# Patient Record
Sex: Male | Born: 1940 | Race: White | Hispanic: No | Marital: Married | Smoking: Former smoker
Health system: Southern US, Community
[De-identification: ages and names within clinical notes are randomized; demographics above are authoritative.]

## PROBLEM LIST (undated history)

## (undated) DIAGNOSIS — K259 Gastric ulcer, unspecified as acute or chronic, without hemorrhage or perforation: Secondary | ICD-10-CM

## (undated) DIAGNOSIS — I2699 Other pulmonary embolism without acute cor pulmonale: Secondary | ICD-10-CM

## (undated) DIAGNOSIS — G4733 Obstructive sleep apnea (adult) (pediatric): Secondary | ICD-10-CM

## (undated) DIAGNOSIS — R7303 Prediabetes: Secondary | ICD-10-CM

## (undated) DIAGNOSIS — L570 Actinic keratosis: Secondary | ICD-10-CM

## (undated) DIAGNOSIS — R609 Edema, unspecified: Secondary | ICD-10-CM

## (undated) DIAGNOSIS — J302 Other seasonal allergic rhinitis: Secondary | ICD-10-CM

## (undated) DIAGNOSIS — Z9989 Dependence on other enabling machines and devices: Secondary | ICD-10-CM

## (undated) DIAGNOSIS — E785 Hyperlipidemia, unspecified: Secondary | ICD-10-CM

## (undated) DIAGNOSIS — I2724 Chronic thromboembolic pulmonary hypertension: Secondary | ICD-10-CM

## (undated) HISTORY — DX: Obstructive sleep apnea (adult) (pediatric): G47.33

## (undated) HISTORY — DX: Obstructive sleep apnea (adult) (pediatric): Z99.89

## (undated) HISTORY — DX: Edema, unspecified: R60.9

## (undated) HISTORY — DX: Chronic thromboembolic pulmonary hypertension: I27.24

## (undated) HISTORY — DX: Prediabetes: R73.03

## (undated) HISTORY — DX: Other pulmonary embolism without acute cor pulmonale: I26.99

## (undated) HISTORY — PX: TONSILLECTOMY: SUR1361

## (undated) HISTORY — DX: Actinic keratosis: L57.0

## (undated) HISTORY — DX: Other seasonal allergic rhinitis: J30.2

## (undated) HISTORY — DX: Hyperlipidemia, unspecified: E78.5

---

## 2013-05-08 DIAGNOSIS — G4733 Obstructive sleep apnea (adult) (pediatric): Secondary | ICD-10-CM | POA: Insufficient documentation

## 2015-01-13 DIAGNOSIS — R609 Edema, unspecified: Secondary | ICD-10-CM | POA: Insufficient documentation

## 2015-01-13 DIAGNOSIS — R739 Hyperglycemia, unspecified: Secondary | ICD-10-CM | POA: Insufficient documentation

## 2015-01-13 DIAGNOSIS — N200 Calculus of kidney: Secondary | ICD-10-CM | POA: Insufficient documentation

## 2015-01-19 ENCOUNTER — Encounter (INDEPENDENT_AMBULATORY_CARE_PROVIDER_SITE_OTHER): Payer: Self-pay

## 2015-01-19 ENCOUNTER — Ambulatory Visit (INDEPENDENT_AMBULATORY_CARE_PROVIDER_SITE_OTHER): Payer: Medicare Other | Admitting: Internal Medicine

## 2015-01-19 ENCOUNTER — Encounter: Payer: Self-pay | Admitting: Internal Medicine

## 2015-01-19 VITALS — BP 104/72 | HR 76 | Temp 97.5°F | Ht 62.0 in | Wt 234.6 lb

## 2015-01-19 DIAGNOSIS — R0609 Other forms of dyspnea: Secondary | ICD-10-CM

## 2015-01-19 DIAGNOSIS — G4733 Obstructive sleep apnea (adult) (pediatric): Secondary | ICD-10-CM

## 2015-01-19 DIAGNOSIS — R0602 Shortness of breath: Secondary | ICD-10-CM

## 2015-01-19 DIAGNOSIS — Z9989 Dependence on other enabling machines and devices: Secondary | ICD-10-CM

## 2015-01-19 DIAGNOSIS — E669 Obesity, unspecified: Secondary | ICD-10-CM

## 2015-01-19 NOTE — Patient Instructions (Signed)
Shortness of breath - we will follow up stress test, cardiac echo and chest xray - wt loss with healthy diet and exercise as tolerated - f\u with Dr. Stevenson Clinch in 2 weeks

## 2015-01-19 NOTE — Progress Notes (Signed)
Date: 01/19/2015  MRN# [id number] Bryne Lindon 12-01-1941  Referring Physician:  PMD - Dr. Ouida Sills [facility])  Adam Knight is a 74 y.o. old male seen in consultation for shortness of breath  CC:  Chief Complaint  Patient presents with  . Advice Only    Self referral Pt moved here from [place] 3 months ago. Pt having sob with exhertion, non-productive cough. Pt denies wheezing. Pt does wear cpap at night.    HPI:  Patient has been having shortness of breath with exertion with stair climbing (2-3 flight), can walk about 15-20 yards before shortness of breath. SOB worsening over the last month.  Really noticed it this past week while cleaning up the snow.  Has had some weight gain over the past 6 months. Has notice leg swelling over the past month also. Patient also has a mild non productive cough.  Patient has a history of sleep apnea and is currently on cpap (since 2005), last sleep study was 5 years ago.  He does have a remote cardiac history (told that his heart doesn't pump blood out well) and is currently on Aldactone. He has followed with his primary care for the sob and leg swelling and is scheduled for a stress test, cardiac echo and chest xray.  Hx of lung nodule few year ago, had surveillance CT done, and was told that there was nothing further to do.  Patient recently moved to the area is just establishing with providers.  Patient has seen his PMD (Dr. Frazier Richards) and has a stress test\cardiac echo and chest xray scheduled.   PMHX:   Past Medical History  Diagnosis Date  . Edema   . OSA on CPAP   . Prediabetes   . Dyslipidemia   . Seasonal allergies    Surgical Hx:  Past Surgical History  Procedure Laterality Date  . Tonsillectomy     Family Hx:  Family History  Problem Relation Age of Onset  . Stroke Mother   . Coronary artery disease Mother   . Osteoporosis Mother   . Rheum arthritis Mother   . Thyroid disease Mother    Social Hx:    History  Substance Use Topics  . Smoking status: Former Smoker -- 0.30 packs/day for 5 years    Types: Cigarettes  . Smokeless tobacco: Never Used  . Alcohol Use: No   Medication:   Current Outpatient Rx  Name  Route  Sig  Dispense  Refill  . calcium carbonate, dosed in mg elemental calcium, 1250 (500 CA) MG/5ML   Oral   Take 500 mg of elemental calcium by mouth daily with breakfast.         . Cholecalciferol (VITAMIN D) 2000 UNITS tablet   Oral   Take 2,000 Units by mouth daily.         . Coenzyme Q10 (COQ10) 100 MG CAPS   Oral   Take 100 mg by mouth 2 (two) times daily.         Marland Kitchen spironolactone (ALDACTONE) 25 MG tablet   Oral   Take 25 mg by mouth 2 (two) times daily.             Allergies:  Penicillins and Sulfa antibiotics  Review of Systems: Gen:  Denies  fever, sweats, chills HEENT: Denies blurred vision, double vision, ear pain, eye pain, hearing loss, nose bleeds, sore throat Cvc:  No dizziness, chest pain or heaviness Resp:   Doe, non productive cough Gi: Denies swallowing difficulty, stomach  pain, nausea or vomiting, diarrhea, constipation, bowel incontinence Gu:  Denies bladder incontinence, burning urine Ext:   No Joint pain, stiffness or swelling Skin: No skin rash, easy bruising or bleeding or hives Endoc:  No polyuria, polydipsia , polyphagia or weight change Psych: No depression, insomnia or hallucinations  Other:  All other systems negative  Physical Examination:   VS: BP 104/72 mmHg  Pulse 76  Temp(Src) 97.5 F (36.4 C) (Oral)  Ht 5\' 2"  (1.575 m)  Wt 234 lb 9.6 oz (106.414 kg)  BMI 42.90 kg/m2  SpO2 92%  General Appearance: No distress  Neuro:without focal findings, mental status, speech normal, alert and oriented, cranial nerves 2-12 intact, reflexes normal and symmetric, sensation grossly normal  HEENT: PERRLA, EOM intact, no ptosis, no other lesions noticed; Mallampati 3 Pulmonary: good respiratory ;   Sputum Production:   none CardiovascularNormal S1,S2.  No m/r/g.  Abdominal aorta pulsation normal.    Abdomen: Benign, Soft, non-tender, No masses, hepatosplenomegaly, No lymphadenopathy Renal:  No costovertebral tenderness  GU:  No performed at this time. Endoc: No evident thyromegaly, no signs of acromegaly or Cushing features Skin:   warm, no rashes, no ecchymosis  Extremities: normal, no cyanosis, no clubbing, non pitting edema     Rad results: none available     Assessment and Plan: Obesity OBESITY  Discussed importance of weight reduction.  Educated regarding limitation of  intake of greasy/fried foods.  Instructed on benefit of  a low-impact exercise program, starting slowly.  Discussed benefits of 30-45 minutes of some form of exercise daily as well as benefit of supervised exercise program.      OSA on CPAP OSA   Encouraged proper weight management.  Excessive weight may contribute to snoring.  Monitor sedative use.  Discussed driving precautions and its relationship with hypersomnolence.  Discussed operating dangerous equipment and its relationship with hypersomnolence.  Discussed sleep hygiene, and benefits of a fixed sleep waked time.  The importance of getting eight or more hours of sleep discussed with patient.  Discussed limiting the use of the computer and television before bedtime.  Decrease naps during the day, so night time sleep will become enhanced.  Limit caffeine, and sleep deprivation.  HTN, stroke, and heart failure are potential risk factors.     Plan: Obtain records, if unable to obtain records, will then plan for a split night study.      DOE (dyspnea on exertion) Multifactorial - obesity, OSA, possible cardiac  Plan: - follow up stress test, cardiac echo and chest xray - PFTs, 6MWT - wt loss with healthy diet and exercise as tolerated - obtain cpap records, if available, otherwise will plan for split night study.  - f\u with Dr. Stevenson Clinch in 2  weeks     Updated Medication List Outpatient Encounter Prescriptions as of 01/19/2015  Medication Sig  . calcium carbonate, dosed in mg elemental calcium, 1250 (500 CA) MG/5ML Take 500 mg of elemental calcium by mouth daily with breakfast.  . Cholecalciferol (VITAMIN D) 2000 UNITS tablet Take 2,000 Units by mouth daily.  . Coenzyme Q10 (COQ10) 100 MG CAPS Take 100 mg by mouth 2 (two) times daily.  Marland Kitchen spironolactone (ALDACTONE) 25 MG tablet Take 25 mg by mouth 2 (two) times daily.    Orders for this visit: Orders Placed This Encounter  Procedures  . Pulmonary function test    Standing Status: Future     Number of Occurrences: 1     Standing Expiration Date: 01/20/2016  Order Specific Question:  Where should this test be performed?    Answer:  [facility]    Order Specific Question:  Full PFT: includes the following: basic spirometry, spirometry pre & post bronchodilator, diffusion capacity (DLCO), lung volumes    Answer:  Full PFT    Order Specific Question:  MIP/MEP    Answer:  No    Order Specific Question:  6 minute walk    Answer:  No    Order Specific Question:  ABG    Answer:  No    Order Specific Question:  Diffusion capacity (DLCO)    Answer:  Yes    Order Specific Question:  Lung volumes    Answer:  Yes    Order Specific Question:  Methacholine challenge    Answer:  No     Thank  you for the consultation and for allowing [facility] to assist in the care of your patient. Our recommendations are noted above.  Please contact us if we can be of further service.   Vilinda Boehringer, MD [facility] Office Number: [phone]

## 2015-01-26 ENCOUNTER — Ambulatory Visit (INDEPENDENT_AMBULATORY_CARE_PROVIDER_SITE_OTHER): Payer: Self-pay | Admitting: Internal Medicine

## 2015-01-26 DIAGNOSIS — E669 Obesity, unspecified: Secondary | ICD-10-CM | POA: Insufficient documentation

## 2015-01-26 DIAGNOSIS — R0609 Other forms of dyspnea: Secondary | ICD-10-CM

## 2015-01-26 DIAGNOSIS — G4733 Obstructive sleep apnea (adult) (pediatric): Secondary | ICD-10-CM | POA: Insufficient documentation

## 2015-01-26 DIAGNOSIS — Z9989 Dependence on other enabling machines and devices: Secondary | ICD-10-CM

## 2015-01-26 DIAGNOSIS — R0602 Shortness of breath: Secondary | ICD-10-CM

## 2015-01-26 NOTE — Assessment & Plan Note (Signed)
OBESITY  Discussed importance of weight reduction.  Educated regarding limitation of  intake of greasy/fried foods.  Instructed on benefit of  a low-impact exercise program, starting slowly.  Discussed benefits of 30-45 minutes of some form of exercise daily as well as benefit of supervised exercise program.    

## 2015-01-26 NOTE — Assessment & Plan Note (Signed)
OSA   Encouraged proper weight management.  Excessive weight may contribute to snoring.  Monitor sedative use.  Discussed driving precautions and its relationship with hypersomnolence.  Discussed operating dangerous equipment and its relationship with hypersomnolence.  Discussed sleep hygiene, and benefits of a fixed sleep waked time.  The importance of getting eight or more hours of sleep discussed with patient.  Discussed limiting the use of the computer and television before bedtime.  Decrease naps during the day, so night time sleep will become enhanced.  Limit caffeine, and sleep deprivation.  HTN, stroke, and heart failure are potential risk factors.     Plan: Obtain records, if unable to obtain records, will then plan for a split night study.

## 2015-01-26 NOTE — Assessment & Plan Note (Addendum)
Multifactorial - obesity, OSA, possible cardiac  Plan: - follow up stress test, cardiac echo and chest xray - PFTs, 6MWT - wt loss with healthy diet and exercise as tolerated - obtain cpap records, if available, otherwise will plan for split night study.  - f\u with Dr. Stevenson Clinch in 2 weeks

## 2015-01-26 NOTE — Progress Notes (Signed)
Pt came in today for his PFT. He was very uncooperative during the test. While giving him instructions he wanted to argue about he couldn't breathe with the nose clip on. Advised pt that if he could not perform basic spirometry then we could not complete the rest of the test. He didn't seem phased about this and said, "whatever." No charge will be filed for this visit.

## 2015-02-02 ENCOUNTER — Ambulatory Visit: Payer: No Typology Code available for payment source | Admitting: Internal Medicine

## 2015-04-20 DIAGNOSIS — Z6841 Body Mass Index (BMI) 40.0 and over, adult: Secondary | ICD-10-CM | POA: Insufficient documentation

## 2015-04-20 DIAGNOSIS — M1712 Unilateral primary osteoarthritis, left knee: Secondary | ICD-10-CM | POA: Insufficient documentation

## 2015-04-20 DIAGNOSIS — M1711 Unilateral primary osteoarthritis, right knee: Secondary | ICD-10-CM | POA: Insufficient documentation

## 2016-03-01 DIAGNOSIS — R0609 Other forms of dyspnea: Secondary | ICD-10-CM

## 2016-03-01 DIAGNOSIS — E6609 Other obesity due to excess calories: Secondary | ICD-10-CM | POA: Insufficient documentation

## 2016-03-01 DIAGNOSIS — I739 Peripheral vascular disease, unspecified: Secondary | ICD-10-CM | POA: Insufficient documentation

## 2016-06-01 DIAGNOSIS — E782 Mixed hyperlipidemia: Secondary | ICD-10-CM | POA: Insufficient documentation

## 2016-06-01 DIAGNOSIS — F320 Major depressive disorder, single episode, mild: Secondary | ICD-10-CM | POA: Insufficient documentation

## 2016-06-01 DIAGNOSIS — F32A Depression, unspecified: Secondary | ICD-10-CM | POA: Insufficient documentation

## 2017-08-22 ENCOUNTER — Encounter: Payer: Self-pay | Admitting: Internal Medicine

## 2017-10-25 DIAGNOSIS — I890 Lymphedema, not elsewhere classified: Secondary | ICD-10-CM | POA: Insufficient documentation

## 2017-11-10 DIAGNOSIS — M170 Bilateral primary osteoarthritis of knee: Secondary | ICD-10-CM | POA: Insufficient documentation

## 2017-12-12 ENCOUNTER — Other Ambulatory Visit: Payer: Self-pay | Admitting: Specialist

## 2018-01-06 ENCOUNTER — Other Ambulatory Visit: Payer: Self-pay

## 2018-01-06 ENCOUNTER — Ambulatory Visit
Admission: RE | Admit: 2018-01-06 | Discharge: 2018-01-06 | Disposition: A | Discharge status: Home / Self Care | Payer: Medicare Other | Source: Ambulatory Visit | Attending: Internal Medicine | Admitting: Internal Medicine

## 2018-01-06 ENCOUNTER — Encounter: Payer: Self-pay | Admitting: Internal Medicine

## 2018-01-06 ENCOUNTER — Inpatient Hospital Stay: Admit: 2018-01-06 | Payer: Medicare Other

## 2018-01-06 ENCOUNTER — Other Ambulatory Visit
Admission: RE | Admit: 2018-01-06 | Discharge: 2018-01-06 | Disposition: A | Discharge status: Home / Self Care | Payer: Medicare Other | Source: Ambulatory Visit | Attending: Internal Medicine | Admitting: Internal Medicine

## 2018-01-06 ENCOUNTER — Ambulatory Visit (INDEPENDENT_AMBULATORY_CARE_PROVIDER_SITE_OTHER): Payer: Medicare Other | Admitting: Internal Medicine

## 2018-01-06 ENCOUNTER — Inpatient Hospital Stay
Admission: EM | Admit: 2018-01-06 | Discharge: 2018-01-09 | DRG: 164 | Disposition: A | Discharge status: Home / Self Care | Payer: Medicare Other | Attending: Internal Medicine | Admitting: Internal Medicine

## 2018-01-06 ENCOUNTER — Inpatient Hospital Stay
Admit: 2018-01-06 | Discharge: 2018-01-06 | Disposition: A | Discharge status: Home / Self Care | Payer: Medicare Other | Attending: Internal Medicine | Admitting: Internal Medicine

## 2018-01-06 VITALS — BP 122/74 | HR 107 | Ht 62.0 in | Wt 226.0 lb

## 2018-01-06 DIAGNOSIS — R0609 Other forms of dyspnea: Secondary | ICD-10-CM

## 2018-01-06 DIAGNOSIS — N183 Chronic kidney disease, stage 3 (moderate): Secondary | ICD-10-CM | POA: Diagnosis present

## 2018-01-06 DIAGNOSIS — Z7951 Long term (current) use of inhaled steroids: Secondary | ICD-10-CM

## 2018-01-06 DIAGNOSIS — I272 Pulmonary hypertension, unspecified: Secondary | ICD-10-CM | POA: Diagnosis present

## 2018-01-06 DIAGNOSIS — E785 Hyperlipidemia, unspecified: Secondary | ICD-10-CM | POA: Diagnosis present

## 2018-01-06 DIAGNOSIS — E669 Obesity, unspecified: Secondary | ICD-10-CM | POA: Diagnosis present

## 2018-01-06 DIAGNOSIS — Z87891 Personal history of nicotine dependence: Secondary | ICD-10-CM

## 2018-01-06 DIAGNOSIS — Z880 Allergy status to penicillin: Secondary | ICD-10-CM | POA: Diagnosis not present

## 2018-01-06 DIAGNOSIS — I2782 Chronic pulmonary embolism: Secondary | ICD-10-CM | POA: Diagnosis present

## 2018-01-06 DIAGNOSIS — Z6841 Body Mass Index (BMI) 40.0 and over, adult: Secondary | ICD-10-CM

## 2018-01-06 DIAGNOSIS — I2609 Other pulmonary embolism with acute cor pulmonale: Principal | ICD-10-CM | POA: Diagnosis present

## 2018-01-06 DIAGNOSIS — Z8262 Family history of osteoporosis: Secondary | ICD-10-CM | POA: Diagnosis not present

## 2018-01-06 DIAGNOSIS — I2781 Cor pulmonale (chronic): Secondary | ICD-10-CM | POA: Diagnosis present

## 2018-01-06 DIAGNOSIS — E7800 Pure hypercholesterolemia, unspecified: Secondary | ICD-10-CM | POA: Diagnosis present

## 2018-01-06 DIAGNOSIS — R0602 Shortness of breath: Secondary | ICD-10-CM

## 2018-01-06 DIAGNOSIS — Z823 Family history of stroke: Secondary | ICD-10-CM | POA: Diagnosis not present

## 2018-01-06 DIAGNOSIS — R0902 Hypoxemia: Secondary | ICD-10-CM | POA: Diagnosis present

## 2018-01-06 DIAGNOSIS — J449 Chronic obstructive pulmonary disease, unspecified: Secondary | ICD-10-CM | POA: Diagnosis present

## 2018-01-06 DIAGNOSIS — R600 Localized edema: Secondary | ICD-10-CM | POA: Diagnosis present

## 2018-01-06 DIAGNOSIS — R7303 Prediabetes: Secondary | ICD-10-CM | POA: Diagnosis present

## 2018-01-06 DIAGNOSIS — Z8249 Family history of ischemic heart disease and other diseases of the circulatory system: Secondary | ICD-10-CM

## 2018-01-06 DIAGNOSIS — I129 Hypertensive chronic kidney disease with stage 1 through stage 4 chronic kidney disease, or unspecified chronic kidney disease: Secondary | ICD-10-CM | POA: Diagnosis present

## 2018-01-06 DIAGNOSIS — I2699 Other pulmonary embolism without acute cor pulmonale: Secondary | ICD-10-CM | POA: Diagnosis present

## 2018-01-06 DIAGNOSIS — Z8261 Family history of arthritis: Secondary | ICD-10-CM

## 2018-01-06 DIAGNOSIS — K219 Gastro-esophageal reflux disease without esophagitis: Secondary | ICD-10-CM | POA: Diagnosis present

## 2018-01-06 DIAGNOSIS — Z882 Allergy status to sulfonamides status: Secondary | ICD-10-CM

## 2018-01-06 DIAGNOSIS — Z9989 Dependence on other enabling machines and devices: Secondary | ICD-10-CM | POA: Diagnosis not present

## 2018-01-06 DIAGNOSIS — Z8349 Family history of other endocrine, nutritional and metabolic diseases: Secondary | ICD-10-CM | POA: Diagnosis not present

## 2018-01-06 DIAGNOSIS — I2602 Saddle embolus of pulmonary artery with acute cor pulmonale: Secondary | ICD-10-CM | POA: Diagnosis not present

## 2018-01-06 DIAGNOSIS — G4733 Obstructive sleep apnea (adult) (pediatric): Secondary | ICD-10-CM | POA: Diagnosis present

## 2018-01-06 HISTORY — DX: Gastric ulcer, unspecified as acute or chronic, without hemorrhage or perforation: K25.9

## 2018-01-06 LAB — CBC WITH DIFFERENTIAL/PLATELET
Basophils Absolute: 0 10*3/uL (ref 0–0.1)
Basophils Relative: 1 %
EOS PCT: 1 %
Eosinophils Absolute: 0.1 10*3/uL (ref 0–0.7)
HCT: 50.6 % (ref 40.0–52.0)
Hemoglobin: 17.1 g/dL (ref 13.0–18.0)
LYMPHS ABS: 1 10*3/uL (ref 1.0–3.6)
LYMPHS PCT: 13 %
MCH: 30.6 pg (ref 26.0–34.0)
MCHC: 33.8 g/dL (ref 32.0–36.0)
MCV: 90.5 fL (ref 80.0–100.0)
MONO ABS: 0.6 10*3/uL (ref 0.2–1.0)
MONOS PCT: 8 %
Neutro Abs: 6.2 10*3/uL (ref 1.4–6.5)
Neutrophils Relative %: 77 %
PLATELETS: 333 10*3/uL (ref 150–440)
RBC: 5.59 MIL/uL (ref 4.40–5.90)
RDW: 15.3 % — ABNORMAL HIGH (ref 11.5–14.5)
WBC: 7.9 10*3/uL (ref 3.8–10.6)

## 2018-01-06 LAB — COMPREHENSIVE METABOLIC PANEL
ALT: 114 U/L — AB (ref 17–63)
AST: 90 U/L — ABNORMAL HIGH (ref 15–41)
Albumin: 3.7 g/dL (ref 3.5–5.0)
Alkaline Phosphatase: 78 U/L (ref 38–126)
Anion gap: 12 (ref 5–15)
BUN: 25 mg/dL — ABNORMAL HIGH (ref 6–20)
CALCIUM: 8.8 mg/dL — AB (ref 8.9–10.3)
CHLORIDE: 107 mmol/L (ref 101–111)
CO2: 21 mmol/L — ABNORMAL LOW (ref 22–32)
CREATININE: 1.25 mg/dL — AB (ref 0.61–1.24)
GFR, EST NON AFRICAN AMERICAN: 54 mL/min — AB (ref >60)
Glucose, Bld: 109 mg/dL — ABNORMAL HIGH (ref 65–99)
Potassium: 4.5 mmol/L (ref 3.5–5.1)
Sodium: 140 mmol/L (ref 135–145)
Total Bilirubin: 0.9 mg/dL (ref 0.3–1.2)
Total Protein: 6.3 g/dL — ABNORMAL LOW (ref 6.5–8.1)

## 2018-01-06 LAB — APTT: aPTT: 25 seconds (ref 24–36)

## 2018-01-06 LAB — PROTIME-INR
INR: 1.05
PROTHROMBIN TIME: 13.6 s (ref 11.4–15.2)

## 2018-01-06 LAB — TROPONIN I

## 2018-01-06 LAB — POCT I-STAT CREATININE: Creatinine, Ser: 1.3 mg/dL — ABNORMAL HIGH (ref 0.61–1.24)

## 2018-01-06 MED ORDER — SODIUM CHLORIDE 0.9% FLUSH: 3.0000 mL | INTRAVENOUS | Status: DC | PRN

## 2018-01-06 MED ORDER — HEPARIN (PORCINE) IN NACL 100-0.45 UNIT/ML-% IJ SOLN
1350.0000 [IU]/h | INTRAMUSCULAR | Status: DC
  Administered 2018-01-06 – 2018-01-07 (×2): 1350 [IU]/h via INTRAVENOUS
  Filled 2018-01-06 (×2): qty 250

## 2018-01-06 MED ORDER — HEPARIN SODIUM (PORCINE) 5000 UNIT/ML IJ SOLN: 4000.0000 [IU] | Freq: Once | INTRAMUSCULAR | Status: DC

## 2018-01-06 MED ORDER — SPIRONOLACTONE 25 MG PO TABS: 25.0000 mg | ORAL_TABLET | Freq: Two times a day (BID) | ORAL | Status: DC

## 2018-01-06 MED ORDER — ETHACRYNIC ACID 25 MG PO TABS: 25.0000 mg | ORAL_TABLET | Freq: Two times a day (BID) | ORAL | 5 refills | Status: DC

## 2018-01-06 MED ORDER — SODIUM CHLORIDE 0.9% FLUSH
3.0000 mL | Freq: Two times a day (BID) | INTRAVENOUS | Status: DC
  Administered 2018-01-06 – 2018-01-08 (×3): 3 mL via INTRAVENOUS

## 2018-01-06 MED ORDER — HYDROCODONE-ACETAMINOPHEN 5-325 MG PO TABS
1.0000 | ORAL_TABLET | ORAL | Status: DC | PRN
  Filled 2018-01-06: qty 2

## 2018-01-06 MED ORDER — ETHACRYNIC ACID 25 MG PO TABS
25.0000 mg | ORAL_TABLET | Freq: Two times a day (BID) | ORAL | Status: DC
  Filled 2018-01-06 (×4): qty 1

## 2018-01-06 MED ORDER — ONDANSETRON HCL 4 MG/2ML IJ SOLN: 4.0000 mg | Freq: Four times a day (QID) | INTRAMUSCULAR | Status: DC | PRN

## 2018-01-06 MED ORDER — BISACODYL 5 MG PO TBEC: 5.0000 mg | DELAYED_RELEASE_TABLET | Freq: Every day | ORAL | Status: DC | PRN

## 2018-01-06 MED ORDER — FLUTICASONE FUROATE-VILANTEROL 100-25 MCG/INH IN AEPB: 1.0000 | INHALATION_SPRAY | Freq: Every day | RESPIRATORY_TRACT | 0 refills | Status: DC

## 2018-01-06 MED ORDER — SODIUM CHLORIDE 0.9 % IV SOLN: 250.0000 mL | INTRAVENOUS | Status: DC | PRN

## 2018-01-06 MED ORDER — FLUTICASONE FUROATE-VILANTEROL 100-25 MCG/INH IN AEPB
1.0000 | INHALATION_SPRAY | Freq: Every day | RESPIRATORY_TRACT | Status: DC
  Filled 2018-01-06: qty 28

## 2018-01-06 MED ORDER — IOPAMIDOL (ISOVUE-370) INJECTION 76%
75.0000 mL | Freq: Once | INTRAVENOUS | Status: AC | PRN
  Administered 2018-01-06: 75 mL via INTRAVENOUS

## 2018-01-06 MED ORDER — HEPARIN BOLUS VIA INFUSION
4700.0000 [IU] | Freq: Once | INTRAVENOUS | Status: AC
  Administered 2018-01-06: 4700 [IU] via INTRAVENOUS
  Filled 2018-01-06: qty 4700

## 2018-01-06 MED ORDER — ONDANSETRON HCL 4 MG PO TABS: 4.0000 mg | ORAL_TABLET | Freq: Four times a day (QID) | ORAL | Status: DC | PRN

## 2018-01-06 MED ORDER — ACETAMINOPHEN 650 MG RE SUPP: 650.0000 mg | Freq: Four times a day (QID) | RECTAL | Status: DC | PRN

## 2018-01-06 MED ORDER — ALBUTEROL SULFATE (2.5 MG/3ML) 0.083% IN NEBU: 2.5000 mg | INHALATION_SOLUTION | RESPIRATORY_TRACT | Status: DC | PRN

## 2018-01-06 MED ORDER — ETHACRYNIC ACID 25 MG PO TABS
25.0000 mg | ORAL_TABLET | Freq: Two times a day (BID) | ORAL | Status: DC
  Filled 2018-01-06: qty 1

## 2018-01-06 MED ORDER — HEPARIN (PORCINE) IN NACL 100-0.45 UNIT/ML-% IJ SOLN: 10.0000 [IU]/kg/h | Freq: Once | INTRAMUSCULAR | Status: DC

## 2018-01-06 MED ORDER — SENNOSIDES-DOCUSATE SODIUM 8.6-50 MG PO TABS: 1.0000 | ORAL_TABLET | Freq: Every evening | ORAL | Status: DC | PRN

## 2018-01-06 MED ORDER — ACETAMINOPHEN 325 MG PO TABS
650.0000 mg | ORAL_TABLET | Freq: Four times a day (QID) | ORAL | Status: DC | PRN
  Administered 2018-01-06: 650 mg via ORAL
  Filled 2018-01-06: qty 2

## 2018-01-06 NOTE — Patient Instructions (Addendum)
--  Keep pets out of bedroom.  --start additional diuretic twice daily.  --Check blood work in 2 weeks.   --Wear your lower extremity stockings as prescribed.   --Start Breo inhaler one puff once daily, rinse mouth after use.

## 2018-01-06 NOTE — Addendum Note (Signed)
Addended by: Maryanna Shape A on: 01/06/2018 12:42 PM   Modules accepted: Orders

## 2018-01-06 NOTE — ED Triage Notes (Addendum)
Pt sent  Over from [facility] pulmonary, ct scan positive for bilateral PE, short of breath for three yrs worse today.

## 2018-01-06 NOTE — Progress Notes (Addendum)
[facility] Consultation      Assessment and Plan:  Addendum:  D/w [facility] radiology, CT chest shows acute PE. 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 Pt was called and referred to ED for treatment.  -DR.    Dyspnea on exertion of uncertain etiology. -Progressive dyspnea on exertion for 3 years in duration.  Multiple testing thus far has been negative.  The patient has significant lower extremity lymphedema, currently on diuretics. -Patient was given an empiric Brio inhaler to be used daily for the next 1 month, to treat for potential asthmatic reaction.  He is also asked to keep his cats out of his bedroom, which are currently sleeping in his bed. -We will send for CT chest to rule out thromboembolic disease as a potential cause of his dyspnea.  Lymphedema. -Patient has 3+ bilateral lower extremity edema, potentially this could contribute to his dyspnea. -He is asked to use his previously prescribed lower extremity stockings to reduce the edema. - I am also prescribing ethacrynic acid, (sulfa allergy) to be used twice daily, we will recheck a chemistry panel in 2 weeks.  Patient will be following up in 4 weeks.  -We discussed that if all testing and interventions are negative and we will likely left with morbid obesity and restrictive lung disease from abdominal obesity as the only cause for his progressive dyspnea.  Orders Placed This Encounter  Procedures  . CT ANGIO CHEST PE W OR WO CONTRAST  . Basic Metabolic Panel (BMET)   Meds ordered this encounter  Medications  . ethacrynic acid (EDECRIN) 25 MG tablet    Sig: Take 1 tablet (25 mg total) by mouth 2 (two) times daily.    Dispense:  60 tablet    Refill:  5  . fluticasone furoate-vilanterol (BREO ELLIPTA) 100-25 MCG/INH AEPB    Sig: Inhale 1 puff into the lungs daily for 1 day.    Dispense:  14 each    Refill:  0    Order Specific Question:    Manufacturer?    Answer:   [employer] [[id number]]    Order Specific Question:   Quantity    Answer:   2   Return in about 1 month (around 02/06/2018).    Date: 01/06/2018  MRN# [id number] Braedyn Kauk November 26, 1941  Referring Physician:  Self referral.   Santonio Speakman is a 77 y.o. old male seen in consultation for chief complaint of:    Chief Complaint  Patient presents with  . PULMONARY CONSULT    pt reports of sob with exertion, occ non prod cough, wheezing & feet edema x3y    HPI:   Patient is a 77 year old male with a history of chronic edema, obstructive sleep apnea, peripheral vascular disease, active smoking.  He had been seeing Dr. Raul Del due to progressive dyspnea on exertion.  He was sent for CT chest and echocardiogram.  He previously been seen by Dr. Stevenson Clinch here in 2016 with similar complaints.  He notes that he has been having trouble breathing when pushing a lawnmower up a hill, this started about 3 years ago. This has been progressive, currently he carries a bag of cat litter and will find himself gasping for air.  He has not been on any inhalers in the past. He is not on oxygen.  He has 3 cats at home, they sleep in bed with him.  He does not exercise, he used to walk half  hour per day and stopped 2 months ago because of dyspnea LE edema. H saw a vascular doctor in [facility] and had an [place] and was told to use stockings.  He last smoked about 50 years ago. His dad smoked.  He worked [employer], he lived in [place].  Denies reflux.  Denies sinus drainage.  He uses CPAP every day.   **Spirometry 08/22/17; FVC is 116% predicted, FEV1 is 111% predicted, ratio is 74%.  No bronchodilator was given. Flow volume loop is normal. -Overall assist test is consistent with normal pulmonary functions.  **Chest x-ray report 08/22/17, images not available chronic bronchitic changes, interstitial prominence.  Enlarged pulmonary arteries, otherwise no acute  changes.  **Echocardiogram 04/23/17 pulmonary:TRIVIAL PR          No PS INTERPRETATION NORMAL LEFT VENTRICULAR SYSTOLIC FUNCTION WITH AN ESTIMATED EF = 55 % NORMAL RIGHT VENTRICULAR SYSTOLIC FUNCTION MILD TRICUSPID VALVE INSUFFICIENCY TRIVIAL MITRAL AND AORTIC VALVE INSUFFICIENCY NO VALVULAR STENOSIS MILD RV ENLARGEMENT  **Desat walk 12/12/17 SPO2 At Rest On Room Air: 90% SPO2 With Exertion On Room Air: 87% SPO2 On Oxygen 2 LPM With Exertion: 92%    PMHX:   Past Medical History:  Diagnosis Date  . Dyslipidemia   . Edema   . OSA on CPAP   . Prediabetes   . Seasonal allergies    Surgical Hx:  Past Surgical History:  Procedure Laterality Date  . TONSILLECTOMY     Family Hx:  Family History  Problem Relation Age of Onset  . Stroke Mother   . Coronary artery disease Mother   . Osteoporosis Mother   . Rheum arthritis Mother   . Thyroid disease Mother    Social Hx:   Social History   Tobacco Use  . Smoking status: Former Smoker    Packs/day: 0.30    Years: 5.00    Pack years: 1.50    Types: Cigarettes  . Smokeless tobacco: Never Used  Substance Use Topics  . Alcohol use: No    Alcohol/week: 0.0 oz  . Drug use: No   Medication:    Current Outpatient Medications:  .  calcium carbonate, dosed in mg elemental calcium, 1250 (500 CA) MG/5ML, Take 500 mg of elemental calcium by mouth daily with breakfast., Disp: , Rfl:  .  Cholecalciferol (VITAMIN D) 2000 UNITS tablet, Take 2,000 Units by mouth daily., Disp: , Rfl:  .  Coenzyme Q10 (COQ10) 100 MG CAPS, Take 100 mg by mouth 2 (two) times daily., Disp: , Rfl:  .  spironolactone (ALDACTONE) 25 MG tablet, Take 25 mg by mouth 2 (two) times daily., Disp: , Rfl:    Allergies:  Penicillins and Sulfa antibiotics  Review of Systems: Gen:  Denies  fever, sweats, chills HEENT: Denies blurred vision, double vision. bleeds, sore throat Cvc:  No dizziness, chest pain. Resp:   Denies cough or sputum production. Gi:  Denies swallowing difficulty, stomach pain. Gu:  Denies bladder incontinence, burning urine Ext:   No Joint pain, stiffness. Skin: No skin rash,  hives  Endoc:  No polyuria, polydipsia. Psych: No depression, insomnia. Other:  All other systems were reviewed with the patient and were negative other that what is mentioned in the HPI.   Physical Examination:   VS: BP 122/74 (BP Location: Left Arm, Cuff Size: Normal)   Pulse (!) 107   Ht 5\' 2"  (1.575 m)   Wt 226 lb (102.5 kg)   SpO2 93%   BMI 41.34 kg/m   General  Appearance: No distress  Neuro:without focal findings,  speech normal,  HEENT: PERRLA, EOM intact.   Pulmonary: normal breath sounds, No wheezing.  CardiovascularNormal S1,S2.  No m/r/g.   Abdomen: Benign, Soft, non-tender. Renal:  No costovertebral tenderness  GU:  No performed at this time. Endoc: No evident thyromegaly, no signs of acromegaly. Skin:   warm, no rashes, no ecchymosis  Extremities: normal, no cyanosis, clubbing.  Other findings:    LABORATORY PANEL:   CBC No results for input(s): WBC, HGB, HCT, PLT in the last 168 hours. ------------------------------------------------------------------------------------------------------------------  Chemistries  No results for input(s): NA, K, CL, CO2, GLUCOSE, BUN, CREATININE, CALCIUM, MG, AST, ALT, ALKPHOS, BILITOT in the last 168 hours.  Invalid input(s): GFRCGP ------------------------------------------------------------------------------------------------------------------  Cardiac Enzymes No results for input(s): TROPONINI in the last 168 hours. ------------------------------------------------------------  RADIOLOGY:  No results found.     Thank  you for the consultation and for allowing [facility] to assist in the care of your patient. Our recommendations are noted above.  Please contact us if we can be of further service.   Marda Stalker, MD.  Board Certified in  Internal Medicine, Pulmonary Medicine, [place], and Sleep Medicine.   Pulmonary and Critical Care Office Number: [phone]  Patricia Pesa, M.D.  Merton Border, M.D  01/06/2018

## 2018-01-06 NOTE — ED Provider Notes (Addendum)
[facility] Emergency Department Provider Note   ____________________________________________   First MD Initiated Contact with Patient 01/06/18 1530     (approximate)  I have reviewed the triage vital signs and the nursing notes.   HISTORY  Chief Complaint Shortness of Breath    HPI Adam Knight is a 77 y.o. male Patient sent from a bar pulmonology CT showed bilateral PEs acute on chronic increasing shortness of breath for 3 years worse today. CT showing right heart strain with the acute on chronic PEs   Past Medical History:  Diagnosis Date  . Dyslipidemia   . Edema   . OSA on CPAP   . Prediabetes   . Seasonal allergies     Patient Active Problem List   Diagnosis Date Noted  . DOE (dyspnea on exertion) 01/26/2015  . OSA on CPAP 01/26/2015  . Obesity 01/26/2015    Past Surgical History:  Procedure Laterality Date  . TONSILLECTOMY      Prior to Admission medications   Medication Sig Start Date End Date Taking? Authorizing Provider  calcium carbonate, dosed in mg elemental calcium, 1250 (500 CA) MG/5ML Take 500 mg of elemental calcium by mouth daily with breakfast.    [provider]  Cholecalciferol (VITAMIN D) 2000 UNITS tablet Take 2,000 Units by mouth daily.    [provider]  Coenzyme Q10 (COQ10) 100 MG CAPS Take 100 mg by mouth 2 (two) times daily.    [provider]  ethacrynic acid (EDECRIN) 25 MG tablet Take 1 tablet (25 mg total) by mouth 2 (two) times daily. 01/06/18   Laverle Hobby, MD  fluticasone furoate-vilanterol (BREO ELLIPTA) 100-25 MCG/INH AEPB Inhale 1 puff into the lungs daily for 1 day. 01/06/18 01/07/18  Laverle Hobby, MD  spironolactone (ALDACTONE) 25 MG tablet Take 25 mg by mouth 2 (two) times daily.    [provider]    Allergies Penicillins and Sulfa antibiotics  Family History  Problem Relation Age of Onset  . Stroke Mother   . Coronary artery  disease Mother   . Osteoporosis Mother   . Rheum arthritis Mother   . Thyroid disease Mother     Social History Social History   Tobacco Use  . Smoking status: Former Smoker    Packs/day: 0.30    Years: 5.00    Pack years: 1.50    Types: Cigarettes  . Smokeless tobacco: Never Used  Substance Use Topics  . Alcohol use: No    Alcohol/week: 0.0 oz  . Drug use: No    Review of Systems  Constitutional: No fever/chills Eyes: No visual changes. ENT: No sore throat. Cardiovascular: Denies chest pain. Respiratoryshortness of breath. Gastrointestinal: No abdominal pain.  No nausea, no vomiting.  No diarrhea.  No constipation. Genitourinary: Negative for dysuria. Musculoskeletal: Negative for back pain. Skin: Negative for rash. Neurological: Negative for headaches, focal weakness ____________________________________________   PHYSICAL EXAM:  VITAL SIGNS: ED Triage Vitals [01/06/18 1518]  Enc Vitals Group     BP (!) 144/93     Pulse      Resp 20     Temp 97.7 F (36.5 C)     Temp Source Oral     SpO2 92 %     Weight 226 lb (102.5 kg)     Height      Head Circumference      Peak Flow      Pain Score 5     Pain Loc  Pain Edu?      Excl. in [place]?     Constitutional: Alert and oriented. Well appearing and in no acute distress. Eyes: Conjunctivae are normal.  Head: Atraumatic. Nose: No congestion/rhinnorhea. Mouth/Throat: Mucous membranes are moist.  Oropharynx non-erythematous. Neck: No stridor.   Cardiovascular: Normal rate, regular rhythm. Grossly normal heart sounds.  Good peripheral circulation. Respiratory: Normal respiratory effort.  No retractions. Lungs CTAB. Gastrointestinal: Soft and nontender. No distention. No abdominal bruits. No CVA tenderness. \Musculoskeletal: No lower extremity tenderness bilateral edema.  No joint effusions. Neurologic:  Normal speech and language. No gross focal neurologic deficits are appreciated.  Skin:  Skin is warm, dry  and intact. No rash noted. Psychiatric: Mood and affect are normal. Speech and behavior are normal.  ____________________________________________   LABS (all labs ordered are listed, but only abnormal results are displayed)  Labs Reviewed  CBC WITH DIFFERENTIAL/PLATELET - Abnormal; Notable for the following components:      Result Value   RDW 15.3 (*)    All other components within normal limits  COMPREHENSIVE METABOLIC PANEL  PROTIME-INR  APTT  TROPONIN I   ____________________________________________  EKG  EKG read and interpreted by me shows normal sinus rhythm rate of 87 left axis flipped T's throughout the precordial leads and inferiorly and in the leads right bundle branch block ____________________________________________  RADIOLOGY  Ct Angio Chest Pe W Or Wo Contrast  Result Date: 01/06/2018 CLINICAL DATA:  Shortness of breath for 3 years which has worsened over the past week. EXAM: CT ANGIOGRAPHY CHEST WITH CONTRAST TECHNIQUE: Multidetector CT imaging of the chest was performed using the standard protocol during bolus administration of intravenous contrast. Multiplanar CT image reconstructions and MIPs were obtained to evaluate the vascular anatomy. CONTRAST:  75 ml ISOVUE-370 IOPAMIDOL (ISOVUE-370) INJECTION 76% COMPARISON:  None. FINDINGS: Cardiovascular: The examination is positive for bilateral pulmonary emboli. Several of the emboli are adherent to the vessel wall compatible with chronic change but others appear to occlude the vessel lumen suggesting acute PE. The RV/LV ratio is 1.6 consistent with right heart strain. Note is made that the main pulmonary artery measures slightly larger than the ascending aorta suggestive of pulmonary arterial hypertension. Heart size is normal. No pericardial effusion. No notable calcific atherosclerosis. Mediastinum/Nodes: No enlarged mediastinal, hilar, or axillary lymph nodes. Thyroid gland, trachea, and esophagus demonstrate no  significant findings. Lungs/Pleura: No pleural effusion. A solitary right lower lobe nodule measures 0.5 cm on image 40 of series 6. No other nodules identified. Upper Abdomen: Parapelvic right renal cyst noted. Musculoskeletal: No acute or focal abnormality. Review of the MIP images confirms the above findings. IMPRESSION: The study is positive for pulmonary embolus. 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 Right heart strain may be chronic without evidence of pulmonary arterial hypertension present on the examination. 0.5 cm right lower lobe pulmonary nodule. No follow-up needed if patient is low-risk. Non-contrast chest CT can be considered in 12 months if patient is high-risk. This recommendation follows the consensus statement: Guidelines for Management of Incidental Pulmonary Nodules Detected on CT Images: From the Fleischner Society 2017; Radiology 2017; 284:228-243. Critical Value/emergent results were called by telephone at the time of interpretation on 01/06/2018 at 2:44 pm to Dr. Laverle Hobby , who verbally acknowledged these results. Electronically Signed   By: Inge Rise M.D.   On: 01/06/2018 14:51    ____________________________________________   PROCEDURES  Procedure(s) performed:   Procedures  Critical Care performed:   ____________________________________________  INITIAL IMPRESSION / ASSESSMENT AND PLAN / ED COURSE  patient has acute on chronic pulmonary emboli and right heart strain on the CT scan. We'll plan on admitting him.      ____________________________________________   FINAL CLINICAL IMPRESSION(S) / ED DIAGNOSES  Final diagnoses:  Other acute pulmonary embolism with acute cor pulmonale (HCC)  other diagnosis is chronic pulmonary emboli with cor pulmonale   ED Discharge Orders    None       Note:  This document was prepared using Dragon voice recognition software and may include unintentional dictation errors.      Nena Polio, MD 01/06/18 1549    Nena Polio, MD 01/06/18 [phone]

## 2018-01-06 NOTE — Addendum Note (Signed)
Addended by: Maryanna Shape A on: 01/06/2018 12:57 PM   Modules accepted: Orders

## 2018-01-06 NOTE — Progress Notes (Signed)
[place] for Heparin Indication: pulmonary embolus  Allergies  Allergen Reactions  . Penicillins Itching  . Sulfa Antibiotics Itching    Patient Measurements: Weight: 226 lb (102.5 kg) Heparin Dosing Weight: 75 kg  Vital Signs: Temp: 97.7 F (36.5 C) (01/14 1518) Temp Source: Oral (01/14 1518) BP: 144/93 (01/14 1518) Pulse Rate: 107 (01/14 1143)  Labs: Recent Labs    01/06/18 1354 01/06/18 1522  HGB  --  17.1  HCT  --  50.6  PLT  --  333  CREATININE 1.30*  --     Estimated Creatinine Clearance: 50.5 mL/min (A) (by C-G formula based on SCr of 1.3 mg/dL (H)).   Medical History: Past Medical History:  Diagnosis Date  . Dyslipidemia   . Edema   . OSA on CPAP   . Prediabetes   . Seasonal allergies    Assessment: 77 y/o M admitted from pulmonology office with PE. Patient not on anticoagulation PTA.   Goal of Therapy:  Heparin level 0.3-0.7 units/ml Monitor platelets by anticoagulation protocol: Yes   Plan:  Give 4700 units bolus x 1 Start heparin infusion at 1350 units/hr Check anti-Xa level in 6 hours and daily while on heparin Continue to monitor H&H and platelets  Ulice Dash D 01/06/2018,3:54 PM

## 2018-01-06 NOTE — H&P (Addendum)
[place] [facility] [place] NAME: Adam Knight    MR#:  [id number]  DATE OF BIRTH:  April 03, 1941  DATE OF ADMISSION:  01/06/2018  PRIMARY CARE PHYSICIAN: Gearldine Shown, DO   REQUESTING/REFERRING PHYSICIAN: Dr. Rip Harbour  CHIEF COMPLAINT:   Chief Complaint  Patient presents with  . Shortness of Breath   Worsening shortness of breath. HISTORY OF PRESENT ILLNESS:  Adam Knight  is a 78 y.o. male with a known history of chronic leg edema, OSA and hyperlipidemia.  The patient has had shortness of breath for the past 3 years.  He is following up for pulmonary physician as outpatient due to worsening shortness of breath recently.  CT angiogram of the chest show acute on chronic pulmonary embolism today.  Dr. Felicie Morn sent the patient to ED for further evaluation and treatment.  The patient has worsening shortness of breath on exertion but denies any chest pain, wheezing or fever or chills.  PAST MEDICAL HISTORY:   Past Medical History:  Diagnosis Date  . Dyslipidemia   . Edema   . Gastric ulcer   . OSA on CPAP   . Prediabetes   . Seasonal allergies     PAST SURGICAL HISTORY:   Past Surgical History:  Procedure Laterality Date  . TONSILLECTOMY      SOCIAL HISTORY:   Social History   Tobacco Use  . Smoking status: Former Smoker    Packs/day: 0.30    Years: 5.00    Pack years: 1.50    Types: Cigarettes  . Smokeless tobacco: Never Used  Substance Use Topics  . Alcohol use: No    Alcohol/week: 0.0 oz    FAMILY HISTORY:   Family History  Problem Relation Age of Onset  . Stroke Mother   . Coronary artery disease Mother   . Osteoporosis Mother   . Rheum arthritis Mother   . Thyroid disease Mother     DRUG ALLERGIES:   Allergies  Allergen Reactions  . Penicillins Itching    Has patient had a PCN reaction causing immediate rash, facial/tongue/throat swelling, SOB or lightheadedness with hypotension:  Yes Has patient had a PCN reaction causing severe rash involving mucus membranes or skin necrosis: No Has patient had a PCN reaction that required hospitalization: No Has patient had a PCN reaction occurring within the last 10 years: No If all of the above answers are "NO", then may proceed with Cephalosporin use.  . Sulfa Antibiotics Itching    REVIEW OF SYSTEMS:   Review of Systems  Constitutional: Negative for chills, fever and malaise/fatigue.  HENT: Negative for sore throat.   Eyes: Negative for blurred vision and double vision.  Respiratory: Positive for cough and shortness of breath. Negative for hemoptysis, wheezing and stridor.   Cardiovascular: Positive for leg swelling. Negative for chest pain, palpitations and orthopnea.  Gastrointestinal: Negative for abdominal pain, blood in stool, diarrhea, melena, nausea and vomiting.  Genitourinary: Negative for dysuria, flank pain and hematuria.  Musculoskeletal: Negative for back pain and joint pain.  Skin: Negative for rash.  Neurological: Negative for dizziness, sensory change, focal weakness, seizures, loss of consciousness, weakness and headaches.  Endo/Heme/Allergies: Negative for polydipsia.  Psychiatric/Behavioral: Negative for depression. The patient is not nervous/anxious.     MEDICATIONS AT HOME:   Prior to Admission medications   Medication Sig Start Date End Date Taking? Authorizing Provider  calcium carbonate, dosed in mg elemental calcium, 1250 (500 CA) MG/5ML Take 500 mg of  elemental calcium by mouth daily with breakfast.   Yes [provider]  Cholecalciferol (VITAMIN D) 2000 UNITS tablet Take 2,000 Units by mouth daily.   Yes [provider]  fluticasone furoate-vilanterol (BREO ELLIPTA) 100-25 MCG/INH AEPB Inhale 1 puff into the lungs daily for 1 day. 01/06/18 01/07/18 Yes Laverle Hobby, MD  spironolactone (ALDACTONE) 25 MG tablet Take 25 mg by mouth 2 (two) times daily.   Yes [provider]  ethacrynic acid (EDECRIN) 25 MG tablet Take 1 tablet (25 mg total) by mouth 2 (two) times daily. Patient not taking: Reported on 01/06/2018 01/06/18   Laverle Hobby, MD      VITAL SIGNS:  Blood pressure (!) 144/93, temperature 97.7 F (36.5 C), temperature source Oral, resp. rate 20, weight 226 lb (102.5 kg), SpO2 92 %.  PHYSICAL EXAMINATION:  Physical Exam  GENERAL:  77 y.o.-year-old patient lying in the bed with no acute distress.  Obesity. EYES: Pupils equal, round, reactive to light and accommodation. No scleral icterus. Extraocular muscles intact.  HEENT: Head atraumatic, normocephalic. Oropharynx and nasopharynx clear.  NECK:  Supple, no jugular venous distention. No thyroid enlargement, no tenderness.  LUNGS: Normal breath sounds bilaterally, no wheezing, rales,rhonchi or crepitation. No use of accessory muscles of respiration.  CARDIOVASCULAR: S1, S2 normal. No murmurs, rubs, or gallops.  ABDOMEN: Soft, nontender, nondistended. Bowel sounds present. No organomegaly or mass.  EXTREMITIES: No cyanosis, or clubbing.  Bilateral leg edema 2+, no calf tenderness. NEUROLOGIC: Cranial nerves II through XII are intact. Muscle strength 5/5 in all extremities. Sensation intact. Gait not checked.  PSYCHIATRIC: The patient is alert and oriented x 3.  SKIN: No obvious rash, lesion, or ulcer.   LABORATORY PANEL:   CBC Recent Labs  Lab 01/06/18 1522  WBC 7.9  HGB 17.1  HCT 50.6  PLT 333   ------------------------------------------------------------------------------------------------------------------  Chemistries  Recent Labs  Lab 01/06/18 1354  CREATININE 1.30*   ------------------------------------------------------------------------------------------------------------------  Cardiac Enzymes No results for input(s): TROPONINI in the last 168  hours. ------------------------------------------------------------------------------------------------------------------  RADIOLOGY:  Ct Angio Chest Pe W Or Wo Contrast  Result Date: 01/06/2018 CLINICAL DATA:  Shortness of breath for 3 years which has worsened over the past week. EXAM: CT ANGIOGRAPHY CHEST WITH CONTRAST TECHNIQUE: Multidetector CT imaging of the chest was performed using the standard protocol during bolus administration of intravenous contrast. Multiplanar CT image reconstructions and MIPs were obtained to evaluate the vascular anatomy. CONTRAST:  75 ml ISOVUE-370 IOPAMIDOL (ISOVUE-370) INJECTION 76% COMPARISON:  None. FINDINGS: Cardiovascular: The examination is positive for bilateral pulmonary emboli. Several of the emboli are adherent to the vessel wall compatible with chronic change but others appear to occlude the vessel lumen suggesting acute PE. The RV/LV ratio is 1.6 consistent with right heart strain. Note is made that the main pulmonary artery measures slightly larger than the ascending aorta suggestive of pulmonary arterial hypertension. Heart size is normal. No pericardial effusion. No notable calcific atherosclerosis. Mediastinum/Nodes: No enlarged mediastinal, hilar, or axillary lymph nodes. Thyroid gland, trachea, and esophagus demonstrate no significant findings. Lungs/Pleura: No pleural effusion. A solitary right lower lobe nodule measures 0.5 cm on image 40 of series 6. No other nodules identified. Upper Abdomen: Parapelvic right renal cyst noted. Musculoskeletal: No acute or focal abnormality. Review of the MIP images confirms the above findings. IMPRESSION: The study is positive for pulmonary embolus. 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 Right heart strain may be chronic without evidence of pulmonary arterial hypertension present on  the examination. 0.5 cm right lower lobe pulmonary nodule. No follow-up needed if patient is low-risk. Non-contrast  chest CT can be considered in 12 months if patient is high-risk. This recommendation follows the consensus statement: Guidelines for Management of Incidental Pulmonary Nodules Detected on CT Images: From the Fleischner Society 2017; Radiology 2017; 284:228-243. Critical Value/emergent results were called by telephone at the time of interpretation on 01/06/2018 at 2:44 pm to Dr. Laverle Hobby , who verbally acknowledged these results. Electronically Signed   By: Inge Rise M.D.   On: 01/06/2018 14:51      IMPRESSION AND PLAN:   Acute on chronic pulmonary embolism Patient will be admitted to medical floor. Start heparin drip, follow-up echocardiogram and lower extremity venous duplex.  May change to p.o. anticoagulation 24-48 hours later.  Mild renal insufficiency, unclear acute or chronic.  Follow-up BMP. OSA and obesity.  Continue CPAP at night. Chronic bilateral leg edema.  Continue diuretics. History of gastric ulcer per patient.  All the records are reviewed and case discussed with ED provider. Management plans discussed with the patient, family and they are in agreement.  CODE STATUS: Full code  TOTAL TIME TAKING CARE OF THIS PATIENT: 56 minutes.    Demetrios Loll M.D on 01/06/2018 at 4:09 PM  Between 7am to 6pm - Pager - [phone]  After 6pm go to [email or web address] - password [email or web address] [facility]  [facility]  Office  [phone]  CC: Primary care physician; Gearldine Shown, DO   Note: This dictation was prepared with Dragon dictation along with smaller phrase technology. Any transcriptional errors that result from this process are unin

## 2018-01-06 NOTE — ED Notes (Signed)
Heparin witnessed by Jaci Carrel, RN

## 2018-01-07 ENCOUNTER — Inpatient Hospital Stay: Payer: Medicare Other

## 2018-01-07 DIAGNOSIS — I2602 Saddle embolus of pulmonary artery with acute cor pulmonale: Secondary | ICD-10-CM

## 2018-01-07 LAB — ECHOCARDIOGRAM COMPLETE
HEIGHTINCHES: 62 in
Weight: 3633.18 oz

## 2018-01-07 LAB — HEPARIN LEVEL (UNFRACTIONATED)
HEPARIN UNFRACTIONATED: 0.55 [IU]/mL (ref 0.30–0.70)
Heparin Unfractionated: 0.48 IU/mL (ref 0.30–0.70)

## 2018-01-07 MED ORDER — APIXABAN 5 MG PO TABS: 5.0000 mg | ORAL_TABLET | Freq: Two times a day (BID) | ORAL | Status: DC

## 2018-01-07 MED ORDER — SPIRONOLACTONE 25 MG PO TABS
50.0000 mg | ORAL_TABLET | Freq: Every day | ORAL | Status: DC
  Administered 2018-01-07 – 2018-01-09 (×2): 50 mg via ORAL
  Filled 2018-01-07 (×2): qty 2

## 2018-01-07 MED ORDER — SPIRONOLACTONE 25 MG PO TABS: 50.0000 mg | ORAL_TABLET | Freq: Every day | ORAL | Status: DC

## 2018-01-07 MED ORDER — HEPARIN (PORCINE) IN NACL 100-0.45 UNIT/ML-% IJ SOLN
1150.0000 [IU]/h | INTRAMUSCULAR | Status: DC
  Filled 2018-01-07: qty 250

## 2018-01-07 MED ORDER — PANTOPRAZOLE SODIUM 40 MG PO TBEC
40.0000 mg | DELAYED_RELEASE_TABLET | Freq: Every day | ORAL | Status: DC
  Administered 2018-01-07: 40 mg via ORAL
  Filled 2018-01-07 (×2): qty 1

## 2018-01-07 MED ORDER — APIXABAN 5 MG PO TABS: 10.0000 mg | ORAL_TABLET | Freq: Two times a day (BID) | ORAL | Status: DC

## 2018-01-07 MED ORDER — CLINDAMYCIN PHOSPHATE 300 MG/50ML IV SOLN
300.0000 mg | INTRAVENOUS | Status: AC
Start: 2018-01-08 — End: 2018-01-08
  Administered 2018-01-08 (×2): 300 mg via INTRAVENOUS
  Filled 2018-01-07: qty 50

## 2018-01-07 MED ORDER — SALINE SPRAY 0.65 % NA SOLN
1.0000 | NASAL | Status: DC | PRN
  Filled 2018-01-07: qty 44

## 2018-01-07 NOTE — Progress Notes (Addendum)
[place] for Heparin Indication: pulmonary embolus  Allergies  Allergen Reactions  . Penicillins Itching    Has patient had a PCN reaction causing immediate rash, facial/tongue/throat swelling, SOB or lightheadedness with hypotension: Yes Has patient had a PCN reaction causing severe rash involving mucus membranes or skin necrosis: No Has patient had a PCN reaction that required hospitalization: No Has patient had a PCN reaction occurring within the last 10 years: No If all of the above answers are "NO", then may proceed with Cephalosporin use.  . Sulfa Antibiotics Itching    Patient Measurements: Height: 5\' 2"  (157.5 cm) Weight: 227 lb 1.2 oz (103 kg) IBW/kg (Calculated) : 54.6 Heparin Dosing Weight: 75 kg  Vital Signs: Temp: 97.3 F (36.3 C) (01/15 1222) Temp Source: Oral (01/15 0513) BP: 121/88 (01/15 1222) Pulse Rate: 84 (01/15 1222)  Labs: Recent Labs    01/06/18 1354 01/06/18 1522 01/06/18 1609 01/07/18 1413  HGB  --  17.1  --   --   HCT  --  50.6  --   --   PLT  --  333  --   --   APTT  --   --  25  --   LABPROT  --   --  13.6  --   INR  --   --  1.05  --   HEPARINUNFRC  --   --   --  0.55  CREATININE 1.30* 1.25*  --   --   TROPONINI  --  <0.03  --   --     Estimated Creatinine Clearance: 52.6 mL/min (A) (by C-G formula based on SCr of 1.25 mg/dL (H)).   Medical History: Past Medical History:  Diagnosis Date  . Dyslipidemia   . Edema   . Gastric ulcer   . OSA on CPAP   . Prediabetes   . Seasonal allergies    Assessment: 77 y/o M admitted from pulmonology office with PE. Patient not on anticoagulation PTA.   Goal of Therapy:  Heparin level 0.3-0.7 units/ml Monitor platelets by anticoagulation protocol: Yes   Plan:  1/15 1413 HL therapeutic at 0.55. Continue current rate of heparin 1350 u/hr and recheck confirmatory HL in 6 hours.   Pernell Dupre, PharmD, BCPS Clinical Pharmacist 01/07/2018 3:11  PM

## 2018-01-07 NOTE — Progress Notes (Signed)
Patient seen and examined  Patient has marked shortness of breath and limitation with ambulation.  CT angiogram demonstrates bilateral pulmonary emboli larger embolisms are located on the right.  Right heart strain is also noted.  I have discussed pulmonary angiography with combination mechanical and chemical thrombolysis.  I reviewed the risks and benefits.  I have discussed alternatives which largely are stand-alone anticoagulation.  Given this information the patient wishes to move forward with pulmonary angiography and possible thrombolysis.

## 2018-01-07 NOTE — Progress Notes (Signed)
[place] for Apixaban  Indication: pulmonary embolus  Allergies  Allergen Reactions  . Penicillins Itching    Has patient had a PCN reaction causing immediate rash, facial/tongue/throat swelling, SOB or lightheadedness with hypotension: Yes Has patient had a PCN reaction causing severe rash involving mucus membranes or skin necrosis: No Has patient had a PCN reaction that required hospitalization: No Has patient had a PCN reaction occurring within the last 10 years: No If all of the above answers are "NO", then may proceed with Cephalosporin use.  . Sulfa Antibiotics Itching    Patient Measurements: Height: 5\' 2"  (157.5 cm) Weight: 227 lb 1.2 oz (103 kg) IBW/kg (Calculated) : 54.6 Heparin Dosing Weight: 75 kg  Vital Signs: Temp: 97.7 F (36.5 C) (01/15 0513) Temp Source: Oral (01/15 0513) BP: 96/68 (01/15 0513) Pulse Rate: 75 (01/15 0513)  Labs: Recent Labs    01/06/18 1354 01/06/18 1522 01/06/18 1609  HGB  --  17.1  --   HCT  --  50.6  --   PLT  --  333  --   APTT  --   --  25  LABPROT  --   --  13.6  INR  --   --  1.05  CREATININE 1.30* 1.25*  --   TROPONINI  --  <0.03  --     Estimated Creatinine Clearance: 52.6 mL/min (A) (by C-G formula based on SCr of 1.25 mg/dL (H)).   Medical History: Past Medical History:  Diagnosis Date  . Dyslipidemia   . Edema   . Gastric ulcer   . OSA on CPAP   . Prediabetes   . Seasonal allergies    Assessment: 77 y/o M admitted from pulmonology office with PE. Patient not on anticoagulation PTA. Patient was started on heparin drip. Now pharmacy has been consulted for apixaban dosing.     Plan:  Heparin drip stopped.  Will start apixaban 10mg  BID x 7 days, followed by Apixaban 5mg  BID there after. Pharmacy counsel patient prior to discharge.   Pernell Dupre, PharmD, BCPS Clinical Pharmacist 01/07/2018 8:58 AM

## 2018-01-07 NOTE — Progress Notes (Signed)
* [facility]     Assessment and Plan:  Acute on chronic pulmonary embolism. Dyspnea on exertion. Pulmonary hypertension, chronic lower extremity edema may be due to underlying chronic thromboembolic disease.  - Continue anticoagulation. -Consulted vascular surgery to evaluate for thromboembolectomy, given chronic lower extremity edema and symptoms and signs of pulmonary hypertension.   Date: 01/07/2018  MRN# [id number] Adam Knight Nov 23, 1941   Adam Knight is a 77 y.o. old male seen in follow up for chief complaint of  Chief Complaint  Patient presents with  . Shortness of Breath     Subjective.  No new complaints today, patient continues to complain of dyspnea.  Medication:    Current Facility-Administered Medications:  .  0.9 %  sodium chloride infusion, 250 mL, Intravenous, PRN, Demetrios Loll, MD .  acetaminophen (TYLENOL) tablet 650 mg, 650 mg, Oral, Q6H PRN, 650 mg at 01/06/18 2148 **OR** acetaminophen (TYLENOL) suppository 650 mg, 650 mg, Rectal, Q6H PRN, Demetrios Loll, MD .  albuterol (PROVENTIL) (2.5 MG/3ML) 0.083% nebulizer solution 2.5 mg, 2.5 mg, Nebulization, Q2H PRN, Demetrios Loll, MD .  Derrill Memo ON 01/08/2018] apixaban (ELIQUIS) tablet 10 mg, 10 mg, Oral, BID **FOLLOWED BY** [START ON 01/15/2018] apixaban (ELIQUIS) tablet 5 mg, 5 mg, Oral, BID, Hallaji, Sheema M, RPH .  bisacodyl (DULCOLAX) EC tablet 5 mg, 5 mg, Oral, Daily PRN, Demetrios Loll, MD .  ethacrynic acid (EDECRIN) tablet 25 mg, 25 mg, Oral, BID, Demetrios Loll, MD .  fluticasone furoate-vilanterol (BREO ELLIPTA) 100-25 MCG/INH 1 puff, 1 puff, Inhalation, Daily, Demetrios Loll, MD .  HYDROcodone-acetaminophen (NORCO/VICODIN) 5-325 MG per tablet 1-2 tablet, 1-2 tablet, Oral, Q4H PRN, Demetrios Loll, MD .  ondansetron [facility]) tablet 4 mg, 4 mg, Oral, Q6H PRN **OR** ondansetron (ZOFRAN) injection 4 mg, 4 mg, Intravenous, Q6H PRN, Demetrios Loll, MD .  senna-docusate (Senokot-S) tablet 1 tablet, 1 tablet, Oral,  QHS PRN, Demetrios Loll, MD .  sodium chloride (OCEAN) 0.65 % nasal spray 1 spray, 1 spray, Each Nare, PRN, Demetrios Loll, MD .  sodium chloride flush (NS) 0.9 % injection 3 mL, 3 mL, Intravenous, Q12H, Demetrios Loll, MD, 3 mL at 01/06/18 2149 .  sodium chloride flush (NS) 0.9 % injection 3 mL, 3 mL, Intravenous, PRN, Demetrios Loll, MD .  spironolactone (ALDACTONE) tablet 50 mg, 50 mg, Oral, Daily, Sudini, Srikar, MD   Allergies:  Penicillins and Sulfa antibiotics  Review of Systems: Gen:  Denies  fever, sweats. HEENT: Denies blurred vision. Cvc:  No dizziness, chest pain or heaviness Resp:   Denies cough or sputum porduction. Gi: Denies swallowing difficulty, stomach pain. constipation, bowel incontinence Gu:  Denies bladder incontinence, burning urine Ext:   No Joint pain, stiffness. Skin: No skin rash, easy bruising. Endoc:  No polyuria, polydipsia. Psych: No depression, insomnia. Other:  All other systems were reviewed and found to be negative other than what is mentioned in the HPI.   Physical Examination:   VS: BP 96/68 (BP Location: Right Arm)   Pulse 75   Temp 97.7 F (36.5 C) (Oral)   Resp 20   Ht 5\' 2"  (1.575 m)   Wt 227 lb 1.2 oz (103 kg)   SpO2 90%   BMI 41.53 kg/m    General Appearance: No distress  Neuro:without focal findings,  speech normal,  HEENT: PERRLA, EOM intact. Pulmonary: normal breath sounds, No wheezing.   CardiovascularNormal S1,S2.  No m/r/g.   Abdomen: Benign, Soft, non-tender. Renal:  No costovertebral tenderness  GU:  Not performed  at this time. Endoc: No evident thyromegaly, no signs of acromegaly. Skin:   warm, no rash. Extremities: normal, no cyanosis, clubbing.   LABORATORY PANEL:   CBC Recent Labs  Lab 01/06/18 1522  WBC 7.9  HGB 17.1  HCT 50.6  PLT 333   ------------------------------------------------------------------------------------------------------------------  Chemistries  Recent Labs  Lab 01/06/18 1522  NA 140  K 4.5    CL 107  CO2 21*  GLUCOSE 109*  BUN 25*  CREATININE 1.25*  CALCIUM 8.8*  AST 90*  ALT 114*  ALKPHOS 78  BILITOT 0.9   ------------------------------------------------------------------------------------------------------------------  Cardiac Enzymes Recent Labs  Lab 01/06/18 1522  TROPONINI <0.03   ------------------------------------------------------------  RADIOLOGY:   No results found for this or any previous visit. No results found for this or any previous visit. ------------------------------------------------------------------------------------------------------------------  Thank  you for allowing [facility] to assist in the care of your patient. Our recommendations are noted above.  Please contact us if we can be of further service.   Marda Stalker, MD.  [facility] Office Number: [phone]  Patricia Pesa, M.D.  Merton Border, M.D  01/07/2018

## 2018-01-07 NOTE — Progress Notes (Signed)
[facility] NAME: Adam Knight    MR#:  [id number]  DATE OF BIRTH:  January 22, 1941  SUBJECTIVE:  CHIEF COMPLAINT:   Chief Complaint  Patient presents with  . Shortness of Breath   Still has SOB and LE edema No CP  No recent surgeries, road trips or flights journeys.  No history of blood clots.  REVIEW OF SYSTEMS:    Review of Systems  Constitutional: Positive for malaise/fatigue. Negative for chills and fever.  HENT: Negative for sore throat.   Eyes: Negative for blurred vision, double vision and pain.  Respiratory: Positive for cough and shortness of breath. Negative for hemoptysis and wheezing.   Cardiovascular: Positive for leg swelling. Negative for chest pain, palpitations and orthopnea.  Gastrointestinal: Negative for abdominal pain, constipation, diarrhea, heartburn, nausea and vomiting.  Genitourinary: Negative for dysuria and hematuria.  Musculoskeletal: Negative for back pain and joint pain.  Skin: Negative for rash.  Neurological: Positive for weakness. Negative for sensory change, speech change, focal weakness and headaches.  Endo/Heme/Allergies: Does not bruise/bleed easily.  Psychiatric/Behavioral: Negative for depression. The patient is not nervous/anxious.    DRUG ALLERGIES:   Allergies  Allergen Reactions  . Penicillins Itching    Has patient had a PCN reaction causing immediate rash, facial/tongue/throat swelling, SOB or lightheadedness with hypotension: Yes Has patient had a PCN reaction causing severe rash involving mucus membranes or skin necrosis: No Has patient had a PCN reaction that required hospitalization: No Has patient had a PCN reaction occurring within the last 10 years: No If all of the above answers are "NO", then may proceed with Cephalosporin use.  . Sulfa Antibiotics Itching    VITALS:  Blood pressure 121/88, pulse 84, temperature (!) 97.3 F (36.3 C), resp. rate 20, height 5\' 2"   (1.575 m), weight 103 kg (227 lb 1.2 oz), SpO2 95 %.  PHYSICAL EXAMINATION:   Physical Exam  GENERAL:  77 y.o.-year-old patient lying in the bed . obese EYES: Pupils equal, round, reactive to light and accommodation. No scleral icterus. Extraocular muscles intact.  HEENT: Head atraumatic, normocephalic. Oropharynx and nasopharynx clear.  NECK:  Supple, no jugular venous distention. No thyroid enlargement, no tenderness.  LUNGS: Normal breath sounds bilaterally, no wheezing, rales, rhonchi. No use of accessory muscles of respiration.  CARDIOVASCULAR: S1, S2 normal. No murmurs, rubs, or gallops.  ABDOMEN: Soft, nontender, nondistended. Bowel sounds present. No organomegaly or mass.  EXTREMITIES: B/L LE NEUROLOGIC: Cranial nerves II through XII are intact. No focal Motor or sensory deficits b/l.   PSYCHIATRIC: The patient is alert and oriented x 3.  SKIN: No obvious rash, lesion, or ulcer.   LABORATORY PANEL:   CBC Recent Labs  Lab 01/06/18 1522  WBC 7.9  HGB 17.1  HCT 50.6  PLT 333   ------------------------------------------------------------------------------------------------------------------ Chemistries  Recent Labs  Lab 01/06/18 1522  NA 140  K 4.5  CL 107  CO2 21*  GLUCOSE 109*  BUN 25*  CREATININE 1.25*  CALCIUM 8.8*  AST 90*  ALT 114*  ALKPHOS 78  BILITOT 0.9   -------------------------------------------------------------------------------------------------------------  Cardiac Enzymes Recent Labs  Lab 01/06/18 1522  TROPONINI <0.03   ------------------------------------------------------------------------------------------------------------- RADIOLOGY:  Ct Angio Chest Pe W Or Wo Contrast  Result Date: 01/06/2018 CLINICAL DATA:  Shortness of breath for 3 years which has worsened over the past week. EXAM: CT ANGIOGRAPHY CHEST WITH CONTRAST TECHNIQUE: Multidetector CT imaging of the chest was performed using the standard protocol during bolus  administration of intravenous contrast. Multiplanar CT image reconstructions and MIPs were obtained to evaluate the vascular anatomy. CONTRAST:  75 ml ISOVUE-370 IOPAMIDOL (ISOVUE-370) INJECTION 76% COMPARISON:  None. FINDINGS: Cardiovascular: The examination is positive for bilateral pulmonary emboli. Several of the emboli are adherent to the vessel wall compatible with chronic change but others appear to occlude the vessel lumen suggesting acute PE. The RV/LV ratio is 1.6 consistent with right heart strain. Note is made that the main pulmonary artery measures slightly larger than the ascending aorta suggestive of pulmonary arterial hypertension. Heart size is normal. No pericardial effusion. No notable calcific atherosclerosis. Mediastinum/Nodes: No enlarged mediastinal, hilar, or axillary lymph nodes. Thyroid gland, trachea, and esophagus demonstrate no significant findings. Lungs/Pleura: No pleural effusion. A solitary right lower lobe nodule measures 0.5 cm on image 40 of series 6. No other nodules identified. Upper Abdomen: Parapelvic right renal cyst noted. Musculoskeletal: No acute or focal abnormality. Review of the MIP images confirms the above findings. IMPRESSION: The study is positive for pulmonary embolus. 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 Right heart strain may be chronic without evidence of pulmonary arterial hypertension present on the examination. 0.5 cm right lower lobe pulmonary nodule. No follow-up needed if patient is low-risk. Non-contrast chest CT can be considered in 12 months if patient is high-risk. This recommendation follows the consensus statement: Guidelines for Management of Incidental Pulmonary Nodules Detected on CT Images: From the Fleischner Society 2017; Radiology 2017; 284:228-243. Critical Value/emergent results were called by telephone at the time of interpretation on 01/06/2018 at 2:44 pm to Dr. Laverle Hobby , who verbally acknowledged these  results. Electronically Signed   By: Inge Rise M.D.   On: 01/06/2018 14:51   US Venous Img Lower Bilateral  Result Date: 01/07/2018 CLINICAL DATA:  Pulmonary emboli, leg swelling, edema EXAM: BILATERAL LOWER EXTREMITY VENOUS DOPPLER ULTRASOUND TECHNIQUE: Gray-scale sonography with graded compression, as well as color Doppler and duplex ultrasound were performed to evaluate the lower extremity deep venous systems from the level of the common femoral vein and including the common femoral, femoral, profunda femoral, popliteal and calf veins including the posterior tibial, peroneal and gastrocnemius veins when visible. The superficial great saphenous vein was also interrogated. Spectral Doppler was utilized to evaluate flow at rest and with distal augmentation maneuvers in the common femoral, femoral and popliteal veins. COMPARISON:  None. FINDINGS: RIGHT LOWER EXTREMITY Common Femoral Vein: No evidence of thrombus. Normal compressibility, respiratory phasicity and response to augmentation. Saphenofemoral Junction: No evidence of thrombus. Normal compressibility and flow on color Doppler imaging. Profunda Femoral Vein: No evidence of thrombus. Normal compressibility and flow on color Doppler imaging. Femoral Vein: No evidence of thrombus. Normal compressibility, respiratory phasicity and response to augmentation. Popliteal Vein: No evidence of thrombus. Normal compressibility, respiratory phasicity and response to augmentation. Calf Veins: No evidence of thrombus. Normal compressibility and flow on color Doppler imaging. Superficial Great Saphenous Vein: No evidence of thrombus. Normal compressibility. Venous Reflux:  None. Other Findings:  None. LEFT LOWER EXTREMITY Common Femoral Vein: No evidence of thrombus. Normal compressibility, respiratory phasicity and response to augmentation. Saphenofemoral Junction: No evidence of thrombus. Normal compressibility and flow on color Doppler imaging. Profunda Femoral  Vein: No evidence of thrombus. Normal compressibility and flow on color Doppler imaging. Femoral Vein: No evidence of thrombus. Normal compressibility, respiratory phasicity and response to augmentation. Popliteal Vein: No evidence of thrombus. Normal compressibility, respiratory phasicity and response to augmentation. Calf Veins: No evidence of thrombus. Normal  compressibility and flow on color Doppler imaging. Superficial Great Saphenous Vein: No evidence of thrombus. Normal compressibility. Venous Reflux:  None. Other Findings:  None. IMPRESSION: No evidence of deep venous thrombosis. Electronically Signed   By: Jerilynn Mages.  Shick M.D.   On: 01/07/2018 10:07   ASSESSMENT AND PLAN:   * Acute bilateral pulmonary embolism with also chronic clot burden No prior history LE dopplers negative for DVT Discussed with Dr. Ashby Dawes of pulmonary.  Advising thromboembolectomy of pulmonary emboli.  Vascular consulted.  Patient waiting to hear back Continue heparin drip at this time.  Can transition to Eliquis if no procedures planned. Echocardiogram pending  *CKD stage III.  Stable  * OSA and obesity.  Continue CPAP at night.  * Chronic bilateral leg edema.  Continue diuretics.  * History of gastric ulcer per patient. Needs PPI for GI prophylaxis now that patient is on anticoagulation.  All the records are reviewed and case discussed with Care Management/Social Worker Management plans discussed with the patient, family and they are in agreement.  CODE STATUS: FULL CODE  DVT Prophylaxis: SCDs  TOTAL TIME TAKING CARE OF THIS PATIENT: 35 minutes.   POSSIBLE D/C IN 1-2 DAYS, DEPENDING ON CLINICAL CONDITION.  Neita Carp M.D on 01/07/2018 at 3:41 PM  Between 7am to 6pm - Pager - [phone]  After 6pm go to [email or web address] - password [email or web address] [facility]  Office  [phone]  CC: Primary care physician; Gearldine Shown, DO  Note: This dictation was prepared with  Dragon dictation along with smaller phrase technology. Any transcriptional errors that result from this process are unintentional.

## 2018-01-07 NOTE — Care Management Note (Addendum)
Case Management Note  Patient Details  Name: Adam Knight MRN: [id number] Date of Birth: 1941-09-26  Subjective/Objective:                 Admitted from home with acute  on chronic bilateral pulmonary embolus.  Independent in all adls, denies issues accessing medical care, obtaining medications or with transportation.  Current with  PCP.  Chronic home cpap. Currently is not requiring supplemental oxygen.  Patient does have part d coverage.  At present decision on which oral anticoagulant agent will be used. Vascular to consult for thromboembolectomy consideration. There is concern for pulmonary hypertension   Action/Plan:  Provide 30 day coupon if to discharge on cost prohibitive oral anticoagulant  Expected Discharge Date:                  Expected Discharge Plan:     In-House Referral:     Discharge planning Services     Post Acute Care Choice:    Choice offered to:     DME Arranged:    DME Agency:     HH Arranged:    HH Agency:     Status of Service:     If discussed at [employer], dates discussed:    Additional Comments:  Katrina Stack, RN 01/07/2018, 3:09 PM

## 2018-01-07 NOTE — Progress Notes (Signed)
[facility] for Heparin Indication: pulmonary embolus  Allergies  Allergen Reactions  . Penicillins Itching    Has patient had a PCN reaction causing immediate rash, facial/tongue/throat swelling, SOB or lightheadedness with hypotension: Yes Has patient had a PCN reaction causing severe rash involving mucus membranes or skin necrosis: No Has patient had a PCN reaction that required hospitalization: No Has patient had a PCN reaction occurring within the last 10 years: No If all of the above answers are "NO", then may proceed with Cephalosporin use.  . Sulfa Antibiotics Itching    Patient Measurements: Height: 5\' 2"  (157.5 cm) Weight: 227 lb 1.2 oz (103 kg) IBW/kg (Calculated) : 54.6 Heparin Dosing Weight: 75 kg  Vital Signs: Temp: 98.1 F (36.7 C) (01/15 2021) Temp Source: Oral (01/15 2021) BP: 129/80 (01/15 2021) Pulse Rate: 88 (01/15 2021)  Labs: Recent Labs    01/06/18 1354 01/06/18 1522 01/06/18 1609 01/07/18 1413 01/07/18 2042  HGB  --  17.1  --   --   --   HCT  --  50.6  --   --   --   PLT  --  333  --   --   --   APTT  --   --  25  --   --   LABPROT  --   --  13.6  --   --   INR  --   --  1.05  --   --   HEPARINUNFRC  --   --   --  0.55 0.48  CREATININE 1.30* 1.25*  --   --   --   TROPONINI  --  <0.03  --   --   --     Estimated Creatinine Clearance: 52.6 mL/min (A) (by C-G formula based on SCr of 1.25 mg/dL (H)).   Medical History: Past Medical History:  Diagnosis Date  . Dyslipidemia   . Edema   . Gastric ulcer   . OSA on CPAP   . Prediabetes   . Seasonal allergies    Assessment: 77 y/o M admitted from pulmonology office with PE. Patient not on anticoagulation PTA.   Goal of Therapy:  Heparin level 0.3-0.7 units/ml Monitor platelets by anticoagulation protocol: Yes   Plan:  1/15 1413 HL therapeutic at 0.55. Continue current rate of heparin 1350 u/hr and recheck confirmatory HL in 6 hours.   1/15 20:42 HL  = 0.48.   Will continue this pt on current rate and recheck HL on 1/16 with AM labs.   Orene Desanctis, PharmD Clinical Pharmacist 01/07/2018 9:35 PM

## 2018-01-08 ENCOUNTER — Encounter: Payer: Self-pay | Admitting: *Deleted

## 2018-01-08 ENCOUNTER — Encounter
Admission: EM | Disposition: A | Discharge status: Home / Self Care | Payer: Self-pay | Source: Home / Self Care | Attending: Internal Medicine

## 2018-01-08 DIAGNOSIS — R0602 Shortness of breath: Secondary | ICD-10-CM

## 2018-01-08 DIAGNOSIS — J449 Chronic obstructive pulmonary disease, unspecified: Secondary | ICD-10-CM

## 2018-01-08 DIAGNOSIS — I2699 Other pulmonary embolism without acute cor pulmonale: Secondary | ICD-10-CM

## 2018-01-08 HISTORY — PX: PULMONARY VENOGRAPHY: CATH118294

## 2018-01-08 LAB — CBC
HEMATOCRIT: 50.9 % (ref 40.0–52.0)
HEMOGLOBIN: 16.9 g/dL (ref 13.0–18.0)
MCH: 30.2 pg (ref 26.0–34.0)
MCHC: 33.3 g/dL (ref 32.0–36.0)
MCV: 90.8 fL (ref 80.0–100.0)
Platelets: 322 10*3/uL (ref 150–440)
RBC: 5.61 MIL/uL (ref 4.40–5.90)
RDW: 15.3 % — AB (ref 11.5–14.5)
WBC: 6 10*3/uL (ref 3.8–10.6)

## 2018-01-08 LAB — HEPARIN LEVEL (UNFRACTIONATED)
HEPARIN UNFRACTIONATED: 0.54 [IU]/mL (ref 0.30–0.70)
Heparin Unfractionated: 0.8 IU/mL — ABNORMAL HIGH (ref 0.30–0.70)

## 2018-01-08 SURGERY — PULMONARY VENOGRAPHY
Anesthesia: Moderate Sedation | Laterality: Bilateral

## 2018-01-08 MED ORDER — FENTANYL CITRATE (PF) 100 MCG/2ML IJ SOLN
INTRAMUSCULAR | Status: DC | PRN
  Administered 2018-01-08: 50 ug via INTRAVENOUS

## 2018-01-08 MED ORDER — IOPAMIDOL (ISOVUE-300) INJECTION 61%
INTRAVENOUS | Status: DC | PRN
  Administered 2018-01-08: 60 mL via INTRAVENOUS

## 2018-01-08 MED ORDER — SODIUM CHLORIDE 0.9 % IV SOLN
INTRAVENOUS | Status: DC
  Administered 2018-01-08: 15:00:00 via INTRAVENOUS

## 2018-01-08 MED ORDER — SODIUM CHLORIDE 0.9 % IV SOLN: 250.0000 mL | INTRAVENOUS | Status: DC | PRN

## 2018-01-08 MED ORDER — MIDAZOLAM HCL 5 MG/5ML IJ SOLN
INTRAMUSCULAR | Status: AC
  Filled 2018-01-08: qty 5

## 2018-01-08 MED ORDER — SODIUM CHLORIDE 0.9% FLUSH
3.0000 mL | Freq: Two times a day (BID) | INTRAVENOUS | Status: DC
  Administered 2018-01-08 – 2018-01-09 (×2): 3 mL via INTRAVENOUS

## 2018-01-08 MED ORDER — CLINDAMYCIN PHOSPHATE 300 MG/50ML IV SOLN
INTRAVENOUS | Status: AC
  Administered 2018-01-08: 300 mg via INTRAVENOUS
  Filled 2018-01-08: qty 50

## 2018-01-08 MED ORDER — HEPARIN SODIUM (PORCINE) 1000 UNIT/ML IJ SOLN
INTRAMUSCULAR | Status: AC
  Filled 2018-01-08: qty 1

## 2018-01-08 MED ORDER — ALTEPLASE 2 MG IJ SOLR
INTRAMUSCULAR | Status: AC
  Filled 2018-01-08: qty 8

## 2018-01-08 MED ORDER — ALTEPLASE 2 MG IJ SOLR
INTRAMUSCULAR | Status: DC | PRN
  Administered 2018-01-08: 12 mg

## 2018-01-08 MED ORDER — HEPARIN (PORCINE) IN NACL 100-0.45 UNIT/ML-% IJ SOLN
1300.0000 [IU]/h | INTRAMUSCULAR | Status: DC
  Administered 2018-01-08 – 2018-01-09 (×2): 1150 [IU]/h via INTRAVENOUS
  Filled 2018-01-08: qty 250

## 2018-01-08 MED ORDER — SODIUM CHLORIDE 0.9 % IV SOLN: INTRAVENOUS | Status: AC

## 2018-01-08 MED ORDER — SODIUM CHLORIDE 0.9% FLUSH: 3.0000 mL | INTRAVENOUS | Status: DC | PRN

## 2018-01-08 MED ORDER — HEPARIN SODIUM (PORCINE) 1000 UNIT/ML IJ SOLN
INTRAMUSCULAR | Status: DC | PRN
  Administered 2018-01-08: 4000 [IU] via INTRAVENOUS

## 2018-01-08 MED ORDER — FENTANYL CITRATE (PF) 100 MCG/2ML IJ SOLN
INTRAMUSCULAR | Status: AC
Start: 2018-01-08 — End: 2018-01-08
  Filled 2018-01-08: qty 2

## 2018-01-08 MED ORDER — MIDAZOLAM HCL 2 MG/2ML IJ SOLN
INTRAMUSCULAR | Status: DC | PRN
  Administered 2018-01-08: 2 mg via INTRAVENOUS

## 2018-01-08 SURGICAL SUPPLY — 18 items
CANISTER PENUMBRA MAX (MISCELLANEOUS) ×3 IMPLANT
CATH ANGIO 5F 100CM .035 PIG (CATHETERS) ×3 IMPLANT
CATH INDIGO 8 TORQ TIP 85CM (CATHETERS) ×3 IMPLANT
CATH INFINITI JR4 5F (CATHETERS) ×3 IMPLANT
COVER PROBE U/S 5X48 (MISCELLANEOUS) ×3 IMPLANT
DEVICE SAFEGUARD 24CM (GAUZE/BANDAGES/DRESSINGS) ×3 IMPLANT
DEVICE TORQUE (MISCELLANEOUS) ×3 IMPLANT
GUIDEWIRE ANGLED .035X260CM (WIRE) ×3 IMPLANT
NEEDLE ENTRY 21GA 7CM ECHOTIP (NEEDLE) ×3 IMPLANT
PACK ANGIOGRAPHY (CUSTOM PROCEDURE TRAY) ×3 IMPLANT
SET INTRO CAPELLA COAXIAL (SET/KITS/TRAYS/PACK) ×3 IMPLANT
SHEATH BRITE TIP 5FRX11 (SHEATH) ×3 IMPLANT
SHEATH BRITE TIP 8FRX11 (SHEATH) ×3 IMPLANT
SYR MEDRAD MARK V 150ML (SYRINGE) ×3 IMPLANT
TUBING ASPIRATION INDIGO (MISCELLANEOUS) ×3 IMPLANT
TUBING CONTRAST HIGH PRESS 72 (TUBING) ×3 IMPLANT
WIRE J 3MM .035X145CM (WIRE) ×3 IMPLANT
WIRE MAGIC TORQUE 260C (WIRE) ×3 IMPLANT

## 2018-01-08 NOTE — Progress Notes (Signed)
* [facility]     Assessment and Plan:  Acute on chronic pulmonary embolism. Dyspnea on exertion. Pulmonary hypertension, chronic lower extremity edema may be due to underlying chronic thromboembolic disease.  - Continue anticoagulation. -Consulted vascular surgery to evaluate for thromboembolectomy, given chronic lower extremity edema and symptoms and signs of pulmonary hypertension.   Date: 01/08/2018  MRN# [id number] Loxley Schmale 03/01/1941   Cirilo Canner is a 77 y.o. old male seen in follow up for chief complaint of  Chief Complaint  Patient presents with  . Shortness of Breath     Subjective.  No new complaints today, patient continues to complain of dyspnea.  Medication:    Current Facility-Administered Medications:  .  0.9 %  sodium chloride infusion, 250 mL, Intravenous, PRN, Demetrios Loll, MD .  acetaminophen (TYLENOL) tablet 650 mg, 650 mg, Oral, Q6H PRN, 650 mg at 01/06/18 2148 **OR** acetaminophen (TYLENOL) suppository 650 mg, 650 mg, Rectal, Q6H PRN, Demetrios Loll, MD .  albuterol (PROVENTIL) (2.5 MG/3ML) 0.083% nebulizer solution 2.5 mg, 2.5 mg, Nebulization, Q2H PRN, Demetrios Loll, MD .  bisacodyl (DULCOLAX) EC tablet 5 mg, 5 mg, Oral, Daily PRN, Demetrios Loll, MD .  clindamycin (CLEOCIN) IVPB 300 mg, 300 mg, Intravenous, On Call to OR, Schnier, Dolores Lory, MD .  ethacrynic acid (EDECRIN) tablet 25 mg, 25 mg, Oral, BID, Demetrios Loll, MD .  fluticasone furoate-vilanterol (BREO ELLIPTA) 100-25 MCG/INH 1 puff, 1 puff, Inhalation, Daily, Demetrios Loll, MD .  heparin ADULT infusion 100 units/mL (25000 units/229mL sodium chloride 0.45%), 1,150 Units/hr, Intravenous, Continuous, Sudini, Srikar, MD, Last Rate: 13.5 mL/hr at 01/08/18 0600, 1,350 Units/hr at 01/08/18 0600 .  HYDROcodone-acetaminophen (NORCO/VICODIN) 5-325 MG per tablet 1-2 tablet, 1-2 tablet, Oral, Q4H PRN, Demetrios Loll, MD .  ondansetron [facility]) tablet 4 mg, 4 mg, Oral, Q6H PRN **OR** ondansetron  (ZOFRAN) injection 4 mg, 4 mg, Intravenous, Q6H PRN, Demetrios Loll, MD .  pantoprazole (PROTONIX) EC tablet 40 mg, 40 mg, Oral, Daily, Sudini, Srikar, MD, 40 mg at 01/07/18 1823 .  senna-docusate (Senokot-S) tablet 1 tablet, 1 tablet, Oral, QHS PRN, Demetrios Loll, MD .  sodium chloride (OCEAN) 0.65 % nasal spray 1 spray, 1 spray, Each Nare, PRN, Demetrios Loll, MD .  sodium chloride flush (NS) 0.9 % injection 3 mL, 3 mL, Intravenous, Q12H, Demetrios Loll, MD, 3 mL at 01/08/18 1323 .  sodium chloride flush (NS) 0.9 % injection 3 mL, 3 mL, Intravenous, PRN, Demetrios Loll, MD .  spironolactone (ALDACTONE) tablet 50 mg, 50 mg, Oral, Daily, Sudini, Srikar, MD, 50 mg at 01/07/18 1052   Allergies:  Penicillins and Sulfa antibiotics  Review of Systems: Gen:  Denies  fever, sweats. HEENT: Denies blurred vision. Cvc:  No dizziness, chest pain or heaviness Resp:   Denies cough or sputum porduction. Gi: Denies swallowing difficulty, stomach pain. constipation, bowel incontinence Gu:  Denies bladder incontinence, burning urine Ext:   No Joint pain, stiffness. Skin: No skin rash, easy bruising. Endoc:  No polyuria, polydipsia. Psych: No depression, insomnia. Other:  All other systems were reviewed and found to be negative other than what is mentioned in the HPI.   Physical Examination:   VS: BP 136/78 (BP Location: Right Arm)   Pulse 72   Temp 97.6 F (36.4 C) (Oral)   Resp 18   Ht 5\' 2"  (1.575 m)   Wt 227 lb 1.2 oz (103 kg)   SpO2 94%   BMI 41.53 kg/m    General Appearance: No  distress  Neuro:without focal findings,  speech normal,  HEENT: PERRLA, EOM intact. Pulmonary: normal breath sounds, No wheezing.   CardiovascularNormal S1,S2.  No m/r/g.   Abdomen: Benign, Soft, non-tender. Renal:  No costovertebral tenderness  GU:  Not performed at this time. Endoc: No evident thyromegaly, no signs of acromegaly. Skin:   warm, no rash. Extremities: normal, no cyanosis, clubbing.   LABORATORY PANEL:    CBC Recent Labs  Lab 01/08/18 1251  WBC 6.0  HGB 16.9  HCT 50.9  PLT 322   ------------------------------------------------------------------------------------------------------------------  Chemistries  Recent Labs  Lab 01/06/18 1522  NA 140  K 4.5  CL 107  CO2 21*  GLUCOSE 109*  BUN 25*  CREATININE 1.25*  CALCIUM 8.8*  AST 90*  ALT 114*  ALKPHOS 78  BILITOT 0.9   ------------------------------------------------------------------------------------------------------------------  Cardiac Enzymes Recent Labs  Lab 01/06/18 1522  TROPONINI <0.03   ------------------------------------------------------------  RADIOLOGY:   No results found for this or any previous visit. No results found for this or any previous visit. ------------------------------------------------------------------------------------------------------------------  Thank  you for allowing [facility] to assist in the care of your patient. Our recommendations are noted above.  Please contact us if we can be of further service.   Marda Stalker, MD.  [facility] Office Number: [phone]  Patricia Pesa, M.D.  Merton Border, M.D  01/08/2018

## 2018-01-08 NOTE — Progress Notes (Signed)
[place] for Heparin Indication: pulmonary embolus  Allergies  Allergen Reactions  . Penicillins Itching    Has patient had a PCN reaction causing immediate rash, facial/tongue/throat swelling, SOB or lightheadedness with hypotension: Yes Has patient had a PCN reaction causing severe rash involving mucus membranes or skin necrosis: No Has patient had a PCN reaction that required hospitalization: No Has patient had a PCN reaction occurring within the last 10 years: No If all of the above answers are "NO", then may proceed with Cephalosporin use.  . Sulfa Antibiotics Itching    Patient Measurements: Height: 5\' 2"  (157.5 cm) Weight: 227 lb 1.2 oz (103 kg) IBW/kg (Calculated) : 54.6 Heparin Dosing Weight: 75 kg  Vital Signs: Temp: 97.6 F (36.4 C) (01/16 0449) Temp Source: Oral (01/16 0449) BP: 136/78 (01/16 0449) Pulse Rate: 72 (01/16 0449)  Labs: Recent Labs    01/06/18 1354 01/06/18 1522 01/06/18 1609 01/07/18 1413 01/07/18 2042 01/08/18 0514  HGB  --  17.1  --   --   --   --   HCT  --  50.6  --   --   --   --   PLT  --  333  --   --   --   --   APTT  --   --  25  --   --   --   LABPROT  --   --  13.6  --   --   --   INR  --   --  1.05  --   --   --   HEPARINUNFRC  --   --   --  0.55 0.48 0.80*  CREATININE 1.30* 1.25*  --   --   --   --   TROPONINI  --  <0.03  --   --   --   --     Estimated Creatinine Clearance: 52.6 mL/min (A) (by C-G formula based on SCr of 1.25 mg/dL (H)).   Medical History: Past Medical History:  Diagnosis Date  . Dyslipidemia   . Edema   . Gastric ulcer   . OSA on CPAP   . Prediabetes   . Seasonal allergies    Assessment: 77 y/o M admitted from pulmonology office with PE. Patient not on anticoagulation PTA.   Goal of Therapy:  Heparin level 0.3-0.7 units/ml Monitor platelets by anticoagulation protocol: Yes   Plan:  1/15 1413 HL therapeutic at 0.55. Continue current rate of heparin 1350  u/hr and recheck confirmatory HL in 6 hours.   1/15 20:42 HL = 0.48.   Will continue this pt on current rate and recheck HL on 1/16 with AM labs.   01/15 @ 0500 HL 0.80 supratherapeutic. Will decrease rate to 1150 units/hr and recheck @ 1300 and will check CBC w/ am labs.  Tobie Lords, PharmD Clinical Pharmacist 01/08/2018 6:31 AM

## 2018-01-08 NOTE — Consult Note (Signed)
[facility] Vascular Consult Note  MRN : [id number]  Adam Knight is a 77 y.o. (1941-05-06) male who presents with chief complaint of  Chief Complaint  Patient presents with  . Shortness of Breath  .  History of Present Illness: I am asked to see the patient by Dr. Ashby Dawes.  The patient is a 77 year old gentleman who presented to  [facility] secondary to increasing shortness of breath.  Although he describes this as a process that has been ongoing for the past several years he relates that over the past 4 or 5 days it has become acutely worse.  He follows with a pulmonologist who ordered a chest CT which demonstrated both acute as well as subacute pulmonary emboli.  Subsequently he was sent to [facility] for admission.  At the time of his initial admission he was dyspneic on exertion but had no complaints of chest pain no complaints of cough or productive sputum he denied wheezing.  Current Facility-Administered Medications  Medication Dose Route Frequency Provider Last Rate Last Dose  . 0.9 %  sodium chloride infusion  250 mL Intravenous PRN Demetrios Loll, MD      . acetaminophen (TYLENOL) tablet 650 mg  650 mg Oral Q6H PRN Demetrios Loll, MD   650 mg at 01/06/18 2148   Or  . acetaminophen (TYLENOL) suppository 650 mg  650 mg Rectal Q6H PRN Demetrios Loll, MD      . albuterol (PROVENTIL) (2.5 MG/3ML) 0.083% nebulizer solution 2.5 mg  2.5 mg Nebulization Q2H PRN Demetrios Loll, MD      . bisacodyl (DULCOLAX) EC tablet 5 mg  5 mg Oral Daily PRN Demetrios Loll, MD      . clindamycin (CLEOCIN) IVPB 300 mg  300 mg Intravenous On Call to [place], Dolores Lory, MD      . ethacrynic acid (EDECRIN) tablet 25 mg  25 mg Oral BID Demetrios Loll, MD      . fluticasone furoate-vilanterol (BREO ELLIPTA) 100-25 MCG/INH 1 puff  1 puff Inhalation Daily Demetrios Loll, MD      . heparin ADULT infusion 100 units/mL (25000 units/226mL sodium chloride 0.45%)  1,150 Units/hr  Intravenous Continuous Sudini, Srikar, MD 13.5 mL/hr at 01/08/18 0600 1,350 Units/hr at 01/08/18 0600  . HYDROcodone-acetaminophen (NORCO/VICODIN) 5-325 MG per tablet 1-2 tablet  1-2 tablet Oral Q4H PRN Demetrios Loll, MD      . ondansetron [facility]) tablet 4 mg  4 mg Oral Q6H PRN Demetrios Loll, MD       Or  . ondansetron [facility]) injection 4 mg  4 mg Intravenous Q6H PRN Demetrios Loll, MD      . pantoprazole (PROTONIX) EC tablet 40 mg  40 mg Oral Daily Hillary Bow, MD   40 mg at 01/07/18 1823  . senna-docusate (Senokot-S) tablet 1 tablet  1 tablet Oral QHS PRN Demetrios Loll, MD      . sodium chloride (OCEAN) 0.65 % nasal spray 1 spray  1 spray Each Nare PRN Demetrios Loll, MD      . sodium chloride flush (NS) 0.9 % injection 3 mL  3 mL Intravenous Q12H Demetrios Loll, MD   3 mL at 01/06/18 2149  . sodium chloride flush (NS) 0.9 % injection 3 mL  3 mL Intravenous PRN Demetrios Loll, MD      . spironolactone (ALDACTONE) tablet 50 mg  50 mg Oral Daily Hillary Bow, MD   50 mg at 01/07/18 1052    Past Medical History:  Diagnosis Date  . Dyslipidemia   . Edema   . Gastric ulcer   . OSA on CPAP   . Prediabetes   . Seasonal allergies     Past Surgical History:  Procedure Laterality Date  . TONSILLECTOMY      Social History Social History   Tobacco Use  . Smoking status: Former Smoker    Packs/day: 0.30    Years: 5.00    Pack years: 1.50    Types: Cigarettes  . Smokeless tobacco: Never Used  Substance Use Topics  . Alcohol use: No    Alcohol/week: 0.0 oz  . Drug use: No    Family History Family History  Problem Relation Age of Onset  . Stroke Mother   . Coronary artery disease Mother   . Osteoporosis Mother   . Rheum arthritis Mother   . Thyroid disease Mother   No family history of bleeding/clotting disorders, porphyria or autoimmune disease   Allergies  Allergen Reactions  . Penicillins Itching    Has patient had a PCN reaction causing immediate rash, facial/tongue/throat swelling, SOB  or lightheadedness with hypotension: Yes Has patient had a PCN reaction causing severe rash involving mucus membranes or skin necrosis: No Has patient had a PCN reaction that required hospitalization: No Has patient had a PCN reaction occurring within the last 10 years: No If all of the above answers are "NO", then may proceed with Cephalosporin use.  . Sulfa Antibiotics Itching     REVIEW OF SYSTEMS (Negative unless checked)  Constitutional: [] Weight loss  [] Fever  [] Chills Cardiac: [] Chest pain   [] Chest pressure   [] Palpitations   [x] Shortness of breath when laying flat   [] Shortness of breath at rest   [x] Shortness of breath with exertion. Vascular:  [] Pain in legs with walking   [] Pain in legs at rest   [] Pain in legs when laying flat   [] Claudication   [] Pain in feet when walking  [] Pain in feet at rest  [] Pain in feet when laying flat   [] History of DVT   [] Phlebitis   [x] Swelling in legs   [] Varicose veins   [] Non-healing ulcers Pulmonary:   [] Uses home oxygen   [] Productive cough   [] Hemoptysis   [] Wheeze  [] COPD   [] Asthma Neurologic:  [] Dizziness  [] Blackouts   [] Seizures   [] History of stroke   [] History of TIA  [] Aphasia   [] Temporary blindness   [] Dysphagia   [] Weakness or numbness in arms   [] Weakness or numbness in legs Musculoskeletal:  [x] Arthritis   [] Joint swelling   [x] Joint pain   [] Low back pain Hematologic:  [] Easy bruising  [] Easy bleeding   [] Hypercoagulable state   [] Anemic  [] Hepatitis Gastrointestinal:  [] Blood in stool   [] Vomiting blood  [] Gastroesophageal reflux/heartburn   [] Difficulty swallowing. Genitourinary:  [] Chronic kidney disease   [] Difficult urination  [] Frequent urination  [] Burning with urination   [] Blood in urine Skin:  [] Rashes   [] Ulcers   [] Wounds Psychological:  [] History of anxiety   []  History of major depression.  Physical Examination  Vitals:   01/07/18 0513 01/07/18 1222 01/07/18 2021 01/08/18 0449  BP: 96/68 121/88 129/80 136/78   Pulse: 75 84 88 72  Resp: 20  18   Temp: 97.7 F (36.5 C) (!) 97.3 F (36.3 C) 98.1 F (36.7 C) 97.6 F (36.4 C)  TempSrc: Oral  Oral Oral  SpO2: 90% 95% 92% 94%  Weight:      Height:       Body mass  index is 41.53 kg/m. Gen:  WD/WN, NAD sitting on the side of the bed talking in complete sentences no supplemental oxygen Head: [place]/AT, No temporalis wasting. Prominent temp pulse not noted. Ear/Nose/Throat: Hearing grossly intact, nares w/o erythema or drainage, oropharynx w/o Erythema/Exudate Eyes: Sclera non-icteric, conjunctiva clear Neck: Trachea midline.  No JVD.  Pulmonary:  Good air movement, respirations not labored, equal bilaterally.  Cardiac: RRR, normal S1, S2. Gastrointestinal: soft, non-tender/non-distended. No guarding/reflex.  Obese Musculoskeletal: M/S 5/5 throughout.  Extremities without ischemic changes.  No deformity or atrophy.  2+ hard edema bilaterally. Neurologic: Sensation grossly intact in extremities.  Symmetrical.  Speech is fluent. Motor exam as listed above. Psychiatric: Judgment intact, Mood & affect appropriate for pt's clinical situation. Dermatologic: Mild venous rashes no ulcers noted.  No cellulitis or open wounds. Lymph : No Cervical, Axillary, or Inguinal lymphadenopathy.      CBC Lab Results  Component Value Date   WBC 7.9 01/06/2018   HGB 17.1 01/06/2018   HCT 50.6 01/06/2018   MCV 90.5 01/06/2018   PLT 333 01/06/2018    BMET    Component Value Date/Time   NA 140 01/06/2018 1522   K 4.5 01/06/2018 1522   CL 107 01/06/2018 1522   CO2 21 (L) 01/06/2018 1522   GLUCOSE 109 (H) 01/06/2018 1522   BUN 25 (H) 01/06/2018 1522   CREATININE 1.25 (H) 01/06/2018 1522   CALCIUM 8.8 (L) 01/06/2018 1522   GFRNONAA 54 (L) 01/06/2018 1522   GFRAA >60 01/06/2018 1522   Estimated Creatinine Clearance: 52.6 mL/min (A) (by C-G formula based on SCr of 1.25 mg/dL (H)).  COAG Lab Results  Component Value Date   INR 1.05 01/06/2018     Radiology Ct Angio Chest Pe W Or Wo Contrast  Result Date: 01/06/2018 CLINICAL DATA:  Shortness of breath for 3 years which has worsened over the past week. EXAM: CT ANGIOGRAPHY CHEST WITH CONTRAST TECHNIQUE: Multidetector CT imaging of the chest was performed using the standard protocol during bolus administration of intravenous contrast. Multiplanar CT image reconstructions and MIPs were obtained to evaluate the vascular anatomy. CONTRAST:  75 ml ISOVUE-370 IOPAMIDOL (ISOVUE-370) INJECTION 76% COMPARISON:  None. FINDINGS: Cardiovascular: The examination is positive for bilateral pulmonary emboli. Several of the emboli are adherent to the vessel wall compatible with chronic change but others appear to occlude the vessel lumen suggesting acute PE. The RV/LV ratio is 1.6 consistent with right heart strain. Note is made that the main pulmonary artery measures slightly larger than the ascending aorta suggestive of pulmonary arterial hypertension. Heart size is normal. No pericardial effusion. No notable calcific atherosclerosis. Mediastinum/Nodes: No enlarged mediastinal, hilar, or axillary lymph nodes. Thyroid gland, trachea, and esophagus demonstrate no significant findings. Lungs/Pleura: No pleural effusion. A solitary right lower lobe nodule measures 0.5 cm on image 40 of series 6. No other nodules identified. Upper Abdomen: Parapelvic right renal cyst noted. Musculoskeletal: No acute or focal abnormality. Review of the MIP images confirms the above findings. IMPRESSION: The study is positive for pulmonary embolus. 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 Right heart strain may be chronic without evidence of pulmonary arterial hypertension present on the examination. 0.5 cm right lower lobe pulmonary nodule. No follow-up needed if patient is low-risk. Non-contrast chest CT can be considered in 12 months if patient is high-risk. This recommendation follows the consensus statement:  Guidelines for Management of Incidental Pulmonary Nodules Detected on CT Images: From the Fleischner Society 2017; Radiology 2017; 284:228-243.  Critical Value/emergent results were called by telephone at the time of interpretation on 01/06/2018 at 2:44 pm to Dr. Laverle Hobby , who verbally acknowledged these results. Electronically Signed   By: Inge Rise M.D.   On: 01/06/2018 14:51   US Venous Img Lower Bilateral  Result Date: 01/07/2018 CLINICAL DATA:  Pulmonary emboli, leg swelling, edema EXAM: BILATERAL LOWER EXTREMITY VENOUS DOPPLER ULTRASOUND TECHNIQUE: Gray-scale sonography with graded compression, as well as color Doppler and duplex ultrasound were performed to evaluate the lower extremity deep venous systems from the level of the common femoral vein and including the common femoral, femoral, profunda femoral, popliteal and calf veins including the posterior tibial, peroneal and gastrocnemius veins when visible. The superficial great saphenous vein was also interrogated. Spectral Doppler was utilized to evaluate flow at rest and with distal augmentation maneuvers in the common femoral, femoral and popliteal veins. COMPARISON:  None. FINDINGS: RIGHT LOWER EXTREMITY Common Femoral Vein: No evidence of thrombus. Normal compressibility, respiratory phasicity and response to augmentation. Saphenofemoral Junction: No evidence of thrombus. Normal compressibility and flow on color Doppler imaging. Profunda Femoral Vein: No evidence of thrombus. Normal compressibility and flow on color Doppler imaging. Femoral Vein: No evidence of thrombus. Normal compressibility, respiratory phasicity and response to augmentation. Popliteal Vein: No evidence of thrombus. Normal compressibility, respiratory phasicity and response to augmentation. Calf Veins: No evidence of thrombus. Normal compressibility and flow on color Doppler imaging. Superficial Great Saphenous Vein: No evidence of thrombus. Normal  compressibility. Venous Reflux:  None. Other Findings:  None. LEFT LOWER EXTREMITY Common Femoral Vein: No evidence of thrombus. Normal compressibility, respiratory phasicity and response to augmentation. Saphenofemoral Junction: No evidence of thrombus. Normal compressibility and flow on color Doppler imaging. Profunda Femoral Vein: No evidence of thrombus. Normal compressibility and flow on color Doppler imaging. Femoral Vein: No evidence of thrombus. Normal compressibility, respiratory phasicity and response to augmentation. Popliteal Vein: No evidence of thrombus. Normal compressibility, respiratory phasicity and response to augmentation. Calf Veins: No evidence of thrombus. Normal compressibility and flow on color Doppler imaging. Superficial Great Saphenous Vein: No evidence of thrombus. Normal compressibility. Venous Reflux:  None. Other Findings:  None. IMPRESSION: No evidence of deep venous thrombosis. Electronically Signed   By: Jerilynn Mages.  Shick M.D.   On: 01/07/2018 10:07      Assessment/Plan 1.  Acute with subacute pulmonary embolism: Currently the patient is on a heparin drip.  Although he is not requiring supplemental oxygen at this time he does note that his exercise capacity has diminished so much that he can barely walk around his house.  He is very interested in thrombolysis if this would lessen his shortness of breath.  The risks and benefits as well as alternative therapies were reviewed all questions have been answered patient agrees to proceed with pulmonary angiography and thrombolysis. 2.  COPD: Given the chronicity of his underlying pulmonary disease I have discussed very specifically that attempts at thrombolysis will not eliminate his pulmonary symptoms at the present time he has a combination of baseline pulmonary disease as well as thrombotic disease.  He will continue to follow with Dr. Raul Del. 3.  Hypercholesterolemia: Patient will continue his statin. 4.  Peptic ulcer disease:  Patient will continue PPI   Hortencia Pilar, MD  01/08/2018 8:06 AM    This note was created with Dragon medical transcription system.  Any error is purely unintentional

## 2018-01-08 NOTE — Progress Notes (Signed)
[place] for Heparin Indication: pulmonary embolus  Allergies  Allergen Reactions  . Penicillins Itching    Has patient had a PCN reaction causing immediate rash, facial/tongue/throat swelling, SOB or lightheadedness with hypotension: Yes Has patient had a PCN reaction causing severe rash involving mucus membranes or skin necrosis: No Has patient had a PCN reaction that required hospitalization: No Has patient had a PCN reaction occurring within the last 10 years: No If all of the above answers are "NO", then may proceed with Cephalosporin use.  . Sulfa Antibiotics Itching    Patient Measurements: Height: 5\' 2"  (157.5 cm) Weight: 227 lb 1.2 oz (103 kg) IBW/kg (Calculated) : 54.6 Heparin Dosing Weight: 75 kg  Vital Signs: Temp: 97.6 F (36.4 C) (01/16 0449) Temp Source: Oral (01/16 0449) BP: 136/78 (01/16 0449) Pulse Rate: 72 (01/16 0449)  Labs: Recent Labs    01/06/18 1354 01/06/18 1522 01/06/18 1609  01/07/18 2042 01/08/18 0514 01/08/18 1251  HGB  --  17.1  --   --   --   --  16.9  HCT  --  50.6  --   --   --   --  50.9  PLT  --  333  --   --   --   --  322  APTT  --   --  25  --   --   --   --   LABPROT  --   --  13.6  --   --   --   --   INR  --   --  1.05  --   --   --   --   HEPARINUNFRC  --   --   --    < > 0.48 0.80* 0.54  CREATININE 1.30* 1.25*  --   --   --   --   --   TROPONINI  --  <0.03  --   --   --   --   --    < > = values in this interval not displayed.    Estimated Creatinine Clearance: 52.6 mL/min (A) (by C-G formula based on SCr of 1.25 mg/dL (H)).   Medical History: Past Medical History:  Diagnosis Date  . Dyslipidemia   . Edema   . Gastric ulcer   . OSA on CPAP   . Prediabetes   . Seasonal allergies    Assessment: 77 y/o M admitted from pulmonology office with PE. Patient not on anticoagulation PTA.   Goal of Therapy:  Heparin level 0.3-0.7 units/ml Monitor platelets by anticoagulation  protocol: Yes   Plan:  1/15 1413 HL therapeutic at 0.55. Continue current rate of heparin 1350 u/hr and recheck confirmatory HL in 6 hours.   1/15 20:42 HL = 0.48.   Will continue this pt on current rate and recheck HL on 1/16 with AM labs.   01/15 @ 0500 HL 0.80 supratherapeutic. Will decrease rate to 1150 units/hr and recheck @ 1300 and will check CBC w/ am labs.  01/15 @1251  HL 0.54- Therapeutic. Will continue current rate of 1150 units/hr and recheck HL in 8 hours.   Pernell Dupre, PharmD, BCPS Clinical Pharmacist 01/08/2018 2:02 PM

## 2018-01-08 NOTE — Op Note (Signed)
[facility]  Percutaneous Study/Intervention Procedural Note   Date of Surgery: 01/08/2018,4:15 PM  Surgeon:Derrek Puff, Dolores Lory   Pre-operative Diagnosis: Pulmonary emboli with right heart strain and hypoxia  Post-operative diagnosis:  Same  Procedure(s) Performed:  1.  Selective injection right subsegmental pulmonary arteries apical and posterior basilar  2.  Infusion of TPA for thrombolysis  3.  Mechanical thrombectomy pulmonary emboli using the penumbra cat 8 device both apical and posterior basilar    Anesthesia: Conscious sedation was administered under my direct supervision by the interventional radiology RN. IV Versed plus fentanyl were utilized. Continuous ECG, pulse oximetry and blood pressure was monitored throughout the entire procedure.  Conscious sedation was administered for a total of 47 minutes.  Sheath: 8 French Pinnacle sheath right common femoral vein  Contrast: 60 cc   Fluoroscopy Time: 1645 minutes  Indications:  Patient presented to the hospital with hypoxia and shortness of breath. The patient is unable to get out of bed and transfer to a chair without increased dyspnea.  CT angiogram demonstrated bilateral pulmonary emboli associated with significant right heart strain. Given the long-term sequela and the patient's symptomatic condition the risks and benefits for angiography with thrombectomy are reviewed all questions are answered, the patient agrees to proceed.  Procedure:  Adam Knight a 77 y.o. male who was identified and appropriate procedural time out was performed.  The patient was then placed supine on the table and prepped and draped in the usual sterile fashion.  Ultrasound was used to evaluate the right common femoral vein.  It was compressible indicating it is patent .  A digital ultrasound image was acquired for the permanent record.  A micropuncture needle was used to access the right common femoral vein under direct  ultrasound guidance.  A microwire was then advanced under fluoroscopic guidance followed by micro-sheath.  A 0.035 J wire was advanced without resistance and a 5Fr sheath was placed.    The J-wire pigtail catheter was advanced up to the right atrium where a bolus injection contrast was used to demonstrate the pulmonary artery outflow. Floppy Glidewire was then exchanged for the J-wire and the pigtail catheter was exchanged for an JR4. Using the combination of the floppy Glidewire and the JR4 catheter the pulmonary outflow track was negotiated.  With the catheter in the right side bolus injection contrast was utilized to demonstrate the right pulmonary artery vasculature. This demonstrated thrombus in the apical branch as well as the posterior basilar branch as noted on the CT scan.  The JR4 catheter was then advanced out so that it was positioned within the thrombus and 12 milligrams of TPA were infused directly into the clot.  While the TPA was dwelling for approximately 15 minutes.  After an appropriate dwell time the Magic torque wires reintroduced through the JR4 catheter and the pigtail catheter.  The Penumbra Cat 8 extra torque catheter was then advanced into the thrombus in the posterior basilar branches of the right side. After multiple passes the catheter was then repositioned into the apical branch of the right side.  Again, after multiple passes there was free flow of blood and therefore the catheter was removed the pigtail reintroduced and pulmonary angiography was performed.   After review these images the catheter and sheath were removed and pressure held. There were no immediate complications.  Findings:   Right pulmonary artery: Injection via the pigtail catheter at the atrial caval junction was notable for very slow flow suggesting poor right  heart function and/or significant pulmonary hypertension.  The main left and right pulmonary arteries are widely patent.  On the right side the  posterior basilar artery appears to have a filling defect and the apical branch appears to have a filling defect these are consistent with the findings on CT.  Following TPA administration and then selective thrombectomy using the penumbra cat 8 device there is near total resolution by angiography of the thrombus.     Summary: Successful right-sided pulmonary thrombectomy.   Disposition: Patient was taken to the recovery room in stable condition having tolerated the procedure well.  Adam Knight Adam Knight 01/08/2018,4:15 PM

## 2018-01-08 NOTE — Progress Notes (Signed)
[place] for Heparin Indication: pulmonary embolus  Allergies  Allergen Reactions  . Penicillins Itching    Has patient had a PCN reaction causing immediate rash, facial/tongue/throat swelling, SOB or lightheadedness with hypotension: Yes Has patient had a PCN reaction causing severe rash involving mucus membranes or skin necrosis: No Has patient had a PCN reaction that required hospitalization: No Has patient had a PCN reaction occurring within the last 10 years: No If all of the above answers are "NO", then may proceed with Cephalosporin use.  . Sulfa Antibiotics Itching    Patient Measurements: Height: 5\' 2"  (157.5 cm) Weight: 227 lb 1.2 oz (103 kg) IBW/kg (Calculated) : 54.6 Heparin Dosing Weight: 78 kg  Vital Signs: Temp: 97.9 F (36.6 C) (01/16 1425) Temp Source: Oral (01/16 1425) BP: 108/79 (01/16 1645) Pulse Rate: 79 (01/16 1645)  Labs: Recent Labs    01/06/18 1354 01/06/18 1522 01/06/18 1609  01/07/18 2042 01/08/18 0514 01/08/18 1251  HGB  --  17.1  --   --   --   --  16.9  HCT  --  50.6  --   --   --   --  50.9  PLT  --  333  --   --   --   --  322  APTT  --   --  25  --   --   --   --   LABPROT  --   --  13.6  --   --   --   --   INR  --   --  1.05  --   --   --   --   HEPARINUNFRC  --   --   --    < > 0.48 0.80* 0.54  CREATININE 1.30* 1.25*  --   --   --   --   --   TROPONINI  --  <0.03  --   --   --   --   --    < > = values in this interval not displayed.    Estimated Creatinine Clearance: 52.6 mL/min (A) (by C-G formula based on SCr of 1.25 mg/dL (H)).   Medical History: Past Medical History:  Diagnosis Date  . Dyslipidemia   . Edema   . Gastric ulcer   . OSA on CPAP   . Prediabetes   . Seasonal allergies    Assessment: 77 y/o M admitted from pulmonology office with PE. Patient not on anticoagulation PTA.   Patient has been anticoagulated on a heparin drip and underwent mechanical thrombectomy today  by vascular surgery. Per consult entered by surgeon, will resume heparin drip with no initial bolus at 1800 this evening at previously therapeutic rate.  Goal of Therapy:  Heparin level 0.3-0.7 units/ml Monitor platelets by anticoagulation protocol: Yes   Plan:  Confirmed with RN that patient was on heparin infusion at rate of 1150 units/hr prior to procedure today.  Will resume heparin infusion at 1150 units/hr starting at 1800 this evening (discussed with RN). Will order 8 hour heparin level and CBC with AM labs tomorrow. Pharmacy to monitor.  Lenis Noon, PharmD, BCPS Clinical Pharmacist 01/08/2018 5:35 PM

## 2018-01-08 NOTE — Care Management Important Message (Signed)
Important Message  Patient Details  Name: Adam Knight MRN: [id number] Date of Birth: [birth year]   Medicare Important Message Given:  Yes    Beverly Sessions, RN 01/08/2018, 2:18 PM

## 2018-01-08 NOTE — Progress Notes (Signed)
[place] at [place] NAME: Ty Buntrock    MR#:  [id number]  DATE OF BIRTH:  Mar 12, 1941  SUBJECTIVE:  CHIEF COMPLAINT:   Chief Complaint  Patient presents with  . Shortness of Breath   - feels about the same, some dyspnea on exertion - for pulmonary angiography with the mechanical and chemical thrombolysis today  REVIEW OF SYSTEMS:  Review of Systems  Constitutional: Negative for chills, fever and malaise/fatigue.  HENT: Negative for congestion, ear discharge, hearing loss and nosebleeds.   Eyes: Negative for blurred vision and double vision.  Respiratory: Positive for shortness of breath. Negative for cough and wheezing.   Cardiovascular: Negative for chest pain and palpitations.  Gastrointestinal: Negative for abdominal pain, constipation, diarrhea, nausea and vomiting.  Genitourinary: Negative for dysuria.  Musculoskeletal: Negative for myalgias.  Neurological: Negative for dizziness, speech change, focal weakness, seizures and headaches.  Psychiatric/Behavioral: Negative for depression.    DRUG ALLERGIES:   Allergies  Allergen Reactions  . Penicillins Itching    Has patient had a PCN reaction causing immediate rash, facial/tongue/throat swelling, SOB or lightheadedness with hypotension: Yes Has patient had a PCN reaction causing severe rash involving mucus membranes or skin necrosis: No Has patient had a PCN reaction that required hospitalization: No Has patient had a PCN reaction occurring within the last 10 years: No If all of the above answers are "NO", then may proceed with Cephalosporin use.  . Sulfa Antibiotics Itching    VITALS:  Blood pressure 136/78, pulse 72, temperature 97.6 F (36.4 C), temperature source Oral, resp. rate 18, height 5\' 2"  (1.575 m), weight 103 kg (227 lb 1.2 oz), SpO2 94 %.  PHYSICAL EXAMINATION:  Physical Exam  GENERAL:  77 y.o.-year-old obese patient lying in the bed with no acute  distress.  EYES: Pupils equal, round, reactive to light and accommodation. No scleral icterus. Extraocular muscles intact.  HEENT: Head atraumatic, normocephalic. Oropharynx and nasopharynx clear.  NECK:  Supple, no jugular venous distention. No thyroid enlargement, no tenderness.  LUNGS: Normal breath sounds bilaterally, no wheezing, rales,rhonchi or crepitation. No use of accessory muscles of respiration. Decreased bibasilar breath sounds CARDIOVASCULAR: S1, S2 normal. No murmurs, rubs, or gallops.  ABDOMEN: Soft, nontender, nondistended. Bowel sounds present. No organomegaly or mass.  EXTREMITIES: No pedal edema, cyanosis, or clubbing.  NEUROLOGIC: Cranial nerves II through XII are intact. Muscle strength 5/5 in all extremities. Sensation intact. Gait not checked.  PSYCHIATRIC: The patient is alert and oriented x 3.  SKIN: No obvious rash, lesion, or ulcer.    LABORATORY PANEL:   CBC Recent Labs  Lab 01/08/18 1251  WBC 6.0  HGB 16.9  HCT 50.9  PLT 322   ------------------------------------------------------------------------------------------------------------------  Chemistries  Recent Labs  Lab 01/06/18 1522  NA 140  K 4.5  CL 107  CO2 21*  GLUCOSE 109*  BUN 25*  CREATININE 1.25*  CALCIUM 8.8*  AST 90*  ALT 114*  ALKPHOS 78  BILITOT 0.9   ------------------------------------------------------------------------------------------------------------------  Cardiac Enzymes Recent Labs  Lab 01/06/18 1522  TROPONINI <0.03   ------------------------------------------------------------------------------------------------------------------  RADIOLOGY:  Ct Angio Chest Pe W Or Wo Contrast  Result Date: 01/06/2018 CLINICAL DATA:  Shortness of breath for 3 years which has worsened over the past week. EXAM: CT ANGIOGRAPHY CHEST WITH CONTRAST TECHNIQUE: Multidetector CT imaging of the chest was performed using the standard protocol during bolus administration of  intravenous contrast. Multiplanar CT image reconstructions and MIPs were obtained to evaluate  the vascular anatomy. CONTRAST:  75 ml ISOVUE-370 IOPAMIDOL (ISOVUE-370) INJECTION 76% COMPARISON:  None. FINDINGS: Cardiovascular: The examination is positive for bilateral pulmonary emboli. Several of the emboli are adherent to the vessel wall compatible with chronic change but others appear to occlude the vessel lumen suggesting acute PE. The RV/LV ratio is 1.6 consistent with right heart strain. Note is made that the main pulmonary artery measures slightly larger than the ascending aorta suggestive of pulmonary arterial hypertension. Heart size is normal. No pericardial effusion. No notable calcific atherosclerosis. Mediastinum/Nodes: No enlarged mediastinal, hilar, or axillary lymph nodes. Thyroid gland, trachea, and esophagus demonstrate no significant findings. Lungs/Pleura: No pleural effusion. A solitary right lower lobe nodule measures 0.5 cm on image 40 of series 6. No other nodules identified. Upper Abdomen: Parapelvic right renal cyst noted. Musculoskeletal: No acute or focal abnormality. Review of the MIP images confirms the above findings. IMPRESSION: The study is positive for pulmonary embolus. 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 Right heart strain may be chronic without evidence of pulmonary arterial hypertension present on the examination. 0.5 cm right lower lobe pulmonary nodule. No follow-up needed if patient is low-risk. Non-contrast chest CT can be considered in 12 months if patient is high-risk. This recommendation follows the consensus statement: Guidelines for Management of Incidental Pulmonary Nodules Detected on CT Images: From the Fleischner Society 2017; Radiology 2017; 284:228-243. Critical Value/emergent results were called by telephone at the time of interpretation on 01/06/2018 at 2:44 pm to Dr. Laverle Hobby , who verbally acknowledged these results.  Electronically Signed   By: Inge Rise M.D.   On: 01/06/2018 14:51   US Venous Img Lower Bilateral  Result Date: 01/07/2018 CLINICAL DATA:  Pulmonary emboli, leg swelling, edema EXAM: BILATERAL LOWER EXTREMITY VENOUS DOPPLER ULTRASOUND TECHNIQUE: Gray-scale sonography with graded compression, as well as color Doppler and duplex ultrasound were performed to evaluate the lower extremity deep venous systems from the level of the common femoral vein and including the common femoral, femoral, profunda femoral, popliteal and calf veins including the posterior tibial, peroneal and gastrocnemius veins when visible. The superficial great saphenous vein was also interrogated. Spectral Doppler was utilized to evaluate flow at rest and with distal augmentation maneuvers in the common femoral, femoral and popliteal veins. COMPARISON:  None. FINDINGS: RIGHT LOWER EXTREMITY Common Femoral Vein: No evidence of thrombus. Normal compressibility, respiratory phasicity and response to augmentation. Saphenofemoral Junction: No evidence of thrombus. Normal compressibility and flow on color Doppler imaging. Profunda Femoral Vein: No evidence of thrombus. Normal compressibility and flow on color Doppler imaging. Femoral Vein: No evidence of thrombus. Normal compressibility, respiratory phasicity and response to augmentation. Popliteal Vein: No evidence of thrombus. Normal compressibility, respiratory phasicity and response to augmentation. Calf Veins: No evidence of thrombus. Normal compressibility and flow on color Doppler imaging. Superficial Great Saphenous Vein: No evidence of thrombus. Normal compressibility. Venous Reflux:  None. Other Findings:  None. LEFT LOWER EXTREMITY Common Femoral Vein: No evidence of thrombus. Normal compressibility, respiratory phasicity and response to augmentation. Saphenofemoral Junction: No evidence of thrombus. Normal compressibility and flow on color Doppler imaging. Profunda Femoral Vein: No  evidence of thrombus. Normal compressibility and flow on color Doppler imaging. Femoral Vein: No evidence of thrombus. Normal compressibility, respiratory phasicity and response to augmentation. Popliteal Vein: No evidence of thrombus. Normal compressibility, respiratory phasicity and response to augmentation. Calf Veins: No evidence of thrombus. Normal compressibility and flow on color Doppler imaging. Superficial Great Saphenous Vein: No evidence of  thrombus. Normal compressibility. Venous Reflux:  None. Other Findings:  None. IMPRESSION: No evidence of deep venous thrombosis. Electronically Signed   By: Jerilynn Mages.  Shick M.D.   On: 01/07/2018 10:07    EKG:   Orders placed or performed during the hospital encounter of 01/06/18  . ED EKG  . ED EKG  . EKG    ASSESSMENT AND PLAN:   77 year old male with past medical history significant for bilateral lower extremity edema, hyperlipidemia, sleep apnea, prediabetes presents to hospital secondary to worsening shortness of breath  1. Acute bilateral pulmonary emboli-also has chronic clot burden and right heart strain -Lower extremity Dopplers negative for DVT -On heparin drip. -Appreciate pulmonary consult. Appreciate vascular consult. Patient scheduled for pulmonary angiography with mechanical/chemical thrombolysis today -Transition to oral anticoagulation tomorrow.  2. Sleep apnea-obesity -And tinea CPAP at bedtime. On room air at this time  3. Chronic bilateral lower extremity edema-continue Aldactone and ethacrynic acid  4. GERD-on Protonix  5. DVT prophylaxis - already on  heparin drip     All the records are reviewed and case discussed with Care Management/Social Workerr. Management plans discussed with the patient, family and they are in agreement.  CODE STATUS: Full code  TOTAL TIME TAKING CARE OF THIS PATIENT: 39 minutes.   POSSIBLE D/C IN 1-2 DAYS, DEPENDING ON CLINICAL CONDITION.   Gladstone Lighter M.D on 01/08/2018 at 1:49  PM  Between 7am to 6pm - Pager - [phone]  After 6pm go to [email or web address] - Proofreader  [facility]  Office  [phone]  CC: Primary care physician; Gearldine Shown, DO

## 2018-01-09 DIAGNOSIS — I2699 Other pulmonary embolism without acute cor pulmonale: Secondary | ICD-10-CM

## 2018-01-09 LAB — BASIC METABOLIC PANEL
ANION GAP: 9 (ref 5–15)
BUN: 21 mg/dL — ABNORMAL HIGH (ref 6–20)
CALCIUM: 8.3 mg/dL — AB (ref 8.9–10.3)
CO2: 21 mmol/L — ABNORMAL LOW (ref 22–32)
Chloride: 104 mmol/L (ref 101–111)
Creatinine, Ser: 1.05 mg/dL (ref 0.61–1.24)
Glucose, Bld: 147 mg/dL — ABNORMAL HIGH (ref 65–99)
Potassium: 4.4 mmol/L (ref 3.5–5.1)
SODIUM: 134 mmol/L — AB (ref 135–145)

## 2018-01-09 LAB — CBC
HCT: 44.9 % (ref 40.0–52.0)
Hemoglobin: 15.1 g/dL (ref 13.0–18.0)
MCH: 30.3 pg (ref 26.0–34.0)
MCHC: 33.5 g/dL (ref 32.0–36.0)
MCV: 90.5 fL (ref 80.0–100.0)
PLATELETS: 317 10*3/uL (ref 150–440)
RBC: 4.96 MIL/uL (ref 4.40–5.90)
RDW: 15.4 % — AB (ref 11.5–14.5)
WBC: 5.5 10*3/uL (ref 3.8–10.6)

## 2018-01-09 LAB — HEPARIN LEVEL (UNFRACTIONATED): Heparin Unfractionated: 0.29 IU/mL — ABNORMAL LOW (ref 0.30–0.70)

## 2018-01-09 MED ORDER — RIVAROXABAN 20 MG PO TABS: 20.0000 mg | ORAL_TABLET | Freq: Every day | ORAL | 2 refills | Status: DC

## 2018-01-09 MED ORDER — RIVAROXABAN 15 MG PO TABS
15.0000 mg | ORAL_TABLET | Freq: Two times a day (BID) | ORAL | Status: DC
  Administered 2018-01-09: 15 mg via ORAL
  Filled 2018-01-09: qty 1

## 2018-01-09 MED ORDER — HEPARIN BOLUS VIA INFUSION
1500.0000 [IU] | Freq: Once | INTRAVENOUS | Status: AC
  Administered 2018-01-09: 1500 [IU] via INTRAVENOUS
  Filled 2018-01-09: qty 1500

## 2018-01-09 MED ORDER — RIVAROXABAN 20 MG PO TABS: 20.0000 mg | ORAL_TABLET | Freq: Every day | ORAL | Status: DC

## 2018-01-09 MED ORDER — RIVAROXABAN 15 MG PO TABS: 15.0000 mg | ORAL_TABLET | Freq: Two times a day (BID) | ORAL | 0 refills | Status: DC

## 2018-01-09 NOTE — Progress Notes (Signed)
Patient discharge teaching given, including activity, diet, follow-up appoints, and medications. Patient verbalized understanding of all discharge instructions. IV access was d/c'd. Vitals are stable. Skin is intact except as charted in most recent assessments. Pt refused to be escorted out, to be driven home by family.  Adam Knight  

## 2018-01-09 NOTE — Discharge Summary (Signed)
[place] at [place] NAME: Adam Knight    MR#:  [id number]  DATE OF BIRTH:  [birth year]  DATE OF ADMISSION:  01/06/2018   ADMITTING PHYSICIAN: Demetrios Loll, MD  DATE OF DISCHARGE: 01/09/18  PRIMARY CARE PHYSICIAN: Gearldine Shown, DO   ADMISSION DIAGNOSIS:   Other acute pulmonary embolism with acute cor pulmonale ([place]) [I26.09]  DISCHARGE DIAGNOSIS:   Active Problems:   Pulmonary emboli (HCC)   SECONDARY DIAGNOSIS:   Past Medical History:  Diagnosis Date  . Dyslipidemia   . Edema   . Gastric ulcer   . OSA on CPAP   . Prediabetes   . Seasonal allergies     HOSPITAL COURSE:   77 year old male with past medical history significant for bilateral lower extremity edema, hyperlipidemia, sleep apnea, prediabetes presents to hospital secondary to worsening shortness of breath  1. Acute bilateral pulmonary emboli-also has chronic clot burden and right heart strain -Lower extremity Dopplers negative for DVT -was on  heparin drip. -Appreciate pulmonary consult. Appreciate vascular consult. Patient is s/p pulmonary angiography with mechanical/chemical thrombolysis - Infusion of TPA for thrombolysis and Mechanical thrombectomy pulmonary emboli both apical and posterior basilar - Changed IV heparin to oral Xarelto today. Patient will follow up with vascular as outpatient  2. Sleep apnea-obesity -continue CPAP at bedtime. On room air at this time  3. Chronic bilateral lower extremity edema-continue Aldactone   4. GERD-OTC meds prn    DISCHARGE CONDITIONS:   Guarded  CONSULTS OBTAINED:   Treatment Team:  Laverle Hobby, MD Allyne Gee, MD Algernon Huxley, MD  DRUG ALLERGIES:   Allergies  Allergen Reactions  . Penicillins Itching    Has patient had a PCN reaction causing immediate rash, facial/tongue/throat swelling, SOB or lightheadedness with hypotension: Yes Has patient had a PCN reaction  causing severe rash involving mucus membranes or skin necrosis: No Has patient had a PCN reaction that required hospitalization: No Has patient had a PCN reaction occurring within the last 10 years: No If all of the above answers are "NO", then may proceed with Cephalosporin use.  . Sulfa Antibiotics Itching   DISCHARGE MEDICATIONS:   Allergies as of 01/09/2018      Reactions   Penicillins Itching   Has patient had a PCN reaction causing immediate rash, facial/tongue/throat swelling, SOB or lightheadedness with hypotension: Yes Has patient had a PCN reaction causing severe rash involving mucus membranes or skin necrosis: No Has patient had a PCN reaction that required hospitalization: No Has patient had a PCN reaction occurring within the last 10 years: No If all of the above answers are "NO", then may proceed with Cephalosporin use.   Sulfa Antibiotics Itching      Medication List    STOP taking these medications   ethacrynic acid 25 MG tablet Commonly known as:  EDECRIN   fluticasone furoate-vilanterol 100-25 MCG/INH Aepb Commonly known as:  BREO ELLIPTA     TAKE these medications   calcium carbonate (dosed in mg elemental calcium) 1250 MG/5ML Take 500 mg of elemental calcium by mouth daily with breakfast.   Rivaroxaban 15 MG Tabs tablet Commonly known as:  XARELTO Take 1 tablet (15 mg total) by mouth 2 (two) times daily with a meal for 21 days.   rivaroxaban 20 MG Tabs tablet Commonly known as:  XARELTO Take 1 tablet (20 mg total) by mouth daily. Start taking on:  01/30/2018   spironolactone 25 MG tablet Commonly known  as:  ALDACTONE Take 25 mg by mouth 2 (two) times daily.   Vitamin D 2000 units tablet Take 2,000 Units by mouth daily.        DISCHARGE INSTRUCTIONS:   1. PCP f/u in 1-2 weeks 2. Vascular f/u in 2 weeks  DIET:   Cardiac diet  ACTIVITY:   Activity as tolerated  OXYGEN:   Home Oxygen: No.  Oxygen Delivery: room air  DISCHARGE  LOCATION:   home   If you experience worsening of your admission symptoms, develop shortness of breath, life threatening emergency, suicidal or homicidal thoughts you must seek medical attention immediately by calling 911 or calling your MD immediately  if symptoms less severe.  You Must read complete instructions/literature along with all the possible adverse reactions/side effects for all the Medicines you take and that have been prescribed to you. Take any new Medicines after you have completely understood and accpet all the possible adverse reactions/side effects.   Please note  You were cared for by a hospitalist during your hospital stay. If you have any questions about your discharge medications or the care you received while you were in the hospital after you are discharged, you can call the unit and asked to speak with the hospitalist on call if the hospitalist that took care of you is not available. Once you are discharged, your primary care physician will handle any further medical issues. Please note that NO REFILLS for any discharge medications will be authorized once you are discharged, as it is imperative that you return to your primary care physician (or establish a relationship with a primary care physician if you do not have one) for your aftercare needs so that they can reassess your need for medications and monitor your lab values.    On the day of Discharge:  VITAL SIGNS:   Blood pressure 109/79, pulse 73, temperature (!) 97.4 F (36.3 C), temperature source Oral, resp. rate 19, height 5\' 2"  (1.575 m), weight 103 kg (227 lb 1.2 oz), SpO2 94 %.  PHYSICAL EXAMINATION:     GENERAL:  77 y.o.-year-old obese patient lying in the bed with no acute distress.  EYES: Pupils equal, round, reactive to light and accommodation. No scleral icterus. Extraocular muscles intact.  HEENT: Head atraumatic, normocephalic. Oropharynx and nasopharynx clear.  NECK:  Supple, no jugular venous  distention. No thyroid enlargement, no tenderness.  LUNGS: Normal breath sounds bilaterally, no wheezing, rales,rhonchi or crepitation. No use of accessory muscles of respiration. Decreased bibasilar breath sounds CARDIOVASCULAR: S1, S2 normal. No murmurs, rubs, or gallops.  ABDOMEN: Soft, nontender, nondistended. Bowel sounds present. No organomegaly or mass.  EXTREMITIES: No pedal edema, cyanosis, or clubbing.  NEUROLOGIC: Cranial nerves II through XII are intact. Muscle strength 5/5 in all extremities. Sensation intact. Gait not checked.  PSYCHIATRIC: The patient is alert and oriented x 3.  SKIN: No obvious rash, lesion, or ulcer.     DATA REVIEW:   CBC Recent Labs  Lab 01/09/18 0209  WBC 5.5  HGB 15.1  HCT 44.9  PLT 317    Chemistries  Recent Labs  Lab 01/06/18 1522 01/09/18 0209  NA 140 134*  K 4.5 4.4  CL 107 104  CO2 21* 21*  GLUCOSE 109* 147*  BUN 25* 21*  CREATININE 1.25* 1.05  CALCIUM 8.8* 8.3*  AST 90*  --   ALT 114*  --   ALKPHOS 78  --   BILITOT 0.9  --      Microbiology Results  No results found for this or any previous visit.  RADIOLOGY:  No results found.   Management plans discussed with the patient, family and they are in agreement.  CODE STATUS:     Code Status Orders  (From admission, onward)        Start     Ordered   01/06/18 1656  Full code  Continuous     01/06/18 1655    Code Status History    Date Active Date Inactive Code Status Order ID Comments User Context   This patient has a current code status but no historical code status.      TOTAL TIME TAKING CARE OF THIS PATIENT: 38 minutes.    Gladstone Lighter M.D on 01/09/2018 at 12:09 PM  Between 7am to 6pm - Pager - [phone]  After 6pm go to [email or web address] - password [email or web address] [facility]  [facility]  Office  [phone]  CC: Primary care physician; Gearldine Shown, DO   Note: This dictation was prepared with Dragon dictation  along with smaller phrase technology. Any transcriptional errors that result from this process are unintentional.

## 2018-01-09 NOTE — Progress Notes (Signed)
[place] for Heparin Indication: pulmonary embolus  Allergies  Allergen Reactions  . Penicillins Itching    Has patient had a PCN reaction causing immediate rash, facial/tongue/throat swelling, SOB or lightheadedness with hypotension: Yes Has patient had a PCN reaction causing severe rash involving mucus membranes or skin necrosis: No Has patient had a PCN reaction that required hospitalization: No Has patient had a PCN reaction occurring within the last 10 years: No If all of the above answers are "NO", then may proceed with Cephalosporin use.  . Sulfa Antibiotics Itching    Patient Measurements: Height: 5\' 2"  (157.5 cm) Weight: 227 lb 1.2 oz (103 kg) IBW/kg (Calculated) : 54.6 Heparin Dosing Weight: 78 kg  Vital Signs: BP: 116/75 (01/16 2040) Pulse Rate: 78 (01/16 2040)  Labs: Recent Labs    01/06/18 1354  01/06/18 1522 01/06/18 1609  01/08/18 0514 01/08/18 1251 01/09/18 0209  HGB  --    < > 17.1  --   --   --  16.9 15.1  HCT  --   --  50.6  --   --   --  50.9 44.9  PLT  --   --  333  --   --   --  322 317  APTT  --   --   --  25  --   --   --   --   LABPROT  --   --   --  13.6  --   --   --   --   INR  --   --   --  1.05  --   --   --   --   HEPARINUNFRC  --   --   --   --    < > 0.80* 0.54 0.29*  CREATININE 1.30*  --  1.25*  --   --   --   --  1.05  TROPONINI  --   --  <0.03  --   --   --   --   --    < > = values in this interval not displayed.    Estimated Creatinine Clearance: 62.6 mL/min (by C-G formula based on SCr of 1.05 mg/dL).   Medical History: Past Medical History:  Diagnosis Date  . Dyslipidemia   . Edema   . Gastric ulcer   . OSA on CPAP   . Prediabetes   . Seasonal allergies    Assessment: 77 y/o M admitted from pulmonology office with PE. Patient not on anticoagulation PTA.   Patient has been anticoagulated on a heparin drip and underwent mechanical thrombectomy today by vascular surgery. Per  consult entered by surgeon, will resume heparin drip with no initial bolus at 1800 this evening at previously therapeutic rate.  Goal of Therapy:  Heparin level 0.3-0.7 units/ml Monitor platelets by anticoagulation protocol: Yes   Plan:  Confirmed with RN that patient was on heparin infusion at rate of 1150 units/hr prior to procedure today.  Will resume heparin infusion at 1150 units/hr starting at 1800 this evening (discussed with RN). Will order 8 hour heparin level and CBC with AM labs tomorrow. Pharmacy to monitor.  01/17 @ 0200 HL 0.29 subtherapeutic. Will rebolus w/ heparin 1500 units IV x 1 and increase rate to 1300 units/hr and will recheck HL @ 1000. CBC trending down will continue to monitor.  Tobie Lords, PharmD, BCPS Clinical Pharmacist 01/09/2018 3:15 AM

## 2018-01-09 NOTE — Discharge Instructions (Signed)
Information on my medicine - XARELTO (rivaroxaban)  This medication education was reviewed with me or my healthcare representative as part of my discharge preparation.  The pharmacist that spoke with me during my hospital stay was:  Matraca Hunkins M Pranav Lince, RPH  WHY WAS XARELTO PRESCRIBED FOR YOU? Xarelto was prescribed to treat blood clots that may have been found in the veins of your legs (deep vein thrombosis) or in your lungs (pulmonary embolism) and to reduce the risk of them occurring again.  What do you need to know about Xarelto? The starting dose is one 15 mg tablet taken TWICE daily with food for the FIRST 21 DAYS then on (enter date)  01/30/2018  the dose is changed to one 20 mg tablet taken ONCE A DAY with your evening meal.  DO NOT stop taking Xarelto without talking to the health care provider who prescribed the medication.  Refill your prescription for 20 mg tablets before you run out.  After discharge, you should have regular check-up appointments with your healthcare provider that is prescribing your Xarelto.  In the future your dose may need to be changed if your kidney function changes by a significant amount.  What do you do if you miss a dose? If you are taking Xarelto TWICE DAILY and you miss a dose, take it as soon as you remember. You may take two 15 mg tablets (total 30 mg) at the same time then resume your regularly scheduled 15 mg twice daily the next day.  If you are taking Xarelto ONCE DAILY and you miss a dose, take it as soon as you remember on the same day then continue your regularly scheduled once daily regimen the next day. Do not take two doses of Xarelto at the same time.   Important Safety Information Xarelto is a blood thinner medicine that can cause bleeding. You should call your healthcare provider right away if you experience any of the following: ? Bleeding from an injury or your nose that does not stop. ? Unusual colored urine (red or dark brown) or  unusual colored stools (red or black). ? Unusual bruising for unknown reasons. ? A serious fall or if you hit your head (even if there is no bleeding).  Some medicines may interact with Xarelto and might increase your risk of bleeding while on Xarelto. To help avoid this, consult your healthcare provider or pharmacist prior to using any new prescription or non-prescription medications, including herbals, vitamins, non-steroidal anti-inflammatory drugs (NSAIDs) and supplements.  This website has more information on Xarelto: [email or web address]

## 2018-01-09 NOTE — Progress Notes (Signed)
[place] for Rivaroxaban  Indication: pulmonary embolus  Allergies  Allergen Reactions  . Penicillins Itching    Has patient had a PCN reaction causing immediate rash, facial/tongue/throat swelling, SOB or lightheadedness with hypotension: Yes Has patient had a PCN reaction causing severe rash involving mucus membranes or skin necrosis: No Has patient had a PCN reaction that required hospitalization: No Has patient had a PCN reaction occurring within the last 10 years: No If all of the above answers are "NO", then may proceed with Cephalosporin use.  . Sulfa Antibiotics Itching    Patient Measurements: Height: 5\' 2"  (157.5 cm) Weight: 227 lb 1.2 oz (103 kg) IBW/kg (Calculated) : 54.6 Heparin Dosing Weight: 75 kg  Vital Signs: Temp: 97.4 F (36.3 C) (01/17 0515) Temp Source: Oral (01/17 0515) BP: 109/79 (01/17 0515) Pulse Rate: 73 (01/17 0515)  Labs: Recent Labs    01/06/18 1354  01/06/18 1522 01/06/18 1609  01/08/18 0514 01/08/18 1251 01/09/18 0209  HGB  --    < > 17.1  --   --   --  16.9 15.1  HCT  --   --  50.6  --   --   --  50.9 44.9  PLT  --   --  333  --   --   --  322 317  APTT  --   --   --  25  --   --   --   --   LABPROT  --   --   --  13.6  --   --   --   --   INR  --   --   --  1.05  --   --   --   --   HEPARINUNFRC  --   --   --   --    < > 0.80* 0.54 0.29*  CREATININE 1.30*  --  1.25*  --   --   --   --  1.05  TROPONINI  --   --  <0.03  --   --   --   --   --    < > = values in this interval not displayed.    Estimated Creatinine Clearance: 62.6 mL/min (by C-G formula based on SCr of 1.05 mg/dL).   Medical History: Past Medical History:  Diagnosis Date  . Dyslipidemia   . Edema   . Gastric ulcer   . OSA on CPAP   . Prediabetes   . Seasonal allergies    Assessment: 77 y/o M admitted from pulmonology office with PE. Patient not on anticoagulation PTA. Patient was started on heparin drip. Now pharmacy has  been consulted for Rivaroxaban (Xarelto) dosing.     Plan:  Heparin drip stopped.  Will start Rivaroxaban15mg   BID x 21 days, followed by Rivaroxaban 20mg  daily there after.  Pharmacy to counsel patient prior to discharge.   Pernell Dupre, PharmD, BCPS Clinical Pharmacist 01/09/2018 8:43 AM

## 2018-01-09 NOTE — Progress Notes (Signed)
[facility]  Daily Progress Note   Subjective  - 1 Day Post-Op  Breathing better  Objective Vitals:   01/08/18 1645 01/08/18 1843 01/08/18 2040 01/09/18 0515  BP: 108/79 104/61 116/75 109/79  Pulse: 79 84 78 73  Resp: (!) 21 12 19    Temp:    (!) 97.4 F (36.3 C)  TempSrc:    Oral  SpO2: 94% 94% 99% 94%  Weight:      Height:        Intake/Output Summary (Last 24 hours) at 01/09/2018 1724 Last data filed at 01/09/2018 1100 Gross per 24 hour  Intake 360 ml  Output 1050 ml  Net -690 ml    PULM  Normal effort , no use of accessory muscles CV  No JVD, RRR Abd      No distended, nontender VASC  No hematoma  Laboratory CBC    Component Value Date/Time   WBC 5.5 01/09/2018 0209   HGB 15.1 01/09/2018 0209   HCT 44.9 01/09/2018 0209   PLT 317 01/09/2018 0209    BMET    Component Value Date/Time   NA 134 (L) 01/09/2018 0209   K 4.4 01/09/2018 0209   CL 104 01/09/2018 0209   CO2 21 (L) 01/09/2018 0209   GLUCOSE 147 (H) 01/09/2018 0209   BUN 21 (H) 01/09/2018 0209   CREATININE 1.05 01/09/2018 0209   CALCIUM 8.3 (L) 01/09/2018 0209   GFRNONAA >60 01/09/2018 0209   GFRAA >60 01/09/2018 0209    Assessment/Planning: POD #1  s/p pulmonary embolectomy   Doing well without shortness of breath  Hortencia Pilar  01/09/2018, 5:24 PM

## 2018-01-09 NOTE — Progress Notes (Signed)
* [facility]     Assessment and Plan:  Acute on chronic pulmonary embolism. Dyspnea on exertion. 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   - Continue anticoagulation. -Consulted vascular surgery to evaluate for thromboembolectomy, given chronic lower extremity edema and symptoms and signs of pulmonary hypertension.   Date: 01/09/2018  MRN# [id number] Adam Knight Sep 10, 1941   Adam Knight is a 77 y.o. old male seen in follow up for chief complaint of  Chief Complaint  Patient presents with  . Shortness of Breath     Subjective.  No new complaints today, patient continues to complain of dyspnea.  Medication:    Current Facility-Administered Medications:  .  0.9 %  sodium chloride infusion, 250 mL, Intravenous, PRN, Demetrios Loll, MD .  0.9 %  sodium chloride infusion, , Intravenous, Continuous, Schnier, Dolores Lory, MD, Last Rate: 75 mL/hr at 01/08/18 1433 .  0.9 %  sodium chloride infusion, 250 mL, Intravenous, PRN, Schnier, Dolores Lory, MD .  acetaminophen (TYLENOL) tablet 650 mg, 650 mg, Oral, Q6H PRN, 650 mg at 01/06/18 2148 **OR** acetaminophen (TYLENOL) suppository 650 mg, 650 mg, Rectal, Q6H PRN, Demetrios Loll, MD .  albuterol (PROVENTIL) (2.5 MG/3ML) 0.083% nebulizer solution 2.5 mg, 2.5 mg, Nebulization, Q2H PRN, Demetrios Loll, MD .  bisacodyl (DULCOLAX) EC tablet 5 mg, 5 mg, Oral, Daily PRN, Demetrios Loll, MD .  ethacrynic acid (EDECRIN) tablet 25 mg, 25 mg, Oral, BID, Demetrios Loll, MD .  fluticasone furoate-vilanterol (BREO ELLIPTA) 100-25 MCG/INH 1 puff, 1 puff, Inhalation, Daily, Demetrios Loll, MD .  HYDROcodone-acetaminophen (NORCO/VICODIN) 5-325 MG per tablet 1-2 tablet, 1-2 tablet, Oral, Q4H PRN, Demetrios Loll, MD .  ondansetron [facility]) tablet 4 mg, 4 mg, Oral, Q6H PRN **OR** ondansetron (ZOFRAN) injection 4 mg, 4 mg, Intravenous, Q6H PRN, Demetrios Loll, MD .   pantoprazole (PROTONIX) EC tablet 40 mg, 40 mg, Oral, Daily, Sudini, Srikar, MD, 40 mg at 01/07/18 1823 .  Rivaroxaban (XARELTO) tablet 15 mg, 15 mg, Oral, BID WC, Hallaji, Sheema M, RPH, 15 mg at 01/09/18 0949 .  [START ON 01/30/2018] rivaroxaban (XARELTO) tablet 20 mg, 20 mg, Oral, Daily, Hallaji, Sheema M, RPH .  senna-docusate (Senokot-S) tablet 1 tablet, 1 tablet, Oral, QHS PRN, Demetrios Loll, MD .  sodium chloride (OCEAN) 0.65 % nasal spray 1 spray, 1 spray, Each Nare, PRN, Demetrios Loll, MD .  sodium chloride flush (NS) 0.9 % injection 3 mL, 3 mL, Intravenous, Q12H, Demetrios Loll, MD, 3 mL at 01/08/18 2111 .  sodium chloride flush (NS) 0.9 % injection 3 mL, 3 mL, Intravenous, PRN, Demetrios Loll, MD .  sodium chloride flush (NS) 0.9 % injection 3 mL, 3 mL, Intravenous, Q12H, Schnier, Dolores Lory, MD, 3 mL at 01/09/18 0953 .  sodium chloride flush (NS) 0.9 % injection 3 mL, 3 mL, Intravenous, PRN, Schnier, Dolores Lory, MD .  spironolactone (ALDACTONE) tablet 50 mg, 50 mg, Oral, Daily, Sudini, Srikar, MD, 50 mg at 01/09/18 0630   Allergies:  Penicillins and Sulfa antibiotics  Review of Systems: Gen:  Denies  fever, sweats. HEENT: Denies blurred vision. Cvc:  No dizziness, chest pain or heaviness Resp:   Denies cough or sputum porduction. Gi: Denies swallowing difficulty, stomach pain. constipation, bowel incontinence Gu:  Denies bladder incontinence, burning urine Ext:   No Joint pain, stiffness. Skin: No skin rash, easy bruising. Endoc:  No polyuria, polydipsia. Psych: No depression, insomnia. Other:  All other systems  were reviewed and found to be negative other than what is mentioned in the HPI.   Physical Examination:   VS: BP 109/79 (BP Location: Right Arm)   Pulse 73   Temp (!) 97.4 F (36.3 C) (Oral)   Resp 19   Ht 5\' 2"  (1.575 m)   Wt 227 lb 1.2 oz (103 kg)   SpO2 94%   BMI 41.53 kg/m    General Appearance: No distress  Neuro:without focal findings,  speech normal,  HEENT:  PERRLA, EOM intact. Pulmonary: normal breath sounds, No wheezing.   CardiovascularNormal S1,S2.  No m/r/g.   Abdomen: Benign, Soft, non-tender. Renal:  No costovertebral tenderness  GU:  Not performed at this time. Endoc: No evident thyromegaly, no signs of acromegaly. Skin:   warm, no rash. Extremities: normal, no cyanosis, clubbing.   LABORATORY PANEL:   CBC Recent Labs  Lab 01/09/18 0209  WBC 5.5  HGB 15.1  HCT 44.9  PLT 317   ------------------------------------------------------------------------------------------------------------------  Chemistries  Recent Labs  Lab 01/06/18 1522 01/09/18 0209  NA 140 134*  K 4.5 4.4  CL 107 104  CO2 21* 21*  GLUCOSE 109* 147*  BUN 25* 21*  CREATININE 1.25* 1.05  CALCIUM 8.8* 8.3*  AST 90*  --   ALT 114*  --   ALKPHOS 78  --   BILITOT 0.9  --    ------------------------------------------------------------------------------------------------------------------  Cardiac Enzymes Recent Labs  Lab 01/06/18 1522  TROPONINI <0.03   ------------------------------------------------------------  RADIOLOGY:   No results found for this or any previous visit. No results found for this or any previous visit. ------------------------------------------------------------------------------------------------------------------  Thank  you for allowing [facility] to assist in the care of your patient. Our recommendations are noted above.  Please contact us if we can be of further service.   Marda Stalker, MD.  [facility] Office Number: [phone]  Patricia Pesa, M.D.  Merton Border, M.D  01/09/2018

## 2018-01-10 ENCOUNTER — Telehealth (INDEPENDENT_AMBULATORY_CARE_PROVIDER_SITE_OTHER): Payer: Self-pay

## 2018-01-10 ENCOUNTER — Encounter: Payer: Self-pay | Admitting: Vascular Surgery

## 2018-01-10 NOTE — Telephone Encounter (Signed)
Patient wife called to see if it's okay for the patient to take a bath/shower? And to see if it's okay for the patient to have his regularly scheduled acupuncture done today, she said that he's already gone to this appointment?

## 2018-01-10 NOTE — Telephone Encounter (Signed)
Yes he can shower. Yes he can do to his chiropractor.

## 2018-01-10 NOTE — Telephone Encounter (Signed)
Patient called having questions about why he was giving a prescription for Xarelto15mg  fo 21days by Dr Yolande Jolly stated that was giving a coupon for 30 day supply and he was wondering why he was only taking the medication for 21 days.I saw in the discharge summary that he is on Xarelto 15mg  for 21 days but should start Xarelto 20mg  01/30/18.I also inform the patient his appointment with GS is 01/23/18 in the office.

## 2018-01-13 ENCOUNTER — Telehealth (INDEPENDENT_AMBULATORY_CARE_PROVIDER_SITE_OTHER): Payer: Self-pay

## 2018-01-13 NOTE — Telephone Encounter (Signed)
The patient's wife reported that he had already gone to the chiropractor, but that he would start back getting his bath.

## 2018-01-13 NOTE — Telephone Encounter (Signed)
Patient called stating that he has red spots and brusies on both arms and was thinking it might be coming from the [place]. I spoke KS and she advise for patient to take benadryl but the wife stated that he could not tolerate that well.I spoke with GS and he wanted the patient to take Claritin for 1 week to see if that helps.I inform the patient if symptoms become worse to call the office.The patient does have a appt on 01/23/18 with GS

## 2018-01-16 ENCOUNTER — Telehealth: Payer: Self-pay | Admitting: Internal Medicine

## 2018-01-16 NOTE — Telephone Encounter (Signed)
Please advise on message below.

## 2018-01-16 NOTE — Telephone Encounter (Signed)
Ethacrynic acid should be discontinued,  It is no longer necessary after finding that he had a PE. He can continue on his previous medications. Will discuss further with him at next visit

## 2018-01-16 NOTE — Telephone Encounter (Signed)
Patient calling in regards to medication ethacrynic tablet 25 MG he was prescribed at last visit He says that it was not under his drug plan Pharmacy refilled his spironolactone instead Would like to discuss with nurse

## 2018-01-17 NOTE — Telephone Encounter (Signed)
LMOM for pt to return call. 

## 2018-01-17 NOTE — Telephone Encounter (Signed)
LMOM for pt to return call to discuss medication.

## 2018-01-20 NOTE — Telephone Encounter (Signed)
Does patient still need to have labs drawn in 2 weeks?

## 2018-01-20 NOTE — Telephone Encounter (Signed)
Pt aware. Nothing further needed 

## 2018-01-20 NOTE — Telephone Encounter (Signed)
Pt would like a call back, states he has something he needs to clarify from the previous conversation.

## 2018-01-20 NOTE — Telephone Encounter (Signed)
No, no labs necessary since we did not start him on the ethacrynic acid.

## 2018-01-20 NOTE — Telephone Encounter (Signed)
Informed pt of DRs response. Nothing further needed.

## 2018-01-22 ENCOUNTER — Inpatient Hospital Stay: Admission: RE | Admit: 2018-01-22 | Payer: Medicare Other | Source: Ambulatory Visit

## 2018-01-23 ENCOUNTER — Encounter (INDEPENDENT_AMBULATORY_CARE_PROVIDER_SITE_OTHER): Payer: Self-pay | Admitting: Vascular Surgery

## 2018-01-23 ENCOUNTER — Ambulatory Visit (INDEPENDENT_AMBULATORY_CARE_PROVIDER_SITE_OTHER): Payer: Medicare Other | Admitting: Vascular Surgery

## 2018-01-23 VITALS — BP 139/87 | HR 100 | Resp 18 | Ht 62.0 in | Wt 221.0 lb

## 2018-01-23 DIAGNOSIS — I2602 Saddle embolus of pulmonary artery with acute cor pulmonale: Secondary | ICD-10-CM

## 2018-01-23 DIAGNOSIS — G4733 Obstructive sleep apnea (adult) (pediatric): Secondary | ICD-10-CM | POA: Diagnosis not present

## 2018-01-23 DIAGNOSIS — Z9989 Dependence on other enabling machines and devices: Secondary | ICD-10-CM

## 2018-01-25 ENCOUNTER — Encounter (INDEPENDENT_AMBULATORY_CARE_PROVIDER_SITE_OTHER): Payer: Self-pay | Admitting: Vascular Surgery

## 2018-01-25 NOTE — Progress Notes (Signed)
MRN : [id number]  Adam Knight is a 77 y.o. (07-03-1941) male who presents with chief complaint of  Chief Complaint  Patient presents with  . New Patient (Initial Visit)    2 week [facility] f/u  .  History of Present Illness:  The patient presents to the office for evaluation of DVT.  DVT was identified at [facility] by Duplex ultrasound.  The initial symptoms were pain and swelling in the lower extremity.  The patient notes the leg continues to be very painful with dependency and swells quite a bite.  Symptoms are much better with elevation.  The patient notes minimal edema in the morning which steadily worsens throughout the day.    The patient has not been using compression therapy at this point.  No SOB or pleuritic chest pains.  No cough or hemoptysis.  No blood per rectum or blood in any sputum.  No excessive bruising per the patient.      Current Meds  Medication Sig  . Calcium Carbonate Antacid (CALCIUM CARBONATE, DOSED IN MG ELEMENTAL CALCIUM,) 1250 MG/5ML SUSP Take by mouth.  . Cholecalciferol (VITAMIN D) 2000 UNITS tablet Take 2,000 Units by mouth daily.  . fluorouracil (EFUDEX) 5 % cream   . Rivaroxaban (XARELTO) 15 MG TABS tablet Take 1 tablet (15 mg total) by mouth 2 (two) times daily with a meal for 21 days.  Marland Kitchen spironolactone (ALDACTONE) 25 MG tablet Take 25 mg by mouth 2 (two) times daily.    Past Medical History:  Diagnosis Date  . Dyslipidemia   . Edema   . Gastric ulcer   . OSA on CPAP   . Prediabetes   . Seasonal allergies     Past Surgical History:  Procedure Laterality Date  . PULMONARY VENOGRAPHY Bilateral 01/08/2018   Procedure: PULMONARY VENOGRAPHY possible thrombolysis;  Surgeon: Katha Cabal, MD;  Location: [facility];  Service: Cardiovascular;  Laterality: Bilateral;  . TONSILLECTOMY      Social History Social History   Tobacco Use  . Smoking status: Former Smoker    Packs/day: 0.30    Years: 5.00    Pack years: 1.50   Types: Cigarettes  . Smokeless tobacco: Never Used  Substance Use Topics  . Alcohol use: No    Alcohol/week: 0.0 oz  . Drug use: No    Family History Family History  Problem Relation Age of Onset  . Stroke Mother   . Coronary artery disease Mother   . Osteoporosis Mother   . Rheum arthritis Mother   . Thyroid disease Mother     Allergies  Allergen Reactions  . Penicillins Itching    Has patient had a PCN reaction causing immediate rash, facial/tongue/throat swelling, SOB or lightheadedness with hypotension: Yes Has patient had a PCN reaction causing severe rash involving mucus membranes or skin necrosis: No Has patient had a PCN reaction that required hospitalization: No Has patient had a PCN reaction occurring within the last 10 years: No If all of the above answers are "NO", then may proceed with Cephalosporin use.  . Sulfa Antibiotics Itching     REVIEW OF SYSTEMS (Negative unless checked)  Constitutional: [] Weight loss  [] Fever  [] Chills Cardiac: [] Chest pain   [] Chest pressure   [] Palpitations   [] Shortness of breath when laying flat   [] Shortness of breath with exertion. Vascular:  [] Pain in legs with walking   [] Pain in legs at rest  [x] History of DVT   [x] Phlebitis   [] Swelling in legs   []   Varicose veins   [] Non-healing ulcers Pulmonary:   [] Uses home oxygen   [] Productive cough   [] Hemoptysis   [] Wheeze  [] COPD   [] Asthma Neurologic:  [] Dizziness   [] Seizures   [] History of stroke   [] History of TIA  [] Aphasia   [] Vissual changes   [] Weakness or numbness in arm   [] Weakness or numbness in leg Musculoskeletal:   [] Joint swelling   [] Joint pain   [] Low back pain Hematologic:  [] Easy bruising  [] Easy bleeding   [] Hypercoagulable state   [] Anemic Gastrointestinal:  [] Diarrhea   [] Vomiting  [] Gastroesophageal reflux/heartburn   [] Difficulty swallowing. Genitourinary:  [] Chronic kidney disease   [] Difficult urination  [] Frequent urination   [] Blood in urine Skin:  [] Rashes    [] Ulcers  Psychological:  [] History of anxiety   []  History of major depression.  Physical Examination  Vitals:   01/23/18 0919  BP: 139/87  Pulse: 100  Resp: 18  Weight: 221 lb (100.2 kg)  Height: 5\' 2"  (1.575 m)   Body mass index is 40.42 kg/m. Gen: WD/WN, NAD Head: [place]/AT, No temporalis wasting.  Ear/Nose/Throat: Hearing grossly intact, nares w/o erythema or drainage Eyes: PER, EOMI, sclera nonicteric.  Neck: Supple, no large masses.   Pulmonary:  Good air movement, no audible wheezing bilaterally, no use of accessory muscles.  Cardiac: RRR, no JVD Vascular: scattered varicosities present bilaterally.  Mild venous stasis changes to the legs bilaterally.  3+ soft pitting edema Vessel Right Left  Radial Palpable Palpable  PT Palpable Palpable  DP Palpable Palpable  Gastrointestinal: Non-distended. No guarding/no peritoneal signs.  Musculoskeletal: M/S 5/5 throughout.  No deformity or atrophy.  Neurologic: CN 2-12 intact. Symmetrical.  Speech is fluent. Motor exam as listed above. Psychiatric: Judgment intact, Mood & affect appropriate for pt's clinical situation. Dermatologic: venous rashes no ulcers noted.  No changes consistent with cellulitis. Lymph : No lichenification or skin changes of chronic lymphedema.  CBC Lab Results  Component Value Date   WBC 5.5 01/09/2018   HGB 15.1 01/09/2018   HCT 44.9 01/09/2018   MCV 90.5 01/09/2018   PLT 317 01/09/2018    BMET    Component Value Date/Time   NA 134 (L) 01/09/2018 0209   K 4.4 01/09/2018 0209   CL 104 01/09/2018 0209   CO2 21 (L) 01/09/2018 0209   GLUCOSE 147 (H) 01/09/2018 0209   BUN 21 (H) 01/09/2018 0209   CREATININE 1.05 01/09/2018 0209   CALCIUM 8.3 (L) 01/09/2018 0209   GFRNONAA >60 01/09/2018 0209   GFRAA >60 01/09/2018 0209   Estimated Creatinine Clearance: 61.6 mL/min (by C-G formula based on SCr of 1.05 mg/dL).  COAG Lab Results  Component Value Date   INR 1.05 01/06/2018    Radiology Ct  Angio Chest Pe W Or Wo Contrast  Result Date: 01/06/2018 CLINICAL DATA:  Shortness of breath for 3 years which has worsened over the past week. EXAM: CT ANGIOGRAPHY CHEST WITH CONTRAST TECHNIQUE: Multidetector CT imaging of the chest was performed using the standard protocol during bolus administration of intravenous contrast. Multiplanar CT image reconstructions and MIPs were obtained to evaluate the vascular anatomy. CONTRAST:  75 ml ISOVUE-370 IOPAMIDOL (ISOVUE-370) INJECTION 76% COMPARISON:  None. FINDINGS: Cardiovascular: The examination is positive for bilateral pulmonary emboli. Several of the emboli are adherent to the vessel wall compatible with chronic change but others appear to occlude the vessel lumen suggesting acute PE. The RV/LV ratio is 1.6 consistent with right heart strain. Note is made that the main pulmonary  artery measures slightly larger than the ascending aorta suggestive of pulmonary arterial hypertension. Heart size is normal. No pericardial effusion. No notable calcific atherosclerosis. Mediastinum/Nodes: No enlarged mediastinal, hilar, or axillary lymph nodes. Thyroid gland, trachea, and esophagus demonstrate no significant findings. Lungs/Pleura: No pleural effusion. A solitary right lower lobe nodule measures 0.5 cm on image 40 of series 6. No other nodules identified. Upper Abdomen: Parapelvic right renal cyst noted. Musculoskeletal: No acute or focal abnormality. Review of the MIP images confirms the above findings. IMPRESSION: The study is positive for pulmonary embolus. 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 Right heart strain may be chronic without evidence of pulmonary arterial hypertension present on the examination. 0.5 cm right lower lobe pulmonary nodule. No follow-up needed if patient is low-risk. Non-contrast chest CT can be considered in 12 months if patient is high-risk. This recommendation follows the consensus statement: Guidelines for Management  of Incidental Pulmonary Nodules Detected on CT Images: From the Fleischner Society 2017; Radiology 2017; 284:228-243. Critical Value/emergent results were called by telephone at the time of interpretation on 01/06/2018 at 2:44 pm to Dr. Laverle Hobby , who verbally acknowledged these results. Electronically Signed   By: Inge Rise M.D.   On: 01/06/2018 14:51   US Venous Img Lower Bilateral  Result Date: 01/07/2018 CLINICAL DATA:  Pulmonary emboli, leg swelling, edema EXAM: BILATERAL LOWER EXTREMITY VENOUS DOPPLER ULTRASOUND TECHNIQUE: Gray-scale sonography with graded compression, as well as color Doppler and duplex ultrasound were performed to evaluate the lower extremity deep venous systems from the level of the common femoral vein and including the common femoral, femoral, profunda femoral, popliteal and calf veins including the posterior tibial, peroneal and gastrocnemius veins when visible. The superficial great saphenous vein was also interrogated. Spectral Doppler was utilized to evaluate flow at rest and with distal augmentation maneuvers in the common femoral, femoral and popliteal veins. COMPARISON:  None. FINDINGS: RIGHT LOWER EXTREMITY Common Femoral Vein: No evidence of thrombus. Normal compressibility, respiratory phasicity and response to augmentation. Saphenofemoral Junction: No evidence of thrombus. Normal compressibility and flow on color Doppler imaging. Profunda Femoral Vein: No evidence of thrombus. Normal compressibility and flow on color Doppler imaging. Femoral Vein: No evidence of thrombus. Normal compressibility, respiratory phasicity and response to augmentation. Popliteal Vein: No evidence of thrombus. Normal compressibility, respiratory phasicity and response to augmentation. Calf Veins: No evidence of thrombus. Normal compressibility and flow on color Doppler imaging. Superficial Great Saphenous Vein: No evidence of thrombus. Normal compressibility. Venous Reflux:  None.  Other Findings:  None. LEFT LOWER EXTREMITY Common Femoral Vein: No evidence of thrombus. Normal compressibility, respiratory phasicity and response to augmentation. Saphenofemoral Junction: No evidence of thrombus. Normal compressibility and flow on color Doppler imaging. Profunda Femoral Vein: No evidence of thrombus. Normal compressibility and flow on color Doppler imaging. Femoral Vein: No evidence of thrombus. Normal compressibility, respiratory phasicity and response to augmentation. Popliteal Vein: No evidence of thrombus. Normal compressibility, respiratory phasicity and response to augmentation. Calf Veins: No evidence of thrombus. Normal compressibility and flow on color Doppler imaging. Superficial Great Saphenous Vein: No evidence of thrombus. Normal compressibility. Venous Reflux:  None. Other Findings:  None. IMPRESSION: No evidence of deep venous thrombosis. Electronically Signed   By: Jerilynn Mages.  Shick M.D.   On: 01/07/2018 10:07    Assessment/Plan 1. Acute saddle pulmonary embolism with acute cor pulmonale (HCC) Recommend:   No surgery or intervention at this point in time.  IVC filter is not indicated at present.  Patient's duplex ultrasound of the venous system shows DVT from the popliteal to the femoral veins.  The patient is initiated on anticoagulation   Elevation was stressed, use of a recliner was discussed.  I have had a long discussion with the patient regarding DVT and post phlebitic changes such as swelling and why it  causes symptoms such as pain.  The patient will wear graduated compression stockings class 1 (20-30 mmHg), beginning after three full days of anticoagulation, on a daily basis a prescription was given. The patient will  beginning wearing the stockings first thing in the morning and removing them in the evening. The patient is instructed specifically not to sleep in the stockings.  In addition, behavioral modification including elevation during the day and avoidance  of prolonged dependency will be initiated.    The patient will continue anticoagulation for now as there have not been any problems or complications at this point.    2. OSA on CPAP Continue pulmonary medications and aerosols as already ordered, these medications have been reviewed and there are no changes at this time.  Continue CPAP      Hortencia Pilar, MD  01/25/2018 8:25 PM

## 2018-01-27 ENCOUNTER — Telehealth (INDEPENDENT_AMBULATORY_CARE_PROVIDER_SITE_OTHER): Payer: Self-pay

## 2018-01-27 NOTE — Telephone Encounter (Signed)
Im not sure what the conversation Dr. Delana Meyer had with the patient but Xarelto 20mg  PO daily is the correct dose.

## 2018-01-27 NOTE — Telephone Encounter (Signed)
Patient called to clarify that he is supposed to start taking the Xarelto 20mg  on 01/30/18, per a conversation he and Dr. Delana Meyer had upon his discharge form the hospital.  FYI--According to his chart and medications, this is correct and the medication has already been ordered. The patient is saying that the pharmacy will not issue him the medication, but I told him it may be due to the date that he went to try and have it filled. He's going to try back on 01/30/18 and see if they will refill the medication at that time.

## 2018-01-28 NOTE — Telephone Encounter (Signed)
Patient's wife called confused regarding the patient taking the Xarelto, she felt he was taking to much. I explained after looking at the medication and when it was dispensed, the patient is to take 15mg  twice daily until 01/30/18 and then start taking 20mg  once daily from that day forward. The patient prescriptions were also sent into the wrong pharmacy per the wife so she will call [facility] and have them send the prescription to their preferred pharmacy [facility]. The wife seemed less confused after we talked.

## 2018-02-03 ENCOUNTER — Inpatient Hospital Stay: Admission: RE | Admit: 2018-02-03 | Payer: Medicare Other | Source: Ambulatory Visit | Admitting: Orthopedic Surgery

## 2018-02-03 ENCOUNTER — Encounter: Admission: RE | Payer: Self-pay | Source: Ambulatory Visit

## 2018-02-03 SURGERY — ARTHROPLASTY, KNEE, TOTAL, USING IMAGELESS COMPUTER-ASSISTED NAVIGATION
Anesthesia: Choice | Laterality: Left

## 2018-02-06 NOTE — Progress Notes (Signed)
[facility] Consultation      Assessment and Plan:   Pulmonary hypertension, group 4;  secondary to chronic thromboembolic disease. -Status post thrombectomy. - Patient would like to get an consider stopping anticoagulation, I suggested that stay on anticoagulation for at least 3-6 months, and then we can consider stopping.  Chronic dyspnea on exertion, multifactorial. - Secondary to pulmonary hypertension, morbid obesity, deconditioning. -Recommend increase physical activity and weight loss. - We discussed the possibility of chronic bronchitis contributing to dyspnea, we will try Spiriva inhaler empirically to see if this helps if it does not help we can stop it.  Meds ordered this encounter  Medications  . tiotropium (SPIRIVA HANDIHALER) 18 MCG inhalation capsule    Sig: Place 1 capsule (18 mcg total) into inhaler and inhale daily.    Dispense:  30 capsule    Refill:  2     Return in about 3 months (around 05/07/2018).    Date: 02/06/2018  MRN# [id number] Adam Knight 18-Nov-1941   Adam Knight is a 77 y.o. old male seen in consultation for chief complaint of:    Chief Complaint  Patient presents with  . Shortness of Breath    w/activity;     HPI:   Patient is a 77 year old male with a history of chronic edema, obstructive sleep apnea, peripheral vascular disease, active smoking.  Last visit it was noted that he had signs and symptoms of pulmonary hypertension possibly chronic thromboembolic disease.  He was sent for CT of the chest which showed pulmonary embolism, subsequently the patient was admitted and was started on anticoagulation.  He was seen by vascular surgery who performed a catheterization, and demonstrated low flow with filling defects, he underwent thrombectomy of the apical and basilar segments of the right pulmonary artery.  His dyspnea has improved since his hospitalization. He is on no inhalers. He is maintained on xarelto. He is  upset that he can not do acupuncture anymore. His wife told him that the xarelto can kill him and he read on the internet that he can bleed to death; he is wondering if he can stop it.  He is worried because his wife told him that he needs to stop using blood thinners.   He does not exercise, he used to walk half hour per day and stopped 2 months ago because of dyspnea LE edema. H saw a vascular doctor in [facility] and had an [place] and was told to use stockings.  He last smoked about 50 years ago. His dad smoked.  He worked [employer], he lived in [place].  Denies reflux.  Denies sinus drainage.  He uses CPAP every day.   **Spirometry 08/22/17; FVC is 116% predicted, FEV1 is 111% predicted, ratio is 74%.  No bronchodilator was given. Flow volume loop is normal. -Overall assist test is consistent with normal pulmonary functions.  **Chest x-ray report 08/22/17, images not available chronic bronchitic changes, interstitial prominence.  Enlarged pulmonary arteries, otherwise no acute changes.  **Echocardiogram 04/23/17 pulmonary:[facility]          No PS INTERPRETATION NORMAL LEFT VENTRICULAR SYSTOLIC FUNCTION WITH AN ESTIMATED EF = 55 % NORMAL RIGHT VENTRICULAR SYSTOLIC FUNCTION MILD TRICUSPID VALVE INSUFFICIENCY TRIVIAL MITRAL AND AORTIC VALVE INSUFFICIENCY NO VALVULAR STENOSIS MILD RV ENLARGEMENT  **Desat walk 12/12/17 SPO2 At Rest On Room Air: 90% SPO2 With Exertion On Room Air: 87% SPO2 On Oxygen 2 LPM With Exertion: 92%    Allergies:  Penicillins and Sulfa antibiotics  Review of Systems: Gen:  Denies  fever, sweats, chills HEENT: Denies blurred vision, double vision. bleeds, sore throat Cvc:  No dizziness, chest pain. Resp:   Denies cough or sputum production. Gi: Denies swallowing difficulty, stomach pain. Gu:  Denies bladder incontinence, burning urine Ext:   No Joint pain, stiffness. Skin: No skin rash,  hives  Endoc:  No polyuria,  polydipsia. Psych: No depression, insomnia. Other:  All other systems were reviewed with the patient and were negative other that what is mentioned in the HPI.   Physical Examination:   VS: BP (!) 150/100 (BP Location: Left Arm, Cuff Size: Normal)   Pulse (!) 101   Ht 5\' 2"  (1.575 m)   Wt 217 lb (98.4 kg)   SpO2 90%   BMI 39.69 kg/m   General Appearance: No distress  Neuro:without focal findings,  speech normal,  HEENT: PERRLA, EOM intact.   Pulmonary: normal breath sounds, No wheezing.  CardiovascularNormal S1,S2.  No m/r/g.   Abdomen: Benign, Soft, non-tender. Renal:  No costovertebral tenderness  GU:  No performed at this time. Endoc: No evident thyromegaly, no signs of acromegaly. Skin:   warm, no rashes, no ecchymosis  Extremities: normal, no cyanosis, clubbing.  Other findings:    LABORATORY PANEL:   CBC No results for input(s): WBC, HGB, HCT, PLT in the last 168 hours. ------------------------------------------------------------------------------------------------------------------  Chemistries  No results for input(s): NA, K, CL, CO2, GLUCOSE, BUN, CREATININE, CALCIUM, MG, AST, ALT, ALKPHOS, BILITOT in the last 168 hours.  Invalid input(s): GFRCGP ------------------------------------------------------------------------------------------------------------------  Cardiac Enzymes No results for input(s): TROPONINI in the last 168 hours. ------------------------------------------------------------  RADIOLOGY:  No results found.     Thank  you for the consultation and for allowing [facility] to assist in the care of your patient. Our recommendations are noted above.  Please contact us if we can be of further service.   Marda Stalker, MD.  Board Certified in Internal Medicine, Pulmonary Medicine, [place], and Sleep Medicine.  [facility] Office Number: [phone]  Patricia Pesa, M.D.   Merton Border, M.D  02/06/2018

## 2018-02-07 ENCOUNTER — Encounter: Payer: Self-pay | Admitting: Internal Medicine

## 2018-02-07 ENCOUNTER — Ambulatory Visit (INDEPENDENT_AMBULATORY_CARE_PROVIDER_SITE_OTHER): Payer: Medicare Other | Admitting: Internal Medicine

## 2018-02-07 VITALS — BP 150/100 | HR 101 | Ht 62.0 in | Wt 217.0 lb

## 2018-02-07 DIAGNOSIS — Z6841 Body Mass Index (BMI) 40.0 and over, adult: Secondary | ICD-10-CM

## 2018-02-07 DIAGNOSIS — R0609 Other forms of dyspnea: Secondary | ICD-10-CM | POA: Diagnosis not present

## 2018-02-07 DIAGNOSIS — R0602 Shortness of breath: Secondary | ICD-10-CM | POA: Diagnosis not present

## 2018-02-07 DIAGNOSIS — G4733 Obstructive sleep apnea (adult) (pediatric): Secondary | ICD-10-CM | POA: Diagnosis not present

## 2018-02-07 DIAGNOSIS — Z9989 Dependence on other enabling machines and devices: Secondary | ICD-10-CM | POA: Diagnosis not present

## 2018-02-07 MED ORDER — TIOTROPIUM BROMIDE MONOHYDRATE 18 MCG IN CAPS: 18.0000 ug | ORAL_CAPSULE | Freq: Every day | RESPIRATORY_TRACT | 2 refills | Status: DC

## 2018-02-07 NOTE — Patient Instructions (Addendum)
Your have WHO Group 4 Pulmonary Hypertension. WHO group 4 pulmonary hypertension is PH related to chronic blood clots in the arteries of the lungs. The treatment is removal of the blood clot and blood thinner like xarelto.  If you stop blood thinner you have are at risk of developing more blood clots that could kill you.   The best thing you can do for your breathing is to lose weight and increase your exercise.   Will start you on an inhaler to see if this helps with your breathing. You do not have to take it if you feel that it is not helping.

## 2018-02-10 ENCOUNTER — Telehealth: Payer: Self-pay | Admitting: *Deleted

## 2018-02-10 NOTE — Telephone Encounter (Signed)
Spiriva handihaler is not on formulary for 2019 insurance. Patient has been given a temporary supply. Inhaler will be changed to Incruse.

## 2018-02-14 ENCOUNTER — Telehealth: Payer: Self-pay | Admitting: Internal Medicine

## 2018-02-14 NOTE — Telephone Encounter (Signed)
Patient's wife wants Xarelto changed to Coumadin. She has been made aware that we do not manage coumadin therapy here and he would be referred out for further management..  She is just concerned because patient had a nose bleed on Thursday night along with the bruise that started bleeding.  Please advise.

## 2018-02-14 NOTE — Telephone Encounter (Signed)
Pt spouse calling stating pt has a bruise on his arm She states there is a scab on that bruise, she is concerned about this.   She states he is on Xarelto, Dr Ashby Dawes prescribed it  Would like some advise on this

## 2018-02-14 NOTE — Telephone Encounter (Signed)
It is not uncommon to have bruising that is more than usual while on Xarelto. Nonetheless, it is extremely important that he continue the medication since he has had a blood clot to his lungs which can be lifethreatening if not treated

## 2018-02-17 NOTE — Telephone Encounter (Signed)
LMTCB

## 2018-02-17 NOTE — Telephone Encounter (Signed)
He can change to coumadin, but he would need to go to his PCP to order it, since we do manage monitoring of levels.

## 2018-02-18 DIAGNOSIS — Z7901 Long term (current) use of anticoagulants: Secondary | ICD-10-CM | POA: Insufficient documentation

## 2018-02-18 NOTE — Telephone Encounter (Signed)
2nd message left for patient's spouse to return call.

## 2018-04-17 NOTE — Progress Notes (Signed)
[facility] Consultation      Assessment and Plan:   Pulmonary hypertension, group 4;  secondary to chronic thromboembolic disease. -Status post thrombectomy. -Discussed with patient and his wife today, patient would be risk of recurrent thromboembolic disease if he stopped anticoagulation, however patient's wife is very worried about patient being on Xarelto and him bleeding out while on this medication.  We discussed the pros and cons of stopping anticoagulation versus continuing. - Patient would like to be switched to Coumadin, does not want to be managed by her primary care physician's office, they are requesting a referral to cardiology to manage the patient's Coumadin, and continued work-up of dyspnea.  Chronic dyspnea on exertion, multifactorial. - Secondary to morbid obesity, deconditioning. -Recommend increase physical activity and weight loss. - Patient was tried in the past on Spiriva empirically with no improvement.  Obstructive sleep apnea. -Currently on CPAP, to continue.  Orders Placed This Encounter  Procedures  . Ambulatory referral to Cardiology     Return in about 6 months (around 10/18/2018).    Date: 04/17/2018  MRN# [id number] Adam Knight 30-Mar-1941   Adam Knight is a 77 y.o. old male seen in consultation for chief complaint of:    Chief Complaint  Patient presents with  . Follow-up    PE: referred by pcp for bleeding sore that he doesn't know how it happened.    HPI:   Patient is a 77 year old male with a history of chronic edema, obstructive sleep apnea, peripheral vascular disease, active smoking.  Last visit he was being maintained on Xarelto, due to pulmonary embolism found on 01/06/18.  He was considering changing over to Coumadin.  Since his last visit he feels that his breathing has been doing well. He he notes that he has had a few episodes of bruising and then he has had which led to bleeding. He  walks about half  hour per day, he still gets dyspnea with walking long distances or an incline.   He is doing well with CPAP, he uses it every night and is feeling better with less daytime sleepiness.   He does not exercise, he used to walk half hour per day and stopped 2 months ago because of dyspnea LE edema. H saw a vascular doctor in [facility] and had an [place] and was told to use stockings.  He last smoked about 50 years ago. His dad smoked.  He worked [employer], he lived in [place].  Denies reflux.  Denies sinus drainage.  He uses CPAP every day.   **Spirometry 08/22/17; FVC is 116% predicted, FEV1 is 111% predicted, ratio is 74%.  No bronchodilator was given. Flow volume loop is normal. -Overall assist test is consistent with normal pulmonary functions.  **Chest x-ray report 08/22/17, images not available chronic bronchitic changes, interstitial prominence.  Enlarged pulmonary arteries, otherwise no acute changes.  **Echocardiogram 04/23/17 pulmonary:TRIVIAL PR          No PS INTERPRETATION NORMAL LEFT VENTRICULAR SYSTOLIC FUNCTION WITH AN ESTIMATED EF = 55 % NORMAL RIGHT VENTRICULAR SYSTOLIC FUNCTION MILD TRICUSPID VALVE INSUFFICIENCY TRIVIAL MITRAL AND AORTIC VALVE INSUFFICIENCY NO VALVULAR STENOSIS MILD RV ENLARGEMENT  **Desat walk 12/12/17 SPO2 At Rest On Room Air: 90% SPO2 With Exertion On Room Air: 87% SPO2 On Oxygen 2 LPM With Exertion: 92%    Allergies:  Penicillins and Sulfa antibiotics  Review of Systems: Gen:  Denies  fever, sweats, chills HEENT: Denies blurred vision, double vision. bleeds, sore throat Cvc:  No dizziness, chest pain. Resp:   Denies cough or sputum production. Gi: Denies swallowing difficulty, stomach pain. Gu:  Denies bladder incontinence, burning urine Ext:   No Joint pain, stiffness. Skin: No skin rash,  hives  Endoc:  No polyuria, polydipsia. Psych: No depression, insomnia. Other:  All other systems were reviewed with the  patient and were negative other that what is mentioned in the HPI.   Physical Examination:   VS: BP 134/78 (BP Location: Left Arm, Cuff Size: Large)   Pulse 99   Resp 16   Ht 5\' 2"  (1.575 m)   Wt 219 lb (99.3 kg)   SpO2 93%   BMI 40.06 kg/m   General Appearance: No distress  Neuro:without focal findings,  speech normal,  HEENT: PERRLA, EOM intact.   Pulmonary: normal breath sounds, No wheezing.  CardiovascularNormal S1,S2.  No m/r/g.   Abdomen: Benign, Soft, non-tender. Renal:  No costovertebral tenderness  GU:  No performed at this time. Endoc: No evident thyromegaly, no signs of acromegaly. Skin:   warm, no rashes, no ecchymosis  Extremities: normal, no cyanosis, clubbing.  Other findings:    LABORATORY PANEL:   CBC No results for input(s): WBC, HGB, HCT, PLT in the last 168 hours. ------------------------------------------------------------------------------------------------------------------  Chemistries  No results for input(s): NA, K, CL, CO2, GLUCOSE, BUN, CREATININE, CALCIUM, MG, AST, ALT, ALKPHOS, BILITOT in the last 168 hours.  Invalid input(s): GFRCGP ------------------------------------------------------------------------------------------------------------------  Cardiac Enzymes No results for input(s): TROPONINI in the last 168 hours. ------------------------------------------------------------  RADIOLOGY:  No results found.     Thank  you for the consultation and for allowing [facility] to assist in the care of your patient. Our recommendations are noted above.  Please contact us if we can be of further service.   Marda Stalker, MD.  Board Certified in Internal Medicine, Pulmonary Medicine, [place], and Sleep Medicine.  [facility] Office Number: [phone]  Patricia Pesa, M.D.  Merton Border, M.D  04/17/2018

## 2018-04-18 ENCOUNTER — Encounter: Payer: Self-pay | Admitting: Internal Medicine

## 2018-04-18 ENCOUNTER — Ambulatory Visit (INDEPENDENT_AMBULATORY_CARE_PROVIDER_SITE_OTHER): Payer: Medicare Other | Admitting: Internal Medicine

## 2018-04-18 VITALS — BP 134/78 | HR 99 | Resp 16 | Ht 62.0 in | Wt 219.0 lb

## 2018-04-18 DIAGNOSIS — R0609 Other forms of dyspnea: Secondary | ICD-10-CM

## 2018-04-18 DIAGNOSIS — Z9989 Dependence on other enabling machines and devices: Secondary | ICD-10-CM | POA: Diagnosis not present

## 2018-04-18 DIAGNOSIS — I2699 Other pulmonary embolism without acute cor pulmonale: Secondary | ICD-10-CM

## 2018-04-18 DIAGNOSIS — Z7901 Long term (current) use of anticoagulants: Secondary | ICD-10-CM

## 2018-04-18 DIAGNOSIS — G4733 Obstructive sleep apnea (adult) (pediatric): Secondary | ICD-10-CM | POA: Diagnosis not present

## 2018-04-18 NOTE — Patient Instructions (Addendum)
CTEPH, is chronic thromboembolic pulmonary hypertension. He is at risk for developing this if he stops blood thinners.  I would recommend that you stay on the blood thinner to reduce your risk of recurrent blood clots.   We will refer you to a cardiologist to manage Coumadin.

## 2018-05-01 ENCOUNTER — Ambulatory Visit (INDEPENDENT_AMBULATORY_CARE_PROVIDER_SITE_OTHER): Payer: Medicare Other | Admitting: Internal Medicine

## 2018-05-01 ENCOUNTER — Encounter: Payer: Self-pay | Admitting: *Deleted

## 2018-05-01 ENCOUNTER — Encounter: Payer: Self-pay | Admitting: Internal Medicine

## 2018-05-01 ENCOUNTER — Ambulatory Visit (INDEPENDENT_AMBULATORY_CARE_PROVIDER_SITE_OTHER): Payer: Medicare Other

## 2018-05-01 ENCOUNTER — Encounter

## 2018-05-01 VITALS — BP 120/82 | HR 82 | Ht 62.0 in | Wt 217.8 lb

## 2018-05-01 DIAGNOSIS — I2724 Chronic thromboembolic pulmonary hypertension: Secondary | ICD-10-CM

## 2018-05-01 DIAGNOSIS — R002 Palpitations: Secondary | ICD-10-CM

## 2018-05-01 DIAGNOSIS — I2609 Other pulmonary embolism with acute cor pulmonale: Secondary | ICD-10-CM | POA: Diagnosis not present

## 2018-05-01 DIAGNOSIS — R0789 Other chest pain: Secondary | ICD-10-CM | POA: Diagnosis not present

## 2018-05-01 DIAGNOSIS — I2782 Chronic pulmonary embolism: Secondary | ICD-10-CM | POA: Diagnosis not present

## 2018-05-01 MED ORDER — RIVAROXABAN 20 MG PO TABS: 20.0000 mg | ORAL_TABLET | Freq: Every day | ORAL | 5 refills | Status: DC

## 2018-05-01 NOTE — Patient Instructions (Addendum)
     Medication Instructions:  Your physician recommends that you continue on your current medications as directed. Please refer to the Current Medication list given to you today.   Labwork: Your physician recommends that you return for lab work on 05/20/18 for CBC and Bmet   Testing/Procedures: Your physician has recommended that you wear a holter monitor. Holter monitors are medical devices that record the heart's electrical activity. Doctors most often use these monitors to diagnose arrhythmias. Arrhythmias are problems with the speed or rhythm of the heartbeat. The monitor is a small, portable device. You can wear one while you do your normal daily activities. This is usually used to diagnose what is causing palpitations/syncope (passing out).   Your physician has requested that you have a cardiac catheterization. Cardiac catheterization is used to diagnose and/or treat various heart conditions. Doctors may recommend this procedure for a number of different reasons. The most common reason is to evaluate chest pain. Chest pain can be a symptom of coronary artery disease (CAD), and cardiac catheterization can show whether plaque is narrowing or blocking your heart's arteries. This procedure is also used to evaluate the valves, as well as measure the blood flow and oxygen levels in different parts of your heart. For further information please visit [email or web address]. Please follow instruction sheet, as given.    Follow-Up: Your physician recommends that you schedule a follow-up appointment on the 3rd of 4th week of June with Dr.End or an APP  Any Other Special Instructions Will Be Listed Below (If Applicable). Due to your preference of having your procedure at [facility]. We will call you tomorrow to confirm your procedure date and time   If you need a refill on your cardiac medications before your next appointment, please call your pharmacy.

## 2018-05-01 NOTE — Progress Notes (Signed)
New Outpatient Visit Date: 05/01/2018  Referring Provider: Laverle Hobby, MD [facility]  Chief Complaint: Irregular heart beat  HPI:  Adam Knight is a 77 y.o. male who is being seen today for the evaluation of shortness of breath and chronic anticoagulation in the setting of pulmonary embolism at the request of Dr. Ashby Dawes. He has a history of pulmonary embolism and concern for chronic thromboembolic pulmonary hypertension (CTEPH), obstructive sleep apnea on CPAP, dyslipidemia, prediabetes, and gastric ulcer. Adam Knight presents today for evaluation of an irregular heart beat as well as consideration of switching from rivaroxaban to warfarin.  Adam Knight denies a history of arrhythmias but notices brief palpitations every 2-3 days when sitting still.   When he checks his blood pressure at home with an automatic cuff, it typically indicates an irregular heart beat.  Adam Knight main symptoms have been longstanding shortness of breath for several years.  He initially followed with Dr. Raul Del [facility]) but subsequently established care with Dr. 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  Despite catheter-based thrombectomy and long-term anticoagulation, Adam Knight has not noticed any significant improvement in his shortness of breath.  He notes shortness of breath just walking around his house.  He also has occassional left lower chest pain, that is sharp and lasts for only a second or two.  The pain is not exertional.  Adam Knight was evaluated by Dr. Clayborn Bigness last year and underwent myocardial perfusion stress test that was read as normal.  He has never undergone left or right heart catheterization.  He was [place]iron[place] due to leg edema.  No other diuretic was ever tried out of concern over his remote sulfa allergy.  Edema has been  suboptimally controlled, though spironolactone use led to breast enlargement and intermittent tenderness.  Adam Knight has been on rivaroxaban since his PE diagnosis in January.  He notes easy bleeding when he scratches himself bu no melena, hematochezia, hematuria, or epistaxis.  He is considering switching to warfarin at his wife's request because "there is no antidote" to rivaroxaban.  --------------------------------------------------------------------------------------------------  Cardiovascular History & Procedures: Cardiovascular Problems:  Pulmonary emboli and concern for chronic thromboembolic pulmonary hypertension  Risk Factors:  Prediabetes, male gender, morbid obesity, and age greater than 59  Cath/PCI:  None  CV Surgery:  Pulmonary angiography and right pulmonary catheter-directed thrombolytics and thrombectomy (01/08/2018, Dr. Delana Knight)  EP Procedures and Devices:  None  Non-Invasive Evaluation(s):  TTE (01/06/2018): Normal LV size with mild LVH.  LVEF 55 to 65%.  Mild MR.  Moderately dilated RV with mildly to moderately reduced contraction.  Mild right atrial enlargement.  CTA chest (01/06/2018): Bilateral pulmonary emboli with appearance suggestive of acute on chronic emboli.  RV is dilated.  5 mm right upper lobe pulmonary nodule.  Pharmacologic MPI (04/23/17): Normal study without ischemia or scar.  LVEF 62%.  TTE (04/23/17): LVEF 55% with mild RV enlargement and normal RV function.  Mild TR.  Trivial mitral and aortic regurgitation.  TTE (02/02/15): LVEF 55-60%.  Mildly dilated RV with normal function.  Mild TR.  Trivial AI and MR.  Exercise stress echo (02/04/15): Normal.  Recent CV Pertinent Labs: Lab Results  Component Value Date   INR 1.05 01/06/2018   K 4.4 01/09/2018   BUN 21 (H) 01/09/2018   CREATININE 1.05 01/09/2018    --------------------------------------------------------------------------------------------------  Past Medical History:    Diagnosis Date  .  Dyslipidemia   . Edema   . Gastric ulcer   . OSA on CPAP   . Prediabetes   . Pulmonary embolism ([place])   . Seasonal allergies     Past Surgical History:  Procedure Laterality Date  . PULMONARY VENOGRAPHY Bilateral 01/08/2018   Procedure: PULMONARY VENOGRAPHY possible thrombolysis;  Surgeon: Katha Cabal, MD;  Location: [facility];  Service: Cardiovascular;  Laterality: Bilateral;  . TONSILLECTOMY      Current Meds  Medication Sig  . Calcium Carbonate Antacid (CALCIUM CARBONATE, DOSED IN MG ELEMENTAL CALCIUM,) 1250 MG/5ML SUSP Take by mouth.  . Cholecalciferol (VITAMIN D) 2000 UNITS tablet Take 2,000 Units by mouth daily.  . rivaroxaban (XARELTO) 20 MG TABS tablet Take 1 tablet (20 mg total) by mouth daily.  Marland Kitchen spironolactone (ALDACTONE) 25 MG tablet Take 25 mg by mouth 2 (two) times daily.    Allergies: Sulfa antibiotics and Penicillins  Social History   Tobacco Use  . Smoking status: Former Smoker    Packs/day: 0.30    Years: 5.00    Pack years: 1.50    Types: Cigarettes  . Smokeless tobacco: Never Used  Substance Use Topics  . Alcohol use: No    Alcohol/week: 0.0 oz  . Drug use: No    Family History  Problem Relation Age of Onset  . Stroke Mother   . Coronary artery disease Mother   . Osteoporosis Mother   . Rheum arthritis Mother   . Thyroid disease Mother     Review of Systems: A 12-system review of systems was performed and was negative except as noted in the HPI.  --------------------------------------------------------------------------------------------------  Physical Exam: BP 120/82 (BP Location: Right Arm, Patient Position: Sitting, Cuff Size: Large)   Pulse 82   Ht 5\' 2"  (1.575 m)   Wt 217 lb 12 oz (98.8 kg)   BMI 39.83 kg/m   General:  NAD HEENT: No conjunctival pallor or scleral icterus. Moist mucous membranes. OP clear. Neck: Supple without lymphadenopathy, thyromegaly, JVD, or HJR. No carotid  bruit. Lungs: Normal work of breathing. Clear to auscultation bilaterally without wheezes or crackles. Heart: Regular rate and rhythm without murmurs, rubs, or gallops. Unable to assess PMI due to body habitus. Abd: Bowel sounds present. Soft, NT/ND without hepatosplenomegaly Ext: 1+ pretibial edema bilaterally. Radial, PT, and DP pulses are 2+ bilaterally Skin: Warm and dry without rash. Neuro: CNIII-XII intact. Strength and fine-touch sensation intact in upper and lower extremities bilaterally. Psych: Normal mood and affect.  EKG:  NSR with RBBB, inferior Q waves, poor R-wave progression, and lateral T-wave inversions.  No change since 01/06/18.  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  At least some degree of RV enlargement has been noted dating back to 2016.  I have recommended left  and right heart catheterization to better establish Adam Knight's hemodynamics in order to optimize therapy.  If there is evidence of moderately to severely elevated PA pressures, we will need to consider referral to a pulmonary hypertension specialist to discuss medical therapy and role for pulmonary thromboendarterectomy.  In the meantime, I have recommended continuation of rivaroxaban, as efficacy is  similar to warfarin and does not require monitoring.  Additionally, an antidote is no available should life-threatening bleeding occur.  Chest pain Likely multifactorial, including chronic PE's and suspected PH.  However, EKG is quite abnormal and raises the possibility of concurrent CAD.  We will proceed with left and right heart catheterization in early June.  I have reviewed the risks, indications, and alternatives to cardiac catheterization, possible angioplasty, and stenting with the patient. Risks include but are not limited to bleeding, infection, vascular injury, stroke, myocardial infection, arrhythmia, kidney injury, radiation-related injury in the case of prolonged fluoroscopy use, emergency cardiac surgery, and death. The patient understands the risks of serious complication is 1-2 in 3016 with diagnostic cardiac cath and 1-2% or less with angioplasty/stenting.  Irregular heart beat and palpitations Irregular heart beat frequently indicated on home BP cuff, though an arrhythmia has not been documented in the past nor on today's EKG.  We have agreed to obtain a 48 hour Holter monitor for further assessment.  No medication changes at this time.  Follow-up: Return to clinic in mid to late June.  Nelva Bush, MD 05/01/2018 2:33 PM

## 2018-05-02 ENCOUNTER — Encounter: Payer: Self-pay | Admitting: Internal Medicine

## 2018-05-02 ENCOUNTER — Telehealth: Payer: Self-pay

## 2018-05-02 DIAGNOSIS — R0789 Other chest pain: Secondary | ICD-10-CM | POA: Insufficient documentation

## 2018-05-02 DIAGNOSIS — R002 Palpitations: Secondary | ICD-10-CM | POA: Insufficient documentation

## 2018-05-02 DIAGNOSIS — I2724 Chronic thromboembolic pulmonary hypertension: Secondary | ICD-10-CM | POA: Insufficient documentation

## 2018-05-02 DIAGNOSIS — R079 Chest pain, unspecified: Secondary | ICD-10-CM | POA: Insufficient documentation

## 2018-05-02 NOTE — Telephone Encounter (Signed)
Called patient to update him on his scheduled cardiac cath on 05/30/18 with Dr.End.  The patients preference is to have his procedure at [facility], at his appt on 5/9 I adv him that I would schedule it on 5/10 for a procedure date of 05/30/18 and call him with date, time, and instruction.  LMTCB for patinet to be given instructions and confirm procedure date and time.  Cardiac cath instruction letter printed and left up front for the patient to pick up when he comes in for his pre-procedure labs. (lab orders bmet and cbc in [place])  Patient will need to have a lab appt scheduled 1 week before 05/23/18.  Message sent to pre-cert.

## 2018-05-03 DIAGNOSIS — R002 Palpitations: Secondary | ICD-10-CM | POA: Diagnosis not present

## 2018-05-07 ENCOUNTER — Ambulatory Visit
Admission: RE | Admit: 2018-05-07 | Discharge: 2018-05-07 | Disposition: A | Discharge status: Home / Self Care | Payer: Medicare Other | Source: Ambulatory Visit | Attending: Internal Medicine | Admitting: Internal Medicine

## 2018-05-07 DIAGNOSIS — R002 Palpitations: Secondary | ICD-10-CM | POA: Diagnosis not present

## 2018-05-08 DIAGNOSIS — G3184 Mild cognitive impairment, so stated: Secondary | ICD-10-CM | POA: Insufficient documentation

## 2018-05-08 NOTE — Telephone Encounter (Signed)
Pt calling stating he would like to post pone his up coming Cath  He is not sure if wanting to do this, for if this does not work then we would need to do an open heart surgery  He is not wanting to do this at the moment  [facility]

## 2018-05-08 NOTE — Telephone Encounter (Signed)
I attempted to call the patient back. I left a message on his identified voice mail that I will cancel his cath and notify Dr. Saunders Revel of this. I advised that he call back if he would like to reschedule his cath or a follow up with Dr. Saunders Revel.  I spoke with Santiago Glad in the cath lab to cancel the patient's cath scheduled for 05/30/18.

## 2018-05-22 ENCOUNTER — Ambulatory Visit: Payer: 59 | Admitting: Internal Medicine

## 2018-05-27 ENCOUNTER — Ambulatory Visit: Admit: 2018-05-27 | Payer: Medicare Other | Admitting: Internal Medicine

## 2018-05-27 SURGERY — RIGHT/LEFT HEART CATH AND CORONARY ANGIOGRAPHY
Anesthesia: Moderate Sedation | Laterality: Bilateral

## 2018-05-30 ENCOUNTER — Encounter (HOSPITAL_COMMUNITY): Payer: Self-pay

## 2018-05-30 ENCOUNTER — Ambulatory Visit (HOSPITAL_COMMUNITY): Admit: 2018-05-30 | Payer: Medicare Other | Admitting: Internal Medicine

## 2018-05-30 SURGERY — RIGHT/LEFT HEART CATH AND CORONARY ANGIOGRAPHY
Anesthesia: LOCAL

## 2018-06-03 ENCOUNTER — Ambulatory Visit: Payer: 59 | Admitting: Cardiovascular Disease

## 2018-06-05 DIAGNOSIS — H5461 Unqualified visual loss, right eye, normal vision left eye: Secondary | ICD-10-CM | POA: Insufficient documentation

## 2018-06-10 ENCOUNTER — Other Ambulatory Visit: Payer: Self-pay

## 2018-06-10 ENCOUNTER — Encounter: Payer: Medicare Other | Attending: Internal Medicine

## 2018-06-10 ENCOUNTER — Ambulatory Visit: Payer: Medicare Other

## 2018-06-10 VITALS — Ht 63.2 in | Wt 216.8 lb

## 2018-06-10 DIAGNOSIS — Z79899 Other long term (current) drug therapy: Secondary | ICD-10-CM | POA: Insufficient documentation

## 2018-06-10 DIAGNOSIS — G4733 Obstructive sleep apnea (adult) (pediatric): Secondary | ICD-10-CM | POA: Diagnosis not present

## 2018-06-10 DIAGNOSIS — R0609 Other forms of dyspnea: Secondary | ICD-10-CM | POA: Insufficient documentation

## 2018-06-10 DIAGNOSIS — I739 Peripheral vascular disease, unspecified: Secondary | ICD-10-CM | POA: Diagnosis not present

## 2018-06-10 DIAGNOSIS — Z86711 Personal history of pulmonary embolism: Secondary | ICD-10-CM | POA: Diagnosis not present

## 2018-06-10 DIAGNOSIS — Z7901 Long term (current) use of anticoagulants: Secondary | ICD-10-CM | POA: Diagnosis not present

## 2018-06-10 DIAGNOSIS — Z87891 Personal history of nicotine dependence: Secondary | ICD-10-CM | POA: Insufficient documentation

## 2018-06-10 DIAGNOSIS — I2602 Saddle embolus of pulmonary artery with acute cor pulmonale: Secondary | ICD-10-CM | POA: Insufficient documentation

## 2018-06-10 DIAGNOSIS — E7800 Pure hypercholesterolemia, unspecified: Secondary | ICD-10-CM | POA: Diagnosis not present

## 2018-06-10 NOTE — Progress Notes (Signed)
Pulmonary Individual Treatment Plan  Patient Details  Name: Adam Knight MRN: [id number] Date of Birth: 12-Mar-1941 Referring Provider:     Pulmonary Rehab from 06/10/2018 in [facility] Cardiac and Pulmonary Rehab  Referring Provider  Povsic, Marcello Moores MD      Initial Encounter Date:    Pulmonary Rehab from 06/10/2018 in [facility] Cardiac and Pulmonary Rehab  Date  06/10/18  Referring Provider  Povsic, Marcello Moores MD      Visit Diagnosis: Dyspnea on exertion  Patient's Home Medications on Admission:  Current Outpatient Medications:  .  Calcium Carbonate Antacid (CALCIUM CARBONATE, DOSED IN MG ELEMENTAL CALCIUM,) 1250 MG/5ML SUSP, Take by mouth., Disp: , Rfl:  .  Cholecalciferol (VITAMIN D) 2000 UNITS tablet, Take 2,000 Units by mouth daily., Disp: , Rfl:  .  rivaroxaban (XARELTO) 20 MG TABS tablet, Take 1 tablet (20 mg total) by mouth daily., Disp: 30 tablet, Rfl: 5 .  spironolactone (ALDACTONE) 25 MG tablet, Take 25 mg by mouth 2 (two) times daily., Disp: , Rfl:   Past Medical History: Past Medical History:  Diagnosis Date  . Dyslipidemia   . Edema   . Gastric ulcer   . OSA on CPAP   . Prediabetes   . Pulmonary embolism ([place])   . Seasonal allergies     Tobacco Use: Social History   Tobacco Use  Smoking Status Former Smoker  . Packs/day: 0.30  . Years: 7.00  . Pack years: 2.10  . Types: Cigarettes  . Last attempt to quit: 1967  . Years since quitting: 52.4  Smokeless Tobacco Never Used    Labs: Recent Review Flowsheet Data    There is no flowsheet data to display.       Pulmonary Assessment Scores: Pulmonary Assessment Scores    Row Name 06/10/18 1512         ADL UCSD   ADL Phase  Entry     SOB Score total  43     Rest  0     Walk  5     Stairs  3     Bath  0     Shop  3       CAT Score   CAT Score  9       mMRC Score   mMRC Score  3.5        Pulmonary Function Assessment: Pulmonary Function Assessment - 06/10/18 1514      Breath   Bilateral Breath  Sounds  Clear    Shortness of Breath  Yes;Limiting activity       Exercise Target Goals: Date: 06/10/18  Exercise Program Goal: Individual exercise prescription set using results from initial 6 min walk test and THRR while considering  patient's activity barriers and safety.    Exercise Prescription Goal: Initial exercise prescription builds to 30-45 minutes a day of aerobic activity, 2-3 days per week.  Home exercise guidelines will be given to patient during program as part of exercise prescription that the participant will acknowledge.  Activity Barriers & Risk Stratification: Activity Barriers & Cardiac Risk Stratification - 06/10/18 1604      Activity Barriers & Cardiac Risk Stratification   Activity Barriers  Deconditioning;Arthritis;Muscular Weakness;Shortness of Breath;History of Falls;Balance Concerns bilateral knee arthritis       6 Minute Walk: 6 Minute Walk    Row Name 06/10/18 1601         6 Minute Walk   Phase  Initial     Distance  830 feet  Walk Time  5.32 minutes     # of Rest Breaks  1 test terminated due to desaturation     MPH  1.78     METS  1.92     RPE  15     Perceived Dyspnea   4     VO2 Peak  6.73     Symptoms  Yes (comment)     Comments  SOB     Resting HR  75 bpm     Resting BP  132/64     Resting Oxygen Saturation   91 %     Exercise Oxygen Saturation  during 6 min walk  78 %     Max Ex. HR  163 bpm     Max Ex. BP  146/72     2 Minute Post BP  136/74       Interval HR   1 Minute HR  107     2 Minute HR  136     3 Minute HR  134     4 Minute HR  162     5 Minute HR  163     6 Minute HR  159     2 Minute Post HR  93     Interval Heart Rate?  Yes       Interval Oxygen   Interval Oxygen?  Yes     Baseline Oxygen Saturation %  91 %     1 Minute Oxygen Saturation %  89 %     1 Minute Liters of Oxygen  0 L Room Air     2 Minute Oxygen Saturation %  90 %     2 Minute Liters of Oxygen  0 L     3 Minute Oxygen Saturation %  87 %      3 Minute Liters of Oxygen  0 L     4 Minute Oxygen Saturation %  84 %     4 Minute Liters of Oxygen  0 L     5 Minute Oxygen Saturation %  81 %     5 Minute Liters of Oxygen  0 L     6 Minute Oxygen Saturation %  78 % test terminated at 5:19     6 Minute Liters of Oxygen  0 L     2 Minute Post Oxygen Saturation %  93 %     2 Minute Post Liters of Oxygen  0 L       Oxygen Initial Assessment: Oxygen Initial Assessment - 06/10/18 1515      Home Oxygen   Home Oxygen Device  None    Sleep Oxygen Prescription  CPAP    Liters per minute  0    Home Exercise Oxygen Prescription  None    Home at Rest Exercise Oxygen Prescription  None    Compliance with Home Oxygen Use  Yes      Initial 6 min Walk   Oxygen Used  None      Program Oxygen Prescription   Program Oxygen Prescription  None      Intervention   Short Term Goals  To learn and understand importance of maintaining oxygen saturations>88%;To learn and demonstrate proper pursed lip breathing techniques or other breathing techniques.;To learn and understand importance of monitoring SPO2 with pulse oximeter and demonstrate accurate use of the pulse oximeter.    Long  Term Goals  Compliance with respiratory medication;Exhibits proper breathing techniques, such as pursed  lip breathing or other method taught during program session;Maintenance of O2 saturations>88%;Verbalizes importance of monitoring SPO2 with pulse oximeter and return demonstration       Oxygen Re-Evaluation:   Oxygen Discharge (Final Oxygen Re-Evaluation):   Initial Exercise Prescription: Initial Exercise Prescription - 06/10/18 1600      Date of Initial Exercise RX and Referring Provider   Date  06/10/18    Referring Provider  Povsic, Marcello Moores MD      Treadmill   MPH  1.2    Grade  0    Minutes  15    METs  1.97      Recumbant Bike   Level  1    RPM  35    Watts  5    Minutes  15    METs  1.9      NuStep   Level  1    SPM  80    Minutes  15      METs  1.9      Prescription Details   Frequency (times per week)  3    Duration  Progress to 45 minutes of aerobic exercise without signs/symptoms of physical distress      Intensity   THRR 40-80% of Max Heartrate  103-130    Ratings of Perceived Exertion  11-13    Perceived Dyspnea  0-4      Progression   Progression  Continue to progress workloads to maintain intensity without signs/symptoms of physical distress.      Resistance Training   Training Prescription  Yes    Weight  3 lbs    Reps  10-15       Perform Capillary Blood Glucose checks as needed.  Exercise Prescription Changes: Exercise Prescription Changes    Row Name 06/10/18 1600             Response to Exercise   Blood Pressure (Admit)  132/64       Blood Pressure (Exercise)  146/72       Blood Pressure (Exit)  136/74       Heart Rate (Admit)  75 bpm       Heart Rate (Exercise)  163 bpm       Heart Rate (Exit)  93 bpm       Oxygen Saturation (Admit)  91 %       Oxygen Saturation (Exercise)  78 %       Oxygen Saturation (Exit)  93 %       Rating of Perceived Exertion (Exercise)  15       Perceived Dyspnea (Exercise)  4       Symptoms  SOB       Comments  walk test results          Exercise Comments:   Exercise Goals and Review: Exercise Goals    Row Name 06/10/18 1607             Exercise Goals   Increase Physical Activity  Yes       Intervention  Provide advice, education, support and counseling about physical activity/exercise needs.;Develop an individualized exercise prescription for aerobic and resistive training based on initial evaluation findings, risk stratification, comorbidities and participant's personal goals.       Expected Outcomes  Long Term: Add in home exercise to make exercise part of routine and to increase amount of physical activity.;Short Term: Attend rehab on a regular basis to increase amount of physical activity.;Long Term: Exercising regularly at  least 3-5 days a  week.       Increase Strength and Stamina  Yes       Intervention  Provide advice, education, support and counseling about physical activity/exercise needs.;Develop an individualized exercise prescription for aerobic and resistive training based on initial evaluation findings, risk stratification, comorbidities and participant's personal goals.       Expected Outcomes  Short Term: Increase workloads from initial exercise prescription for resistance, speed, and METs.;Short Term: Perform resistance training exercises routinely during rehab and add in resistance training at home;Long Term: Improve cardiorespiratory fitness, muscular endurance and strength as measured by increased METs and functional capacity (6MWT)       Able to understand and use rate of perceived exertion (RPE) scale  Yes       Intervention  Provide education and explanation on how to use RPE scale       Expected Outcomes  Short Term: Able to use RPE daily in rehab to express subjective intensity level;Long Term:  Able to use RPE to guide intensity level when exercising independently       Able to understand and use Dyspnea scale  Yes       Intervention  Provide education and explanation on how to use Dyspnea scale       Expected Outcomes  Long Term: Able to use Dyspnea scale to guide intensity level when exercising independently;Short Term: Able to use Dyspnea scale daily in rehab to express subjective sense of shortness of breath during exertion       Knowledge and understanding of Target Heart Rate Range (THRR)  Yes       Intervention  Provide education and explanation of THRR including how the numbers were predicted and where they are located for reference       Expected Outcomes  Short Term: Able to state/look up THRR;Short Term: Able to use daily as guideline for intensity in rehab;Long Term: Able to use THRR to govern intensity when exercising independently       Able to check pulse independently  Yes       Intervention  Provide  education and demonstration on how to check pulse in carotid and radial arteries.;Review the importance of being able to check your own pulse for safety during independent exercise       Expected Outcomes  Short Term: Able to explain why pulse checking is important during independent exercise;Long Term: Able to check pulse independently and accurately       Understanding of Exercise Prescription  Yes       Intervention  Provide education, explanation, and written materials on patient's individual exercise prescription       Expected Outcomes  Short Term: Able to explain program exercise prescription;Long Term: Able to explain home exercise prescription to exercise independently          Exercise Goals Re-Evaluation :   Discharge Exercise Prescription (Final Exercise Prescription Changes): Exercise Prescription Changes - 06/10/18 1600      Response to Exercise   Blood Pressure (Admit)  132/64    Blood Pressure (Exercise)  146/72    Blood Pressure (Exit)  136/74    Heart Rate (Admit)  75 bpm    Heart Rate (Exercise)  163 bpm    Heart Rate (Exit)  93 bpm    Oxygen Saturation (Admit)  91 %    Oxygen Saturation (Exercise)  78 %    Oxygen Saturation (Exit)  93 %    Rating of Perceived Exertion (  Exercise)  15    Perceived Dyspnea (Exercise)  4    Symptoms  SOB    Comments  walk test results       Nutrition:  Target Goals: Understanding of nutrition guidelines, daily intake of sodium <1511m, cholesterol <2024m calories 30% from fat and 7% or less from saturated fats, daily to have 5 or more servings of fruits and vegetables.  Biometrics: Pre Biometrics - 06/10/18 1608      Pre Biometrics   Height  5' 3.2" (1.605 m)    Weight  216 lb 12.8 oz (98.3 kg)    Waist Circumference  47.5 inches    Hip Circumference  41.5 inches    Waist to Hip Ratio  1.14 %    BMI (Calculated)  38.18    Single Leg Stand  1.37 seconds        Nutrition Therapy Plan and Nutrition Goals: Nutrition  Therapy & Goals - 06/10/18 1511      Personal Nutrition Goals   Comments  He wants to work weight loss. He wants to make sure he is getting enough nutrients with what he eats.      Intervention Plan   Intervention  Prescribe, educate and counsel regarding individualized specific dietary modifications aiming towards targeted core components such as weight, hypertension, lipid management, diabetes, heart failure and other comorbidities.;Nutrition handout(s) given to patient.    Expected Outcomes  Short Term Goal: Understand basic principles of dietary content, such as calories, fat, sodium, cholesterol and nutrients.;Long Term Goal: Adherence to prescribed nutrition plan.       Nutrition Assessments: Nutrition Assessments - 06/10/18 1511      MEDFICTS Scores   Pre Score  21       Nutrition Goals Re-Evaluation:   Nutrition Goals Discharge (Final Nutrition Goals Re-Evaluation):   Psychosocial: Target Goals: Acknowledge presence or absence of significant depression and/or stress, maximize coping skills, provide positive support system. Participant is able to verbalize types and ability to use techniques and skills needed for reducing stress and depression.   Initial Review & Psychosocial Screening: Initial Psych Review & Screening - 06/10/18 1508      Initial Review   Current issues with  Current Stress Concerns    Source of Stress Concerns  Poor Coping Skills;Unable to perform yard/household activities;Chronic Illness    Comments  He cannot do the yardwork due to his breathing.       Family Dynamics   Good Support System?  Yes    Comments  He has three sons that he can talk to and one close by that gives him support      Barriers   Psychosocial barriers to participate in program  The patient should benefit from training in stress management and relaxation.      Screening Interventions   Interventions  Encouraged to exercise;Program counselor consult;Provide feedback about the  scores to participant;To provide support and resources with identified psychosocial needs    Expected Outcomes  Long Term Goal: Stressors or current issues are controlled or eliminated.;Short Term goal: Utilizing psychosocial counselor, staff and physician to assist with identification of specific Stressors or current issues interfering with healing process. Setting desired goal for each stressor or current issue identified.;Short Term goal: Identification and review with participant of any Quality of Life or Depression concerns found by scoring the questionnaire.;Long Term goal: The participant improves quality of Life and PHQ9 Scores as seen by post scores and/or verbalization of changes  Quality of Life Scores:  Scores of 19 and below usually indicate a poorer quality of life in these areas.  A difference of  2-3 points is a clinically meaningful difference.  A difference of 2-3 points in the total score of the Quality of Life Index has been associated with significant improvement in overall quality of life, self-image, physical symptoms, and general health in studies assessing change in quality of life.  PHQ-9: Recent Review Flowsheet Data    Depression screen [facility] 2/9 06/10/2018   Decreased Interest 0   Down, Depressed, Hopeless 0   PHQ - 2 Score 0   Altered sleeping 2   Tired, decreased energy 2   Change in appetite 0   Feeling bad or failure about yourself  0   Trouble concentrating 0   Moving slowly or fidgety/restless 0   Suicidal thoughts 2    PHQ-9 Score 6   Difficult doing work/chores Not difficult at all     Interpretation of Total Score  Total Score Depression Severity:  1-4 = Minimal depression, 5-9 = Mild depression, 10-14 = Moderate depression, 15-19 = Moderately severe depression, 20-27 = Severe depression   Psychosocial Evaluation and Intervention:   Psychosocial Re-Evaluation:   Psychosocial Discharge (Final Psychosocial  Re-Evaluation):   Education: Education Goals: Education classes will be provided on a weekly basis, covering required topics. Participant will state understanding/return demonstration of topics presented.  Learning Barriers/Preferences: Learning Barriers/Preferences - 06/10/18 1514      Learning Barriers/Preferences   Learning Barriers  Sight wears glasses    Learning Preferences  None       Education Topics:  Initial Evaluation Education: - Verbal, written and demonstration of respiratory meds, oximetry and breathing techniques. Instruction on use of nebulizers and MDIs and importance of monitoring MDI activations.   Pulmonary Rehab from 06/10/2018 in [facility] Cardiac and Pulmonary Rehab  Date  06/10/18  Educator  [facility]   Instruction Review Code  1- Verbalizes Understanding      General Nutrition Guidelines/Fats and Fiber: -Group instruction provided by verbal, written material, models and posters to present the general guidelines for heart healthy nutrition. Gives an explanation and review of dietary fats and fiber.   Controlling Sodium/Reading Food Labels: -Group verbal and written material supporting the discussion of sodium use in heart healthy nutrition. Review and explanation with models, verbal and written materials for utilization of the food label.   Exercise Physiology & General Exercise Guidelines: - Group verbal and written instruction with models to review the exercise physiology of the cardiovascular system and associated critical values. Provides general exercise guidelines with specific guidelines to those with heart or lung disease.    Aerobic Exercise & Resistance Training: - Gives group verbal and written instruction on the various components of exercise. Focuses on aerobic and resistive training programs and the benefits of this training and how to safely progress through these programs.   Flexibility, Balance, Mind/Body Relaxation: Provides group verbal/written  instruction on the benefits of flexibility and balance training, including mind/body exercise modes such as yoga, pilates and tai chi.  Demonstration and skill practice provided.   Stress and Anxiety: - Provides group verbal and written instruction about the health risks of elevated stress and causes of high stress.  Discuss the correlation between heart/lung disease and anxiety and treatment options. Review healthy ways to manage with stress and anxiety.   Depression: - Provides group verbal and written instruction on the correlation between heart/lung disease and depressed mood, treatment options, and  the stigmas associated with seeking treatment.   Exercise & Equipment Safety: - Individual verbal instruction and demonstration of equipment use and safety with use of the equipment.   Pulmonary Rehab from 06/10/2018 in [facility] Cardiac and Pulmonary Rehab  Date  06/10/18  Educator  [facility]  Instruction Review Code  1- Verbalizes Understanding      Infection Prevention: - Provides verbal and written material to individual with discussion of infection control including proper hand washing and proper equipment cleaning during exercise session.   Pulmonary Rehab from 06/10/2018 in [facility] Cardiac and Pulmonary Rehab  Date  06/10/18  Educator  [facility]  Instruction Review Code  1- Verbalizes Understanding      Falls Prevention: - Provides verbal and written material to individual with discussion of falls prevention and safety.   Pulmonary Rehab from 06/10/2018 in [facility] Cardiac and Pulmonary Rehab  Date  06/10/18  Educator  [facility]  Instruction Review Code  1- Verbalizes Understanding      Diabetes: - Individual verbal and written instruction to review signs/symptoms of diabetes, desired ranges of glucose level fasting, after meals and with exercise. Advice that pre and post exercise glucose checks will be done for 3 sessions at entry of program.   Chronic Lung Diseases: - Group verbal and written  instruction to review updates, respiratory medications, advancements in procedures and treatments. Discuss use of supplemental oxygen including available portable oxygen systems, continuous and intermittent flow rates, concentrators, personal use and safety guidelines. Review proper use of inhaler and spacers. Provide informative websites for self-education.    Energy Conservation: - Provide group verbal and written instruction for methods to conserve energy, plan and organize activities. Instruct on pacing techniques, use of adaptive equipment and posture/positioning to relieve shortness of breath.   Triggers and Exacerbations: - Group verbal and written instruction to review types of environmental triggers and ways to prevent exacerbations. Discuss weather changes, air quality and the benefits of nasal washing. Review warning signs and symptoms to help prevent infections. Discuss techniques for effective airway clearance, coughing, and vibrations.   AED/CPR: - Group verbal and written instruction with the use of models to demonstrate the basic use of the AED with the basic ABC's of resuscitation.   Anatomy and Physiology of the Lungs: - Group verbal and written instruction with the use of models to provide basic lung anatomy and physiology related to function, structure and complications of lung disease.   Anatomy & Physiology of the Heart: - Group verbal and written instruction and models provide basic cardiac anatomy and physiology, with the coronary electrical and arterial systems. Review of Valvular disease and Heart Failure   Cardiac Medications: - Group verbal and written instruction to review commonly prescribed medications for heart disease. Reviews the medication, class of the drug, and side effects.   Know Your Numbers and Risk Factors: -Group verbal and written instruction about important numbers in your health.  Discussion of what are risk factors and how they play a role in  the disease process.  Review of Cholesterol, Blood Pressure, Diabetes, and BMI and the role they play in your overall health.   Sleep Hygiene: -Provides group verbal and written instruction about how sleep can affect your health.  Define sleep hygiene, discuss sleep cycles and impact of sleep habits. Review good sleep hygiene tips.    Other: -Provides group and verbal instruction on various topics (see comments)    Knowledge Questionnaire Score: Knowledge Questionnaire Score - 06/10/18 1514  Knowledge Questionnaire Score   Pre Score  12/18 reviewed with patient        Core Components/Risk Factors/Patient Goals at Admission: Personal Goals and Risk Factors at Admission - 06/10/18 1516      Core Components/Risk Factors/Patient Goals on Admission    Weight Management  Yes;Weight Loss;Weight Maintenance;Obesity    Intervention  Weight Management: Provide education and appropriate resources to help participant work on and attain dietary goals.;Weight Management: Develop a combined nutrition and exercise program designed to reach desired caloric intake, while maintaining appropriate intake of nutrient and fiber, sodium and fats, and appropriate energy expenditure required for the weight goal.;Weight Management/Obesity: Establish reasonable short term and long term weight goals.;Obesity: Provide education and appropriate resources to help participant work on and attain dietary goals.    Admit Weight  216 lb 12.8 oz (98.3 kg)    Goal Weight: Short Term  211 lb (95.7 kg)    Goal Weight: Long Term  145 lb (65.8 kg)    Expected Outcomes  Short Term: Continue to assess and modify interventions until short term weight is achieved;Long Term: Adherence to nutrition and physical activity/exercise program aimed toward attainment of established weight goal;Weight Maintenance: Understanding of the daily nutrition guidelines, which includes 25-35% calories from fat, 7% or less cal from saturated fats,  less than 268m cholesterol, less than 1.5gm of sodium, & 5 or more servings of fruits and vegetables daily;Weight Loss: Understanding of general recommendations for a balanced deficit meal plan, which promotes 1-2 lb weight loss per week and includes a negative energy balance of [phone] kcal/d;Understanding recommendations for meals to include 15-35% energy as protein, 25-35% energy from fat, 35-60% energy from carbohydrates, less than 2020mof dietary cholesterol, 20-35 gm of total fiber daily;Understanding of distribution of calorie intake throughout the day with the consumption of 4-5 meals/snacks    Improve shortness of breath with ADL's  Yes    Intervention  Provide education, individualized exercise plan and daily activity instruction to help decrease symptoms of SOB with activities of daily living.    Expected Outcomes  Short Term: Improve cardiorespiratory fitness to achieve a reduction of symptoms when performing ADLs;Long Term: Be able to perform more ADLs without symptoms or delay the onset of symptoms    Heart Failure  Yes    Intervention  Provide a combined exercise and nutrition program that is supplemented with education, support and counseling about heart failure. Directed toward relieving symptoms such as shortness of breath, decreased exercise tolerance, and extremity edema.    Expected Outcomes  Improve functional capacity of life;Short term: Daily weights obtained and reported for increase. Utilizing diuretic protocols set by physician.;Short term: Attendance in program 2-3 days a week with increased exercise capacity. Reported lower sodium intake. Reported increased fruit and vegetable intake. Reports medication compliance.;Long term: Adoption of self-care skills and reduction of barriers for early signs and symptoms recognition and intervention leading to self-care maintenance.    Lipids  Yes    Intervention  Provide education and support for participant on nutrition &  aerobic/resistive exercise along with prescribed medications to achieve LDL <7086mHDL >75m63m  Expected Outcomes  Short Term: Participant states understanding of desired cholesterol values and is compliant with medications prescribed. Participant is following exercise prescription and nutrition guidelines.;Long Term: Cholesterol controlled with medications as prescribed, with individualized exercise RX and with personalized nutrition plan. Value goals: LDL < 70mg78mL > 40 mg.       Core Components/Risk Factors/Patient Goals  Review:    Core Components/Risk Factors/Patient Goals at Discharge (Final Review):    ITP Comments: ITP Comments    Row Name 06/10/18 1408           ITP Comments  Medical Evaluation completed. Chart sent for review and changes to Dr. Caryl Comes for Dr. Emily Filbert Director of [place]. Diagnosis can be found in [facility] encounter 05/30/18          Comments: Initial ITP

## 2018-06-10 NOTE — Patient Instructions (Signed)
Patient Instructions  Patient Details  Name: Adam Knight MRN: [id number] Date of Birth: [birth year] Referring Provider:  Lovena Neighbours, MD  Below are your personal goals for exercise, nutrition, and risk factors. Our goal is to help you stay on track towards obtaining and maintaining these goals. We will be discussing your progress on these goals with you throughout the program.  Initial Exercise Prescription: Initial Exercise Prescription - 06/10/18 1600      Date of Initial Exercise RX and Referring Provider   Date  06/10/18    Referring Provider  Povsic, Marcello Moores MD      Treadmill   MPH  1.2    Grade  0    Minutes  15    METs  1.97      Recumbant Bike   Level  1    RPM  35    Watts  5    Minutes  15    METs  1.9      NuStep   Level  1    SPM  80    Minutes  15    METs  1.9      Prescription Details   Frequency (times per week)  3    Duration  Progress to 45 minutes of aerobic exercise without signs/symptoms of physical distress      Intensity   THRR 40-80% of Max Heartrate  103-130    Ratings of Perceived Exertion  11-13    Perceived Dyspnea  0-4      Progression   Progression  Continue to progress workloads to maintain intensity without signs/symptoms of physical distress.      Resistance Training   Training Prescription  Yes    Weight  3 lbs    Reps  10-15       Exercise Goals: Frequency: Be able to perform aerobic exercise two to three times per week in program working toward 2-5 days per week of home exercise.  Intensity: Work with a perceived exertion of 11 (fairly light) - 15 (hard) while following your exercise prescription.  We will make changes to your prescription with you as you progress through the program.   Duration: Be able to do 30 to 45 minutes of continuous aerobic exercise in addition to a 5 minute warm-up and a 5 minute cool-down routine.   Nutrition Goals: Your personal nutrition goals will be established when you do your  nutrition analysis with the dietician.  The following are general nutrition guidelines to follow: Cholesterol < 200mg /day Sodium < 1500mg /day Fiber: Men over 50 yrs - 30 grams per day  Personal Goals: Personal Goals and Risk Factors at Admission - 06/10/18 1516      Core Components/Risk Factors/Patient Goals on Admission    Weight Management  Yes;Weight Loss;Weight Maintenance;Obesity    Intervention  Weight Management: Provide education and appropriate resources to help participant work on and attain dietary goals.;Weight Management: Develop a combined nutrition and exercise program designed to reach desired caloric intake, while maintaining appropriate intake of nutrient and fiber, sodium and fats, and appropriate energy expenditure required for the weight goal.;Weight Management/Obesity: Establish reasonable short term and long term weight goals.;Obesity: Provide education and appropriate resources to help participant work on and attain dietary goals.    Admit Weight  216 lb 12.8 oz (98.3 kg)    Goal Weight: Short Term  211 lb (95.7 kg)    Goal Weight: Long Term  145 lb (65.8 kg)    Expected Outcomes  Short  Term: Continue to assess and modify interventions until short term weight is achieved;Long Term: Adherence to nutrition and physical activity/exercise program aimed toward attainment of established weight goal;Weight Maintenance: Understanding of the daily nutrition guidelines, which includes 25-35% calories from fat, 7% or less cal from saturated fats, less than 200mg  cholesterol, less than 1.5gm of sodium, & 5 or more servings of fruits and vegetables daily;Weight Loss: Understanding of general recommendations for a balanced deficit meal plan, which promotes 1-2 lb weight loss per week and includes a negative energy balance of [phone] kcal/d;Understanding recommendations for meals to include 15-35% energy as protein, 25-35% energy from fat, 35-60% energy from carbohydrates, less than 200mg   of dietary cholesterol, 20-35 gm of total fiber daily;Understanding of distribution of calorie intake throughout the day with the consumption of 4-5 meals/snacks    Improve shortness of breath with ADL's  Yes    Intervention  Provide education, individualized exercise plan and daily activity instruction to help decrease symptoms of SOB with activities of daily living.    Expected Outcomes  Short Term: Improve cardiorespiratory fitness to achieve a reduction of symptoms when performing ADLs;Long Term: Be able to perform more ADLs without symptoms or delay the onset of symptoms    Heart Failure  Yes    Intervention  Provide a combined exercise and nutrition program that is supplemented with education, support and counseling about heart failure. Directed toward relieving symptoms such as shortness of breath, decreased exercise tolerance, and extremity edema.    Expected Outcomes  Improve functional capacity of life;Short term: Daily weights obtained and reported for increase. Utilizing diuretic protocols set by physician.;Short term: Attendance in program 2-3 days a week with increased exercise capacity. Reported lower sodium intake. Reported increased fruit and vegetable intake. Reports medication compliance.;Long term: Adoption of self-care skills and reduction of barriers for early signs and symptoms recognition and intervention leading to self-care maintenance.    Lipids  Yes    Intervention  Provide education and support for participant on nutrition & aerobic/resistive exercise along with prescribed medications to achieve LDL 70mg , HDL >40mg .    Expected Outcomes  Short Term: Participant states understanding of desired cholesterol values and is compliant with medications prescribed. Participant is following exercise prescription and nutrition guidelines.;Long Term: Cholesterol controlled with medications as prescribed, with individualized exercise RX and with personalized nutrition plan. Value goals: LDL  < 70mg , HDL > 40 mg.       Tobacco Use Initial Evaluation: Social History   Tobacco Use  Smoking Status Former Smoker  . Packs/day: 0.30  . Years: 7.00  . Pack years: 2.10  . Types: Cigarettes  . Last attempt to quit: 1967  . Years since quitting: 52.4  Smokeless Tobacco Never Used    Exercise Goals and Review: Exercise Goals    Row Name 06/10/18 1607             Exercise Goals   Increase Physical Activity  Yes       Intervention  Provide advice, education, support and counseling about physical activity/exercise needs.;Develop an individualized exercise prescription for aerobic and resistive training based on initial evaluation findings, risk stratification, comorbidities and participant's personal goals.       Expected Outcomes  Long Term: Add in home exercise to make exercise part of routine and to increase amount of physical activity.;Short Term: Attend rehab on a regular basis to increase amount of physical activity.;Long Term: Exercising regularly at least 3-5 days a week.  Increase Strength and Stamina  Yes       Intervention  Provide advice, education, support and counseling about physical activity/exercise needs.;Develop an individualized exercise prescription for aerobic and resistive training based on initial evaluation findings, risk stratification, comorbidities and participant's personal goals.       Expected Outcomes  Short Term: Increase workloads from initial exercise prescription for resistance, speed, and METs.;Short Term: Perform resistance training exercises routinely during rehab and add in resistance training at home;Long Term: Improve cardiorespiratory fitness, muscular endurance and strength as measured by increased METs and functional capacity (6MWT)       Able to understand and use rate of perceived exertion (RPE) scale  Yes       Intervention  Provide education and explanation on how to use RPE scale       Expected Outcomes  Short Term: Able to use  RPE daily in rehab to express subjective intensity level;Long Term:  Able to use RPE to guide intensity level when exercising independently       Able to understand and use Dyspnea scale  Yes       Intervention  Provide education and explanation on how to use Dyspnea scale       Expected Outcomes  Long Term: Able to use Dyspnea scale to guide intensity level when exercising independently;Short Term: Able to use Dyspnea scale daily in rehab to express subjective sense of shortness of breath during exertion       Knowledge and understanding of Target Heart Rate Range (THRR)  Yes       Intervention  Provide education and explanation of THRR including how the numbers were predicted and where they are located for reference       Expected Outcomes  Short Term: Able to state/look up THRR;Short Term: Able to use daily as guideline for intensity in rehab;Long Term: Able to use THRR to govern intensity when exercising independently       Able to check pulse independently  Yes       Intervention  Provide education and demonstration on how to check pulse in carotid and radial arteries.;Review the importance of being able to check your own pulse for safety during independent exercise       Expected Outcomes  Short Term: Able to explain why pulse checking is important during independent exercise;Long Term: Able to check pulse independently and accurately       Understanding of Exercise Prescription  Yes       Intervention  Provide education, explanation, and written materials on patient's individual exercise prescription       Expected Outcomes  Short Term: Able to explain program exercise prescription;Long Term: Able to explain home exercise prescription to exercise independently          Copy of goals given to participant.

## 2018-06-16 ENCOUNTER — Telehealth: Payer: Self-pay

## 2018-06-16 DIAGNOSIS — R0609 Other forms of dyspnea: Principal | ICD-10-CM

## 2018-06-16 NOTE — Progress Notes (Signed)
Discharge Progress Report  Patient Details  Name: Adam Knight MRN: [id number] Date of Birth: 09/24/1941 Referring Provider:     Pulmonary Rehab from 06/10/2018 in [facility]  Referring Provider  Povsic, Marcello Moores MD       Number of Visits: 1/36  Reason for Discharge:  Early Exit:  Personal  Smoking History:  Social History   Tobacco Use  Smoking Status Former Smoker  . Packs/day: 0.30  . Years: 7.00  . Pack years: 2.10  . Types: Cigarettes  . Last attempt to quit: 1967  . Years since quitting: 52.5  Smokeless Tobacco Never Used    Diagnosis:  Dyspnea on exertion  ADL UCSD: Pulmonary Assessment Scores    Row Name 06/10/18 1512         ADL UCSD   ADL Phase  Entry     SOB Score total  43     Rest  0     Walk  5     Stairs  3     Bath  0     Shop  3       CAT Score   CAT Score  9       mMRC Score   mMRC Score  3.5        Initial Exercise Prescription: Initial Exercise Prescription - 06/10/18 1600      Date of Initial Exercise RX and Referring Provider   Date  06/10/18    Referring Provider  Povsic, Marcello Moores MD      Treadmill   MPH  1.2    Grade  0    Minutes  15    METs  1.97      Recumbant Bike   Level  1    RPM  35    Watts  5    Minutes  15    METs  1.9      NuStep   Level  1    SPM  80    Minutes  15    METs  1.9      Prescription Details   Frequency (times per week)  3    Duration  Progress to 45 minutes of aerobic exercise without signs/symptoms of physical distress      Intensity   THRR 40-80% of Max Heartrate  103-130    Ratings of Perceived Exertion  11-13    Perceived Dyspnea  0-4      Progression   Progression  Continue to progress workloads to maintain intensity without signs/symptoms of physical distress.      Resistance Training   Training Prescription  Yes    Weight  3 lbs    Reps  10-15       Discharge Exercise Prescription (Final Exercise Prescription Changes): Exercise Prescription  Changes - 06/10/18 1600      Response to Exercise   Blood Pressure (Admit)  132/64    Blood Pressure (Exercise)  146/72    Blood Pressure (Exit)  136/74    Heart Rate (Admit)  75 bpm    Heart Rate (Exercise)  163 bpm    Heart Rate (Exit)  93 bpm    Oxygen Saturation (Admit)  91 %    Oxygen Saturation (Exercise)  78 %    Oxygen Saturation (Exit)  93 %    Rating of Perceived Exertion (Exercise)  15    Perceived Dyspnea (Exercise)  4    Symptoms  SOB    Comments  walk  test results       Functional Capacity: 6 Minute Walk    Row Name 06/10/18 1601         6 Minute Walk   Phase  Initial     Distance  830 feet     Walk Time  5.32 minutes     # of Rest Breaks  1 test terminated due to desaturation     MPH  1.78     METS  1.92     RPE  15     Perceived Dyspnea   4     VO2 Peak  6.73     Symptoms  Yes (comment)     Comments  SOB     Resting HR  75 bpm     Resting BP  132/64     Resting Oxygen Saturation   91 %     Exercise Oxygen Saturation  during 6 min walk  78 %     Max Ex. HR  163 bpm     Max Ex. BP  146/72     2 Minute Post BP  136/74       Interval HR   1 Minute HR  107     2 Minute HR  136     3 Minute HR  134     4 Minute HR  162     5 Minute HR  163     6 Minute HR  159     2 Minute Post HR  93     Interval Heart Rate?  Yes       Interval Oxygen   Interval Oxygen?  Yes     Baseline Oxygen Saturation %  91 %     1 Minute Oxygen Saturation %  89 %     1 Minute Liters of Oxygen  0 L Room Air     2 Minute Oxygen Saturation %  90 %     2 Minute Liters of Oxygen  0 L     3 Minute Oxygen Saturation %  87 %     3 Minute Liters of Oxygen  0 L     4 Minute Oxygen Saturation %  84 %     4 Minute Liters of Oxygen  0 L     5 Minute Oxygen Saturation %  81 %     5 Minute Liters of Oxygen  0 L     6 Minute Oxygen Saturation %  78 % test terminated at 5:19     6 Minute Liters of Oxygen  0 L     2 Minute Post Oxygen Saturation %  93 %     2 Minute Post Liters of  Oxygen  0 L        Psychological, QOL, Others - Outcomes: PHQ 2/9: Depression screen PHQ 2/9 06/10/2018  Decreased Interest 0  Down, Depressed, Hopeless 0  PHQ - 2 Score 0  Altered sleeping 2  Tired, decreased energy 2  Change in appetite 0  Feeling bad or failure about yourself  0  Trouble concentrating 0  Moving slowly or fidgety/restless 0  Suicidal thoughts 2  PHQ-9 Score 6  Difficult doing work/chores Not difficult at all    Quality of Life:   Personal Goals: Goals established at orientation with interventions provided to work toward goal. Personal Goals and Risk Factors at Admission - 06/10/18 1516      Core Components/Risk Factors/Patient Goals on Admission  Weight Management  Yes;Weight Loss;Weight Maintenance;Obesity    Intervention  Weight Management: Provide education and appropriate resources to help participant work on and attain dietary goals.;Weight Management: Develop a combined nutrition and exercise program designed to reach desired caloric intake, while maintaining appropriate intake of nutrient and fiber, sodium and fats, and appropriate energy expenditure required for the weight goal.;Weight Management/Obesity: Establish reasonable short term and long term weight goals.;Obesity: Provide education and appropriate resources to help participant work on and attain dietary goals.    Admit Weight  216 lb 12.8 oz (98.3 kg)    Goal Weight: Short Term  211 lb (95.7 kg)    Goal Weight: Long Term  145 lb (65.8 kg)    Expected Outcomes  Short Term: Continue to assess and modify interventions until short term weight is achieved;Long Term: Adherence to nutrition and physical activity/exercise program aimed toward attainment of established weight goal;Weight Maintenance: Understanding of the daily nutrition guidelines, which includes 25-35% calories from fat, 7% or less cal from saturated fats, less than 200mg  cholesterol, less than 1.5gm of sodium, & 5 or more servings of  fruits and vegetables daily;Weight Loss: Understanding of general recommendations for a balanced deficit meal plan, which promotes 1-2 lb weight loss per week and includes a negative energy balance of [phone] kcal/d;Understanding recommendations for meals to include 15-35% energy as protein, 25-35% energy from fat, 35-60% energy from carbohydrates, less than 200mg  of dietary cholesterol, 20-35 gm of total fiber daily;Understanding of distribution of calorie intake throughout the day with the consumption of 4-5 meals/snacks    Improve shortness of breath with ADL's  Yes    Intervention  Provide education, individualized exercise plan and daily activity instruction to help decrease symptoms of SOB with activities of daily living.    Expected Outcomes  Short Term: Improve cardiorespiratory fitness to achieve a reduction of symptoms when performing ADLs;Long Term: Be able to perform more ADLs without symptoms or delay the onset of symptoms    Heart Failure  Yes    Intervention  Provide a combined exercise and nutrition program that is supplemented with education, support and counseling about heart failure. Directed toward relieving symptoms such as shortness of breath, decreased exercise tolerance, and extremity edema.    Expected Outcomes  Improve functional capacity of life;Short term: Daily weights obtained and reported for increase. Utilizing diuretic protocols set by physician.;Short term: Attendance in program 2-3 days a week with increased exercise capacity. Reported lower sodium intake. Reported increased fruit and vegetable intake. Reports medication compliance.;Long term: Adoption of self-care skills and reduction of barriers for early signs and symptoms recognition and intervention leading to self-care maintenance.    Lipids  Yes    Intervention  Provide education and support for participant on nutrition & aerobic/resistive exercise along with prescribed medications to achieve LDL 70mg , HDL >40mg .     Expected Outcomes  Short Term: Participant states understanding of desired cholesterol values and is compliant with medications prescribed. Participant is following exercise prescription and nutrition guidelines.;Long Term: Cholesterol controlled with medications as prescribed, with individualized exercise RX and with personalized nutrition plan. Value goals: LDL < 70mg , HDL > 40 mg.        Personal Goals Discharge:   Exercise Goals and Review: Exercise Goals    Row Name 06/10/18 1607             Exercise Goals   Increase Physical Activity  Yes       Intervention  Provide advice, education, support and  counseling about physical activity/exercise needs.;Develop an individualized exercise prescription for aerobic and resistive training based on initial evaluation findings, risk stratification, comorbidities and participant's personal goals.       Expected Outcomes  Long Term: Add in home exercise to make exercise part of routine and to increase amount of physical activity.;Short Term: Attend rehab on a regular basis to increase amount of physical activity.;Long Term: Exercising regularly at least 3-5 days a week.       Increase Strength and Stamina  Yes       Intervention  Provide advice, education, support and counseling about physical activity/exercise needs.;Develop an individualized exercise prescription for aerobic and resistive training based on initial evaluation findings, risk stratification, comorbidities and participant's personal goals.       Expected Outcomes  Short Term: Increase workloads from initial exercise prescription for resistance, speed, and METs.;Short Term: Perform resistance training exercises routinely during rehab and add in resistance training at home;Long Term: Improve cardiorespiratory fitness, muscular endurance and strength as measured by increased METs and functional capacity (6MWT)       Able to understand and use rate of perceived exertion (RPE) scale  Yes        Intervention  Provide education and explanation on how to use RPE scale       Expected Outcomes  Short Term: Able to use RPE daily in rehab to express subjective intensity level;Long Term:  Able to use RPE to guide intensity level when exercising independently       Able to understand and use Dyspnea scale  Yes       Intervention  Provide education and explanation on how to use Dyspnea scale       Expected Outcomes  Long Term: Able to use Dyspnea scale to guide intensity level when exercising independently;Short Term: Able to use Dyspnea scale daily in rehab to express subjective sense of shortness of breath during exertion       Knowledge and understanding of Target Heart Rate Range (THRR)  Yes       Intervention  Provide education and explanation of THRR including how the numbers were predicted and where they are located for reference       Expected Outcomes  Short Term: Able to state/look up THRR;Short Term: Able to use daily as guideline for intensity in rehab;Long Term: Able to use THRR to govern intensity when exercising independently       Able to check pulse independently  Yes       Intervention  Provide education and demonstration on how to check pulse in carotid and radial arteries.;Review the importance of being able to check your own pulse for safety during independent exercise       Expected Outcomes  Short Term: Able to explain why pulse checking is important during independent exercise;Long Term: Able to check pulse independently and accurately       Understanding of Exercise Prescription  Yes       Intervention  Provide education, explanation, and written materials on patient's individual exercise prescription       Expected Outcomes  Short Term: Able to explain program exercise prescription;Long Term: Able to explain home exercise prescription to exercise independently          Nutrition & Weight - Outcomes: Pre Biometrics - 06/10/18 1608      Pre Biometrics   Height  5'  3.2" (1.605 m)    Weight  216 lb 12.8 oz (98.3 kg)    Waist  Circumference  47.5 inches    Hip Circumference  41.5 inches    Waist to Hip Ratio  1.14 %    BMI (Calculated)  38.18    Single Leg Stand  1.37 seconds        Nutrition: Nutrition Therapy & Goals - 06/10/18 1511      Personal Nutrition Goals   Comments  He wants to work weight loss. He wants to make sure he is getting enough nutrients with what he eats.      Intervention Plan   Intervention  Prescribe, educate and counsel regarding individualized specific dietary modifications aiming towards targeted core components such as weight, hypertension, lipid management, diabetes, heart failure and other comorbidities.;Nutrition handout(s) given to patient.    Expected Outcomes  Short Term Goal: Understand basic principles of dietary content, such as calories, fat, sodium, cholesterol and nutrients.;Long Term Goal: Adherence to prescribed nutrition plan.       Nutrition Discharge: Nutrition Assessments - 06/10/18 1511      MEDFICTS Scores   Pre Score  21       Education Questionnaire Score: Knowledge Questionnaire Score - 06/10/18 1514      Knowledge Questionnaire Score   Pre Score  12/18 reviewed with patient       Goals reviewed with patient; copy given to patient.

## 2018-06-16 NOTE — Progress Notes (Signed)
Pulmonary Individual Treatment Plan  Patient Details  Name: Adam Knight MRN: [id number] Date of Birth: November 14, 1941 Referring Provider:     Pulmonary Rehab from 06/10/2018 in [facility] Cardiac and Pulmonary Rehab  Referring Provider  Povsic, Marcello Moores MD      Initial Encounter Date:    Pulmonary Rehab from 06/10/2018 in [facility] Cardiac and Pulmonary Rehab  Date  06/10/18  Referring Provider  Povsic, Marcello Moores MD      Visit Diagnosis: Dyspnea on exertion  Patient's Home Medications on Admission:  Current Outpatient Medications:  .  Calcium Carbonate Antacid (CALCIUM CARBONATE, DOSED IN MG ELEMENTAL CALCIUM,) 1250 MG/5ML SUSP, Take by mouth., Disp: , Rfl:  .  Cholecalciferol (VITAMIN D) 2000 UNITS tablet, Take 2,000 Units by mouth daily., Disp: , Rfl:  .  rivaroxaban (XARELTO) 20 MG TABS tablet, Take 1 tablet (20 mg total) by mouth daily., Disp: 30 tablet, Rfl: 5 .  spironolactone (ALDACTONE) 25 MG tablet, Take 25 mg by mouth 2 (two) times daily., Disp: , Rfl:   Past Medical History: Past Medical History:  Diagnosis Date  . Dyslipidemia   . Edema   . Gastric ulcer   . OSA on CPAP   . Prediabetes   . Pulmonary embolism ([place])   . Seasonal allergies     Tobacco Use: Social History   Tobacco Use  Smoking Status Former Smoker  . Packs/day: 0.30  . Years: 7.00  . Pack years: 2.10  . Types: Cigarettes  . Last attempt to quit: 1967  . Years since quitting: 52.5  Smokeless Tobacco Never Used    Labs: Recent Review Flowsheet Data    There is no flowsheet data to display.       Pulmonary Assessment Scores: Pulmonary Assessment Scores    Row Name 06/10/18 1512         ADL UCSD   ADL Phase  Entry     SOB Score total  43     Rest  0     Walk  5     Stairs  3     Bath  0     Shop  3       CAT Score   CAT Score  9       mMRC Score   mMRC Score  3.5        Pulmonary Function Assessment: Pulmonary Function Assessment - 06/10/18 1514      Breath   Bilateral Breath  Sounds  Clear    Shortness of Breath  Yes;Limiting activity       Exercise Target Goals:    Exercise Program Goal: Individual exercise prescription set using results from initial 6 min walk test and THRR while considering  patient's activity barriers and safety.    Exercise Prescription Goal: Initial exercise prescription builds to 30-45 minutes a day of aerobic activity, 2-3 days per week.  Home exercise guidelines will be given to patient during program as part of exercise prescription that the participant will acknowledge.  Activity Barriers & Risk Stratification: Activity Barriers & Cardiac Risk Stratification - 06/10/18 1604      Activity Barriers & Cardiac Risk Stratification   Activity Barriers  Deconditioning;Arthritis;Muscular Weakness;Shortness of Breath;History of Falls;Balance Concerns bilateral knee arthritis       6 Minute Walk: 6 Minute Walk    Row Name 06/10/18 1601         6 Minute Walk   Phase  Initial     Distance  830 feet  Walk Time  5.32 minutes     # of Rest Breaks  1 test terminated due to desaturation     MPH  1.78     METS  1.92     RPE  15     Perceived Dyspnea   4     VO2 Peak  6.73     Symptoms  Yes (comment)     Comments  SOB     Resting HR  75 bpm     Resting BP  132/64     Resting Oxygen Saturation   91 %     Exercise Oxygen Saturation  during 6 min walk  78 %     Max Ex. HR  163 bpm     Max Ex. BP  146/72     2 Minute Post BP  136/74       Interval HR   1 Minute HR  107     2 Minute HR  136     3 Minute HR  134     4 Minute HR  162     5 Minute HR  163     6 Minute HR  159     2 Minute Post HR  93     Interval Heart Rate?  Yes       Interval Oxygen   Interval Oxygen?  Yes     Baseline Oxygen Saturation %  91 %     1 Minute Oxygen Saturation %  89 %     1 Minute Liters of Oxygen  0 L Room Air     2 Minute Oxygen Saturation %  90 %     2 Minute Liters of Oxygen  0 L     3 Minute Oxygen Saturation %  87 %     3 Minute  Liters of Oxygen  0 L     4 Minute Oxygen Saturation %  84 %     4 Minute Liters of Oxygen  0 L     5 Minute Oxygen Saturation %  81 %     5 Minute Liters of Oxygen  0 L     6 Minute Oxygen Saturation %  78 % test terminated at 5:19     6 Minute Liters of Oxygen  0 L     2 Minute Post Oxygen Saturation %  93 %     2 Minute Post Liters of Oxygen  0 L       Oxygen Initial Assessment: Oxygen Initial Assessment - 06/10/18 1515      Home Oxygen   Home Oxygen Device  None    Sleep Oxygen Prescription  CPAP    Liters per minute  0    Home Exercise Oxygen Prescription  None    Home at Rest Exercise Oxygen Prescription  None    Compliance with Home Oxygen Use  Yes      Initial 6 min Walk   Oxygen Used  None      Program Oxygen Prescription   Program Oxygen Prescription  None      Intervention   Short Term Goals  To learn and understand importance of maintaining oxygen saturations>88%;To learn and demonstrate proper pursed lip breathing techniques or other breathing techniques.;To learn and understand importance of monitoring SPO2 with pulse oximeter and demonstrate accurate use of the pulse oximeter.    Long  Term Goals  Compliance with respiratory medication;Exhibits proper breathing techniques, such as pursed  lip breathing or other method taught during program session;Maintenance of O2 saturations>88%;Verbalizes importance of monitoring SPO2 with pulse oximeter and return demonstration       Oxygen Re-Evaluation:   Oxygen Discharge (Final Oxygen Re-Evaluation):   Initial Exercise Prescription: Initial Exercise Prescription - 06/10/18 1600      Date of Initial Exercise RX and Referring Provider   Date  06/10/18    Referring Provider  Povsic, Marcello Moores MD      Treadmill   MPH  1.2    Grade  0    Minutes  15    METs  1.97      Recumbant Bike   Level  1    RPM  35    Watts  5    Minutes  15    METs  1.9      NuStep   Level  1    SPM  80    Minutes  15    METs  1.9        Prescription Details   Frequency (times per week)  3    Duration  Progress to 45 minutes of aerobic exercise without signs/symptoms of physical distress      Intensity   THRR 40-80% of Max Heartrate  103-130    Ratings of Perceived Exertion  11-13    Perceived Dyspnea  0-4      Progression   Progression  Continue to progress workloads to maintain intensity without signs/symptoms of physical distress.      Resistance Training   Training Prescription  Yes    Weight  3 lbs    Reps  10-15       Perform Capillary Blood Glucose checks as needed.  Exercise Prescription Changes: Exercise Prescription Changes    Row Name 06/10/18 1600             Response to Exercise   Blood Pressure (Admit)  132/64       Blood Pressure (Exercise)  146/72       Blood Pressure (Exit)  136/74       Heart Rate (Admit)  75 bpm       Heart Rate (Exercise)  163 bpm       Heart Rate (Exit)  93 bpm       Oxygen Saturation (Admit)  91 %       Oxygen Saturation (Exercise)  78 %       Oxygen Saturation (Exit)  93 %       Rating of Perceived Exertion (Exercise)  15       Perceived Dyspnea (Exercise)  4       Symptoms  SOB       Comments  walk test results          Exercise Comments:   Exercise Goals and Review: Exercise Goals    Row Name 06/10/18 1607             Exercise Goals   Increase Physical Activity  Yes       Intervention  Provide advice, education, support and counseling about physical activity/exercise needs.;Develop an individualized exercise prescription for aerobic and resistive training based on initial evaluation findings, risk stratification, comorbidities and participant's personal goals.       Expected Outcomes  Long Term: Add in home exercise to make exercise part of routine and to increase amount of physical activity.;Short Term: Attend rehab on a regular basis to increase amount of physical activity.;Long Term: Exercising regularly at  least 3-5 days a week.        Increase Strength and Stamina  Yes       Intervention  Provide advice, education, support and counseling about physical activity/exercise needs.;Develop an individualized exercise prescription for aerobic and resistive training based on initial evaluation findings, risk stratification, comorbidities and participant's personal goals.       Expected Outcomes  Short Term: Increase workloads from initial exercise prescription for resistance, speed, and METs.;Short Term: Perform resistance training exercises routinely during rehab and add in resistance training at home;Long Term: Improve cardiorespiratory fitness, muscular endurance and strength as measured by increased METs and functional capacity (6MWT)       Able to understand and use rate of perceived exertion (RPE) scale  Yes       Intervention  Provide education and explanation on how to use RPE scale       Expected Outcomes  Short Term: Able to use RPE daily in rehab to express subjective intensity level;Long Term:  Able to use RPE to guide intensity level when exercising independently       Able to understand and use Dyspnea scale  Yes       Intervention  Provide education and explanation on how to use Dyspnea scale       Expected Outcomes  Long Term: Able to use Dyspnea scale to guide intensity level when exercising independently;Short Term: Able to use Dyspnea scale daily in rehab to express subjective sense of shortness of breath during exertion       Knowledge and understanding of Target Heart Rate Range (THRR)  Yes       Intervention  Provide education and explanation of THRR including how the numbers were predicted and where they are located for reference       Expected Outcomes  Short Term: Able to state/look up THRR;Short Term: Able to use daily as guideline for intensity in rehab;Long Term: Able to use THRR to govern intensity when exercising independently       Able to check pulse independently  Yes       Intervention  Provide education and  demonstration on how to check pulse in carotid and radial arteries.;Review the importance of being able to check your own pulse for safety during independent exercise       Expected Outcomes  Short Term: Able to explain why pulse checking is important during independent exercise;Long Term: Able to check pulse independently and accurately       Understanding of Exercise Prescription  Yes       Intervention  Provide education, explanation, and written materials on patient's individual exercise prescription       Expected Outcomes  Short Term: Able to explain program exercise prescription;Long Term: Able to explain home exercise prescription to exercise independently          Exercise Goals Re-Evaluation :   Discharge Exercise Prescription (Final Exercise Prescription Changes): Exercise Prescription Changes - 06/10/18 1600      Response to Exercise   Blood Pressure (Admit)  132/64    Blood Pressure (Exercise)  146/72    Blood Pressure (Exit)  136/74    Heart Rate (Admit)  75 bpm    Heart Rate (Exercise)  163 bpm    Heart Rate (Exit)  93 bpm    Oxygen Saturation (Admit)  91 %    Oxygen Saturation (Exercise)  78 %    Oxygen Saturation (Exit)  93 %    Rating of Perceived Exertion (  Exercise)  15    Perceived Dyspnea (Exercise)  4    Symptoms  SOB    Comments  walk test results       Nutrition:  Target Goals: Understanding of nutrition guidelines, daily intake of sodium '1500mg'$ , cholesterol '200mg'$ , calories 30% from fat and 7% or less from saturated fats, daily to have 5 or more servings of fruits and vegetables.  Biometrics: Pre Biometrics - 06/10/18 1608      Pre Biometrics   Height  5' 3.2" (1.605 m)    Weight  216 lb 12.8 oz (98.3 kg)    Waist Circumference  47.5 inches    Hip Circumference  41.5 inches    Waist to Hip Ratio  1.14 %    BMI (Calculated)  38.18    Single Leg Stand  1.37 seconds        Nutrition Therapy Plan and Nutrition Goals: Nutrition Therapy & Goals -  06/10/18 1511      Personal Nutrition Goals   Comments  He wants to work weight loss. He wants to make sure he is getting enough nutrients with what he eats.      Intervention Plan   Intervention  Prescribe, educate and counsel regarding individualized specific dietary modifications aiming towards targeted core components such as weight, hypertension, lipid management, diabetes, heart failure and other comorbidities.;Nutrition handout(s) given to patient.    Expected Outcomes  Short Term Goal: Understand basic principles of dietary content, such as calories, fat, sodium, cholesterol and nutrients.;Long Term Goal: Adherence to prescribed nutrition plan.       Nutrition Assessments: Nutrition Assessments - 06/10/18 1511      MEDFICTS Scores   Pre Score  21       Nutrition Goals Re-Evaluation:   Nutrition Goals Discharge (Final Nutrition Goals Re-Evaluation):   Psychosocial: Target Goals: Acknowledge presence or absence of significant depression and/or stress, maximize coping skills, provide positive support system. Participant is able to verbalize types and ability to use techniques and skills needed for reducing stress and depression.   Initial Review & Psychosocial Screening: Initial Psych Review & Screening - 06/10/18 1508      Initial Review   Current issues with  Current Stress Concerns    Source of Stress Concerns  Poor Coping Skills;Unable to perform yard/household activities;Chronic Illness    Comments  He cannot do the yardwork due to his breathing.       Family Dynamics   Good Support System?  Yes    Comments  He has three sons that he can talk to and one close by that gives him support      Barriers   Psychosocial barriers to participate in program  The patient should benefit from training in stress management and relaxation.      Screening Interventions   Interventions  Encouraged to exercise;Program counselor consult;Provide feedback about the scores to  participant;To provide support and resources with identified psychosocial needs    Expected Outcomes  Long Term Goal: Stressors or current issues are controlled or eliminated.;Short Term goal: Utilizing psychosocial counselor, staff and physician to assist with identification of specific Stressors or current issues interfering with healing process. Setting desired goal for each stressor or current issue identified.;Short Term goal: Identification and review with participant of any Quality of Life or Depression concerns found by scoring the questionnaire.;Long Term goal: The participant improves quality of Life and PHQ9 Scores as seen by post scores and/or verbalization of changes  Quality of Life Scores:  Scores of 19 and below usually indicate a poorer quality of life in these areas.  A difference of  2-3 points is a clinically meaningful difference.  A difference of 2-3 points in the total score of the Quality of Life Index has been associated with significant improvement in overall quality of life, self-image, physical symptoms, and general health in studies assessing change in quality of life.  PHQ-9: Recent Review Flowsheet Data    Depression screen [facility] 2/9 06/10/2018   Decreased Interest 0   Down, Depressed, Hopeless 0   PHQ - 2 Score 0   Altered sleeping 2   Tired, decreased energy 2   Change in appetite 0   Feeling bad or failure about yourself  0   Trouble concentrating 0   Moving slowly or fidgety/restless 0   Suicidal thoughts 2    PHQ-9 Score 6   Difficult doing work/chores Not difficult at all     Interpretation of Total Score  Total Score Depression Severity:  1-4 = Minimal depression, 5-9 = Mild depression, 10-14 = Moderate depression, 15-19 = Moderately severe depression, 20-27 = Severe depression   Psychosocial Evaluation and Intervention:   Psychosocial Re-Evaluation:   Psychosocial Discharge (Final Psychosocial Re-Evaluation):   Education: Education  Goals: Education classes will be provided on a weekly basis, covering required topics. Participant will state understanding/return demonstration of topics presented.  Learning Barriers/Preferences: Learning Barriers/Preferences - 06/10/18 1514      Learning Barriers/Preferences   Learning Barriers  Sight wears glasses    Learning Preferences  None       Education Topics:  Initial Evaluation Education: - Verbal, written and demonstration of respiratory meds, oximetry and breathing techniques. Instruction on use of nebulizers and MDIs and importance of monitoring MDI activations.   Pulmonary Rehab from 06/10/2018 in [facility] Cardiac and Pulmonary Rehab  Date  06/10/18  Educator  [facility]  Instruction Review Code  1- Verbalizes Understanding      General Nutrition Guidelines/Fats and Fiber: -Group instruction provided by verbal, written material, models and posters to present the general guidelines for heart healthy nutrition. Gives an explanation and review of dietary fats and fiber.   Controlling Sodium/Reading Food Labels: -Group verbal and written material supporting the discussion of sodium use in heart healthy nutrition. Review and explanation with models, verbal and written materials for utilization of the food label.   Exercise Physiology & General Exercise Guidelines: - Group verbal and written instruction with models to review the exercise physiology of the cardiovascular system and associated critical values. Provides general exercise guidelines with specific guidelines to those with heart or lung disease.    Aerobic Exercise & Resistance Training: - Gives group verbal and written instruction on the various components of exercise. Focuses on aerobic and resistive training programs and the benefits of this training and how to safely progress through these programs.   Flexibility, Balance, Mind/Body Relaxation: Provides group verbal/written instruction on the benefits of flexibility  and balance training, including mind/body exercise modes such as yoga, pilates and tai chi.  Demonstration and skill practice provided.   Stress and Anxiety: - Provides group verbal and written instruction about the health risks of elevated stress and causes of high stress.  Discuss the correlation between heart/lung disease and anxiety and treatment options. Review healthy ways to manage with stress and anxiety.   Depression: - Provides group verbal and written instruction on the correlation between heart/lung disease and depressed mood, treatment options, and  the stigmas associated with seeking treatment.   Exercise & Equipment Safety: - Individual verbal instruction and demonstration of equipment use and safety with use of the equipment.   Pulmonary Rehab from 06/10/2018 in [facility] Cardiac and Pulmonary Rehab  Date  06/10/18  Educator  [facility]  Instruction Review Code  1- Verbalizes Understanding      Infection Prevention: - Provides verbal and written material to individual with discussion of infection control including proper hand washing and proper equipment cleaning during exercise session.   Pulmonary Rehab from 06/10/2018 in [facility] Cardiac and Pulmonary Rehab  Date  06/10/18  Educator  [facility]  Instruction Review Code  1- Verbalizes Understanding      Falls Prevention: - Provides verbal and written material to individual with discussion of falls prevention and safety.   Pulmonary Rehab from 06/10/2018 in [facility] Cardiac and Pulmonary Rehab  Date  06/10/18  Educator  [facility]  Instruction Review Code  1- Verbalizes Understanding      Diabetes: - Individual verbal and written instruction to review signs/symptoms of diabetes, desired ranges of glucose level fasting, after meals and with exercise. Advice that pre and post exercise glucose checks will be done for 3 sessions at entry of program.   Chronic Lung Diseases: - Group verbal and written instruction to review updates, respiratory  medications, advancements in procedures and treatments. Discuss use of supplemental oxygen including available portable oxygen systems, continuous and intermittent flow rates, concentrators, personal use and safety guidelines. Review proper use of inhaler and spacers. Provide informative websites for self-education.    Energy Conservation: - Provide group verbal and written instruction for methods to conserve energy, plan and organize activities. Instruct on pacing techniques, use of adaptive equipment and posture/positioning to relieve shortness of breath.   Triggers and Exacerbations: - Group verbal and written instruction to review types of environmental triggers and ways to prevent exacerbations. Discuss weather changes, air quality and the benefits of nasal washing. Review warning signs and symptoms to help prevent infections. Discuss techniques for effective airway clearance, coughing, and vibrations.   AED/CPR: - Group verbal and written instruction with the use of models to demonstrate the basic use of the AED with the basic ABC's of resuscitation.   Anatomy and Physiology of the Lungs: - Group verbal and written instruction with the use of models to provide basic lung anatomy and physiology related to function, structure and complications of lung disease.   Anatomy & Physiology of the Heart: - Group verbal and written instruction and models provide basic cardiac anatomy and physiology, with the coronary electrical and arterial systems. Review of Valvular disease and Heart Failure   Cardiac Medications: - Group verbal and written instruction to review commonly prescribed medications for heart disease. Reviews the medication, class of the drug, and side effects.   Know Your Numbers and Risk Factors: -Group verbal and written instruction about important numbers in your health.  Discussion of what are risk factors and how they play a role in the disease process.  Review of  Cholesterol, Blood Pressure, Diabetes, and BMI and the role they play in your overall health.   Sleep Hygiene: -Provides group verbal and written instruction about how sleep can affect your health.  Define sleep hygiene, discuss sleep cycles and impact of sleep habits. Review good sleep hygiene tips.    Other: -Provides group and verbal instruction on various topics (see comments)    Knowledge Questionnaire Score: Knowledge Questionnaire Score - 06/10/18 1514  Knowledge Questionnaire Score   Pre Score  12/18 reviewed with patient        Core Components/Risk Factors/Patient Goals at Admission: Personal Goals and Risk Factors at Admission - 06/10/18 1516      Core Components/Risk Factors/Patient Goals on Admission    Weight Management  Yes;Weight Loss;Weight Maintenance;Obesity    Intervention  Weight Management: Provide education and appropriate resources to help participant work on and attain dietary goals.;Weight Management: Develop a combined nutrition and exercise program designed to reach desired caloric intake, while maintaining appropriate intake of nutrient and fiber, sodium and fats, and appropriate energy expenditure required for the weight goal.;Weight Management/Obesity: Establish reasonable short term and long term weight goals.;Obesity: Provide education and appropriate resources to help participant work on and attain dietary goals.    Admit Weight  216 lb 12.8 oz (98.3 kg)    Goal Weight: Short Term  211 lb (95.7 kg)    Goal Weight: Long Term  145 lb (65.8 kg)    Expected Outcomes  Short Term: Continue to assess and modify interventions until short term weight is achieved;Long Term: Adherence to nutrition and physical activity/exercise program aimed toward attainment of established weight goal;Weight Maintenance: Understanding of the daily nutrition guidelines, which includes 25-35% calories from fat, 7% or less cal from saturated fats, less than '200mg'$  cholesterol, less  than 1.5gm of sodium, & 5 or more servings of fruits and vegetables daily;Weight Loss: Understanding of general recommendations for a balanced deficit meal plan, which promotes 1-2 lb weight loss per week and includes a negative energy balance of [phone] kcal/d;Understanding recommendations for meals to include 15-35% energy as protein, 25-35% energy from fat, 35-60% energy from carbohydrates, less than '200mg'$  of dietary cholesterol, 20-35 gm of total fiber daily;Understanding of distribution of calorie intake throughout the day with the consumption of 4-5 meals/snacks    Improve shortness of breath with ADL's  Yes    Intervention  Provide education, individualized exercise plan and daily activity instruction to help decrease symptoms of SOB with activities of daily living.    Expected Outcomes  Short Term: Improve cardiorespiratory fitness to achieve a reduction of symptoms when performing ADLs;Long Term: Be able to perform more ADLs without symptoms or delay the onset of symptoms    Heart Failure  Yes    Intervention  Provide a combined exercise and nutrition program that is supplemented with education, support and counseling about heart failure. Directed toward relieving symptoms such as shortness of breath, decreased exercise tolerance, and extremity edema.    Expected Outcomes  Improve functional capacity of life;Short term: Daily weights obtained and reported for increase. Utilizing diuretic protocols set by physician.;Short term: Attendance in program 2-3 days a week with increased exercise capacity. Reported lower sodium intake. Reported increased fruit and vegetable intake. Reports medication compliance.;Long term: Adoption of self-care skills and reduction of barriers for early signs and symptoms recognition and intervention leading to self-care maintenance.    Lipids  Yes    Intervention  Provide education and support for participant on nutrition & aerobic/resistive exercise along with prescribed  medications to achieve LDL '70mg'$ , HDL >'40mg'$ .    Expected Outcomes  Short Term: Participant states understanding of desired cholesterol values and is compliant with medications prescribed. Participant is following exercise prescription and nutrition guidelines.;Long Term: Cholesterol controlled with medications as prescribed, with individualized exercise RX and with personalized nutrition plan. Value goals: LDL < '70mg'$ , HDL > 40 mg.       Core Components/Risk Factors/Patient Goals  Review:    Core Components/Risk Factors/Patient Goals at Discharge (Final Review):    ITP Comments: ITP Comments    Row Name 06/10/18 1408 06/16/18 1626 06/16/18 1627       ITP Comments  Medical Evaluation completed. Chart sent for review and changes to Dr. Caryl Comes for Dr. Emily Filbert Director of [facility]. Diagnosis can be found in [facility] encounter 05/30/18  Patient called to say he is not interested in doing [facility] and would like to be discharged.  Discharge ITP sent and signed by Dr. Sabra Heck.        Comments: Discharge ITP

## 2018-06-16 NOTE — Telephone Encounter (Signed)
Patient called to say he is not interested in doing [facility] and would like to be discharged.

## 2018-06-18 ENCOUNTER — Ambulatory Visit: Payer: Medicare Other

## 2018-06-20 ENCOUNTER — Ambulatory Visit: Payer: Medicare Other

## 2018-06-23 ENCOUNTER — Ambulatory Visit: Payer: Medicare Other

## 2018-06-25 ENCOUNTER — Ambulatory Visit: Payer: Medicare Other

## 2018-06-27 ENCOUNTER — Ambulatory Visit: Payer: Medicare Other

## 2018-06-30 ENCOUNTER — Ambulatory Visit: Payer: Medicare Other

## 2018-07-02 ENCOUNTER — Ambulatory Visit: Payer: Medicare Other

## 2018-07-04 ENCOUNTER — Ambulatory Visit: Payer: Medicare Other

## 2018-07-07 ENCOUNTER — Ambulatory Visit: Payer: Medicare Other

## 2018-07-09 ENCOUNTER — Ambulatory Visit: Payer: Medicare Other

## 2018-07-11 ENCOUNTER — Ambulatory Visit: Payer: Medicare Other

## 2018-07-14 ENCOUNTER — Ambulatory Visit: Payer: Medicare Other

## 2018-07-16 ENCOUNTER — Ambulatory Visit: Payer: Medicare Other

## 2018-07-18 ENCOUNTER — Ambulatory Visit: Payer: Medicare Other

## 2018-07-21 ENCOUNTER — Ambulatory Visit: Payer: Medicare Other

## 2018-07-23 ENCOUNTER — Ambulatory Visit: Payer: Medicare Other

## 2018-07-25 ENCOUNTER — Ambulatory Visit: Payer: Medicare Other

## 2018-07-28 ENCOUNTER — Ambulatory Visit: Payer: Medicare Other

## 2018-07-30 ENCOUNTER — Ambulatory Visit: Payer: Medicare Other

## 2018-08-01 ENCOUNTER — Ambulatory Visit: Payer: Medicare Other

## 2018-08-04 ENCOUNTER — Ambulatory Visit: Payer: Medicare Other

## 2018-08-06 ENCOUNTER — Ambulatory Visit: Payer: Medicare Other

## 2018-08-08 ENCOUNTER — Ambulatory Visit: Payer: Medicare Other

## 2018-08-11 ENCOUNTER — Ambulatory Visit: Payer: Medicare Other

## 2018-08-13 ENCOUNTER — Ambulatory Visit: Payer: Medicare Other

## 2018-08-15 ENCOUNTER — Ambulatory Visit: Payer: Medicare Other

## 2018-08-18 ENCOUNTER — Ambulatory Visit: Payer: Medicare Other

## 2018-08-20 ENCOUNTER — Ambulatory Visit: Payer: Medicare Other

## 2018-08-22 ENCOUNTER — Ambulatory Visit: Payer: Medicare Other

## 2018-08-27 ENCOUNTER — Ambulatory Visit: Payer: Medicare Other

## 2018-08-29 ENCOUNTER — Ambulatory Visit: Payer: Medicare Other

## 2018-09-01 ENCOUNTER — Ambulatory Visit: Payer: Medicare Other

## 2018-09-03 ENCOUNTER — Ambulatory Visit: Payer: Medicare Other

## 2018-09-05 ENCOUNTER — Ambulatory Visit: Payer: Medicare Other

## 2018-09-08 ENCOUNTER — Ambulatory Visit: Payer: Medicare Other

## 2018-09-10 ENCOUNTER — Ambulatory Visit: Payer: Medicare Other

## 2018-10-24 HISTORY — PX: ENDARTERECTOMY: SHX5162

## 2018-10-26 DIAGNOSIS — Z789 Other specified health status: Secondary | ICD-10-CM | POA: Insufficient documentation

## 2018-11-02 ENCOUNTER — Other Ambulatory Visit: Payer: Self-pay | Admitting: Internal Medicine

## 2018-11-03 NOTE — Telephone Encounter (Signed)
Please review for refill. Thanks!  

## 2018-11-10 DIAGNOSIS — R5381 Other malaise: Secondary | ICD-10-CM | POA: Insufficient documentation

## 2018-11-10 DIAGNOSIS — R6889 Other general symptoms and signs: Secondary | ICD-10-CM | POA: Insufficient documentation

## 2018-11-10 DIAGNOSIS — Z7409 Other reduced mobility: Secondary | ICD-10-CM | POA: Insufficient documentation

## 2018-11-13 ENCOUNTER — Telehealth: Payer: Self-pay | Admitting: Internal Medicine

## 2018-11-13 NOTE — Telephone Encounter (Signed)
Patient declined appt seeing another provider deleting recall per patient request

## 2019-01-21 ENCOUNTER — Other Ambulatory Visit (INDEPENDENT_AMBULATORY_CARE_PROVIDER_SITE_OTHER): Payer: Self-pay | Admitting: Vascular Surgery

## 2019-01-21 DIAGNOSIS — I2694 Multiple subsegmental pulmonary emboli without acute cor pulmonale: Secondary | ICD-10-CM

## 2019-01-22 ENCOUNTER — Encounter (INDEPENDENT_AMBULATORY_CARE_PROVIDER_SITE_OTHER): Payer: Self-pay | Admitting: Vascular Surgery

## 2019-01-22 ENCOUNTER — Ambulatory Visit (INDEPENDENT_AMBULATORY_CARE_PROVIDER_SITE_OTHER): Payer: Medicare Other | Admitting: Vascular Surgery

## 2019-01-22 ENCOUNTER — Ambulatory Visit (INDEPENDENT_AMBULATORY_CARE_PROVIDER_SITE_OTHER): Payer: Medicare Other

## 2019-01-22 ENCOUNTER — Other Ambulatory Visit: Payer: Self-pay | Admitting: Pediatrics

## 2019-01-22 VITALS — BP 142/76 | HR 86 | Resp 16 | Ht 63.2 in | Wt 209.8 lb

## 2019-01-22 DIAGNOSIS — N644 Mastodynia: Secondary | ICD-10-CM

## 2019-01-22 DIAGNOSIS — I2724 Chronic thromboembolic pulmonary hypertension: Secondary | ICD-10-CM | POA: Diagnosis not present

## 2019-01-22 DIAGNOSIS — E782 Mixed hyperlipidemia: Secondary | ICD-10-CM

## 2019-01-22 DIAGNOSIS — N62 Hypertrophy of breast: Secondary | ICD-10-CM

## 2019-01-22 DIAGNOSIS — I2602 Saddle embolus of pulmonary artery with acute cor pulmonale: Secondary | ICD-10-CM

## 2019-01-22 DIAGNOSIS — I2694 Multiple subsegmental pulmonary emboli without acute cor pulmonale: Secondary | ICD-10-CM

## 2019-01-22 DIAGNOSIS — Z87891 Personal history of nicotine dependence: Secondary | ICD-10-CM

## 2019-01-22 DIAGNOSIS — I890 Lymphedema, not elsewhere classified: Secondary | ICD-10-CM | POA: Diagnosis not present

## 2019-01-25 ENCOUNTER — Encounter (INDEPENDENT_AMBULATORY_CARE_PROVIDER_SITE_OTHER): Payer: Self-pay | Admitting: Vascular Surgery

## 2019-01-25 NOTE — Progress Notes (Signed)
MRN : [id number]  Adam Knight is a 78 y.o. (Sep 15, 1941) male who presents with chief complaint of  Chief Complaint  Patient presents with  . Follow-up  .  History of Present Illness:   The patient presents to the office for evaluation of DVT with PE.  DVT was identified at [facility] by Duplex ultrasound.  The initial symptoms were pain and swelling in the lower extremity associated with SOB.  The patient notes the leg continues to be a little painful with dependency and swells a bite.  Symptoms are much better with elevation.  The patient notes minimal edema in the morning which steadily worsens throughout the day.    In November he had a pulmonary thrombectomy done at [facility] which has helped his breathing quite a bit  The patient has not been using compression therapy at this point.  No SOB or pleuritic chest pains.  No cough or hemoptysis.  No blood per rectum or blood in any sputum.  No excessive bruising per the patient.       Current Meds  Medication Sig  . Calcium Carbonate Antacid (CALCIUM CARBONATE, DOSED IN MG ELEMENTAL CALCIUM,) 1250 MG/5ML SUSP Take by mouth.  . Cholecalciferol (VITAMIN D) 2000 UNITS tablet Take 2,000 Units by mouth daily.  . midodrine (PROAMATINE) 5 MG tablet Take by mouth 3 (three) times daily.  Marland Kitchen spironolactone (ALDACTONE) 25 MG tablet Take 25 mg by mouth 2 (two) times daily.  Alveda Reasons 20 MG TABS tablet TAKE 1 TABLET BY MOUTH EVERY DAY    Past Medical History:  Diagnosis Date  . Dyslipidemia   . Edema   . Gastric ulcer   . OSA on CPAP   . Prediabetes   . Pulmonary embolism ([place])   . Seasonal allergies     Past Surgical History:  Procedure Laterality Date  . PULMONARY VENOGRAPHY Bilateral 01/08/2018   Procedure: PULMONARY VENOGRAPHY possible thrombolysis;  Surgeon: Katha Cabal, MD;  Location: [facility];  Service: Cardiovascular;  Laterality: Bilateral;  . TONSILLECTOMY      Social History Social History   Tobacco  Use  . Smoking status: Former Smoker    Packs/day: 0.30    Years: 7.00    Pack years: 2.10    Types: Cigarettes    Last attempt to quit: 1967    Years since quitting: 53.1  . Smokeless tobacco: Never Used  Substance Use Topics  . Alcohol use: Not Currently  . Drug use: No    Family History Family History  Problem Relation Age of Onset  . Stroke Mother   . Coronary artery disease Mother   . Osteoporosis Mother   . Rheum arthritis Mother   . Thyroid disease Mother     Allergies  Allergen Reactions  . Sulfa Antibiotics Itching and Swelling  . Penicillins Itching    Has patient had a PCN reaction causing immediate rash, facial/tongue/throat swelling, SOB or lightheadedness with hypotension: Yes Has patient had a PCN reaction causing severe rash involving mucus membranes or skin necrosis: No Has patient had a PCN reaction that required hospitalization: No Has patient had a PCN reaction occurring within the last 10 years: No If all of the above answers are "NO", then may proceed with Cephalosporin use.     REVIEW OF SYSTEMS (Negative unless checked)  Constitutional: [] Weight loss  [] Fever  [] Chills Cardiac: [] Chest pain   [] Chest pressure   [] Palpitations   [] Shortness of breath when laying flat   [] Shortness  of breath with exertion. Vascular:  [] Pain in legs with walking   [] Pain in legs at rest  [x] History of DVT   [] Phlebitis   [] Swelling in legs   [] Varicose veins   [] Non-healing ulcers Pulmonary:   [] Uses home oxygen   [] Productive cough   [] Hemoptysis   [] Wheeze  [] COPD   [] Asthma Neurologic:  [] Dizziness   [] Seizures   [] History of stroke   [] History of TIA  [] Aphasia   [] Vissual changes   [] Weakness or numbness in arm   [] Weakness or numbness in leg Musculoskeletal:   [] Joint swelling   [] Joint pain   [] Low back pain Hematologic:  [x] Easy bruising  [] Easy bleeding   [x] Hypercoagulable state   [] Anemic Gastrointestinal:  [] Diarrhea   [] Vomiting  [] Gastroesophageal  reflux/heartburn   [] Difficulty swallowing. Genitourinary:  [] Chronic kidney disease   [] Difficult urination  [] Frequent urination   [] Blood in urine Skin:  [] Rashes   [] Ulcers  Psychological:  [] History of anxiety   []  History of major depression.  Physical Examination  Vitals:   01/22/19 0943  BP: (!) 142/76  Pulse: 86  Resp: 16  Weight: 209 lb 12.8 oz (95.2 kg)  Height: 5' 3.2" (1.605 m)   Body mass index is 36.93 kg/m. Gen: WD/WN, NAD Head: [place]/AT, No temporalis wasting.  Ear/Nose/Throat: Hearing grossly intact, nares w/o erythema or drainage Eyes: PER, EOMI, sclera nonicteric.  Neck: Supple, no large masses.   Pulmonary:  Good air movement, no audible wheezing bilaterally, no use of accessory muscles.  Cardiac: RRR, no JVD Vascular: scattered varicosities present bilaterally.  Mild venous stasis changes to the legs bilaterally.  2+ soft pitting edema Vessel Right Left  Radial Palpable Palpable  Gastrointestinal: Non-distended. No guarding/no peritoneal signs.  Musculoskeletal: M/S 5/5 throughout.  No deformity or atrophy.  Neurologic: CN 2-12 intact. Symmetrical.  Speech is fluent. Motor exam as listed above. Psychiatric: Judgment intact, Mood & affect appropriate for pt's clinical situation. Dermatologic: No rashes or ulcers noted.  No changes consistent with cellulitis. Lymph : No lichenification or skin changes of chronic lymphedema.  CBC Lab Results  Component Value Date   WBC 5.5 01/09/2018   HGB 15.1 01/09/2018   HCT 44.9 01/09/2018   MCV 90.5 01/09/2018   PLT 317 01/09/2018    BMET    Component Value Date/Time   NA 134 (L) 01/09/2018 0209   K 4.4 01/09/2018 0209   CL 104 01/09/2018 0209   CO2 21 (L) 01/09/2018 0209   GLUCOSE 147 (H) 01/09/2018 0209   BUN 21 (H) 01/09/2018 0209   CREATININE 1.05 01/09/2018 0209   CALCIUM 8.3 (L) 01/09/2018 0209   GFRNONAA >60 01/09/2018 0209   GFRAA >60 01/09/2018 0209   CrCl cannot be calculated (Patient's most  recent lab result is older than the maximum 21 days allowed.).  COAG Lab Results  Component Value Date   INR 1.05 01/06/2018    Radiology Vas [place] Lower Extremity Venous (dvt)  Result Date: 01/22/2019  Lower Venous Study Other Indications: History PE's. Comparison Study: 01/07/2018 Performing Technologist: Concha Norway RVT  Examination Guidelines: A complete evaluation includes B-mode imaging, spectral Doppler, color Doppler, and power Doppler as needed of all accessible portions of each vessel. Bilateral testing is considered an integral part of a complete examination. Limited examinations for reoccurring indications may be performed as noted.  Right Venous Findings: ++---------------+---------+-----------+----------+-------+ CompressibilityPhasicitySpontaneityPropertiesSummary ++---------------+---------+-----------+----------+-------+ Full           Yes      Yes                          ++---------------+---------+-----------+----------+-------+  Left Venous Findings: ++---------------+---------+-----------+----------+-------+ CompressibilityPhasicitySpontaneityPropertiesSummary ++---------------+---------+-----------+----------+-------+ Full           Yes      Yes                          ++---------------+---------+-----------+----------+-------+    Summary: Right: There is no evidence of deep vein thrombosis in the lower extremity.There is no evidence of superficial venous thrombosis. Left: There is no evidence of deep vein thrombosis in the lower extremity.There is no evidence of superficial venous thrombosis.  *See table(s) above for measurements and observations. Electronically signed by Hortencia Pilar MD on 01/22/2019 at 5:47:40 PM.    Final      Assessment/Plan 1. Acute saddle pulmonary embolism with acute cor pulmonale (HCC) Recommend:   No surgery or intervention at this point in time.  IVC filter is not indicated at present.  Patient's duplex ultrasound of  the venous system is - for DVT.  The patient is initiated on anticoagulation   Elevation was stressed, use of a recliner was discussed.  I have had a long discussion with the patient regarding DVT and post phlebitic changes such as swelling and why it  causes symptoms such as pain.  The patient will wear graduated compression stockings class 1 (20-30 mmHg), beginning after three full days of anticoagulation, on a daily basis a prescription was given. The patient will  beginning wearing the stockings first thing in the morning and removing them in the evening. The patient is instructed specifically not to sleep in the stockings.  In addition, behavioral modification including elevation during the day and avoidance of prolonged dependency will be initiated.    The patient will continue anticoagulation for now as there have not been any problems or complications at this point.   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 Lymphedema of both lower extremities No surgery or intervention at this point in time.  I have reviewed my discussion with the patient regarding venous insufficiency and why it causes symptoms. I have discussed with the patient the chronic skin changes that accompany venous insufficiency and the long term sequela such as ulceration. Patient will contnue wearing graduated compression stockings on a daily basis, as this has provided excellent control of his edema. The patient will put the stockings on first thing in the morning and removing them in the evening. The patient is reminded not to sleep in the stockings.  In addition, behavioral modification including elevation during the day will be initiated. Exercise is strongly encouraged.  Given the patient's good control and lack of any problems regarding the venous insufficiency  and lymphedema a lymph pump in not need at this time.  The patient will follow up with me PRN should anything change.  The patient voices agreement with this plan.   4. Hyperlipidemia, mixed Continue statin as ordered and reviewed, no changes at this time     Hortencia Pilar, MD  01/25/2019 1:47 PM

## 2019-01-29 ENCOUNTER — Ambulatory Visit
Admission: RE | Admit: 2019-01-29 | Discharge: 2019-01-29 | Disposition: A | Discharge status: Home / Self Care | Payer: Medicare Other | Source: Ambulatory Visit | Attending: Pediatrics | Admitting: Pediatrics

## 2019-01-29 DIAGNOSIS — N644 Mastodynia: Secondary | ICD-10-CM | POA: Diagnosis present

## 2019-01-29 DIAGNOSIS — N62 Hypertrophy of breast: Secondary | ICD-10-CM

## 2019-02-12 ENCOUNTER — Ambulatory Visit: Payer: Medicare Other | Attending: Neurology

## 2019-02-12 DIAGNOSIS — G4733 Obstructive sleep apnea (adult) (pediatric): Secondary | ICD-10-CM | POA: Diagnosis not present

## 2019-03-05 ENCOUNTER — Ambulatory Visit: Payer: Medicare Other | Attending: Neurology

## 2019-03-05 ENCOUNTER — Telehealth (INDEPENDENT_AMBULATORY_CARE_PROVIDER_SITE_OTHER): Payer: Self-pay | Admitting: Internal Medicine

## 2019-03-05 DIAGNOSIS — Z79899 Other long term (current) drug therapy: Secondary | ICD-10-CM

## 2019-03-05 DIAGNOSIS — G4733 Obstructive sleep apnea (adult) (pediatric): Secondary | ICD-10-CM | POA: Diagnosis not present

## 2019-03-05 NOTE — Telephone Encounter (Signed)
I attempted to call Adam Knight back at [facility] pulmonary at [phone]. I spoke with Mickel Baas. She states [name] just left for the day.  I advised I was trying to find out what is going on with Adam Knight as we have not seen him since 05/01/18. The patient's medication list states he is on Xarelto.  Per Mickel Baas, she knows "there is a lot going on with the patient" and [name] is more familiar with him. I asked that [name] call me back tomorrow to discuss. Per Mickel Baas, she will give her the message.

## 2019-03-05 NOTE — Telephone Encounter (Signed)
Lindi Adie from [facility] calling Patient is coming in on 3/18 to see C. Sharolyn Douglas, patient is needing to been seen by one of our providers before INR can be checked [name] would like to know if patient should start coumadin 5 days prior to appointment with C. Sharolyn Douglas in case he can get his INR checked on same day Please call to discuss at [phone], ok to leave message

## 2019-03-06 NOTE — Telephone Encounter (Signed)
incoming call from [name], [place] with [facility] pulmonology, she reported that their clinic is starting her on coumadin but would prefer for [place] to manage INR checks.   She called to verify that our office could manage. I confirmed appt with Ignacia Bayley, NP on 3/18 and placed him in new patient slot for coumadin RN. Pt agreed with POC.   Order placed for POCT INR.    Message forwarded to provider as FYI.

## 2019-03-09 ENCOUNTER — Emergency Department
Admission: EM | Admit: 2019-03-09 | Discharge: 2019-03-09 | Disposition: A | Discharge status: Home / Self Care | Payer: Medicare Other | Attending: Student in an Organized Health Care Education/Training Program | Admitting: Student in an Organized Health Care Education/Training Program

## 2019-03-09 ENCOUNTER — Other Ambulatory Visit: Payer: Self-pay

## 2019-03-09 ENCOUNTER — Emergency Department: Payer: Medicare Other

## 2019-03-09 DIAGNOSIS — R0602 Shortness of breath: Secondary | ICD-10-CM | POA: Diagnosis not present

## 2019-03-09 DIAGNOSIS — R0600 Dyspnea, unspecified: Secondary | ICD-10-CM | POA: Diagnosis not present

## 2019-03-09 DIAGNOSIS — Z79899 Other long term (current) drug therapy: Secondary | ICD-10-CM | POA: Diagnosis not present

## 2019-03-09 DIAGNOSIS — R05 Cough: Secondary | ICD-10-CM | POA: Insufficient documentation

## 2019-03-09 DIAGNOSIS — Z87891 Personal history of nicotine dependence: Secondary | ICD-10-CM | POA: Diagnosis not present

## 2019-03-09 DIAGNOSIS — R7303 Prediabetes: Secondary | ICD-10-CM | POA: Diagnosis not present

## 2019-03-09 DIAGNOSIS — R059 Cough, unspecified: Secondary | ICD-10-CM

## 2019-03-09 DIAGNOSIS — J40 Bronchitis, not specified as acute or chronic: Secondary | ICD-10-CM | POA: Diagnosis not present

## 2019-03-09 LAB — CBC WITH DIFFERENTIAL/PLATELET
Abs Immature Granulocytes: 0.03 10*3/uL (ref 0.00–0.07)
BASOS PCT: 1 %
Basophils Absolute: 0 10*3/uL (ref 0.0–0.1)
EOS ABS: 0.2 10*3/uL (ref 0.0–0.5)
Eosinophils Relative: 3 %
HCT: 42.3 % (ref 39.0–52.0)
Hemoglobin: 13.8 g/dL (ref 13.0–17.0)
Immature Granulocytes: 1 %
Lymphocytes Relative: 34 %
Lymphs Abs: 2.1 10*3/uL (ref 0.7–4.0)
MCH: 26.8 pg (ref 26.0–34.0)
MCHC: 32.6 g/dL (ref 30.0–36.0)
MCV: 82.1 fL (ref 80.0–100.0)
Monocytes Absolute: 1 10*3/uL (ref 0.1–1.0)
Monocytes Relative: 17 %
Neutro Abs: 2.7 10*3/uL (ref 1.7–7.7)
Neutrophils Relative %: 44 %
PLATELETS: 387 10*3/uL (ref 150–400)
RBC: 5.15 MIL/uL (ref 4.22–5.81)
RDW: 13.9 % (ref 11.5–15.5)
WBC: 6.1 10*3/uL (ref 4.0–10.5)
nRBC: 0 % (ref 0.0–0.2)

## 2019-03-09 LAB — COMPREHENSIVE METABOLIC PANEL
ALT: 24 U/L (ref 0–44)
ANION GAP: 12 (ref 5–15)
AST: 35 U/L (ref 15–41)
Albumin: 3.7 g/dL (ref 3.5–5.0)
Alkaline Phosphatase: 57 U/L (ref 38–126)
BUN: 29 mg/dL — ABNORMAL HIGH (ref 8–23)
CO2: 29 mmol/L (ref 22–32)
Calcium: 8.5 mg/dL — ABNORMAL LOW (ref 8.9–10.3)
Chloride: 90 mmol/L — ABNORMAL LOW (ref 98–111)
Creatinine, Ser: 1.15 mg/dL (ref 0.61–1.24)
GFR calc Af Amer: >60 mL/min (ref >60)
Glucose, Bld: 106 mg/dL — ABNORMAL HIGH (ref 70–99)
Potassium: 3.2 mmol/L — ABNORMAL LOW (ref 3.5–5.1)
Sodium: 131 mmol/L — ABNORMAL LOW (ref 135–145)
Total Bilirubin: 0.4 mg/dL (ref 0.3–1.2)
Total Protein: 7.2 g/dL (ref 6.5–8.1)

## 2019-03-09 LAB — LACTIC ACID, PLASMA: Lactic Acid, Venous: 1 mmol/L (ref 0.5–1.9)

## 2019-03-09 LAB — URINALYSIS, COMPLETE (UACMP) WITH MICROSCOPIC
Bacteria, UA: NONE SEEN
Bilirubin Urine: NEGATIVE
Glucose, UA: NEGATIVE mg/dL
HGB URINE DIPSTICK: NEGATIVE
Ketones, ur: NEGATIVE mg/dL
Leukocytes,Ua: NEGATIVE
Nitrite: NEGATIVE
Protein, ur: NEGATIVE mg/dL
Specific Gravity, Urine: 1.013 (ref 1.005–1.030)
pH: 5 (ref 5.0–8.0)

## 2019-03-09 LAB — TROPONIN I
Troponin I: <0.03 ng/mL (ref <0.03)
Troponin I: <0.03 ng/mL (ref <0.03)

## 2019-03-09 LAB — PROTIME-INR
INR: 1 (ref 0.8–1.2)
Prothrombin Time: 13.5 seconds (ref 11.4–15.2)

## 2019-03-09 MED ORDER — IPRATROPIUM-ALBUTEROL 0.5-2.5 (3) MG/3ML IN SOLN
3.0000 mL | Freq: Once | RESPIRATORY_TRACT | Status: AC
  Administered 2019-03-09: 3 mL via RESPIRATORY_TRACT
  Filled 2019-03-09: qty 3

## 2019-03-09 MED ORDER — ALBUTEROL SULFATE HFA 108 (90 BASE) MCG/ACT IN AERS: 2.0000 | INHALATION_SPRAY | Freq: Four times a day (QID) | RESPIRATORY_TRACT | 2 refills | Status: DC | PRN

## 2019-03-09 MED ORDER — PREDNISONE 20 MG PO TABS: 40.0000 mg | ORAL_TABLET | Freq: Every day | ORAL | 0 refills | Status: AC

## 2019-03-09 MED ORDER — AZITHROMYCIN 500 MG PO TABS: 500.0000 mg | ORAL_TABLET | Freq: Every day | ORAL | 0 refills | Status: AC

## 2019-03-09 MED ORDER — PREDNISONE 20 MG PO TABS
60.0000 mg | ORAL_TABLET | Freq: Once | ORAL | Status: AC
  Administered 2019-03-09: 60 mg via ORAL
  Filled 2019-03-09: qty 3

## 2019-03-09 NOTE — ED Triage Notes (Signed)
Patient presents to the ED with shortness of breath.  Patient states he has history of blood clots.  Patient seems confused. Patient states, "they took my heart and lungs out at [facility] in November."  Patient has a list with him that says, "lungs, urine, flu, and confusion" that he states his wife wrote. Patient reports "raspiness in his chest."

## 2019-03-09 NOTE — ED Notes (Signed)
This RN went to get v/s on patient and he was not in subwait or bathroom across hall

## 2019-03-09 NOTE — ED Provider Notes (Addendum)
[facility] Emergency Department Provider Note    First MD Initiated Contact with Patient 03/09/19 1902     (approximate)  I have reviewed the triage vital signs and the nursing notes.   HISTORY  Chief Complaint Shortness of Breath    HPI Adam Knight is a 78 y.o. male presents the ER with the below listed past medical history.  Patient states he has been having cough and some shortness of breath for the past 3 days.  No measured fevers.  States the cough is clear.  Denies any orthopnea.  Patient walked into the room in no respiratory distress.  Denies any chest pain or pressure.  States that several months ago he was admitted and they "took his heart and lungs out ".    Past Medical History:  Diagnosis Date  . Dyslipidemia   . Edema   . Gastric ulcer   . OSA on CPAP   . Prediabetes   . Pulmonary embolism ([place])   . Seasonal allergies    Family History  Problem Relation Age of Onset  . Stroke Mother   . Coronary artery disease Mother   . Osteoporosis Mother   . Rheum arthritis Mother   . Thyroid disease Mother    Past Surgical History:  Procedure Laterality Date  . PULMONARY VENOGRAPHY Bilateral 01/08/2018   Procedure: PULMONARY VENOGRAPHY possible thrombolysis;  Surgeon: Katha Cabal, MD;  Location: [facility];  Service: Cardiovascular;  Laterality: Bilateral;  . TONSILLECTOMY     Patient Active Problem List   Diagnosis Date Noted  . 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 Atypical chest pain 05/02/2018  . Palpitations 05/02/2018  . Long term (current) use of anticoagulants 02/18/2018  . Pulmonary embolus ([place]) 01/06/2018  . Primary osteoarthritis of both knees 11/10/2017  . Lymphedema of both lower extremities 10/25/2017  . Hyperlipidemia, mixed 06/01/2016  . Mild depression ([place]) 06/01/2016  . Dyspnea on exertion 03/01/2016  . Non morbid obesity due to excess calories 03/01/2016   . PVD (peripheral vascular disease) ([place]) 03/01/2016  . Primary osteoarthritis of left knee 04/20/2015  . Primary osteoarthritis of right knee 04/20/2015  . BMI 40.0-44.9, adult ([place]) 04/20/2015  . DOE (dyspnea on exertion) 01/26/2015  . OSA on CPAP 01/26/2015  . Obesity 01/26/2015  . Chronic edema 01/13/2015  . Hyperglycemia, unspecified 01/13/2015  . Nephrolithiasis 01/13/2015  . Obstructive sleep apnea 05/08/2013      Prior to Admission medications   Medication Sig Start Date End Date Taking? Authorizing Provider  albuterol (PROVENTIL HFA;VENTOLIN HFA) 108 (90 Base) MCG/ACT inhaler Inhale 2 puffs into the lungs every 6 (six) hours as needed for wheezing or shortness of breath. 03/09/19   Merlyn Lot, MD  azithromycin (ZITHROMAX) 500 MG tablet Take 1 tablet (500 mg total) by mouth daily for 3 days. Take 1 tablet daily for 3 days. 03/09/19 03/12/19  Merlyn Lot, MD  Calcium Carbonate Antacid (CALCIUM CARBONATE, DOSED IN MG ELEMENTAL CALCIUM,) 1250 MG/5ML SUSP Take by mouth.    [provider]  Cholecalciferol (VITAMIN D) 2000 UNITS tablet Take 2,000 Units by mouth daily.    [provider]  midodrine (PROAMATINE) 5 MG tablet Take by mouth 3 (three) times daily. 12/11/18   [provider]  predniSONE (DELTASONE) 20 MG tablet Take 2 tablets (40 mg total) by mouth daily for 5 days. 03/09/19 03/14/19  Merlyn Lot, MD  spironolactone (ALDACTONE) 25 MG tablet Take 25 mg by mouth 2 (  two) times daily.    [provider]  XARELTO 20 MG TABS tablet TAKE 1 TABLET BY MOUTH EVERY DAY 11/03/18   End, Harrell Gave, MD    Allergies Sulfa antibiotics and Penicillins    Social History Social History   Tobacco Use  . Smoking status: Former Smoker    Packs/day: 0.30    Years: 7.00    Pack years: 2.10    Types: Cigarettes    Last attempt to quit: 1967    Years since quitting: 53.2  . Smokeless tobacco: Never Used  Substance Use Topics  .  Alcohol use: Not Currently  . Drug use: No    Review of Systems Patient denies headaches, rhinorrhea, blurry vision, numbness, shortness of breath, chest pain, edema, cough, abdominal pain, nausea, vomiting, diarrhea, dysuria, fevers, rashes or hallucinations unless otherwise stated above in HPI. ____________________________________________   PHYSICAL EXAM:  VITAL SIGNS: Vitals:   03/09/19 1558 03/09/19 1853  BP: 118/73 123/75  Pulse: 96 92  Resp: (!) 22 18  Temp: 98.8 F (37.1 C)   SpO2: 95% 97%    Constitutional: Alert and oriented.  Eyes: Conjunctivae are normal.  Head: Atraumatic. Nose: No congestion/rhinnorhea. Mouth/Throat: Mucous membranes are moist.   Neck: No stridor. Painless ROM.  Cardiovascular: Normal rate, regular rhythm. Grossly normal heart sounds.  Good peripheral circulation. Respiratory: No significant tachypnea.  Does have bilateral wheeze but good air movement throughout.  No crackles.  No rhonchi  gastrointestinal: Soft and nontender. No distention. No abdominal bruits. No CVA tenderness. Genitourinary:  Musculoskeletal: No lower extremity tenderness nor edema.  No joint effusions. Neurologic:  Normal speech and language. No gross focal neurologic deficits are appreciated. No facial droop Skin:  Skin is warm, dry and intact. No rash noted. Psychiatric: Mood and affect are normal. Speech and behavior are normal.  ____________________________________________   LABS (all labs ordered are listed, but only abnormal results are displayed)  Results for orders placed or performed during the hospital encounter of 03/09/19 (from the past 24 hour(s))  Troponin I - ONCE - STAT     Status: None   Collection Time: 03/09/19  4:15 PM  Result Value Ref Range   Troponin I <0.03 <0.03 ng/mL  Lactic acid, plasma     Status: None   Collection Time: 03/09/19  4:15 PM  Result Value Ref Range   Lactic Acid, Venous 1.0 0.5 - 1.9 mmol/L  Comprehensive metabolic panel      Status: Abnormal   Collection Time: 03/09/19  4:15 PM  Result Value Ref Range   Sodium 131 (L) 135 - 145 mmol/L   Potassium 3.2 (L) 3.5 - 5.1 mmol/L   Chloride 90 (L) 98 - 111 mmol/L   CO2 29 22 - 32 mmol/L   Glucose, Bld 106 (H) 70 - 99 mg/dL   BUN 29 (H) 8 - 23 mg/dL   Creatinine, Ser 1.15 0.61 - 1.24 mg/dL   Calcium 8.5 (L) 8.9 - 10.3 mg/dL   Total Protein 7.2 6.5 - 8.1 g/dL   Albumin 3.7 3.5 - 5.0 g/dL   AST 35 15 - 41 U/L   ALT 24 0 - 44 U/L   Alkaline Phosphatase 57 38 - 126 U/L   Total Bilirubin 0.4 0.3 - 1.2 mg/dL   GFR calc non Af Amer >60 >60 mL/min   GFR calc Af Amer >60 >60 mL/min   Anion gap 12 5 - 15  CBC with Differential     Status: None  Collection Time: 03/09/19  4:15 PM  Result Value Ref Range   WBC 6.1 4.0 - 10.5 K/uL   RBC 5.15 4.22 - 5.81 MIL/uL   Hemoglobin 13.8 13.0 - 17.0 g/dL   HCT 42.3 39.0 - 52.0 %   MCV 82.1 80.0 - 100.0 fL   MCH 26.8 26.0 - 34.0 pg   MCHC 32.6 30.0 - 36.0 g/dL   RDW 13.9 11.5 - 15.5 %   Platelets 387 150 - 400 K/uL   nRBC 0.0 0.0 - 0.2 %   Neutrophils Relative % 44 %   Neutro Abs 2.7 1.7 - 7.7 K/uL   Lymphocytes Relative 34 %   Lymphs Abs 2.1 0.7 - 4.0 K/uL   Monocytes Relative 17 %   Monocytes Absolute 1.0 0.1 - 1.0 K/uL   Eosinophils Relative 3 %   Eosinophils Absolute 0.2 0.0 - 0.5 K/uL   Basophils Relative 1 %   Basophils Absolute 0.0 0.0 - 0.1 K/uL   Immature Granulocytes 1 %   Abs Immature Granulocytes 0.03 0.00 - 0.07 K/uL  Urinalysis, Complete w Microscopic     Status: Abnormal   Collection Time: 03/09/19  4:16 PM  Result Value Ref Range   Color, Urine YELLOW (A) YELLOW   APPearance CLEAR (A) CLEAR   Specific Gravity, Urine 1.013 1.005 - 1.030   pH 5.0 5.0 - 8.0   Glucose, UA NEGATIVE NEGATIVE mg/dL   Hgb urine dipstick NEGATIVE NEGATIVE   Bilirubin Urine NEGATIVE NEGATIVE   Ketones, ur NEGATIVE NEGATIVE mg/dL   Protein, ur NEGATIVE NEGATIVE mg/dL   Nitrite NEGATIVE NEGATIVE   Leukocytes,Ua NEGATIVE  NEGATIVE   RBC / HPF 0-5 0 - 5 RBC/hpf   WBC, UA 0-5 0 - 5 WBC/hpf   Bacteria, UA NONE SEEN NONE SEEN   Squamous Epithelial / LPF 0-5 0 - 5  Protime-INR     Status: None   Collection Time: 03/09/19  8:31 PM  Result Value Ref Range   Prothrombin Time 13.5 11.4 - 15.2 seconds   INR 1.0 0.8 - 1.2  Troponin I - ONCE - STAT     Status: None   Collection Time: 03/09/19  8:31 PM  Result Value Ref Range   Troponin I <0.03 <0.03 ng/mL   ____________________________________________  EKG My review and personal interpretation at Time: 16:09   Indication: cough sob  Rate: 95  Rhythm: sinus Axis: left Other: rbbb, nonspecific st abn, abnml ekg ____________________________________________  RADIOLOGY  I personally reviewed all radiographic images ordered to evaluate for the above acute complaints and reviewed radiology reports and findings.  These findings were personally discussed with the patient.  Please see medical record for radiology report.  ____________________________________________   PROCEDURES  Procedure(s) performed:  Procedures    Critical Care performed: no ____________________________________________   INITIAL IMPRESSION / ASSESSMENT AND PLAN / ED COURSE  Pertinent labs & imaging results that were available during my care of the patient were reviewed by me and considered in my medical decision making (see chart for details).   DDX: Asthma, copd, CHF, pna, ptx, malignancy, Pe, anemia   Adam Knight is a 78 y.o. who presents to the ED with was as described above.  Clinically sounds similar to bronchitis.  Chest x-ray consistent with bronchitis as well.  Patient is on anticoagulation therefore do not feel this is consistent with PE.  Will give nebs and reassess.  Clinical Course as of Mar 09 2107  Mon Mar 09, 2019  2035 Patient  is still on Xarelto.  EKG is slightly changed from previous.  Denies any pain right now but based on his history will further evaluate  with repeat troponin.  Will give nebulizers and reassess.   [PR]  2105 Repeat troponin is negative.  Patient did have significant coughing spells after nebulizer therefore I have a high suspicion for bronchitis.  Patient will be discharged with steroids as well as nebulizer and azithromycin   [PR]    Clinical Course User Index [PR] Merlyn Lot, MD     As part of my medical decision making, I reviewed the following data within the [facility] notes reviewed and incorporated, Labs reviewed, notes from prior ED visits and [place] Controlled Substance Database   ____________________________________________   FINAL CLINICAL IMPRESSION(S) / ED DIAGNOSES  Final diagnoses:  Cough  Bronchitis  Dyspnea, unspecified type      NEW MEDICATIONS STARTED DURING THIS VISIT:  New Prescriptions   ALBUTEROL (PROVENTIL HFA;VENTOLIN HFA) 108 (90 BASE) MCG/ACT INHALER    Inhale 2 puffs into the lungs every 6 (six) hours as needed for wheezing or shortness of breath.   AZITHROMYCIN (ZITHROMAX) 500 MG TABLET    Take 1 tablet (500 mg total) by mouth daily for 3 days. Take 1 tablet daily for 3 days.   PREDNISONE (DELTASONE) 20 MG TABLET    Take 2 tablets (40 mg total) by mouth daily for 5 days.     Note:  This document was prepared using Dragon voice recognition software and may include unintentional dictation errors.    Merlyn Lot, MD 03/09/19 2107    Merlyn Lot, MD 03/09/19 2108

## 2019-03-10 ENCOUNTER — Telehealth: Payer: Self-pay | Admitting: Nurse Practitioner

## 2019-03-10 NOTE — Telephone Encounter (Signed)
Call to patient in regards to COVID-19 restrictions.  ?  COVID-19 Pre-Screening:  1. Have you been in contact with someone who was sick?  NO 2. Do you have any of the following symptoms (cough, fever, muscle pain, vomiting, diarrhea, weakness abdominal pain, rash, red eye, bruising or bleeding, joint pain, severe headache)?  Has cough that started yesterday. Fever, tmax 101F, yesterday and day before.   3. Have you travelled internationally or out of state in the last month?  NO 4. Do you need any refills at this time?  n/a  Pt seen in ED last night. He did not endorse fever at that time and was dx w bronchitis.   Pt requesting to been seen dispute clinic restrictions saying, "you got more important patients you want to see?".   Reassured patient that we want to do our best to care for his cardiac needs, but d/t recent virus we have safety precautions in place and would need to reschedule his appt.  Pt started coumadin yesterday.  Message routed to provider to further advise on when blood work is needed for INR and when his f/u can occur in compliance with infection prevention regulations.

## 2019-03-10 NOTE — Telephone Encounter (Signed)
Patient returning call about appt.

## 2019-03-10 NOTE — Telephone Encounter (Signed)
With development of fever > 100.4, I would recommend f/u w/ PCP for e-visit if possible, as he may require additional viral testing. Please explain that we are screening all of our patients to minimize risk to patients in our waiting areas and community at large.  As he just started coumadin yesterday, he will not need an INR until Friday. Provided that he has been evaluated by his PCP and is free of fever x 48hrs by Friday, he can present for INR lab only on Friday (please order STAT) and we can have him f/u in coumadin clinic next week.  In the absence of cardiac symptoms, he can f/u with Dr. Saunders Revel at a later date.

## 2019-03-10 NOTE — Telephone Encounter (Signed)
Pt has new coumadin appointment tomorrow and states he just started Coumadin yesterday. Please call and advise if pt should keep this appointment.

## 2019-03-10 NOTE — Telephone Encounter (Signed)
All appts completed. Nothing further at this time.

## 2019-03-10 NOTE — Telephone Encounter (Signed)
Call to patient to make him aware of recommendations from provider. Pt cooperative with POC and scheduled for upcoming appts.   He is due to have lab draw on Friday, if he is not fever free for 48 hours, he agrees to call back for rescheduling.   Advised pt to call for any further questions or concerns.

## 2019-03-11 ENCOUNTER — Ambulatory Visit: Payer: 59 | Admitting: Nurse Practitioner

## 2019-03-13 ENCOUNTER — Telehealth: Payer: Self-pay | Admitting: Nurse Practitioner

## 2019-03-13 ENCOUNTER — Other Ambulatory Visit: Payer: Self-pay | Admitting: *Deleted

## 2019-03-13 ENCOUNTER — Other Ambulatory Visit: Payer: Self-pay

## 2019-03-13 ENCOUNTER — Ambulatory Visit (INDEPENDENT_AMBULATORY_CARE_PROVIDER_SITE_OTHER): Payer: Self-pay | Admitting: Cardiology

## 2019-03-13 ENCOUNTER — Other Ambulatory Visit (INDEPENDENT_AMBULATORY_CARE_PROVIDER_SITE_OTHER): Payer: Medicare Other

## 2019-03-13 DIAGNOSIS — I2699 Other pulmonary embolism without acute cor pulmonale: Secondary | ICD-10-CM | POA: Diagnosis not present

## 2019-03-13 DIAGNOSIS — I2724 Chronic thromboembolic pulmonary hypertension: Secondary | ICD-10-CM

## 2019-03-13 DIAGNOSIS — I2602 Saddle embolus of pulmonary artery with acute cor pulmonale: Secondary | ICD-10-CM

## 2019-03-13 LAB — PROTIME-INR
INR: 1.7 — ABNORMAL HIGH (ref 0.8–1.2)
Prothrombin Time: 17.1 s — ABNORMAL HIGH (ref 9.1–12.0)

## 2019-03-13 NOTE — Telephone Encounter (Signed)
I spoke with the patient regarding his INR results from today. Confirmed he is taking coumadin 5 mg once daily. He is aware that starting today he should take coumadin 5 mg daily except for Mondays & Fridays he should take 7.5 mg- per Ignacia Bayley, NP. The patient then advised that he is still on Xarelto. I spoke with Tiffany, RN in the Coumadin clinic- she advised she will contact the patient.  The patient is aware Tiffany will call him right back.

## 2019-03-13 NOTE — Telephone Encounter (Signed)
Notes recorded by Theora Gianotti, NP on 03/13/2019 at 10:43 AM EDT INR sub Rx. I understand that he is currently on 5mg  daily. Please have him take change dosing to 7.5mg  on Mondays and Fridays (starting today - so 7.5 today). Keep f/u in coumadin clinic on Wed.

## 2019-03-13 NOTE — Telephone Encounter (Signed)
Patient returning call.

## 2019-03-13 NOTE — Telephone Encounter (Signed)
Call received from [place] at [place]. She was calling with the patient's STAT PT/INR.  INR- 1.7/ PT- 17.4  Will forward to Ignacia Bayley, NP to review. The patient started warfarin on 03/09/19.  He is scheduled to follow up with the Coumadin Clinic on 03/18/19.

## 2019-03-16 ENCOUNTER — Other Ambulatory Visit: Payer: Self-pay

## 2019-03-16 ENCOUNTER — Ambulatory Visit (INDEPENDENT_AMBULATORY_CARE_PROVIDER_SITE_OTHER): Payer: Medicare Other | Admitting: Pharmacist

## 2019-03-16 DIAGNOSIS — I2602 Saddle embolus of pulmonary artery with acute cor pulmonale: Secondary | ICD-10-CM

## 2019-03-16 LAB — POCT INR: INR: 4.1 — AB (ref 2.0–3.0)

## 2019-03-16 NOTE — Patient Instructions (Signed)
Description   Skip your warfarin today, then start taking 1 tablet (5mg ) daily except 21/2 tablet (2.5mg ) on Sundays. Recheck INR in 1 week in [place] office.

## 2019-03-23 ENCOUNTER — Other Ambulatory Visit: Payer: Self-pay

## 2019-03-23 ENCOUNTER — Ambulatory Visit (INDEPENDENT_AMBULATORY_CARE_PROVIDER_SITE_OTHER): Payer: Medicare Other

## 2019-03-23 DIAGNOSIS — Z5181 Encounter for therapeutic drug level monitoring: Secondary | ICD-10-CM | POA: Diagnosis not present

## 2019-03-23 DIAGNOSIS — I2602 Saddle embolus of pulmonary artery with acute cor pulmonale: Secondary | ICD-10-CM | POA: Diagnosis not present

## 2019-03-23 LAB — POCT INR: INR: 8 — AB (ref 2.0–3.0)

## 2019-03-23 NOTE — Progress Notes (Signed)
Pt came thru drive-thru for INR check today. Today is my first time meeting pt. He reports that he is still taking Xarelto - states that no one told him to stop it.  Advised pt to STOP taking Xarelto - he verbalizes understanding, though he does state that this med is expensive and he does not want to waste it. Advised pt that his INR is too high for my machine to read (>8.0) and that I recommend he come in to the [facility] for STAT labs, but he refuses, stating that he is high risk for COVID-19 and will not get out of his car (pt is wearing a mask while in his car). Advised pt to have a can of spinach today, but he states the he has no way of obtaining this, as he cannot go into the grocery store-recommended he have a large serving of whatever greens he has on hand at home.

## 2019-03-23 NOTE — Patient Instructions (Signed)
STOP XARELTO! Hold coumadin through Friday, restart 5 mg on Saturday. Recheck INR in 1 week and we will reduce your dosage.

## 2019-03-27 ENCOUNTER — Telehealth: Payer: Self-pay | Admitting: *Deleted

## 2019-03-27 NOTE — Telephone Encounter (Signed)
Patient called back. He was a bit reluctant to agree after being read the consent and stated "it was so that we could get money." Explained that this was not in able to receive payment, this was to help in his continuity of care since it had been almost a year since he was in the office and that we manage his INR. He agreed to consent and was scheduled.      Virtual Visit Pre-Appointment Phone Call  Steps For Call:  1. Confirm consent - "In the setting of the current Covid19 crisis, you are scheduled for a (phone or video) visit with your provider on (date) at (time).  Just as we do with many in-office visits, in order for you to participate in this visit, we must obtain consent.  If you'd like, I can send this to your mychart (if signed up) or email for you to review.  Otherwise, I can obtain your verbal consent now.  All virtual visits are billed to your insurance company just like a normal visit would be.  By agreeing to a virtual visit, we'd like you to understand that the technology does not allow for your provider to perform an examination, and thus may limit your provider's ability to fully assess your condition.  Finally, though the technology is pretty good, we cannot assure that it will always work on either your or our end, and in the setting of a video visit, we may have to convert it to a phone-only visit.  In either situation, we cannot ensure that we have a secure connection.  Are you willing to proceed?"  2. Give patient instructions for WebEx download to smartphone as below if video visit  3. Advise patient to be prepared with any vital sign or heart rhythm information, their current medicines, and a piece of paper and pen handy for any instructions they may receive the day of their visit  4. Inform patient they will receive a phone call 15 minutes prior to their appointment time (may be from unknown caller ID) so they should be prepared to answer  5. Confirm that appointment  type is correct in Epic appointment notes (video vs telephone)    TELEPHONE CALL NOTE  Adam Knight has been deemed a candidate for a follow-up tele-health visit to limit community exposure during the Covid-19 pandemic. I spoke with the patient via phone to ensure availability of phone/video source, confirm preferred email & phone number, and discuss instructions and expectations.  I reminded Barbaraann Cao to be prepared with any vital sign and/or heart rhythm information that could potentially be obtained via home monitoring, at the time of his visit. I reminded Undrea Shipes to expect a phone call at the time of his visit if his visit.  Did the patient verbally acknowledge consent to treatment? YES  Roney Jaffe, RN 03/27/2019 12:42 PM   DOWNLOADING THE [place], go to [employer] and type in WebEx in the search bar. [place] [employer], the blue/green circle. The app is free but as with any other app downloads, their phone may require them to verify saved payment information or Apple password. The patient does NOT have to create an account.  - If Android, ask patient to go to [employer] and type in WebEx in the search bar. [place] [employer], the blue/green circle. The app is free but as with any other app downloads, their phone may require them to  verify saved payment information or Android password. The patient does NOT have to create an account.   CONSENT FOR TELE-HEALTH VISIT - PLEASE REVIEW  I hereby voluntarily request, consent and authorize [facility] and its employed or contracted physicians, physician assistants, nurse practitioners or other licensed health care professionals (the Practitioner), to provide me with telemedicine health care services (the "Services") as deemed necessary by the treating Practitioner. I acknowledge and consent to receive the Services by the Practitioner via telemedicine. I  understand that the telemedicine visit will involve communicating with the Practitioner through live audiovisual communication technology and the disclosure of certain medical information by electronic transmission. I acknowledge that I have been given the opportunity to request an in-person assessment or other available alternative prior to the telemedicine visit and am voluntarily participating in the telemedicine visit.  I understand that I have the right to withhold or withdraw my consent to the use of telemedicine in the course of my care at any time, without affecting my right to future care or treatment, and that the Practitioner or I may terminate the telemedicine visit at any time. I understand that I have the right to inspect all information obtained and/or recorded in the course of the telemedicine visit and may receive copies of available information for a reasonable fee.  I understand that some of the potential risks of receiving the Services via telemedicine include:  Marland Kitchen Delay or interruption in medical evaluation due to technological equipment failure or disruption; . Information transmitted may not be sufficient (e.g. poor resolution of images) to allow for appropriate medical decision making by the Practitioner; and/or  . In rare instances, security protocols could fail, causing a breach of personal health information.  Furthermore, I acknowledge that it is my responsibility to provide information about my medical history, conditions and care that is complete and accurate to the best of my ability. I acknowledge that Practitioner's advice, recommendations, and/or decision may be based on factors not within their control, such as incomplete or inaccurate data provided by me or distortions of diagnostic images or specimens that may result from electronic transmissions. I understand that the practice of medicine is not an exact science and that Practitioner makes no warranties or guarantees  regarding treatment outcomes. I acknowledge that I will receive a copy of this consent concurrently upon execution via email to the email address I last provided but may also request a printed copy by calling the office of [place].    I understand that my insurance will be billed for this visit.   I have read or had this consent read to me. . I understand the contents of this consent, which adequately explains the benefits and risks of the Services being provided via telemedicine.  . I have been provided ample opportunity to ask questions regarding this consent and the Services and have had my questions answered to my satisfaction. . I give my informed consent for the services to be provided through the use of telemedicine in my medical care  By participating in this telemedicine visit I agree to the above.

## 2019-03-27 NOTE — Telephone Encounter (Signed)
Called patient. We discussed it had almost been a year since his last office visit and it was advised to have some kind of follow up whether telephone or video. He stated he does have a smartphone but not able to work it for a video. I attempted to walk him through the step and we were disconnected. I tried calling him right back and was not able to get through.   I tried one more time and had to leave a message. I was able to confirm answers to the questions below.  I was not able to go over consent for telephone visit.      Cardiac Questionnaire:    Since your last visit or hospitalization:    1. Have you been having new or worsening chest pain? NO   2. Have you been having new or worsening shortness of breath? NO 3. Have you been having new or worsening leg swelling, wt gain, or increase in abdominal girth (pants fitting more tightly)? NO   4. Have you had any passing out spells? NO    *A YES to any of these questions would result in the appointment being kept. *If all the answers to these questions are NO, we should indicate that given the current situation regarding the worldwide coronarvirus pandemic, at the recommendation of the CDC, we are looking to limit gatherings in our waiting area, and thus will reschedule their appointment beyond four weeks from today.

## 2019-03-27 NOTE — Telephone Encounter (Signed)
No answer. Left message to call back.   

## 2019-03-27 NOTE — Telephone Encounter (Signed)
Pt refused to schedule his OV as Virtual visit. Pt said that it would do him and Dr. Saunders Revel no good to talk over video or phone and the best thing for [place] to do is to schedule him when we can schedule him to come in and he doesn't care how long it takes.

## 2019-03-30 ENCOUNTER — Other Ambulatory Visit: Payer: Self-pay

## 2019-03-30 ENCOUNTER — Ambulatory Visit (INDEPENDENT_AMBULATORY_CARE_PROVIDER_SITE_OTHER): Payer: Medicare Other

## 2019-03-30 DIAGNOSIS — Z5181 Encounter for therapeutic drug level monitoring: Secondary | ICD-10-CM | POA: Diagnosis not present

## 2019-03-30 DIAGNOSIS — I2602 Saddle embolus of pulmonary artery with acute cor pulmonale: Secondary | ICD-10-CM

## 2019-03-30 LAB — POCT INR: INR: 1 — AB (ref 2.0–3.0)

## 2019-03-30 NOTE — Patient Instructions (Signed)
Please take 1 tablet tonight, then START NEW DOSAGE of 1 tablet EVERY DAY except 1/2 tablet ON MONDAYS, [place]. Recheck in 1 week.

## 2019-03-31 ENCOUNTER — Other Ambulatory Visit: Payer: Self-pay | Admitting: Internal Medicine

## 2019-03-31 MED ORDER — WARFARIN SODIUM 5 MG PO TABS: 5.0000 mg | ORAL_TABLET | ORAL | 1 refills | Status: DC

## 2019-03-31 NOTE — Telephone Encounter (Signed)
Pt needs Rx, sent

## 2019-03-31 NOTE — Progress Notes (Signed)
Virtual Visit via Telephone Note   This visit type was conducted due to national recommendations for restrictions regarding the COVID-19 Pandemic (e.g. social distancing) in an effort to limit this patient's exposure and mitigate transmission in our community.  Due to his co-morbid illnesses, this patient is at least at moderate risk for complications without adequate follow up.  This format is felt to be most appropriate for this patient at this time.  The patient did not have access to video technology/had technical difficulties with video requiring transitioning to audio format only (telephone).  All issues noted in this document were discussed and addressed.  No physical exam could be performed with this format.  Verbal consent was obtained from the patient.  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 male who presents via audio/video conferencing for a telehealth visit today.  I met him in 04/2018 for evaluation of shortness of breath and chronic anticoagulation in the setting of pulmonary embolism.  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  He was subsequently seen by Dr. Loyal Buba at [facility], who recommended right heart catheterization.  He ultimately underwent RHC and bilateral pulmonary angiograms at [facility] in August.  Subsequent pulmonary endarterectomy was performed at [facility] in 10/2018.  He follows with the pulmonary hypertension clinic at [facility] (Dr. Maryland Pink).  Today, Adam Knight reports that he feels relatively well.  He has chronic  exertional dyspnea, which has been stable over the last few months and notably better than before his pulmonary endarterectomy last November.  He tries to walk regularly around his home, though this is limited by chronic knee pain.  He does not have significant edema nor chest pain or lightheadedness.  He has experienced occasional palpitations in which it feels like his heart skips a beat.  He has been compliant with his medications.  He presented to the [facility] ED last month with cough, felt to be due to bronchitis.  Potassium was low at 3.2 at that time.  He does not wish to take a potassium supplement on a regular basis.  Out of concern for recurrent thromboembolic events, Adam Knight was transitioned from rivaroxaban to warfarin earlier this year (he does not recall which provider initiated this).  He has had somewhat labile INRs, with his most recent reading being 1.02 days ago.  He is being managed closely by our anticoagulation clinic with plans for repeat INR check next Monday.  He is also due for a virtual visit with his PCP tomorrow.  The patient does not have symptoms concerning for COVID-19 infection (fever, chills, cough, or new shortness of breath).    Past Medical History:  Diagnosis Date  . Dyslipidemia   . Edema   . Gastric ulcer   . OSA on CPAP   . Prediabetes   . Pulmonary embolism ([place])   . Seasonal allergies    Past Surgical History:  Procedure Laterality Date  . PULMONARY VENOGRAPHY Bilateral 01/08/2018   Procedure: PULMONARY VENOGRAPHY possible thrombolysis;  Surgeon: Katha Cabal, MD;  Location: [place] [facility];  Service:  Cardiovascular;  Laterality: Bilateral;  . TONSILLECTOMY       Current Meds  Medication Sig  . albuterol (PROVENTIL HFA;VENTOLIN HFA) 108 (90 Base) MCG/ACT inhaler Inhale 2 puffs into the lungs every 6 (six) hours as needed for wheezing or shortness of breath.  . B Complex-C (SUPER B COMPLEX PO) Take by mouth daily.  . Calcium Carbonate  Antacid (CALCIUM CARBONATE, DOSED IN MG ELEMENTAL CALCIUM,) 1250 MG/5ML SUSP Take by mouth.  . Cholecalciferol (VITAMIN D) 2000 UNITS tablet Take 2,000 Units by mouth daily.  Marland Kitchen torsemide (DEMADEX) 20 MG tablet Take 2 tablets (40 mg total) by mouth daily.  Marland Kitchen warfarin (COUMADIN) 5 MG tablet Take 1 tablet (5 mg total) by mouth as directed. Take as directed by the coumadin clinic.  . [DISCONTINUED] torsemide (DEMADEX) 20 MG tablet Take 40 mg by mouth daily.     Allergies:   Sulfa antibiotics; Spironolactone; and Penicillins   Social History   Tobacco Use  . Smoking status: Former Smoker    Packs/day: 0.30    Years: 7.00    Pack years: 2.10    Types: Cigarettes    Last attempt to quit: 1967    Years since quitting: 53.3  . Smokeless tobacco: Never Used  Substance Use Topics  . Alcohol use: Not Currently  . Drug use: No     Family Hx: The patient's family history includes Coronary artery disease in his mother; Osteoporosis in his mother; Rheum arthritis in his mother; Stroke in his mother; Thyroid disease in his mother.  ROS:   Please see the history of present illness.   All other systems reviewed and are negative.   Prior CV studies:   The following studies were reviewed today:  RHC/pulmonary angiogram (07/25/2018, [facility]): RA 11, RV 87/15, PA 89/40 (57), PCWP 15.  Fick CO/CI 3.7/1.8.  Thermodilution CO/CI 3.3/1.6.  Multiple filling defects in the both lung consistent with embolic disease.  No thrombus noted in either main pulmonary artery.  TTE (01/06/2018): Normal LV size with mild LVH.  LVEF 55-65%.  Mild MR.  Moderately dilated RV with moderately reduced function.  Mild right atrial enlargement.  Labs/Other Tests and Data Reviewed:    EKG:  An ECG dated 03/09/2019 was personally reviewed today and demonstrated:  NSR with left axis deviation and right bundle branch block.  No significant change from prior tracing on 05/01/2018.  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  Anticoagulation was changed from rivaroxaban to warfarin earlier this year.  INRs have been somewhat labile but hopefully will stabilize with the assistance of our anticoagulation clinic.  Mr. Sookram should remain on lifelong anticoagulation and also follow with Dr. Maryland Pink at the pulmonary hypertension clinic at [facility].  He should continue torsemide 40 mg daily, as his weight and leg swelling seem  to be well controlled by his report.  Palpitations: Symptoms most suggestive of isolated PACs or PVCs, as noted on last year's Holter monitor.  This could be related to hypokalemia noted on the labs obtained last month.  I encouraged Mr. Quintanar to take standing potassium, given that he is on daily torsemide.  He does not wish to add a pill and asks that no medication changes being made today until he can have repeat labs.  I encouraged him to speak to his PCP about this tomorrow.  Alternatively, we can arrange for a BMP at his convenience.   COVID-19 Education: The signs and symptoms of COVID-19 were discussed with the patient and how to seek care for testing (follow up with PCP or arrange E-visit).  The importance of social distancing was discussed today.  Time:   Today, I have spent 12 minutes with the patient with telehealth technology discussing the above problems.     Medication Adjustments/Labs and  Tests Ordered: Current medicines are reviewed at length with the patient today.  Concerns regarding medicines are outlined above.   Tests Ordered: None.  Medication Changes: None; patient refused potassium supplementation.  Disposition:  Follow up in 6 month(s)  Signed, Nelva Bush, MD  04/01/2019 10:24 AM    [facility]

## 2019-03-31 NOTE — Telephone Encounter (Signed)
If Home Health RN is calling please get Coumadin Nurse on the phone STAT  1.  Are you calling in regards to an appointment? nO   2.  Are you calling for a refill ? yES  3.  Are you having bleeding issues? Unknown   4.  Do you need clearance to hold Coumadin? No      Please route to the Coumadin Clinic Pool

## 2019-04-01 ENCOUNTER — Encounter: Payer: Self-pay | Admitting: Internal Medicine

## 2019-04-01 ENCOUNTER — Telehealth (INDEPENDENT_AMBULATORY_CARE_PROVIDER_SITE_OTHER): Payer: Medicare Other | Admitting: Internal Medicine

## 2019-04-01 ENCOUNTER — Other Ambulatory Visit: Payer: Self-pay

## 2019-04-01 VITALS — BP 119/76 | Ht 62.0 in | Wt 212.0 lb

## 2019-04-01 DIAGNOSIS — I2724 Chronic thromboembolic pulmonary hypertension: Secondary | ICD-10-CM

## 2019-04-01 DIAGNOSIS — R002 Palpitations: Secondary | ICD-10-CM

## 2019-04-01 MED ORDER — TORSEMIDE 20 MG PO TABS: 40.0000 mg | ORAL_TABLET | Freq: Every day | ORAL | 1 refills | Status: AC

## 2019-04-01 NOTE — Patient Instructions (Addendum)
Medication Instructions:  Your physician recommends that you continue on your current medications as directed. Please refer to the Current Medication list given to you today.  If you need a refill on your cardiac medications before your next appointment, please call your pharmacy.   Lab work: none If you have labs (blood work) drawn today and your tests are completely normal, you will receive your results only by: Marland Kitchen MyChart Message (if you have MyChart) OR . A paper copy in the mail If you have any lab test that is abnormal or we need to change your treatment, we will call you to review the results.  Testing/Procedures: none  Follow-Up: At [facility], you and your health needs are our priority.  As part of our continuing mission to provide you with exceptional heart care, we have created designated Provider Care Teams.  These Care Teams include your primary Cardiologist (physician) and Advanced Practice Providers (APPs -  Physician Assistants and Nurse Practitioners) who all work together to provide you with the care you need, when you need it. You will need a follow up appointment in 6 months.  Please call our office 2 months in advance to schedule this appointment.  You may see Nelva Bush, MD or one of the following Advanced Practice Providers on your designated Care Team:   Murray Hodgkins, NP Christell Faith, PA-C . Marrianne Mood, PA-C  Any Other Special Instructions Will Be Listed Below (If Applicable).  Please eat foods that are high in potassium as your potassium was recently low.  Also, discuss potassium and possibility of needing it checked with Primary Care doctor.

## 2019-04-03 ENCOUNTER — Telehealth: Payer: Self-pay

## 2019-04-03 NOTE — Telephone Encounter (Signed)
Attempted to call pt to prescreen him for INR check on Monday, but he did not answer and his vm box has not been set up yet.

## 2019-04-06 ENCOUNTER — Ambulatory Visit (INDEPENDENT_AMBULATORY_CARE_PROVIDER_SITE_OTHER): Payer: Medicare Other

## 2019-04-06 ENCOUNTER — Ambulatory Visit: Payer: Medicare Other | Admitting: Internal Medicine

## 2019-04-06 DIAGNOSIS — I2602 Saddle embolus of pulmonary artery with acute cor pulmonale: Secondary | ICD-10-CM | POA: Diagnosis not present

## 2019-04-06 DIAGNOSIS — Z5181 Encounter for therapeutic drug level monitoring: Secondary | ICD-10-CM | POA: Diagnosis not present

## 2019-04-06 LAB — POCT INR: INR: 1.6 — AB (ref 2.0–3.0)

## 2019-04-06 NOTE — Patient Instructions (Signed)
Please take 1 tablet tonight, then resume dosage of 1 tablet EVERY DAY except 1/2 tablet ON MONDAYS, [place]. Recheck in 1 week.

## 2019-04-07 ENCOUNTER — Other Ambulatory Visit: Payer: Self-pay

## 2019-04-13 ENCOUNTER — Ambulatory Visit (INDEPENDENT_AMBULATORY_CARE_PROVIDER_SITE_OTHER): Payer: Medicare Other

## 2019-04-13 ENCOUNTER — Other Ambulatory Visit: Payer: Self-pay

## 2019-04-13 DIAGNOSIS — I2602 Saddle embolus of pulmonary artery with acute cor pulmonale: Secondary | ICD-10-CM | POA: Diagnosis not present

## 2019-04-13 DIAGNOSIS — Z5181 Encounter for therapeutic drug level monitoring: Secondary | ICD-10-CM

## 2019-04-13 LAB — POCT INR: INR: 1.4 — AB (ref 2.0–3.0)

## 2019-04-13 NOTE — Patient Instructions (Signed)
Please START NEW DOSAGE of 1 tablet every day. Recheck in 2 weeks.

## 2019-04-24 ENCOUNTER — Telehealth: Payer: Self-pay

## 2019-04-24 NOTE — Telephone Encounter (Signed)
Attempted to contact pt to prescreen prior to INR check on Monday, May 4. Asked pt to call back if he has developed a fever, travelled, or been in contact w/ anyone confirmed to have [place].

## 2019-04-27 ENCOUNTER — Other Ambulatory Visit: Payer: Self-pay

## 2019-04-27 ENCOUNTER — Ambulatory Visit (INDEPENDENT_AMBULATORY_CARE_PROVIDER_SITE_OTHER): Payer: Medicare Other

## 2019-04-27 DIAGNOSIS — I2602 Saddle embolus of pulmonary artery with acute cor pulmonale: Secondary | ICD-10-CM

## 2019-04-27 DIAGNOSIS — Z5181 Encounter for therapeutic drug level monitoring: Secondary | ICD-10-CM | POA: Diagnosis not present

## 2019-04-27 LAB — POCT INR: INR: 2.3 (ref 2.0–3.0)

## 2019-04-27 NOTE — Patient Instructions (Signed)
Please continue dosage of 1 tablet every day. Recheck in 2 weeks. 

## 2019-05-08 ENCOUNTER — Telehealth: Payer: Self-pay

## 2019-05-08 NOTE — Telephone Encounter (Signed)
Attempted to contact pt to prescreen for COVID19 prior to INR check on Monday, May 18. Left message on pt's vm that I will be at drive thru @ [place], asked him to call back if he will be unable to keep appt, if he has developed a fever, travelled out of state since his last visit, or been in contact w/ anyone w/ the virus.

## 2019-05-11 ENCOUNTER — Ambulatory Visit (INDEPENDENT_AMBULATORY_CARE_PROVIDER_SITE_OTHER): Payer: Medicare Other

## 2019-05-11 ENCOUNTER — Other Ambulatory Visit: Payer: Self-pay

## 2019-05-11 DIAGNOSIS — I2602 Saddle embolus of pulmonary artery with acute cor pulmonale: Secondary | ICD-10-CM

## 2019-05-11 DIAGNOSIS — Z5181 Encounter for therapeutic drug level monitoring: Secondary | ICD-10-CM

## 2019-05-11 LAB — POCT INR: INR: 2 (ref 2.0–3.0)

## 2019-05-11 NOTE — Patient Instructions (Signed)
Please continue dosage of 1 tablet every day.  Recheck in 3 weeks.  

## 2019-05-19 DIAGNOSIS — N1831 Chronic kidney disease, stage 3a: Secondary | ICD-10-CM | POA: Insufficient documentation

## 2019-05-19 DIAGNOSIS — N183 Chronic kidney disease, stage 3 unspecified: Secondary | ICD-10-CM | POA: Insufficient documentation

## 2019-05-29 ENCOUNTER — Telehealth: Payer: Self-pay

## 2019-05-29 NOTE — Telephone Encounter (Signed)
Attempted to contact pt to prescreen for COVID19 prior to INR check on Monday, 06/01/19. Left detailed message on pt's vm that I will be back in the office seeing pts and need to move his appt time due to new [place] regulations. Advised pt that I am moving his appt time to 10:15 and that he will need to enter thru the [place] entrance of [facility] and that he will need to wear a mask.  Asked him to call back w/ any questions or concerns.

## 2019-06-01 ENCOUNTER — Ambulatory Visit (INDEPENDENT_AMBULATORY_CARE_PROVIDER_SITE_OTHER): Payer: Medicare Other

## 2019-06-01 ENCOUNTER — Other Ambulatory Visit: Payer: Self-pay

## 2019-06-01 DIAGNOSIS — Z5181 Encounter for therapeutic drug level monitoring: Secondary | ICD-10-CM | POA: Diagnosis not present

## 2019-06-01 DIAGNOSIS — I2602 Saddle embolus of pulmonary artery with acute cor pulmonale: Secondary | ICD-10-CM

## 2019-06-01 LAB — POCT INR: INR: 2.4 (ref 2.0–3.0)

## 2019-06-01 NOTE — Patient Instructions (Signed)
Please continue dosage of 1 tablet every day. Recheck in 4 weeks.

## 2019-06-02 DIAGNOSIS — Z789 Other specified health status: Secondary | ICD-10-CM | POA: Insufficient documentation

## 2019-06-14 IMAGING — CR CHEST - 2 VIEW
1 series · 2 of 2 positions shown · non-contrast
Comparison: CT chest dated January 06, 2018.

CLINICAL DATA: Shortness of breath.

EXAM:
CHEST - 2 VIEW

[Series 1: dg chest 2 view · 0.14mm/px · 2 of 2 slices shown]
[im 1/2]
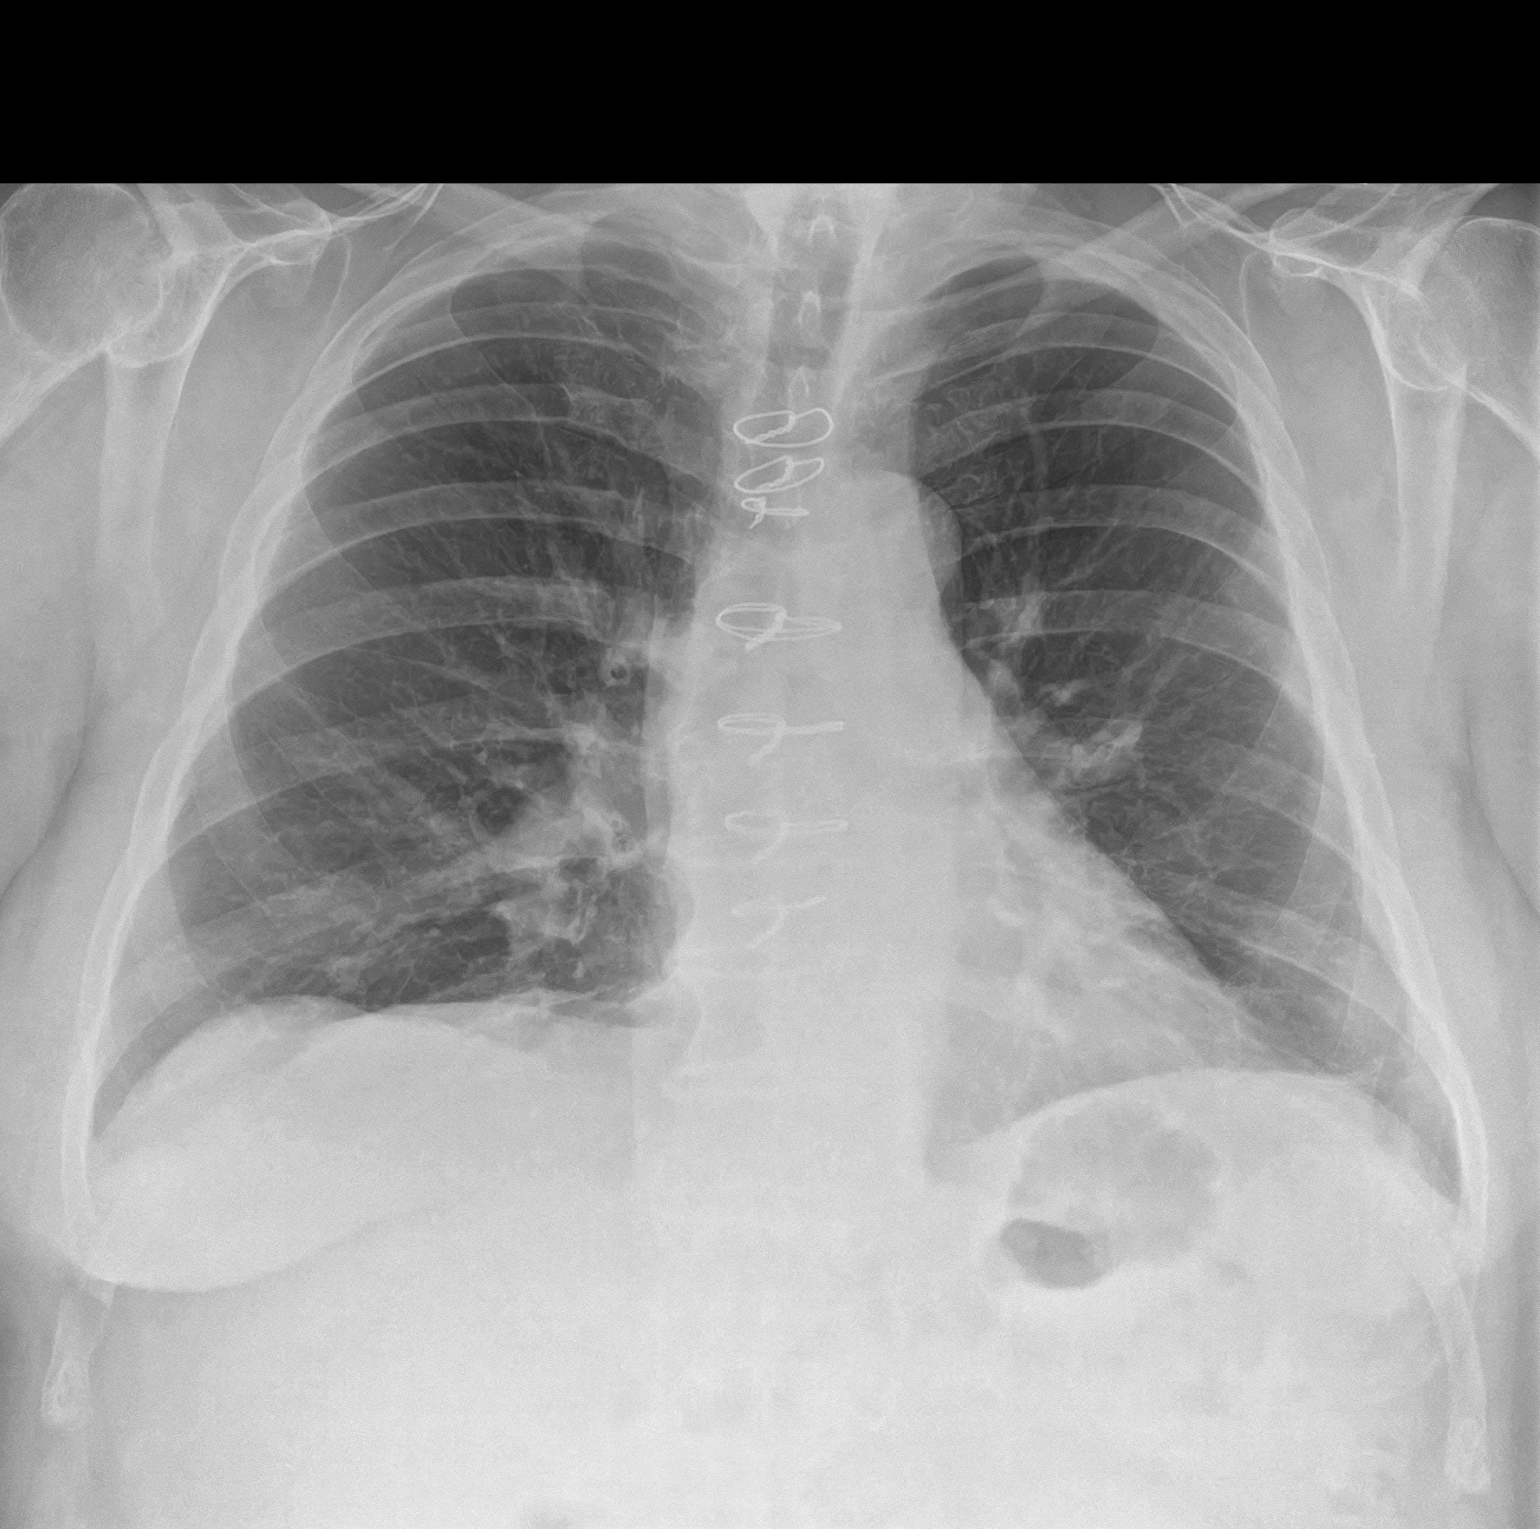
[im 2/2]
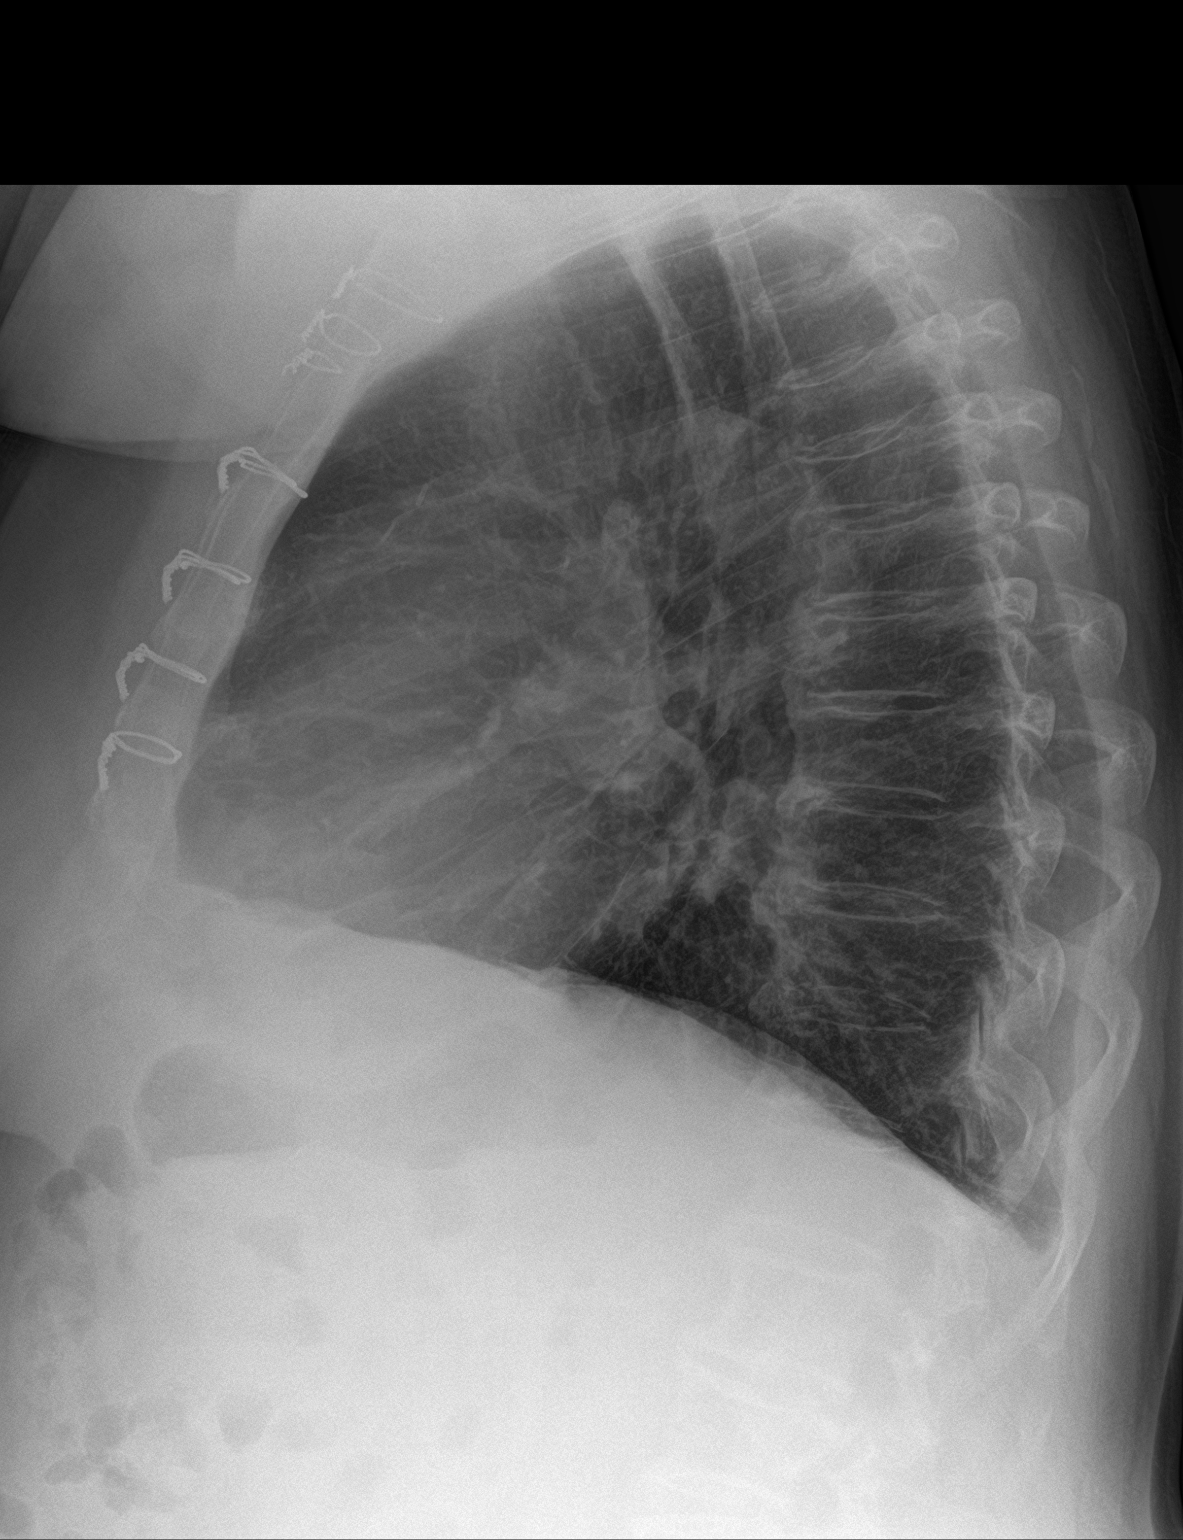

[2 of 2 positions shown; findings below may reference images not displayed]

FINDINGS: The heart size and mediastinal contours are within normal limits.
Normal pulmonary vascularity. Central peribronchial thickening.
Minimal bibasilar atelectasis. No focal consolidation, pleural
effusion, or pneumothorax. No acute osseous abnormality. Prior
median sternotomy.
IMPRESSION: Central bronchitic changes.  Minimal bibasilar atelectasis.

## 2019-06-19 ENCOUNTER — Other Ambulatory Visit: Payer: Self-pay | Admitting: Internal Medicine

## 2019-06-19 NOTE — Telephone Encounter (Signed)
Refill Request.  

## 2019-06-25 ENCOUNTER — Telehealth: Payer: Self-pay

## 2019-06-25 NOTE — Telephone Encounter (Signed)

## 2019-06-29 ENCOUNTER — Ambulatory Visit (INDEPENDENT_AMBULATORY_CARE_PROVIDER_SITE_OTHER): Payer: Medicare Other

## 2019-06-29 ENCOUNTER — Other Ambulatory Visit: Payer: Self-pay

## 2019-06-29 DIAGNOSIS — I2602 Saddle embolus of pulmonary artery with acute cor pulmonale: Secondary | ICD-10-CM

## 2019-06-29 DIAGNOSIS — Z5181 Encounter for therapeutic drug level monitoring: Secondary | ICD-10-CM | POA: Diagnosis not present

## 2019-06-29 LAB — POCT INR: INR: 3 (ref 2.0–3.0)

## 2019-06-29 NOTE — Patient Instructions (Signed)
Please continue dosage of 1 tablet every day. Recheck in 5 weeks.

## 2019-07-08 ENCOUNTER — Other Ambulatory Visit: Payer: Self-pay

## 2019-07-08 ENCOUNTER — Ambulatory Visit (INDEPENDENT_AMBULATORY_CARE_PROVIDER_SITE_OTHER): Payer: Medicare Other

## 2019-07-08 DIAGNOSIS — I2602 Saddle embolus of pulmonary artery with acute cor pulmonale: Secondary | ICD-10-CM

## 2019-07-08 DIAGNOSIS — Z5181 Encounter for therapeutic drug level monitoring: Secondary | ICD-10-CM

## 2019-07-08 LAB — POCT INR: INR: 2.9 (ref 2.0–3.0)

## 2019-07-08 NOTE — Patient Instructions (Signed)
Please continue dosage of 1 tablet every day. Recheck in 4 weeks - keep your appt on 8/12@ 8:45.

## 2019-08-05 ENCOUNTER — Other Ambulatory Visit: Payer: Self-pay

## 2019-08-05 ENCOUNTER — Ambulatory Visit (INDEPENDENT_AMBULATORY_CARE_PROVIDER_SITE_OTHER): Payer: Medicare Other

## 2019-08-05 DIAGNOSIS — Z5181 Encounter for therapeutic drug level monitoring: Secondary | ICD-10-CM

## 2019-08-05 DIAGNOSIS — I2602 Saddle embolus of pulmonary artery with acute cor pulmonale: Secondary | ICD-10-CM

## 2019-08-05 LAB — POCT INR: INR: 2.8 (ref 2.0–3.0)

## 2019-08-05 NOTE — Patient Instructions (Signed)
Please continue dosage of 1 tablet every day.  Recheck in 6 weeks.   

## 2019-08-11 ENCOUNTER — Other Ambulatory Visit: Payer: Self-pay | Admitting: Internal Medicine

## 2019-08-11 NOTE — Telephone Encounter (Signed)
Please review for refill. Thanks!  

## 2019-08-20 DIAGNOSIS — I872 Venous insufficiency (chronic) (peripheral): Secondary | ICD-10-CM | POA: Insufficient documentation

## 2019-08-20 DIAGNOSIS — Z7901 Long term (current) use of anticoagulants: Secondary | ICD-10-CM | POA: Insufficient documentation

## 2019-09-04 ENCOUNTER — Other Ambulatory Visit: Payer: Self-pay | Admitting: Internal Medicine

## 2019-09-04 NOTE — Telephone Encounter (Signed)
Please review for refill. Thanks!  

## 2019-09-04 NOTE — Telephone Encounter (Signed)
Refill Request.  

## 2019-09-16 ENCOUNTER — Ambulatory Visit (INDEPENDENT_AMBULATORY_CARE_PROVIDER_SITE_OTHER): Payer: Medicare Other

## 2019-09-16 ENCOUNTER — Other Ambulatory Visit: Payer: Self-pay

## 2019-09-16 DIAGNOSIS — Z5181 Encounter for therapeutic drug level monitoring: Secondary | ICD-10-CM

## 2019-09-16 DIAGNOSIS — I2602 Saddle embolus of pulmonary artery with acute cor pulmonale: Secondary | ICD-10-CM | POA: Diagnosis not present

## 2019-09-16 LAB — POCT INR: INR: 2.7 (ref 2.0–3.0)

## 2019-09-16 NOTE — Patient Instructions (Signed)
Please continue dosage of 1 tablet every day.  Recheck in 6 weeks.   

## 2019-09-27 ENCOUNTER — Other Ambulatory Visit: Payer: Self-pay | Admitting: Internal Medicine

## 2019-09-28 NOTE — Telephone Encounter (Signed)
Refill request

## 2019-10-13 ENCOUNTER — Other Ambulatory Visit: Payer: Self-pay | Admitting: *Deleted

## 2019-10-13 MED ORDER — WARFARIN SODIUM 5 MG PO TABS: ORAL_TABLET | ORAL | 0 refills | Status: DC

## 2019-10-28 ENCOUNTER — Ambulatory Visit (INDEPENDENT_AMBULATORY_CARE_PROVIDER_SITE_OTHER): Payer: Medicare Other

## 2019-10-28 ENCOUNTER — Other Ambulatory Visit: Payer: Self-pay

## 2019-10-28 DIAGNOSIS — Z5181 Encounter for therapeutic drug level monitoring: Secondary | ICD-10-CM

## 2019-10-28 DIAGNOSIS — I2602 Saddle embolus of pulmonary artery with acute cor pulmonale: Secondary | ICD-10-CM

## 2019-10-28 LAB — POCT INR: INR: 1.8 — AB (ref 2.0–3.0)

## 2019-10-28 NOTE — Patient Instructions (Signed)
Please take 2 tablets tonight, then continue dosage of 1 tablet every day. Recheck in 5 weeks.

## 2019-12-02 ENCOUNTER — Ambulatory Visit (INDEPENDENT_AMBULATORY_CARE_PROVIDER_SITE_OTHER): Payer: Medicare Other

## 2019-12-02 ENCOUNTER — Other Ambulatory Visit: Payer: Self-pay

## 2019-12-02 DIAGNOSIS — Z5181 Encounter for therapeutic drug level monitoring: Secondary | ICD-10-CM

## 2019-12-02 DIAGNOSIS — I2602 Saddle embolus of pulmonary artery with acute cor pulmonale: Secondary | ICD-10-CM

## 2019-12-02 LAB — POCT INR: INR: 3 (ref 2.0–3.0)

## 2019-12-02 NOTE — Patient Instructions (Signed)
Please continue dosage of 1 tablet every day.  Recheck in 6 weeks.   

## 2020-01-13 ENCOUNTER — Other Ambulatory Visit: Payer: Self-pay

## 2020-01-13 ENCOUNTER — Ambulatory Visit (INDEPENDENT_AMBULATORY_CARE_PROVIDER_SITE_OTHER): Payer: Medicare Other

## 2020-01-13 DIAGNOSIS — Z5181 Encounter for therapeutic drug level monitoring: Secondary | ICD-10-CM | POA: Diagnosis not present

## 2020-01-13 DIAGNOSIS — I2602 Saddle embolus of pulmonary artery with acute cor pulmonale: Secondary | ICD-10-CM | POA: Diagnosis not present

## 2020-01-13 LAB — POCT INR: INR: 2.4 (ref 2.0–3.0)

## 2020-01-13 NOTE — Patient Instructions (Signed)
Please continue dosage of 1 tablet every day.  Recheck in 6 weeks.   

## 2020-01-20 ENCOUNTER — Encounter: Payer: Self-pay | Admitting: Internal Medicine

## 2020-01-20 ENCOUNTER — Ambulatory Visit: Payer: Medicare Other | Admitting: Internal Medicine

## 2020-01-20 NOTE — Progress Notes (Deleted)
Follow-up Outpatient Visit Date: 01/20/2020  Primary Care Provider: Barbaraann Boys, MD [address] [facility] [place] [address]  Chief Complaint: ***  HPI:  [name] is a 79 y.o. 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  We last spoke in 04/01/2019, at which time [name] had stable exertional dyspnea that had improved after his pulmonary endarterectomy in 10/2018.  Right heart catheterization at [facility] in 08/2019 showed improved pulmonary artery pressures and PA pressure of 22 mmHg and a PCWP pressure of 13 mmHg.  --------------------------------------------------------------------------------------------------  Past Medical History:  Diagnosis Date  . 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[place])   . Dyslipidemia   . Edema   . Gastric ulcer   . OSA on CPAP   . Prediabetes   . Pulmonary embolism ([place])   . Seasonal allergies    Past Surgical History:  Procedure Laterality Date  . ENDARTERECTOMY  10/2018   Pulmonary endarterectomy for CTEPH - [facility]  . PULMONARY VENOGRAPHY Bilateral 01/08/2018   Procedure: PULMONARY VENOGRAPHY possible thrombolysis;  Surgeon: Katha Cabal, MD;  Location: [place] [facility];  Service: Cardiovascular;  Laterality: Bilateral;  . TONSILLECTOMY       Recent CV Pertinent Labs: Lab Results  Component Value Date   INR 2.4 01/13/2020   INR 1.7 (H) 03/13/2019   K 3.2 (L) 03/09/2019   BUN 29 (H) 03/09/2019   CREATININE 1.15 03/09/2019    Past medical and surgical history were reviewed and updated in EPIC.  No outpatient medications have been marked as taking for the 01/20/20 encounter (Appointment) with Fallyn Munnerlyn, Harrell Gave, MD.    Allergies: Sulfa antibiotics, Spironolactone, and Penicillins  Social History   Tobacco Use  . Smoking status: Former Smoker   Packs/day: 0.30    Years: 7.00    Pack years: 2.10    Types: Cigarettes    Quit date: 1967    Years since quitting: 54.1  . Smokeless tobacco: Never Used  Substance Use Topics  . Alcohol use: Not Currently  . Drug use: No    Family History  Problem Relation Age of Onset  . Stroke Mother   . Coronary artery disease Mother   . Osteoporosis Mother   . Rheum arthritis Mother   . Thyroid disease Mother     Review of Systems: A 12-system review of systems was performed and was negative except as noted in the HPI.  --------------------------------------------------------------------------------------------------  Physical Exam: There were no vitals taken for this visit.  General:  *** HEENT: No conjunctival pallor or scleral icterus. Facemask in place. Neck: Supple without lymphadenopathy, thyromegaly, JVD, or HJR. Lungs: Normal work of breathing. Clear to auscultation bilaterally without wheezes or crackles. Heart: Regular rate and rhythm without murmurs, rubs, or gallops. Non-displaced PMI. Abd: Bowel sounds present. Soft, NT/ND without hepatosplenomegaly Ext: No lower extremity edema. Radial, PT, and DP pulses are 2+ bilaterally. Skin: Warm and dry without rash.  EKG:  ***  Lab Results  Component Value Date   WBC 6.1 03/09/2019   HGB 13.8 03/09/2019   HCT 42.3 03/09/2019   MCV 82.1 03/09/2019   PLT 387 03/09/2019    Lab Results  Component Value Date   NA 131 (L) 03/09/2019   K 3.2 (L) 03/09/2019   CL 90 (L) 03/09/2019   CO2 29 03/09/2019   BUN 29 (H) 03/09/2019   CREATININE  1.15 03/09/2019   GLUCOSE 106 (H) 03/09/2019   ALT 24 03/09/2019    No results found for: CHOL, HDL, LDLCALC, LDLDIRECT, TRIG, CHOLHDL  --------------------------------------------------------------------------------------------------  ASSESSMENT AND PLAN: ***  Nelva Bush, MD 01/20/2020 7:00 AM

## 2020-01-25 ENCOUNTER — Telehealth: Payer: Medicare Other | Admitting: Internal Medicine

## 2020-02-02 NOTE — Progress Notes (Signed)
Follow-up Outpatient Visit Date: 02/03/2020  Primary Care Provider: Barbaraann Boys, MD [address] [facility] [place] [address]  Chief Complaint: Irregular heart beat  HPI:  Adam Knight is a 79 y.o. 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  We last spoke in 04/01/2019, at which time Adam Knight had stable exertional dyspnea that had improved after his pulmonary endarterectomy in 10/2018.  Right heart catheterization at [facility] in 08/2019 showed improved pulmonary artery pressures and PA pressure of 22 mmHg and a PCWP pressure of 13 mmHg.  Today, Adam Knight is most concerned about an irregular heartbeat that is indicated by his blood pressure cuff.  He notes very few palpitations but states that essentially every time he checks his blood pressure, that the monitor indicates an irregular heartbeat.  When he checks his wife blood pressure, the monitor does not indicate this.  He denies a history of prior arrhythmia.  He has chronic exertional dyspnea that might be slightly more pronounced than when we last spoke.  He has also been told by his wife that he seems to breathe harder at times, though he wonders if this is related to having to wear a mask in the setting of COVID-19 pandemic.  Adam Knight has not had any chest pain, lightheadedness, or orthopnea.  He has chronic mild leg swelling that has been present for years.  He thinks it may have gotten worse over the last few months.  He also notes some discomfort in the left groin when trying to raise the left leg.  He recalls being involved in an accident with injury to the left leg in 06/2019, though the aforementioned symptoms did not begin immediately after this.  He states that his blood pressure at home has been well  controlled.  --------------------------------------------------------------------------------------------------  Past Medical History:  Diagnosis Date  . 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[place])   . Dyslipidemia   . Edema   . Gastric ulcer   . OSA on CPAP   . Prediabetes   . Pulmonary embolism ([place])   . Seasonal allergies    Past Surgical History:  Procedure Laterality Date  . ENDARTERECTOMY  10/2018   Pulmonary endarterectomy for CTEPH - [facility]  . PULMONARY VENOGRAPHY Bilateral 01/08/2018   Procedure: PULMONARY VENOGRAPHY possible thrombolysis;  Surgeon: Katha Cabal, MD;  Location: [place] [facility];  Service: Cardiovascular;  Laterality: Bilateral;  . TONSILLECTOMY       Recent CV Pertinent Labs: Lab Results  Component Value Date   INR 2.4 01/13/2020   INR 1.7 (H) 03/13/2019   K 3.2 (L) 03/09/2019   BUN 29 (H) 03/09/2019   CREATININE 1.15 03/09/2019    Past medical and surgical history were reviewed and updated in EPIC.  Current Meds  Medication Sig  . albuterol (PROVENTIL HFA;VENTOLIN HFA) 108 (90 Base) MCG/ACT inhaler Inhale 2 puffs into the lungs every 6 (six) hours as needed for wheezing or shortness of breath.  . B Complex-C (SUPER B COMPLEX PO) Take by mouth daily.  . Calcium Carbonate Antacid (CALCIUM CARBONATE, DOSED IN MG ELEMENTAL CALCIUM,) 1250 MG/5ML SUSP Take by mouth.  . Cholecalciferol (VITAMIN D) 2000 UNITS tablet Take 2,000 Units by mouth daily.  . finasteride (PROSCAR) 5 MG tablet Take 5 mg by mouth daily.   . potassium chloride (KLOR-CON) 10 MEQ tablet Take 40 mEq by  mouth daily.   Marland Kitchen torsemide (DEMADEX) 20 MG tablet Take 2 tablets (40 mg total) by mouth daily.  Marland Kitchen warfarin (COUMADIN) 5 MG tablet Take 1 tablet daily or take as directed by the Anticoagulation Clinic.    Allergies: Sulfa antibiotics, Spironolactone, and Penicillins  Social History   Tobacco Use  . Smoking status: Former Smoker    Packs/day: 0.30     Years: 7.00    Pack years: 2.10    Types: Cigarettes    Quit date: 1967    Years since quitting: 54.1  . Smokeless tobacco: Never Used  Substance Use Topics  . Alcohol use: Not Currently  . Drug use: No    Family History  Problem Relation Age of Onset  . Stroke Mother   . Coronary artery disease Mother   . Osteoporosis Mother   . Rheum arthritis Mother   . Thyroid disease Mother     Review of Systems: A 12-system review of systems was performed and was negative except as noted in the HPI.  --------------------------------------------------------------------------------------------------  Physical Exam: BP 120/70   Pulse 92   Ht 5\' 2"  (1.575 m)   Wt 223 lb 8 oz (101.4 kg)   SpO2 97%   BMI 40.88 kg/m   General: NAD. HEENT: No conjunctival pallor or scleral icterus. Facemask in place. Neck: No JVD or HJR. Lungs: Normal work of breathing. Clear to auscultation bilaterally without wheezes or crackles. Heart: Regular rate and rhythm without murmurs, rubs, or gallops. Abd: Bowel sounds present.  Obese but soft and nontender. Ext: Trace bilateral lower extremity edema, slightly more pronounced on the left.  2+ pedal pulses bilaterally.  EKG: Normal sinus rhythm with left axis deviation and right bundle branch block.  Anterolateral T wave inversions also present.  No significant change from prior tracing on 05/01/2018.  Lab Results  Component Value Date   WBC 6.1 03/09/2019   HGB 13.8 03/09/2019   HCT 42.3 03/09/2019   MCV 82.1 03/09/2019   PLT 387 03/09/2019    Lab Results  Component Value Date   NA 131 (L) 03/09/2019   K 3.2 (L) 03/09/2019   CL 90 (L) 03/09/2019   CO2 29 03/09/2019   BUN 29 (H) 03/09/2019   CREATININE 1.15 03/09/2019   GLUCOSE 106 (H) 03/09/2019   ALT 24 03/09/2019    No results found for: CHOL, HDL, LDLCALC, LDLDIRECT, TRIG,  CHOLHDL  --------------------------------------------------------------------------------------------------  ASSESSMENT AND PLAN: CTEPH: [name] has improved significantly following pulmonary endarterectomy in 10/2018.  He reports some slight progression in exertional dyspnea but otherwise feels like he is at his baseline.  We will continue indefinite anticoagulation with warfarin.  We will also continue his current dose of torsemide 40 mg daily.  I encouraged Mr. Cronister to reach out to his pulmonary hypertension specialist at [facility] to schedule regular follow-up.  Left leg swelling: Minimal edema noted.  I wonder if he may have had a DVT in the past given his history of CTEPH with resultant post thrombotic syndrome.  I think it is reasonable to continue his current dose of torsemide.  Likelihood of recurrent DVT is low given therapeutic INR in the setting of chronic warfarin use.  Irregular heart beat: Incidentally reported by the patient's heart rate monitor, though he endorses minimal palpitations.  EKG and rhythm strip today do not show any significant arrhythmia, only chronic right bundle branch block.  We discussed utility of ambulatory event monitoring but have agreed to defer this given lack of  symptoms.  Morbid obesity: BMI greater than 40.  I have encouraged weight loss through diet and exercise.  Follow-up: Return to clinic in 6 months.  Nelva Bush, MD 02/03/2020 1:05 PM

## 2020-02-03 ENCOUNTER — Encounter: Payer: Self-pay | Admitting: Internal Medicine

## 2020-02-03 ENCOUNTER — Ambulatory Visit (INDEPENDENT_AMBULATORY_CARE_PROVIDER_SITE_OTHER): Payer: Medicare Other | Admitting: Internal Medicine

## 2020-02-03 ENCOUNTER — Other Ambulatory Visit: Payer: Self-pay

## 2020-02-03 VITALS — BP 120/70 | HR 92 | Ht 62.0 in | Wt 223.5 lb

## 2020-02-03 DIAGNOSIS — I499 Cardiac arrhythmia, unspecified: Secondary | ICD-10-CM | POA: Diagnosis not present

## 2020-02-03 DIAGNOSIS — I2724 Chronic thromboembolic pulmonary hypertension: Secondary | ICD-10-CM | POA: Diagnosis not present

## 2020-02-03 DIAGNOSIS — M7989 Other specified soft tissue disorders: Secondary | ICD-10-CM | POA: Diagnosis not present

## 2020-02-03 NOTE — Patient Instructions (Signed)
Medication Instructions:  Your physician recommends that you continue on your current medications as directed. Please refer to the Current Medication list given to you today.  *If you need a refill on your cardiac medications before your next appointment, please call your pharmacy*  Lab Work: none If you have labs (blood work) drawn today and your tests are completely normal, you will receive your results only by: Marland Kitchen MyChart Message (if you have MyChart) OR . A paper copy in the mail If you have any lab test that is abnormal or we need to change your treatment, we will call you to review the results.  Testing/Procedures: none  Follow-Up: At [facility], you and your health needs are our priority.  As part of our continuing mission to provide you with exceptional heart care, we have created designated Provider Care Teams.  These Care Teams include your primary Cardiologist (physician) and Advanced Practice Providers (APPs -  Physician Assistants and Nurse Practitioners) who all work together to provide you with the care you need, when you need it.  Your next appointment:   6 month(s)  The format for your next appointment:   In Person  Provider:    You may see Nelva Bush, MD or one of the following Advanced Practice Providers on your designated Care Team:    Murray Hodgkins, NP  Christell Faith, PA-C  Marrianne Mood, PA-C

## 2020-02-08 ENCOUNTER — Telehealth: Payer: Self-pay

## 2020-02-08 MED ORDER — WARFARIN SODIUM 5 MG PO TABS: ORAL_TABLET | ORAL | 0 refills | Status: DC

## 2020-02-08 NOTE — Telephone Encounter (Signed)
Incoming fax from [facility] requesting a day day supply for Warfarin 5MG  tablets.

## 2020-02-08 NOTE — Telephone Encounter (Signed)
Warfarin refilled as requested.

## 2020-02-17 ENCOUNTER — Ambulatory Visit (INDEPENDENT_AMBULATORY_CARE_PROVIDER_SITE_OTHER): Payer: Medicare Other

## 2020-02-17 ENCOUNTER — Other Ambulatory Visit: Payer: Self-pay

## 2020-02-17 DIAGNOSIS — Z5181 Encounter for therapeutic drug level monitoring: Secondary | ICD-10-CM

## 2020-02-17 DIAGNOSIS — I2602 Saddle embolus of pulmonary artery with acute cor pulmonale: Secondary | ICD-10-CM | POA: Diagnosis not present

## 2020-02-17 LAB — POCT INR: INR: 2.5 (ref 2.0–3.0)

## 2020-02-17 NOTE — Patient Instructions (Signed)
-  Continue warfarin dosage of 1 tablet (5 mg) every day. - Recheck in 6 weeks.

## 2020-03-23 ENCOUNTER — Other Ambulatory Visit: Payer: Self-pay

## 2020-03-23 ENCOUNTER — Ambulatory Visit (INDEPENDENT_AMBULATORY_CARE_PROVIDER_SITE_OTHER): Payer: Medicare Other

## 2020-03-23 DIAGNOSIS — Z5181 Encounter for therapeutic drug level monitoring: Secondary | ICD-10-CM

## 2020-03-23 DIAGNOSIS — I2602 Saddle embolus of pulmonary artery with acute cor pulmonale: Secondary | ICD-10-CM | POA: Diagnosis not present

## 2020-03-23 LAB — POCT INR: INR: 2.4 (ref 2.0–3.0)

## 2020-03-23 NOTE — Patient Instructions (Signed)
-  Continue warfarin dosage of 1 tablet (5 mg) every day. - Recheck in 6 weeks.

## 2020-03-28 ENCOUNTER — Other Ambulatory Visit: Payer: Self-pay

## 2020-03-28 ENCOUNTER — Emergency Department
Admission: EM | Admit: 2020-03-28 | Discharge: 2020-03-28 | Disposition: A | Discharge status: Home / Self Care | Payer: Medicare Other | Attending: Emergency Medicine | Admitting: Emergency Medicine

## 2020-03-28 ENCOUNTER — Emergency Department: Payer: Medicare Other

## 2020-03-28 DIAGNOSIS — M25511 Pain in right shoulder: Secondary | ICD-10-CM | POA: Diagnosis not present

## 2020-03-28 DIAGNOSIS — R0602 Shortness of breath: Secondary | ICD-10-CM | POA: Diagnosis not present

## 2020-03-28 DIAGNOSIS — Z5321 Procedure and treatment not carried out due to patient leaving prior to being seen by health care provider: Secondary | ICD-10-CM | POA: Diagnosis not present

## 2020-03-28 LAB — BASIC METABOLIC PANEL
Anion gap: 12 (ref 5–15)
BUN: 29 mg/dL — ABNORMAL HIGH (ref 8–23)
CO2: 26 mmol/L (ref 22–32)
Calcium: 8.5 mg/dL — ABNORMAL LOW (ref 8.9–10.3)
Chloride: 99 mmol/L (ref 98–111)
Creatinine, Ser: 1.36 mg/dL — ABNORMAL HIGH (ref 0.61–1.24)
GFR calc Af Amer: 57 mL/min — ABNORMAL LOW (ref >60)
GFR calc non Af Amer: 49 mL/min — ABNORMAL LOW (ref >60)
Glucose, Bld: 115 mg/dL — ABNORMAL HIGH (ref 70–99)
Potassium: 3.1 mmol/L — ABNORMAL LOW (ref 3.5–5.1)
Sodium: 137 mmol/L (ref 135–145)

## 2020-03-28 LAB — CBC
HCT: 44.2 % (ref 39.0–52.0)
Hemoglobin: 14.8 g/dL (ref 13.0–17.0)
MCH: 28.8 pg (ref 26.0–34.0)
MCHC: 33.5 g/dL (ref 30.0–36.0)
MCV: 86 fL (ref 80.0–100.0)
Platelets: 411 10*3/uL — ABNORMAL HIGH (ref 150–400)
RBC: 5.14 MIL/uL (ref 4.22–5.81)
RDW: 13.7 % (ref 11.5–15.5)
WBC: 7.8 10*3/uL (ref 4.0–10.5)
nRBC: 0 % (ref 0.0–0.2)

## 2020-03-28 LAB — TROPONIN I (HIGH SENSITIVITY): Troponin I (High Sensitivity): 6 ng/L (ref <18)

## 2020-03-28 MED ORDER — SODIUM CHLORIDE 0.9% FLUSH: 3.0000 mL | Freq: Once | INTRAVENOUS | Status: DC

## 2020-03-28 NOTE — ED Triage Notes (Addendum)
Pt comes via POV from home with c/o of SOB. Pt states this started yesterday and has gotten worse today.  Pt states he wears a CPAP at night and that he noticed he has been grasping for air.  Pt denies any chest pain. Pt denies any N/V/D.  Pt states some right shoulder pain

## 2020-04-13 ENCOUNTER — Other Ambulatory Visit: Payer: Self-pay | Admitting: Internal Medicine

## 2020-04-13 NOTE — Telephone Encounter (Signed)
Please review for refill. Thanks!  

## 2020-04-18 ENCOUNTER — Ambulatory Visit (INDEPENDENT_AMBULATORY_CARE_PROVIDER_SITE_OTHER): Payer: Medicare Other | Admitting: Dermatology

## 2020-04-18 ENCOUNTER — Other Ambulatory Visit: Payer: Self-pay

## 2020-04-18 DIAGNOSIS — C4491 Basal cell carcinoma of skin, unspecified: Secondary | ICD-10-CM

## 2020-04-18 DIAGNOSIS — L821 Other seborrheic keratosis: Secondary | ICD-10-CM

## 2020-04-18 DIAGNOSIS — L814 Other melanin hyperpigmentation: Secondary | ICD-10-CM | POA: Diagnosis not present

## 2020-04-18 DIAGNOSIS — D1800 Hemangioma unspecified site: Secondary | ICD-10-CM

## 2020-04-18 DIAGNOSIS — Z872 Personal history of diseases of the skin and subcutaneous tissue: Secondary | ICD-10-CM | POA: Diagnosis not present

## 2020-04-18 DIAGNOSIS — D692 Other nonthrombocytopenic purpura: Secondary | ICD-10-CM

## 2020-04-18 DIAGNOSIS — C44319 Basal cell carcinoma of skin of other parts of face: Secondary | ICD-10-CM | POA: Diagnosis not present

## 2020-04-18 DIAGNOSIS — L570 Actinic keratosis: Secondary | ICD-10-CM

## 2020-04-18 DIAGNOSIS — D492 Neoplasm of unspecified behavior of bone, soft tissue, and skin: Secondary | ICD-10-CM

## 2020-04-18 DIAGNOSIS — L820 Inflamed seborrheic keratosis: Secondary | ICD-10-CM | POA: Diagnosis not present

## 2020-04-18 DIAGNOSIS — Z1283 Encounter for screening for malignant neoplasm of skin: Secondary | ICD-10-CM | POA: Diagnosis not present

## 2020-04-18 DIAGNOSIS — D485 Neoplasm of uncertain behavior of skin: Secondary | ICD-10-CM

## 2020-04-18 DIAGNOSIS — D229 Melanocytic nevi, unspecified: Secondary | ICD-10-CM

## 2020-04-18 HISTORY — DX: Melanocytic nevi, unspecified: D22.9

## 2020-04-18 HISTORY — DX: Basal cell carcinoma of skin, unspecified: C44.91

## 2020-04-18 NOTE — Patient Instructions (Signed)

## 2020-04-18 NOTE — Progress Notes (Addendum)
Follow-Up Visit   Subjective  Adam Knight is a 79 y.o. male who presents for the following: Annual Exam (TBSE, hx AKs).  Has some new pink scaly spots on L arm.   The following portions of the chart were reviewed this encounter and updated as appropriate:      Review of Systems:  No other skin or systemic complaints except as noted in HPI or Assessment and Plan.  Objective  Well appearing patient in no apparent distress; mood and affect are within normal limits.  All skin waist up examined.  Objective  L upper ear helix x 1, R upper ear helix x 1, R upper forehead x 1, L upper forehead x 1 (4): Pink scaly macules   Objective  Left Forearm x 3, L post shoulder x 1 (4): Erythematous keratotic or waxy stuck-on papule or plaque.   Objective  R mid upper forehead: 4.38mm pearly pink pap     Objective  R temple: 10.0 x 5.49mm speckled brown macule      Assessment & Plan   Skin cancer screening performed today.  AK (actinic keratosis) (4) L upper ear helix x 1, R upper ear helix x 1, R upper forehead x 1, L upper forehead x 1  Destruction of lesion - L upper ear helix x 1, R upper ear helix x 1, R upper forehead x 1, L upper forehead x 1  Destruction method: cryotherapy   Informed consent: discussed and consent obtained   Lesion destroyed using liquid nitrogen: Yes   Region frozen until ice ball extended beyond lesion: Yes   Outcome: patient tolerated procedure well with no complications   Post-procedure details: wound care instructions given    Inflamed seborrheic keratosis (4) Left Forearm x 3, L post shoulder x 1  Destruction of lesion - Left Forearm x 3, L post shoulder x 1  Destruction method: cryotherapy   Informed consent: discussed and consent obtained   Lesion destroyed using liquid nitrogen: Yes   Region frozen until ice ball extended beyond lesion: Yes   Outcome: patient tolerated procedure well with no complications   Post-procedure  details: wound care instructions given    Neoplasm of skin (2) R mid upper forehead  Skin / nail biopsy Type of biopsy: tangential   Informed consent: discussed and consent obtained   Anesthesia: the lesion was anesthetized in a standard fashion   Anesthesia comment:  Area prepped with alcohol Anesthetic:  1% lidocaine w/ epinephrine 1-100,000 buffered w/ 8.4% NaHCO3 Hemostasis achieved with: pressure, aluminum chloride and electrodesiccation   Outcome: patient tolerated procedure well   Post-procedure details: wound care instructions given   Post-procedure details comment:  Ointment and small bandage applied  Specimen 1 - Surgical pathology Differential Diagnosis: Nevus vs SK r/o BCC Check Margins: No 4.4mm pearly pink pap  R temple  Skin / nail biopsy Type of biopsy: tangential   Informed consent: discussed and consent obtained   Anesthesia: the lesion was anesthetized in a standard fashion   Anesthesia comment:  Area prepped with alcohol Anesthetic:  1% lidocaine w/ epinephrine 1-100,000 buffered w/ 8.4% NaHCO3 Hemostasis achieved with: pressure, aluminum chloride and electrodesiccation   Outcome: patient tolerated procedure well   Post-procedure details: wound care instructions given   Post-procedure details comment:  Ointment and small bandage applied  Specimen 2 - Surgical pathology Differential Diagnosis: Lentigo vs SK r/o Atypia Check Margins: No 10.0 x 5.16mm speckled brown macule  Hemangiomas - Red papules - Discussed benign  nature - Observe - Call for any changes  Lentigines - Scattered tan macules - Discussed due to sun exposure - Benign, observe - Call for any changes  Seborrheic Keratoses - Stuck-on, waxy, tan-brown papules and plaques  - Discussed benign etiology and prognosis. - Observe - Call for any changes  Actinic Damage - diffuse scaly erythematous macules with underlying dyspigmentation - Recommend daily broad spectrum sunscreen SPF 30+  to sun-exposed areas, reapply every 2 hours as needed.  - Call for new or changing lesions.  Purpura - Violaceous macules and patches - Benign - Related to age, sun damage and/or use of blood thinners - Observe - Can use OTC arnica containing moisturizer such as Dermend Bruise Formula if desired - Call for worsening or other concerns   Return in about 6 months (around 10/18/2020) for AK, ISK.    Documentation: I have reviewed the above documentation for accuracy and completeness, and I agree with the above.  Brendolyn Patty, MD   I, Othelia Pulling, RMA, am acting as scribe for Brendolyn Patty, MD .

## 2020-04-21 ENCOUNTER — Telehealth: Payer: Self-pay

## 2020-04-21 NOTE — Telephone Encounter (Signed)
-----   Message from Brendolyn Patty, MD sent at 04/20/2020 10:07 PM EDT ----- 1. 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

## 2020-04-21 NOTE — Telephone Encounter (Signed)
Lft pt msg to call for bx results/sh °

## 2020-04-25 NOTE — Telephone Encounter (Signed)
Patient advised of biopsy results. He has been scheduled for a surgery June 2nd. Patient would like to do an [facility] for his forehead. Advised patient this can be done at his follow up from his surgery appointment.

## 2020-05-04 ENCOUNTER — Other Ambulatory Visit: Payer: Self-pay

## 2020-05-04 ENCOUNTER — Ambulatory Visit (INDEPENDENT_AMBULATORY_CARE_PROVIDER_SITE_OTHER): Payer: Medicare Other

## 2020-05-04 DIAGNOSIS — Z5181 Encounter for therapeutic drug level monitoring: Secondary | ICD-10-CM | POA: Diagnosis not present

## 2020-05-04 DIAGNOSIS — I2602 Saddle embolus of pulmonary artery with acute cor pulmonale: Secondary | ICD-10-CM | POA: Diagnosis not present

## 2020-05-04 LAB — POCT INR: INR: 1.5 — AB (ref 2.0–3.0)

## 2020-05-04 NOTE — Patient Instructions (Signed)
-   take 2 warfarin tablets tonight - Continue warfarin dosage of 1 tablet (5 mg) every day. - Recheck in 6 weeks.

## 2020-05-12 ENCOUNTER — Encounter: Payer: Medicare Other | Attending: Internal Medicine

## 2020-05-12 ENCOUNTER — Other Ambulatory Visit: Payer: Self-pay

## 2020-05-12 DIAGNOSIS — E785 Hyperlipidemia, unspecified: Secondary | ICD-10-CM | POA: Insufficient documentation

## 2020-05-12 DIAGNOSIS — I2724 Chronic thromboembolic pulmonary hypertension: Secondary | ICD-10-CM | POA: Insufficient documentation

## 2020-05-12 DIAGNOSIS — Z7901 Long term (current) use of anticoagulants: Secondary | ICD-10-CM | POA: Insufficient documentation

## 2020-05-12 DIAGNOSIS — R7303 Prediabetes: Secondary | ICD-10-CM | POA: Insufficient documentation

## 2020-05-12 DIAGNOSIS — Z9989 Dependence on other enabling machines and devices: Secondary | ICD-10-CM | POA: Insufficient documentation

## 2020-05-12 DIAGNOSIS — I272 Pulmonary hypertension, unspecified: Secondary | ICD-10-CM

## 2020-05-12 DIAGNOSIS — Z79899 Other long term (current) drug therapy: Secondary | ICD-10-CM | POA: Insufficient documentation

## 2020-05-12 DIAGNOSIS — Z87891 Personal history of nicotine dependence: Secondary | ICD-10-CM | POA: Insufficient documentation

## 2020-05-12 DIAGNOSIS — I2699 Other pulmonary embolism without acute cor pulmonale: Secondary | ICD-10-CM | POA: Insufficient documentation

## 2020-05-12 DIAGNOSIS — G4733 Obstructive sleep apnea (adult) (pediatric): Secondary | ICD-10-CM | POA: Insufficient documentation

## 2020-05-12 NOTE — Progress Notes (Signed)
Virtual Visit completed. Patient informed on EP and RD appointment and 6 Minute walk test. Patient also informed of patient health questionnaires on My Chart. Patient Verbalizes understanding. Visit diagnosis can be found in [facility] 04/29/2020.

## 2020-05-16 ENCOUNTER — Encounter: Payer: Medicare Other | Admitting: *Deleted

## 2020-05-16 ENCOUNTER — Other Ambulatory Visit: Payer: Self-pay

## 2020-05-16 VITALS — Ht 63.5 in | Wt 225.7 lb

## 2020-05-16 DIAGNOSIS — Z9989 Dependence on other enabling machines and devices: Secondary | ICD-10-CM | POA: Diagnosis not present

## 2020-05-16 DIAGNOSIS — Z7901 Long term (current) use of anticoagulants: Secondary | ICD-10-CM | POA: Diagnosis not present

## 2020-05-16 DIAGNOSIS — I272 Pulmonary hypertension, unspecified: Secondary | ICD-10-CM

## 2020-05-16 DIAGNOSIS — R7303 Prediabetes: Secondary | ICD-10-CM | POA: Diagnosis not present

## 2020-05-16 DIAGNOSIS — I2724 Chronic thromboembolic pulmonary hypertension: Secondary | ICD-10-CM | POA: Diagnosis not present

## 2020-05-16 DIAGNOSIS — G4733 Obstructive sleep apnea (adult) (pediatric): Secondary | ICD-10-CM | POA: Diagnosis not present

## 2020-05-16 DIAGNOSIS — Z87891 Personal history of nicotine dependence: Secondary | ICD-10-CM | POA: Diagnosis not present

## 2020-05-16 DIAGNOSIS — Z79899 Other long term (current) drug therapy: Secondary | ICD-10-CM | POA: Diagnosis not present

## 2020-05-16 DIAGNOSIS — E785 Hyperlipidemia, unspecified: Secondary | ICD-10-CM | POA: Diagnosis not present

## 2020-05-16 DIAGNOSIS — I2699 Other pulmonary embolism without acute cor pulmonale: Secondary | ICD-10-CM | POA: Diagnosis not present

## 2020-05-16 DIAGNOSIS — R0609 Other forms of dyspnea: Secondary | ICD-10-CM

## 2020-05-16 NOTE — Patient Instructions (Signed)
Patient Instructions  Patient Details  Name: Adam Knight MRN: [id number] Date of Birth: 1941-10-08 Referring Provider:  French Ana, MD  Below are your personal goals for exercise, nutrition, and risk factors. Our goal is to help you stay on track towards obtaining and maintaining these goals. We will be discussing your progress on these goals with you throughout the program.  Initial Exercise Prescription: Initial Exercise Prescription - 05/16/20 0900      Date of Initial Exercise RX and Referring Provider   Date  05/16/20    Referring Provider  [name] MD      Treadmill   MPH  1.2    Grade  0.5    Minutes  15    METs  2      Recumbant Bike   Level  1    RPM  50    Watts  11    Minutes  15    METs  2      NuStep   Level  1    SPM  80    Minutes  15    METs  2      T5 Nustep   Level  1    SPM  80    Minutes  15    METs  2      Prescription Details   Frequency (times per week)  2    Duration  Progress to 30 minutes of continuous aerobic without signs/symptoms of physical distress      Intensity   THRR 40-80% of Max Heartrate  104-129    Ratings of Perceived Exertion  11-13    Perceived Dyspnea  0-4      Progression   Progression  Continue to progress workloads to maintain intensity without signs/symptoms of physical distress.      Resistance Training   Training Prescription  Yes    Weight  3 lb    Reps  10-15       Exercise Goals: Frequency: Be able to perform aerobic exercise two to three times per week in program working toward 2-5 days per week of home exercise.  Intensity: Work with a perceived exertion of 11 (fairly light) - 15 (hard) while following your exercise prescription.  We will make changes to your prescription with you as you progress through the program.   Duration: Be able to do 30 to 45 minutes of continuous aerobic exercise in addition to a 5 minute warm-up and a 5 minute cool-down routine.   Nutrition Goals: Your  personal nutrition goals will be established when you do your nutrition analysis with the dietician.  The following are general nutrition guidelines to follow: Cholesterol < 200mg /day Sodium < 1500mg /day Fiber: Men over 50 yrs - 30 grams per day  Personal Goals: Personal Goals and Risk Factors at Admission - 05/16/20 1003      Core Components/Risk Factors/Patient Goals on Admission    Weight Management  Yes;Weight Loss    Intervention  Weight Management: Develop a combined nutrition and exercise program designed to reach desired caloric intake, while maintaining appropriate intake of nutrient and fiber, sodium and fats, and appropriate energy expenditure required for the weight goal.;Weight Management: Provide education and appropriate resources to help participant work on and attain dietary goals.;Weight Management/Obesity: Establish reasonable short term and long term weight goals.    Admit Weight  225 lb 11.2 oz (102.4 kg)    Goal Weight: Short Term  220 lb (99.8 kg)  Goal Weight: Long Term  215 lb (97.5 kg)    Expected Outcomes  Short Term: Continue to assess and modify interventions until short term weight is achieved;Long Term: Adherence to nutrition and physical activity/exercise program aimed toward attainment of established weight goal;Weight Loss: Understanding of general recommendations for a balanced deficit meal plan, which promotes 1-2 lb weight loss per week and includes a negative energy balance of [phone] kcal/d;Understanding recommendations for meals to include 15-35% energy as protein, 25-35% energy from fat, 35-60% energy from carbohydrates, less than 200mg  of dietary cholesterol, 20-35 gm of total fiber daily;Understanding of distribution of calorie intake throughout the day with the consumption of 4-5 meals/snacks    Improve shortness of breath with ADL's  Yes    Intervention  Provide education, individualized exercise plan and daily activity instruction to help decrease  symptoms of SOB with activities of daily living.    Expected Outcomes  Short Term: Improve cardiorespiratory fitness to achieve a reduction of symptoms when performing ADLs;Long Term: Be able to perform more ADLs without symptoms or delay the onset of symptoms    Heart Failure  --    Lipids  Yes    Intervention  Provide education and support for participant on nutrition & aerobic/resistive exercise along with prescribed medications to achieve LDL 70mg , HDL >40mg .    Expected Outcomes  Short Term: Participant states understanding of desired cholesterol values and is compliant with medications prescribed. Participant is following exercise prescription and nutrition guidelines.;Long Term: Cholesterol controlled with medications as prescribed, with individualized exercise RX and with personalized nutrition plan. Value goals: LDL < 70mg , HDL > 40 mg.       Tobacco Use Initial Evaluation: Social History   Tobacco Use  Smoking Status Former Smoker  . Packs/day: 0.30  . Years: 7.00  . Pack years: 2.10  . Types: Cigarettes  . Quit date: 49  . Years since quitting: 54.4  Smokeless Tobacco Never Used    Exercise Goals and Review: Exercise Goals    Row Name 05/16/20 0956             Exercise Goals   Increase Physical Activity  Yes       Intervention  Provide advice, education, support and counseling about physical activity/exercise needs.;Develop an individualized exercise prescription for aerobic and resistive training based on initial evaluation findings, risk stratification, comorbidities and participant's personal goals.       Expected Outcomes  Long Term: Add in home exercise to make exercise part of routine and to increase amount of physical activity.;Short Term: Attend rehab on a regular basis to increase amount of physical activity.;Long Term: Exercising regularly at least 3-5 days a week.       Increase Strength and Stamina  Yes       Intervention  Provide advice, education,  support and counseling about physical activity/exercise needs.;Develop an individualized exercise prescription for aerobic and resistive training based on initial evaluation findings, risk stratification, comorbidities and participant's personal goals.       Expected Outcomes  Short Term: Increase workloads from initial exercise prescription for resistance, speed, and METs.;Short Term: Perform resistance training exercises routinely during rehab and add in resistance training at home;Long Term: Improve cardiorespiratory fitness, muscular endurance and strength as measured by increased METs and functional capacity (6MWT)       Able to understand and use rate of perceived exertion (RPE) scale  Yes       Intervention  Provide education and explanation on how to  use RPE scale       Expected Outcomes  Short Term: Able to use RPE daily in rehab to express subjective intensity level;Long Term:  Able to use RPE to guide intensity level when exercising independently       Able to understand and use Dyspnea scale  Yes       Intervention  Provide education and explanation on how to use Dyspnea scale       Expected Outcomes  Long Term: Able to use Dyspnea scale to guide intensity level when exercising independently;Short Term: Able to use Dyspnea scale daily in rehab to express subjective sense of shortness of breath during exertion       Knowledge and understanding of Target Heart Rate Range (THRR)  Yes       Intervention  Provide education and explanation of THRR including how the numbers were predicted and where they are located for reference       Expected Outcomes  Short Term: Able to state/look up THRR;Short Term: Able to use daily as guideline for intensity in rehab;Long Term: Able to use THRR to govern intensity when exercising independently       Able to check pulse independently  Yes       Intervention  Provide education and demonstration on how to check pulse in carotid and radial arteries.;Review the  importance of being able to check your own pulse for safety during independent exercise       Expected Outcomes  Short Term: Able to explain why pulse checking is important during independent exercise;Long Term: Able to check pulse independently and accurately       Understanding of Exercise Prescription  Yes       Intervention  Provide education, explanation, and written materials on patient's individual exercise prescription       Expected Outcomes  Short Term: Able to explain program exercise prescription;Long Term: Able to explain home exercise prescription to exercise independently          Copy of goals given to participant.

## 2020-05-16 NOTE — Progress Notes (Signed)
Pulmonary Individual Treatment Plan  Patient Details  Name: Adam Knight MRN: [id number] Date of Birth: 1941-09-07 Referring Provider:     Pulmonary Rehab from 05/16/2020 in [facility] Cardiac and Pulmonary Rehab  Referring Provider  [name] MD      Initial Encounter Date:    Pulmonary Rehab from 05/16/2020 in [facility] Cardiac and Pulmonary Rehab  Date  05/16/20      Visit Diagnosis: Pulmonary hypertension ([place])  Dyspnea on exertion  Patient's Home Medications on Admission:  Current Outpatient Medications:  .  albuterol (PROVENTIL HFA;VENTOLIN HFA) 108 (90 Base) MCG/ACT inhaler, Inhale 2 puffs into the lungs every 6 (six) hours as needed for wheezing or shortness of breath. (Patient not taking: Reported on 05/12/2020), Disp: 1 Inhaler, Rfl: 2 .  B Complex-C (SUPER B COMPLEX PO), Take by mouth daily., Disp: , Rfl:  .  busPIRone (BUSPAR) 5 MG tablet, Take 5 mg by mouth 2 (two) times daily., Disp: , Rfl:  .  Calcium Carbonate Antacid (CALCIUM CARBONATE, DOSED IN MG ELEMENTAL CALCIUM,) 1250 MG/5ML SUSP, Take by mouth., Disp: , Rfl:  .  Cholecalciferol (VITAMIN D) 2000 UNITS tablet, Take 2,000 Units by mouth daily., Disp: , Rfl:  .  finasteride (PROSCAR) 5 MG tablet, Take 5 mg by mouth daily. , Disp: , Rfl:  .  potassium chloride (KLOR-CON) 10 MEQ tablet, Take 40 mEq by mouth daily. , Disp: , Rfl:  .  torsemide (DEMADEX) 20 MG tablet, Take 2 tablets (40 mg total) by mouth daily., Disp: 180 tablet, Rfl: 1 .  warfarin (COUMADIN) 5 MG tablet, TAKE 1 TABLET DAILY OR TAKE AS DIRECTED BY THE ANTICOAGULATION CLINIC., Disp: 90 tablet, Rfl: 0  Past Medical History: Past Medical History:  Diagnosis Date  . Actinic keratosis   . 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 Dyslipidemia   . Edema   . Gastric ulcer   . OSA on CPAP   . Prediabetes   . Pulmonary embolism ([place])   . Seasonal allergies     Tobacco Use: Social History   Tobacco Use  Smoking Status Former  Smoker  . Packs/day: 0.30  . Years: 7.00  . Pack years: 2.10  . Types: Cigarettes  . Quit date: 32  . Years since quitting: 54.4  Smokeless Tobacco Never Used    Labs: Recent Review Flowsheet Data    There is no flowsheet data to display.       Pulmonary Assessment Scores: Pulmonary Assessment Scores    Row Name 05/16/20 1005         ADL UCSD   ADL Phase  Entry     SOB Score total  28     Rest  0     Walk  1     Stairs  3     Bath  0     Dress  1     Shop  1       CAT Score   CAT Score  11       mMRC Score   mMRC Score  2        UCSD: Self-administered rating of dyspnea associated with activities of daily living (ADLs) 6-point scale (0 = "not at all" to 5 = "maximal or unable to do because of breathlessness")  Scoring Scores range from 0 to 120.  Minimally important difference is 5 units  CAT: CAT can identify the health impairment of COPD patients and is better correlated with disease progression.  CAT  has a scoring range of zero to 40. The CAT score is classified into four groups of low (less than 10), medium (10 - 20), high (21-30) and very high (31-40) based on the impact level of disease on health status. A CAT score over 10 suggests significant symptoms.  A worsening CAT score could be explained by an exacerbation, poor medication adherence, poor inhaler technique, or progression of COPD or comorbid conditions.  CAT MCID is 2 points  mMRC: mMRC (Modified Medical Research Council) Dyspnea Scale is used to assess the degree of baseline functional disability in patients of respiratory disease due to dyspnea. No minimal important difference is established. A decrease in score of 1 point or greater is considered a positive change.   Pulmonary Function Assessment: Pulmonary Function Assessment - 05/12/20 0939      Breath   Shortness of Breath  Yes;Limiting activity       Exercise Target Goals: Exercise Program Goal: Individual exercise prescription  set using results from initial 6 min walk test and THRR while considering  patient's activity barriers and safety.   Exercise Prescription Goal: Initial exercise prescription builds to 30-45 minutes a day of aerobic activity, 2-3 days per week.  Home exercise guidelines will be given to patient during program as part of exercise prescription that the participant will acknowledge.  Education: Aerobic Exercise & Resistance Training: - Gives group verbal and written instruction on the various components of exercise. Focuses on aerobic and resistive training programs and the benefits of this training and how to safely progress through these programs..   Education: Exercise & Equipment Safety: - Individual verbal instruction and demonstration of equipment use and safety with use of the equipment.   Pulmonary Rehab from 05/16/2020 in [facility] Cardiac and Pulmonary Rehab  Date  05/12/20  Educator  [facility]  Instruction Review Code  1- Verbalizes Understanding      Education: Exercise Physiology & General Exercise Guidelines: - Group verbal and written instruction with models to review the exercise physiology of the cardiovascular system and associated critical values. Provides general exercise guidelines with specific guidelines to those with heart or lung disease.    Education: Flexibility, Balance, Mind/Body Relaxation: Provides group verbal/written instruction on the benefits of flexibility and balance training, including mind/body exercise modes such as yoga, pilates and tai chi.  Demonstration and skill practice provided.   Activity Barriers & Risk Stratification: Activity Barriers & Cardiac Risk Stratification - 05/16/20 0954      Activity Barriers & Cardiac Risk Stratification   Activity Barriers  Deconditioning;Arthritis;Muscular Weakness;Shortness of Breath;History of Falls;Balance Concerns;Joint Problems       6 Minute Walk: 6 Minute Walk    Row Name 05/16/20 0950         6 Minute  Walk   Phase  Initial     Distance  860 feet     Walk Time  6 minutes     # of Rest Breaks  0     MPH  1.63     METS  1.15     RPE  13     Perceived Dyspnea   3     VO2 Peak  4.02     Symptoms  Yes (comment)     Comments  SOB     Resting HR  79 bpm     Resting BP  124/66     Resting Oxygen Saturation   95 %     Exercise Oxygen Saturation  during 6 min walk  92 %     Max Ex. HR  110 bpm     Max Ex. BP  136/70     2 Minute Post BP  124/68       Interval HR   1 Minute HR  98     2 Minute HR  100     3 Minute HR  108     4 Minute HR  107     5 Minute HR  109     6 Minute HR  110     2 Minute Post HR  94     Interval Heart Rate?  Yes       Interval Oxygen   Interval Oxygen?  Yes     Baseline Oxygen Saturation %  95 %     1 Minute Oxygen Saturation %  93 %     1 Minute Liters of Oxygen  0 L Room Air     2 Minute Oxygen Saturation %  92 %     2 Minute Liters of Oxygen  0 L     3 Minute Oxygen Saturation %  93 %     3 Minute Liters of Oxygen  0 L     4 Minute Oxygen Saturation %  94 %     4 Minute Liters of Oxygen  0 L     5 Minute Oxygen Saturation %  94 %     5 Minute Liters of Oxygen  0 L     6 Minute Oxygen Saturation %  95 %     6 Minute Liters of Oxygen  0 L     2 Minute Post Oxygen Saturation %  96 %     2 Minute Post Liters of Oxygen  0 L       Oxygen Initial Assessment: Oxygen Initial Assessment - 05/12/20 0939      Home Oxygen   Home Oxygen Device  None    Sleep Oxygen Prescription  CPAP    Liters per minute  0    Home Exercise Oxygen Prescription  None    Home at Rest Exercise Oxygen Prescription  None    Compliance with Home Oxygen Use  Yes      Initial 6 min Walk   Oxygen Used  None      Program Oxygen Prescription   Program Oxygen Prescription  None      Intervention   Short Term Goals  To learn and exhibit compliance with exercise, home and travel O2 prescription;To learn and understand importance of monitoring SPO2 with pulse oximeter and  demonstrate accurate use of the pulse oximeter.;To learn and understand importance of maintaining oxygen saturations>88%;To learn and demonstrate proper pursed lip breathing techniques or other breathing techniques.;To learn and demonstrate proper use of respiratory medications    Long  Term Goals  Exhibits compliance with exercise, home and travel O2 prescription;Verbalizes importance of monitoring SPO2 with pulse oximeter and return demonstration;Maintenance of O2 saturations>88%;Exhibits proper breathing techniques, such as pursed lip breathing or other method taught during program session;Compliance with respiratory medication;Demonstrates proper use of MDI's       Oxygen Re-Evaluation:   Oxygen Discharge (Final Oxygen Re-Evaluation):   Initial Exercise Prescription: Initial Exercise Prescription - 05/16/20 0900      Date of Initial Exercise RX and Referring Provider   Date  05/16/20    Referring Provider  [name] MD      Treadmill   MPH  1.2  Grade  0.5    Minutes  15    METs  2      Recumbant Bike   Level  1    RPM  50    Watts  11    Minutes  15    METs  2      NuStep   Level  1    SPM  80    Minutes  15    METs  2      T5 Nustep   Level  1    SPM  80    Minutes  15    METs  2      Prescription Details   Frequency (times per week)  2    Duration  Progress to 30 minutes of continuous aerobic without signs/symptoms of physical distress      Intensity   THRR 40-80% of Max Heartrate  104-129    Ratings of Perceived Exertion  11-13    Perceived Dyspnea  0-4      Progression   Progression  Continue to progress workloads to maintain intensity without signs/symptoms of physical distress.      Resistance Training   Training Prescription  Yes    Weight  3 lb    Reps  10-15       Perform Capillary Blood Glucose checks as needed.  Exercise Prescription Changes: Exercise Prescription Changes    Row Name 05/16/20 0900             Response to  Exercise   Blood Pressure (Admit)  124/66       Blood Pressure (Exercise)  136/70       Blood Pressure (Exit)  124/68       Heart Rate (Admit)  79 bpm       Heart Rate (Exercise)  110 bpm       Heart Rate (Exit)  93 bpm       Oxygen Saturation (Admit)  95 %       Oxygen Saturation (Exercise)  92 %       Oxygen Saturation (Exit)  95 %       Rating of Perceived Exertion (Exercise)  13       Perceived Dyspnea (Exercise)  3       Symptoms  SOB       Comments  walk test results          Exercise Comments:   Exercise Goals and Review: Exercise Goals    Row Name 05/16/20 0956             Exercise Goals   Increase Physical Activity  Yes       Intervention  Provide advice, education, support and counseling about physical activity/exercise needs.;Develop an individualized exercise prescription for aerobic and resistive training based on initial evaluation findings, risk stratification, comorbidities and participant's personal goals.       Expected Outcomes  Long Term: Add in home exercise to make exercise part of routine and to increase amount of physical activity.;Short Term: Attend rehab on a regular basis to increase amount of physical activity.;Long Term: Exercising regularly at least 3-5 days a week.       Increase Strength and Stamina  Yes       Intervention  Provide advice, education, support and counseling about physical activity/exercise needs.;Develop an individualized exercise prescription for aerobic and resistive training based on initial evaluation findings, risk stratification, comorbidities and participant's personal goals.  Expected Outcomes  Short Term: Increase workloads from initial exercise prescription for resistance, speed, and METs.;Short Term: Perform resistance training exercises routinely during rehab and add in resistance training at home;Long Term: Improve cardiorespiratory fitness, muscular endurance and strength as measured by increased METs and functional  capacity (6MWT)       Able to understand and use rate of perceived exertion (RPE) scale  Yes       Intervention  Provide education and explanation on how to use RPE scale       Expected Outcomes  Short Term: Able to use RPE daily in rehab to express subjective intensity level;Long Term:  Able to use RPE to guide intensity level when exercising independently       Able to understand and use Dyspnea scale  Yes       Intervention  Provide education and explanation on how to use Dyspnea scale       Expected Outcomes  Long Term: Able to use Dyspnea scale to guide intensity level when exercising independently;Short Term: Able to use Dyspnea scale daily in rehab to express subjective sense of shortness of breath during exertion       Knowledge and understanding of Target Heart Rate Range (THRR)  Yes       Intervention  Provide education and explanation of THRR including how the numbers were predicted and where they are located for reference       Expected Outcomes  Short Term: Able to state/look up THRR;Short Term: Able to use daily as guideline for intensity in rehab;Long Term: Able to use THRR to govern intensity when exercising independently       Able to check pulse independently  Yes       Intervention  Provide education and demonstration on how to check pulse in carotid and radial arteries.;Review the importance of being able to check your own pulse for safety during independent exercise       Expected Outcomes  Short Term: Able to explain why pulse checking is important during independent exercise;Long Term: Able to check pulse independently and accurately       Understanding of Exercise Prescription  Yes       Intervention  Provide education, explanation, and written materials on patient's individual exercise prescription       Expected Outcomes  Short Term: Able to explain program exercise prescription;Long Term: Able to explain home exercise prescription to exercise independently           Exercise Goals Re-Evaluation :   Discharge Exercise Prescription (Final Exercise Prescription Changes): Exercise Prescription Changes - 05/16/20 0900      Response to Exercise   Blood Pressure (Admit)  124/66    Blood Pressure (Exercise)  136/70    Blood Pressure (Exit)  124/68    Heart Rate (Admit)  79 bpm    Heart Rate (Exercise)  110 bpm    Heart Rate (Exit)  93 bpm    Oxygen Saturation (Admit)  95 %    Oxygen Saturation (Exercise)  92 %    Oxygen Saturation (Exit)  95 %    Rating of Perceived Exertion (Exercise)  13    Perceived Dyspnea (Exercise)  3    Symptoms  SOB    Comments  walk test results       Nutrition:  Target Goals: Understanding of nutrition guidelines, daily intake of sodium '1500mg'$ , cholesterol '200mg'$ , calories 30% from fat and 7% or less from saturated fats, daily to have 5 or  more servings of fruits and vegetables.  Education: Controlling Sodium/Reading Food Labels -Group verbal and written material supporting the discussion of sodium use in heart healthy nutrition. Review and explanation with models, verbal and written materials for utilization of the food label.   Education: General Nutrition Guidelines/Fats and Fiber: -Group instruction provided by verbal, written material, models and posters to present the general guidelines for heart healthy nutrition. Gives an explanation and review of dietary fats and fiber.   Biometrics: Pre Biometrics - 05/16/20 0956      Pre Biometrics   Height  5' 3.5" (1.613 m)    Weight  225 lb 11.2 oz (102.4 kg)    BMI (Calculated)  39.35    Single Leg Stand  0 seconds        Nutrition Therapy Plan and Nutrition Goals:   Nutrition Assessments: Nutrition Assessments - 05/16/20 0957      MEDFICTS Scores   Pre Score  84       MEDIFICTS Score Key:          ?70 Need to make dietary changes          40-70 Heart Healthy Diet         ? 40 Therapeutic Level Cholesterol Diet  Nutrition Goals  Re-Evaluation:   Nutrition Goals Discharge (Final Nutrition Goals Re-Evaluation):   Psychosocial: Target Goals: Acknowledge presence or absence of significant depression and/or stress, maximize coping skills, provide positive support system. Participant is able to verbalize types and ability to use techniques and skills needed for reducing stress and depression.   Education: Depression - Provides group verbal and written instruction on the correlation between heart/lung disease and depressed mood, treatment options, and the stigmas associated with seeking treatment.   Education: Sleep Hygiene -Provides group verbal and written instruction about how sleep can affect your health.  Define sleep hygiene, discuss sleep cycles and impact of sleep habits. Review good sleep hygiene tips.    Education: Stress and Anxiety: - Provides group verbal and written instruction about the health risks of elevated stress and causes of high stress.  Discuss the correlation between heart/lung disease and anxiety and treatment options. Review healthy ways to manage with stress and anxiety.   Initial Review & Psychosocial Screening: Initial Psych Review & Screening - 05/12/20 0940      Initial Review   Current issues with  Current Depression;Current Psychotropic Meds;Current Stress Concerns    Source of Stress Concerns  Chronic Illness    Comments  He does not want to divuldge about his mental health.      Family Dynamics   Good Support System?  Yes    Comments  He can look to his wife and sons for support.      Barriers   Psychosocial barriers to participate in program  There are no identifiable barriers or psychosocial needs.;The patient should benefit from training in stress management and relaxation.;Psychosocial barriers identified (see note)      Screening Interventions   Interventions  Encouraged to exercise;Program counselor consult;Provide feedback about the scores to participant;To provide  support and resources with identified psychosocial needs    Expected Outcomes  Short Term goal: Utilizing psychosocial counselor, staff and physician to assist with identification of specific Stressors or current issues interfering with healing process. Setting desired goal for each stressor or current issue identified.;Long Term Goal: Stressors or current issues are controlled or eliminated.;Short Term goal: Identification and review with participant of any Quality of Life or Depression concerns  found by scoring the questionnaire.;Long Term goal: The participant improves quality of Life and PHQ9 Scores as seen by post scores and/or verbalization of changes       Quality of Life Scores:  Scores of 19 and below usually indicate a poorer quality of life in these areas.  A difference of  2-3 points is a clinically meaningful difference.  A difference of 2-3 points in the total score of the Quality of Life Index has been associated with significant improvement in overall quality of life, self-image, physical symptoms, and general health in studies assessing change in quality of life.  PHQ-9: Recent Review Flowsheet Data    Depression screen [facility] 2/9 05/16/2020 06/10/2018   Decreased Interest 2 0   Down, Depressed, Hopeless 2 0   PHQ - 2 Score 4 0   Altered sleeping 3 2   Tired, decreased energy 2 2   Change in appetite 3 0   Feeling bad or failure about yourself  0 0   Trouble concentrating 3 0   Moving slowly or fidgety/restless 3 0   Suicidal thoughts 3 2    PHQ-9 Score 21 6   Difficult doing work/chores Very difficult Not difficult at all     Interpretation of Total Score  Total Score Depression Severity:  1-4 = Minimal depression, 5-9 = Mild depression, 10-14 = Moderate depression, 15-19 = Moderately severe depression, 20-27 = Severe depression   Psychosocial Evaluation and Intervention: Psychosocial Evaluation - 05/12/20 0952      Psychosocial Evaluation & Interventions    Interventions  Encouraged to exercise with the program and follow exercise prescription    Comments  Patient would not divuldge about his mental health.    Expected Outcomes  Short: Attend [facility] stress management education to decrease stress. Long: Maintain exercise Post [facility] to keep stress at a minimum.    Continue Psychosocial Services   Follow up required by staff       Psychosocial Re-Evaluation:   Psychosocial Discharge (Final Psychosocial Re-Evaluation):   Education: Education Goals: Education classes will be provided on a weekly basis, covering required topics. Participant will state understanding/return demonstration of topics presented.  Learning Barriers/Preferences: Learning Barriers/Preferences - 05/12/20 0955      Learning Barriers/Preferences   Learning Barriers  None    Learning Preferences  None       General Pulmonary Education Topics:  Infection Prevention: - Provides verbal and written material to individual with discussion of infection control including proper hand washing and proper equipment cleaning during exercise session.   Pulmonary Rehab from 05/16/2020 in [facility] Cardiac and Pulmonary Rehab  Date  05/12/20  Educator  [facility]  Instruction Review Code  1- Verbalizes Understanding      Falls Prevention: - Provides verbal and written material to individual with discussion of falls prevention and safety.   Pulmonary Rehab from 05/16/2020 in [facility] Cardiac and Pulmonary Rehab  Date  05/12/20  Educator  [facility]  Instruction Review Code  1- Verbalizes Understanding      Chronic Lung Diseases: - Group verbal and written instruction to review updates, respiratory medications, advancements in procedures and treatments. Discuss use of supplemental oxygen including available portable oxygen systems, continuous and intermittent flow rates, concentrators, personal use and safety guidelines. Review proper use of inhaler and spacers. Provide informative websites for  self-education.    Energy Conservation: - Provide group verbal and written instruction for methods to conserve energy, plan and organize activities. Instruct on pacing techniques, use of adaptive  equipment and posture/positioning to relieve shortness of breath.   Triggers and Exacerbations: - Group verbal and written instruction to review types of environmental triggers and ways to prevent exacerbations. Discuss weather changes, air quality and the benefits of nasal washing. Review warning signs and symptoms to help prevent infections. Discuss techniques for effective airway clearance, coughing, and vibrations.   AED/CPR: - Group verbal and written instruction with the use of models to demonstrate the basic use of the AED with the basic ABC's of resuscitation.   Anatomy and Physiology of the Lungs: - Group verbal and written instruction with the use of models to provide basic lung anatomy and physiology related to function, structure and complications of lung disease.   Anatomy & Physiology of the Heart: - Group verbal and written instruction and models provide basic cardiac anatomy and physiology, with the coronary electrical and arterial systems. Review of Valvular disease and Heart Failure   Cardiac Medications: - Group verbal and written instruction to review commonly prescribed medications for heart disease. Reviews the medication, class of the drug, and side effects.   Other: -Provides group and verbal instruction on various topics (see comments)   Knowledge Questionnaire Score: Knowledge Questionnaire Score - 05/16/20 1002      Knowledge Questionnaire Score   Pre Score  10/16 Education Focus: O2 safety, Nutrition        Core Components/Risk Factors/Patient Goals at Admission: Personal Goals and Risk Factors at Admission - 05/16/20 1003      Core Components/Risk Factors/Patient Goals on Admission    Weight Management  Yes;Weight Loss    Intervention  Weight  Management: Develop a combined nutrition and exercise program designed to reach desired caloric intake, while maintaining appropriate intake of nutrient and fiber, sodium and fats, and appropriate energy expenditure required for the weight goal.;Weight Management: Provide education and appropriate resources to help participant work on and attain dietary goals.;Weight Management/Obesity: Establish reasonable short term and long term weight goals.    Admit Weight  225 lb 11.2 oz (102.4 kg)    Goal Weight: Short Term  220 lb (99.8 kg)    Goal Weight: Long Term  215 lb (97.5 kg)    Expected Outcomes  Short Term: Continue to assess and modify interventions until short term weight is achieved;Long Term: Adherence to nutrition and physical activity/exercise program aimed toward attainment of established weight goal;Weight Loss: Understanding of general recommendations for a balanced deficit meal plan, which promotes 1-2 lb weight loss per week and includes a negative energy balance of [phone] kcal/d;Understanding recommendations for meals to include 15-35% energy as protein, 25-35% energy from fat, 35-60% energy from carbohydrates, less than '200mg'$  of dietary cholesterol, 20-35 gm of total fiber daily;Understanding of distribution of calorie intake throughout the day with the consumption of 4-5 meals/snacks    Improve shortness of breath with ADL's  Yes    Intervention  Provide education, individualized exercise plan and daily activity instruction to help decrease symptoms of SOB with activities of daily living.    Expected Outcomes  Short Term: Improve cardiorespiratory fitness to achieve a reduction of symptoms when performing ADLs;Long Term: Be able to perform more ADLs without symptoms or delay the onset of symptoms    Heart Failure  --    Lipids  Yes    Intervention  Provide education and support for participant on nutrition & aerobic/resistive exercise along with prescribed medications to achieve LDL '70mg'$ ,  HDL >'40mg'$ .    Expected Outcomes  Short Term: Participant states  understanding of desired cholesterol values and is compliant with medications prescribed. Participant is following exercise prescription and nutrition guidelines.;Long Term: Cholesterol controlled with medications as prescribed, with individualized exercise RX and with personalized nutrition plan. Value goals: LDL < '70mg'$ , HDL > 40 mg.       Education:Diabetes - Individual verbal and written instruction to review signs/symptoms of diabetes, desired ranges of glucose level fasting, after meals and with exercise. Acknowledge that pre and post exercise glucose checks will be done for 3 sessions at entry of program.   Education: Know Your Numbers and Risk Factors: -Group verbal and written instruction about important numbers in your health.  Discussion of what are risk factors and how they play a role in the disease process.  Review of Cholesterol, Blood Pressure, Diabetes, and BMI and the role they play in your overall health.   Core Components/Risk Factors/Patient Goals Review:    Core Components/Risk Factors/Patient Goals at Discharge (Final Review):    ITP Comments: ITP Comments    Row Name 05/12/20 0957 05/12/20 1002 05/16/20 0949       ITP Comments  Virtual Visit completed. Patient informed on EP and RD appointment and 6 Minute walk test. Patient also informed of patient health questionnaires on My Chart. Patient Verbalizes understanding. Visit diagnosis can be found in [facility] 04/29/2020.  --  Completed 6MWT and gym orientation.  Initial ITP created and sent for review to Dr. Emily Filbert, Medical Director.        Comments: Initial ITP

## 2020-05-20 ENCOUNTER — Encounter: Payer: Medicare Other | Admitting: *Deleted

## 2020-05-20 ENCOUNTER — Other Ambulatory Visit: Payer: Self-pay

## 2020-05-20 DIAGNOSIS — I272 Pulmonary hypertension, unspecified: Secondary | ICD-10-CM

## 2020-05-20 DIAGNOSIS — R0609 Other forms of dyspnea: Secondary | ICD-10-CM

## 2020-05-20 NOTE — Progress Notes (Signed)
Daily Session Note  Patient Details  Name: Adam Knight MRN: [id number] Date of Birth: Apr 20, 1941 Referring Provider:     Pulmonary Rehab from 05/16/2020 in [facility] Cardiac and Pulmonary Rehab  Referring Provider  [name] MD      Encounter Date: 05/20/2020  Check In: Session Check In - 05/20/20 7225      Check-In   Supervising physician immediately available to respond to emergencies  See telemetry face sheet for immediately available ER MD    Location  [facility]-Cardiac & Pulmonary Rehab    Staff Present  Heath Lark, RN, BSN, CCRP;Joseph Hood RCP,RRT,BSRT;Jessica [place], [place], [place], [place], CCET    Virtual Visit  No    Medication changes reported      No    Fall or balance concerns reported     No    Warm-up and Cool-down  Performed on first and last piece of equipment    Resistance Training Performed  Yes    VAD Patient?  No    PAD/SET Patient?  No      Pain Assessment   Currently in Pain?  No/denies          Social History   Tobacco Use  Smoking Status Former Smoker  . Packs/day: 0.30  . Years: 7.00  . Pack years: 2.10  . Types: Cigarettes  . Quit date: 68  . Years since quitting: 54.4  Smokeless Tobacco Never Used    Goals Met:  Proper associated with RPD/PD & O2 Sat Exercise tolerated well Personal goals reviewed No report of cardiac concerns or symptoms  Goals Unmet:  Not Applicable  Comments: First full day of exercise!  Patient was oriented to gym and equipment including functions, settings, policies, and procedures.  Patient's individual exercise prescription and treatment plan were reviewed.  All starting workloads were established based on the results of the 6 minute walk test done at initial orientation visit.  The plan for exercise progression was also introduced and progression will be customized based on patient's performance and goals.    Dr. Emily Filbert is Medical Director for [place] and [facility].

## 2020-05-25 ENCOUNTER — Encounter: Payer: Medicare Other | Attending: Internal Medicine | Admitting: *Deleted

## 2020-05-25 ENCOUNTER — Other Ambulatory Visit: Payer: Self-pay

## 2020-05-25 DIAGNOSIS — G4733 Obstructive sleep apnea (adult) (pediatric): Secondary | ICD-10-CM | POA: Diagnosis not present

## 2020-05-25 DIAGNOSIS — I2699 Other pulmonary embolism without acute cor pulmonale: Secondary | ICD-10-CM | POA: Insufficient documentation

## 2020-05-25 DIAGNOSIS — Z7901 Long term (current) use of anticoagulants: Secondary | ICD-10-CM | POA: Diagnosis not present

## 2020-05-25 DIAGNOSIS — Z9989 Dependence on other enabling machines and devices: Secondary | ICD-10-CM | POA: Diagnosis not present

## 2020-05-25 DIAGNOSIS — R7303 Prediabetes: Secondary | ICD-10-CM | POA: Insufficient documentation

## 2020-05-25 DIAGNOSIS — I272 Pulmonary hypertension, unspecified: Secondary | ICD-10-CM

## 2020-05-25 DIAGNOSIS — Z79899 Other long term (current) drug therapy: Secondary | ICD-10-CM | POA: Insufficient documentation

## 2020-05-25 DIAGNOSIS — I2724 Chronic thromboembolic pulmonary hypertension: Secondary | ICD-10-CM | POA: Insufficient documentation

## 2020-05-25 DIAGNOSIS — Z87891 Personal history of nicotine dependence: Secondary | ICD-10-CM | POA: Diagnosis not present

## 2020-05-25 DIAGNOSIS — E785 Hyperlipidemia, unspecified: Secondary | ICD-10-CM | POA: Insufficient documentation

## 2020-05-25 NOTE — Progress Notes (Signed)
Daily Session Note  Patient Details  Name: Adam Knight MRN: [id number] Date of Birth: January 25, 1941 Referring Provider:     Pulmonary Rehab from 05/16/2020 in [facility] Cardiac and Pulmonary Rehab  Referring Provider  [name] MD      Encounter Date: 05/25/2020  Check In: Session Check In - 05/25/20 0748      Check-In   Location  [facility]-Cardiac & Pulmonary Rehab    Staff Present  Nyoka Cowden, RN, BSN, [place], MA, RCEP, CCRP, CCET;Susanne Bice, RN, BSN, CCRP    Virtual Visit  No    Medication changes reported      No    Fall or balance concerns reported     No    Tobacco Cessation  No Change    Warm-up and Cool-down  Performed on first and last piece of equipment    VAD Patient?  No    PAD/SET Patient?  No      Pain Assessment   Currently in Pain?  No/denies          Social History   Tobacco Use  Smoking Status Former Smoker  . Packs/day: 0.30  . Years: 7.00  . Pack years: 2.10  . Types: Cigarettes  . Quit date: 70  . Years since quitting: 54.4  Smokeless Tobacco Never Used    Goals Met:  Independence with exercise equipment Exercise tolerated well No report of cardiac concerns or symptoms  Goals Unmet:  Not Applicable  Comments: Pt able to follow exercise prescription today without complaint.  Will continue to monitor for progression.    Dr. Emily Filbert is Medical Director for [place] and [facility].

## 2020-05-27 ENCOUNTER — Encounter: Payer: Medicare Other | Admitting: *Deleted

## 2020-05-27 ENCOUNTER — Other Ambulatory Visit: Payer: Self-pay

## 2020-05-27 DIAGNOSIS — I2724 Chronic thromboembolic pulmonary hypertension: Secondary | ICD-10-CM | POA: Diagnosis not present

## 2020-05-27 DIAGNOSIS — I272 Pulmonary hypertension, unspecified: Secondary | ICD-10-CM

## 2020-05-27 NOTE — Progress Notes (Signed)
Daily Session Note  Patient Details  Name: Adam Knight MRN: [id number] Date of Birth: 1941-11-23 Referring Provider:     Pulmonary Rehab from 05/16/2020 in [facility] Cardiac and Pulmonary Rehab  Referring Provider  [name] MD      Encounter Date: 05/27/2020  Check In: Session Check In - 05/27/20 0747      Check-In   Supervising physician immediately available to respond to emergencies  See telemetry face sheet for immediately available ER MD    Location  [facility]-Cardiac & Pulmonary Rehab    Staff Present  Nyoka Cowden, RN, BSN, MA;Susanne Bice, RN, BSN, CCRP;Melissa Caiola RDN, LDN    Virtual Visit  No    Medication changes reported      No    Fall or balance concerns reported     No    Tobacco Cessation  No Change    Warm-up and Cool-down  Performed on first and last piece of equipment    Resistance Training Performed  Yes    VAD Patient?  No    PAD/SET Patient?  No      Pain Assessment   Currently in Pain?  No/denies          Social History   Tobacco Use  Smoking Status Former Smoker  . Packs/day: 0.30  . Years: 7.00  . Pack years: 2.10  . Types: Cigarettes  . Quit date: 22  . Years since quitting: 54.4  Smokeless Tobacco Never Used    Goals Met:  Independence with exercise equipment Exercise tolerated well No report of cardiac concerns or symptoms  Goals Unmet:  Not Applicable  Comments: Pt able to follow exercise prescription today without complaint.  Will continue to monitor for progression.    Dr. Emily Filbert is Medical Director for [place] and [facility].

## 2020-05-30 ENCOUNTER — Encounter: Payer: Medicare Other | Admitting: Dermatology

## 2020-05-30 ENCOUNTER — Other Ambulatory Visit: Payer: Self-pay

## 2020-05-30 DIAGNOSIS — I2724 Chronic thromboembolic pulmonary hypertension: Secondary | ICD-10-CM | POA: Diagnosis not present

## 2020-05-30 DIAGNOSIS — I272 Pulmonary hypertension, unspecified: Secondary | ICD-10-CM

## 2020-05-30 NOTE — Progress Notes (Signed)
Daily Session Note  Patient Details  Name: Adam Knight MRN: [id number] Date of Birth: [birth year] Referring Provider:     Pulmonary Rehab from 05/16/2020 in [facility] Cardiac and Pulmonary Rehab  Referring Provider  [name] MD      Encounter Date: 05/30/2020  Check In: Session Check In - 05/30/20 0946      Check-In   Supervising physician immediately available to respond to emergencies  See telemetry face sheet for immediately available ER MD    Location  [facility]-Cardiac & Pulmonary Rehab    Staff Present  Coralie Keens, MS Exercise Physiologist;Kelly Amedeo Plenty, BS, ACSM CEP, Exercise Physiologist;Susanne Bice, RN, BSN, CCRP;Joseph Hood RCP,RRT,BSRT    Virtual Visit  No    Medication changes reported      No    Fall or balance concerns reported     No    Tobacco Cessation  No Change    Warm-up and Cool-down  Performed on first and last piece of equipment    Resistance Training Performed  Yes    VAD Patient?  No    PAD/SET Patient?  No      Pain Assessment   Currently in Pain?  No/denies          Social History   Tobacco Use  Smoking Status Former Smoker  . Packs/day: 0.30  . Years: 7.00  . Pack years: 2.10  . Types: Cigarettes  . Quit date: 41  . Years since quitting: 54.4  Smokeless Tobacco Never Used    Goals Met:  Proper associated with RPD/PD & O2 Sat Using PLB without cueing & demonstrates good technique Exercise tolerated well No report of cardiac concerns or symptoms Strength training completed today  Goals Unmet:  Not Applicable  Comments: Pt able to follow exercise prescription today without complaint.  Will continue to monitor for progression.   Dr. Emily Filbert is Medical Director for [place] and [facility].

## 2020-06-01 ENCOUNTER — Ambulatory Visit: Payer: Medicare Other | Admitting: Dermatology

## 2020-06-01 ENCOUNTER — Other Ambulatory Visit: Payer: Self-pay

## 2020-06-01 ENCOUNTER — Encounter: Payer: Medicare Other | Admitting: *Deleted

## 2020-06-01 DIAGNOSIS — I272 Pulmonary hypertension, unspecified: Secondary | ICD-10-CM

## 2020-06-01 DIAGNOSIS — R0609 Other forms of dyspnea: Secondary | ICD-10-CM

## 2020-06-01 DIAGNOSIS — I2724 Chronic thromboembolic pulmonary hypertension: Secondary | ICD-10-CM | POA: Diagnosis not present

## 2020-06-01 NOTE — Progress Notes (Signed)
Daily Session Note  Patient Details  Name: Adam Knight MRN: [id number] Date of Birth: 1941/04/24 Referring Provider:     Pulmonary Rehab from 05/16/2020 in [facility] Cardiac and Pulmonary Rehab  Referring Provider  [name] MD      Encounter Date: 06/01/2020  Check In: Session Check In - 06/01/20 0821      Check-In   Supervising physician immediately available to respond to emergencies  See telemetry face sheet for immediately available ER MD    Location  [facility]-Cardiac & Pulmonary Rehab    Staff Present  Heath Lark, RN, BSN, CCRP;Melissa Caiola RDN, LDN;Joseph Hood RCP,RRT,BSRT    Virtual Visit  No    Medication changes reported      No    Fall or balance concerns reported     No    Warm-up and Cool-down  Performed on first and last piece of equipment    Resistance Training Performed  Yes      Pain Assessment   Currently in Pain?  No/denies          Social History   Tobacco Use  Smoking Status Former Smoker  . Packs/day: 0.30  . Years: 7.00  . Pack years: 2.10  . Types: Cigarettes  . Quit date: 63  . Years since quitting: 54.4  Smokeless Tobacco Never Used    Goals Met:  Proper associated with RPD/PD & O2 Sat Independence with exercise equipment Exercise tolerated well No report of cardiac concerns or symptoms  Goals Unmet:  Not Applicable  Comments: Pt able to follow exercise prescription today without complaint.  Will continue to monitor for progression.    Dr. Emily Filbert is Medical Director for [place] and [facility].

## 2020-06-03 ENCOUNTER — Encounter: Payer: Medicare Other | Admitting: *Deleted

## 2020-06-03 ENCOUNTER — Other Ambulatory Visit: Payer: Self-pay

## 2020-06-03 DIAGNOSIS — R0609 Other forms of dyspnea: Secondary | ICD-10-CM

## 2020-06-03 DIAGNOSIS — I2724 Chronic thromboembolic pulmonary hypertension: Secondary | ICD-10-CM | POA: Diagnosis not present

## 2020-06-03 DIAGNOSIS — I272 Pulmonary hypertension, unspecified: Secondary | ICD-10-CM

## 2020-06-03 NOTE — Progress Notes (Signed)
Daily Session Note  Patient Details  Name: Adam Knight MRN: [id number] Date of Birth: May 26, 1941 Referring Provider:     Pulmonary Rehab from 05/16/2020 in [facility] Cardiac and Pulmonary Rehab  Referring Provider [name] MD      Encounter Date: 06/03/2020  Check In:  Session Check In - 06/03/20 0843      Check-In   Supervising physician immediately available to respond to emergencies See telemetry face sheet for immediately available ER MD    Location [facility]-Cardiac & Pulmonary Rehab    Staff Present Heath Lark, RN, BSN, CCRP;Jessica [place], MA, RCEP, CCRP, CCET;Joseph Daisy RCP,RRT,BSRT    Virtual Visit No    Medication changes reported     No    Fall or balance concerns reported    No    Warm-up and Cool-down Performed on first and last piece of equipment    Resistance Training Performed Yes    VAD Patient? No    PAD/SET Patient? No      Pain Assessment   Currently in Pain? No/denies              Social History   Tobacco Use  Smoking Status Former Smoker  . Packs/day: 0.30  . Years: 7.00  . Pack years: 2.10  . Types: Cigarettes  . Quit date: 75  . Years since quitting: 54.4  Smokeless Tobacco Never Used    Goals Met:  Proper associated with RPD/PD & O2 Sat Independence with exercise equipment Exercise tolerated well No report of cardiac concerns or symptoms  Goals Unmet:  Not Applicable  Comments: Pt able to follow exercise prescription today without complaint.  Will continue to monitor for progression.    Dr. Emily Filbert is Medical Director for [place] and [facility].

## 2020-06-06 ENCOUNTER — Encounter: Payer: Medicare Other | Admitting: *Deleted

## 2020-06-06 ENCOUNTER — Other Ambulatory Visit: Payer: Self-pay

## 2020-06-06 DIAGNOSIS — I272 Pulmonary hypertension, unspecified: Secondary | ICD-10-CM

## 2020-06-06 DIAGNOSIS — R0609 Other forms of dyspnea: Secondary | ICD-10-CM

## 2020-06-06 DIAGNOSIS — I2724 Chronic thromboembolic pulmonary hypertension: Secondary | ICD-10-CM | POA: Diagnosis not present

## 2020-06-06 NOTE — Progress Notes (Signed)
Daily Session Note  Patient Details  Name: Corey Laski MRN: [id number] Date of Birth: Mar 12, 1941 Referring Provider:     Pulmonary Rehab from 05/16/2020 in [facility] Cardiac and Pulmonary Rehab  Referring Provider [name] MD      Encounter Date: 06/06/2020  Check In:  Session Check In - 06/06/20 0817      Check-In   Supervising physician immediately available to respond to emergencies See telemetry face sheet for immediately available ER MD    Location [facility]-Cardiac & Pulmonary Rehab    Staff Present Heath Lark, RN, BSN, CCRP;Joseph Hood RCP,RRT,BSRT;Kelly [place], [place], ACSM CEP, Exercise Physiologist    Virtual Visit No    Medication changes reported     No    Fall or balance concerns reported    No    Warm-up and Cool-down Performed on first and last piece of equipment    Resistance Training Performed Yes    VAD Patient? No    PAD/SET Patient? No      Pain Assessment   Currently in Pain? No/denies              Social History   Tobacco Use  Smoking Status Former Smoker  . Packs/day: 0.30  . Years: 7.00  . Pack years: 2.10  . Types: Cigarettes  . Quit date: 61  . Years since quitting: 54.4  Smokeless Tobacco Never Used    Goals Met:  Proper associated with RPD/PD & O2 Sat Independence with exercise equipment Exercise tolerated well No report of cardiac concerns or symptoms  Goals Unmet:  Not Applicable  Comments: Pt able to follow exercise prescription today without complaint.  Will continue to monitor for progression.    Dr. Emily Filbert is Medical Director for [place] and [facility].

## 2020-06-07 ENCOUNTER — Other Ambulatory Visit: Payer: Self-pay

## 2020-06-07 ENCOUNTER — Ambulatory Visit (INDEPENDENT_AMBULATORY_CARE_PROVIDER_SITE_OTHER): Payer: Medicare Other | Admitting: Dermatology

## 2020-06-07 DIAGNOSIS — D234 Other benign neoplasm of skin of scalp and neck: Secondary | ICD-10-CM | POA: Diagnosis not present

## 2020-06-07 DIAGNOSIS — L578 Other skin changes due to chronic exposure to nonionizing radiation: Secondary | ICD-10-CM | POA: Diagnosis not present

## 2020-06-07 DIAGNOSIS — D239 Other benign neoplasm of skin, unspecified: Secondary | ICD-10-CM

## 2020-06-07 DIAGNOSIS — L570 Actinic keratosis: Secondary | ICD-10-CM

## 2020-06-07 DIAGNOSIS — C44319 Basal cell carcinoma of skin of other parts of face: Secondary | ICD-10-CM

## 2020-06-07 NOTE — Progress Notes (Signed)
Follow-Up Visit   Subjective  Adam Knight is a 79 y.o. male who presents for the following: Follow-up.  Patient presents today for a discussion on how to treat a bx proven BCC on Right mid upper forehead, and a atypical lentigo.   The following portions of the chart were reviewed this encounter and updated as appropriate:      Review of Systems:  No other skin or systemic complaints except as noted in HPI or Assessment and Plan.  Objective  Well appearing patient in no apparent distress; mood and affect are within normal limits.  A focused examination was performed including Head. Relevant physical exam findings are noted in the Assessment and Plan.  Objective  Right Temporal Scalp: Well healed bx site with faint residual pigmentation  Objective  Forehead x 5, and left upper eyebrow (6): Erythematous thin papules/macules with gritty scale.   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 A complete surgical excision on this site to ensure complete removal of this lesion is recommended. (MM:ah 04/20/20)  Will refer to Dr. Lacinda Axon at [facility] for Mohs surgery  Ambulatory referral to Dermatology - Right Temporal Scalp  AK (actinic keratosis) (6) Forehead x 5, and left upper eyebrow  Cryotherapy today Prior to procedure, discussed risks of blister formation, small wound, skin dyspigmentation, or rare scar following cryotherapy.    Schedule PDT treatment for face in about 2 weeks on a Thursday, Repeat in 1 month.  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 Anesthesia: the lesion was anesthetized in a standard fashion   Anesthetic:  1% lidocaine w/ epinephrine 1-100,000 buffered w/ 8.4% NaHCO3 Curettage performed in three different directions: Yes   Electrodesiccation performed over the curetted area: Yes   Lesion length (cm):  0.4 Lesion width (cm):  0.4 Margin per side (cm):  0.1 Final wound size (cm):  0.6 Hemostasis achieved with:  pressure, aluminum chloride and electrodesiccation Outcome: patient tolerated procedure well with no complications   Post-procedure details: wound care instructions given   Post-procedure details comment:  Ointment and bandage applied  Biopsy-proven  Actinic Damage - diffuse scaly erythematous macules with underlying dyspigmentation - Recommend daily broad spectrum sunscreen SPF 30+ to sun-exposed areas, reapply every 2 hours as needed.  - Call for new or changing lesions.    Return for 2 weeks on a Thursday for PDT, then another appt month later, 79m BCC and AK f/u.  Marene Lenz, CMA, am acting as scribe for Brendolyn Patty, MD .  Documentation: I have reviewed the above documentation  for accuracy and completeness, and I agree with the above.  Brendolyn Patty MD

## 2020-06-07 NOTE — Patient Instructions (Signed)
Recommend daily broad spectrum sunscreen SPF 30+ to sun-exposed areas, reapply every 2 hours as needed. Call for new or changing lesions.  Electrodesiccation and Curettage ("Scrape and Burn") Wound Care Instructions  1. Leave the original bandage on for 24 hours if possible.  If the bandage becomes soaked or soiled before that time, it is OK to remove it and examine the wound.  A small amount of post-operative bleeding is normal.  If excessive bleeding occurs, remove the bandage, place gauze over the site and apply continuous pressure (no peeking) over the area for 30 minutes. If this does not work, please call our clinic as soon as possible or page your doctor if it is after hours.   2. Once a day, cleanse the wound with soap and water. It is fine to shower. If a thick crust develops you may use a Q-tip dipped into dilute hydrogen peroxide (mix 1:1 with water) to dissolve it.  Hydrogen peroxide can slow the healing process, so use it only as needed.    3. After washing, apply petroleum jelly (Vaseline) or an antibiotic ointment if your doctor prescribed one for you, followed by a bandage.    4. For best healing, the wound should be covered with a layer of ointment at all times. If you are not able to keep the area covered with a bandage to hold the ointment in place, this may mean re-applying the ointment several times a day.  Continue this wound care until the wound has healed and is no longer open. It may take several weeks for the wound to heal and close.  Itching and mild discomfort is normal during the healing process.  If you have any discomfort, you can take Tylenol (acetaminophen) or ibuprofen as directed on the bottle. (Please do not take these if you have an allergy to them or cannot take them for another reason).  Some redness, tenderness and white or yellow material in the wound is normal healing.  If the area becomes very sore and red, or develops a thick yellow-green material (pus), it  may be infected; please notify us.    Wound healing continues for up to one year following surgery. It is not unusual to experience pain in the scar from time to time during the interval.  If the pain becomes severe or the scar thickens, you should notify the office.    A slight amount of redness in a scar is expected for the first six months.  After six months, the redness will fade and the scar will soften and fade.  The color difference becomes less noticeable with time.  If there are any problems, return for a post-op surgery check at your earliest convenience.  To improve the appearance of the scar, you can use silicone scar gel, cream, or sheets (such as Mederma or Serica) every night for up to one year. These are available over the counter (without a prescription).  Please call our office at [phone] for any questions or concerns.  

## 2020-06-08 ENCOUNTER — Encounter: Payer: Self-pay | Admitting: *Deleted

## 2020-06-08 ENCOUNTER — Encounter: Payer: Medicare Other | Admitting: *Deleted

## 2020-06-08 DIAGNOSIS — I2724 Chronic thromboembolic pulmonary hypertension: Secondary | ICD-10-CM | POA: Diagnosis not present

## 2020-06-08 DIAGNOSIS — I272 Pulmonary hypertension, unspecified: Secondary | ICD-10-CM

## 2020-06-08 DIAGNOSIS — R0609 Other forms of dyspnea: Secondary | ICD-10-CM

## 2020-06-08 NOTE — Progress Notes (Signed)
Daily Session Note  Patient Details  Name: Adam Knight MRN: [id number] Date of Birth: [birth year] Referring Provider:     Pulmonary Rehab from 05/16/2020 in [facility] Cardiac and Pulmonary Rehab  Referring Provider [name] MD      Encounter Date: 06/08/2020  Check In:  Session Check In - 06/08/20 0821      Check-In   Supervising physician immediately available to respond to emergencies See telemetry face sheet for immediately available ER MD    Location [facility]-Cardiac & Pulmonary Rehab    Staff Present Heath Lark, RN, BSN, CCRP;Joseph Foy [place], [place], ACSM CEP, Exercise Physiologist    Virtual Visit No    Medication changes reported     No    Fall or balance concerns reported    No    Warm-up and Cool-down Performed on first and last piece of equipment    Resistance Training Performed Yes    VAD Patient? No    PAD/SET Patient? No      Pain Assessment   Currently in Pain? No/denies              Social History   Tobacco Use  Smoking Status Former Smoker  . Packs/day: 0.30  . Years: 7.00  . Pack years: 2.10  . Types: Cigarettes  . Quit date: 82  . Years since quitting: 54.4  Smokeless Tobacco Never Used    Goals Met:  Proper associated with RPD/PD & O2 Sat Independence with exercise equipment Exercise tolerated well No report of cardiac concerns or symptoms  Goals Unmet:  Not Applicable  Comments: Pt able to follow exercise prescription today without complaint.  Will continue to monitor for progression. Reviewed home exercise with pt today.  Pt plans to walk/consider [facility] for exercise.  Reviewed THR, pulse, RPE, sign and symptoms, pulse oximetery and when to call 911 or MD.  Also discussed weather considerations and indoor options.  Pt voiced understanding.    Dr. Emily Filbert is Medical Director for [place] and [facility].

## 2020-06-08 NOTE — Progress Notes (Signed)
Pulmonary Individual Treatment Plan  Patient Details  Name: Adam Knight MRN: [id number] Date of Birth: [birth year] Referring Provider:     Pulmonary Rehab from 05/16/2020 in [facility] Cardiac and Pulmonary Rehab  Referring Provider [name] MD      Initial Encounter Date:    Pulmonary Rehab from 05/16/2020 in [facility] Cardiac and Pulmonary Rehab  Date 05/16/20      Visit Diagnosis: Pulmonary hypertension ([place])  Dyspnea on exertion  Patient's Home Medications on Admission:  Current Outpatient Medications:  .  albuterol (PROVENTIL HFA;VENTOLIN HFA) 108 (90 Base) MCG/ACT inhaler, Inhale 2 puffs into the lungs every 6 (six) hours as needed for wheezing or shortness of breath., Disp: 1 Inhaler, Rfl: 2 .  B Complex-C (SUPER B COMPLEX PO), Take by mouth daily., Disp: , Rfl:  .  busPIRone (BUSPAR) 5 MG tablet, Take 5 mg by mouth 2 (two) times daily., Disp: , Rfl:  .  Calcium Carbonate Antacid (CALCIUM CARBONATE, DOSED IN MG ELEMENTAL CALCIUM,) 1250 MG/5ML SUSP, Take by mouth., Disp: , Rfl:  .  Cholecalciferol (VITAMIN D) 2000 UNITS tablet, Take 2,000 Units by mouth daily., Disp: , Rfl:  .  finasteride (PROSCAR) 5 MG tablet, Take 5 mg by mouth daily. , Disp: , Rfl:  .  potassium chloride (KLOR-CON) 10 MEQ tablet, Take 40 mEq by mouth daily. , Disp: , Rfl:  .  torsemide (DEMADEX) 20 MG tablet, Take 2 tablets (40 mg total) by mouth daily., Disp: 180 tablet, Rfl: 1 .  warfarin (COUMADIN) 5 MG tablet, TAKE 1 TABLET DAILY OR TAKE AS DIRECTED BY THE ANTICOAGULATION CLINIC., Disp: 90 tablet, Rfl: 0  Past Medical History: Past Medical History:  Diagnosis Date  . Actinic keratosis   . 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 Basal cell carcinoma 04/18/2020   Right mid upper forehead  . 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 Dyslipidemia   . Edema   . Gastric ulcer   . OSA on CPAP   . Prediabetes    . Pulmonary embolism ([place])   . Seasonal allergies     Tobacco Use: Social History   Tobacco Use  Smoking Status Former Smoker  . Packs/day: 0.30  . Years: 7.00  . Pack years: 2.10  . Types: Cigarettes  . Quit date: 53  . Years since quitting: 54.4  Smokeless Tobacco Never Used    Labs: Recent Review Flowsheet Data   There is no flowsheet data to display.      Pulmonary Assessment Scores:  Pulmonary Assessment Scores    Row Name 05/16/20 1005         ADL UCSD   ADL Phase Entry     SOB Score total 28     Rest 0     Walk 1     Stairs 3     Bath 0     Dress 1     Shop 1       CAT Score   CAT Score 11       mMRC Score   mMRC Score 2            UCSD: Self-administered rating of dyspnea associated with activities of daily living (ADLs) 6-point scale (0 = "not at all" to 5 = "maximal or unable to do because of breathlessness")  Scoring Scores range from 0 to 120.  Minimally important difference is 5 units  CAT: CAT can identify  the health impairment of COPD patients and is better correlated with disease progression.  CAT has a scoring range of zero to 40. The CAT score is classified into four groups of low (less than 10), medium (10 - 20), high (21-30) and very high (31-40) based on the impact level of disease on health status. A CAT score over 10 suggests significant symptoms.  A worsening CAT score could be explained by an exacerbation, poor medication adherence, poor inhaler technique, or progression of COPD or comorbid conditions.  CAT MCID is 2 points  mMRC: mMRC (Modified Medical Research Council) Dyspnea Scale is used to assess the degree of baseline functional disability in patients of respiratory disease due to dyspnea. No minimal important difference is established. A decrease in score of 1 point or greater is considered a positive change.   Pulmonary Function Assessment:  Pulmonary Function Assessment - 05/12/20 0939      Breath   Shortness  of Breath Yes;Limiting activity           Exercise Target Goals: Exercise Program Goal: Individual exercise prescription set using results from initial 6 min walk test and THRR while considering  patient's activity barriers and safety.   Exercise Prescription Goal: Initial exercise prescription builds to 30-45 minutes a day of aerobic activity, 2-3 days per week.  Home exercise guidelines will be given to patient during program as part of exercise prescription that the participant will acknowledge.  Education: Aerobic Exercise & Resistance Training: - Gives group verbal and written instruction on the various components of exercise. Focuses on aerobic and resistive training programs and the benefits of this training and how to safely progress through these programs..   Education: Exercise & Equipment Safety: - Individual verbal instruction and demonstration of equipment use and safety with use of the equipment.   Pulmonary Rehab from 05/16/2020 in [facility] Cardiac and Pulmonary Rehab  Date 05/12/20  Educator [facility]  Instruction Review Code 1- Verbalizes Understanding      Education: Exercise Physiology & General Exercise Guidelines: - Group verbal and written instruction with models to review the exercise physiology of the cardiovascular system and associated critical values. Provides general exercise guidelines with specific guidelines to those with heart or lung disease.    Education: Flexibility, Balance, Mind/Body Relaxation: Provides group verbal/written instruction on the benefits of flexibility and balance training, including mind/body exercise modes such as yoga, pilates and tai chi.  Demonstration and skill practice provided.   Activity Barriers & Risk Stratification:  Activity Barriers & Cardiac Risk Stratification - 05/16/20 0954      Activity Barriers & Cardiac Risk Stratification   Activity Barriers Deconditioning;Arthritis;Muscular Weakness;Shortness of Breath;History of  Falls;Balance Concerns;Joint Problems           6 Minute Walk:  6 Minute Walk    Row Name 05/16/20 0950         6 Minute Walk   Phase Initial     Distance 860 feet     Walk Time 6 minutes     # of Rest Breaks 0     MPH 1.63     METS 1.15     RPE 13     Perceived Dyspnea  3     VO2 Peak 4.02     Symptoms Yes (comment)     Comments SOB     Resting HR 79 bpm     Resting BP 124/66     Resting Oxygen Saturation  95 %     Exercise  Oxygen Saturation  during 6 min walk 92 %     Max Ex. HR 110 bpm     Max Ex. BP 136/70     2 Minute Post BP 124/68       Interval HR   1 Minute HR 98     2 Minute HR 100     3 Minute HR 108     4 Minute HR 107     5 Minute HR 109     6 Minute HR 110     2 Minute Post HR 94     Interval Heart Rate? Yes       Interval Oxygen   Interval Oxygen? Yes     Baseline Oxygen Saturation % 95 %     1 Minute Oxygen Saturation % 93 %     1 Minute Liters of Oxygen 0 L  Room Air     2 Minute Oxygen Saturation % 92 %     2 Minute Liters of Oxygen 0 L     3 Minute Oxygen Saturation % 93 %     3 Minute Liters of Oxygen 0 L     4 Minute Oxygen Saturation % 94 %     4 Minute Liters of Oxygen 0 L     5 Minute Oxygen Saturation % 94 %     5 Minute Liters of Oxygen 0 L     6 Minute Oxygen Saturation % 95 %     6 Minute Liters of Oxygen 0 L     2 Minute Post Oxygen Saturation % 96 %     2 Minute Post Liters of Oxygen 0 L           Oxygen Initial Assessment:  Oxygen Initial Assessment - 05/12/20 0939      Home Oxygen   Home Oxygen Device None    Sleep Oxygen Prescription CPAP    Liters per minute 0    Home Exercise Oxygen Prescription None    Home at Rest Exercise Oxygen Prescription None    Compliance with Home Oxygen Use Yes      Initial 6 min Walk   Oxygen Used None      Program Oxygen Prescription   Program Oxygen Prescription None      Intervention   Short Term Goals To learn and exhibit compliance with exercise, home and travel O2  prescription;To learn and understand importance of monitoring SPO2 with pulse oximeter and demonstrate accurate use of the pulse oximeter.;To learn and understand importance of maintaining oxygen saturations>88%;To learn and demonstrate proper pursed lip breathing techniques or other breathing techniques.;To learn and demonstrate proper use of respiratory medications    Long  Term Goals Exhibits compliance with exercise, home and travel O2 prescription;Verbalizes importance of monitoring SPO2 with pulse oximeter and return demonstration;Maintenance of O2 saturations>88%;Exhibits proper breathing techniques, such as pursed lip breathing or other method taught during program session;Compliance with respiratory medication;Demonstrates proper use of MDI's           Oxygen Re-Evaluation:  Oxygen Re-Evaluation    Row Name 05/20/20 0824             Program Oxygen Prescription   Program Oxygen Prescription None         Home Oxygen   Home Oxygen Device None       Sleep Oxygen Prescription CPAP       Liters per minute 0       Home Exercise Oxygen Prescription  None       Home at Rest Exercise Oxygen Prescription None       Compliance with Home Oxygen Use Yes         Goals/Expected Outcomes   Short Term Goals To learn and demonstrate proper pursed lip breathing techniques or other breathing techniques.       Long  Term Goals Exhibits proper breathing techniques, such as pursed lip breathing or other method taught during program session       Comments Reviewed PLB technique with pt.  Talked about how it works and it's importance in maintaining their exercise saturations.       Goals/Expected Outcomes Short: Become more profiecient at using PLB.   Long: Become independent at using PLB.              Oxygen Discharge (Final Oxygen Re-Evaluation):  Oxygen Re-Evaluation - 05/20/20 0824      Program Oxygen Prescription   Program Oxygen Prescription None      Home Oxygen   Home Oxygen  Device None    Sleep Oxygen Prescription CPAP    Liters per minute 0    Home Exercise Oxygen Prescription None    Home at Rest Exercise Oxygen Prescription None    Compliance with Home Oxygen Use Yes      Goals/Expected Outcomes   Short Term Goals To learn and demonstrate proper pursed lip breathing techniques or other breathing techniques.    Long  Term Goals Exhibits proper breathing techniques, such as pursed lip breathing or other method taught during program session    Comments Reviewed PLB technique with pt.  Talked about how it works and it's importance in maintaining their exercise saturations.    Goals/Expected Outcomes Short: Become more profiecient at using PLB.   Long: Become independent at using PLB.           Initial Exercise Prescription:  Initial Exercise Prescription - 05/16/20 0900      Date of Initial Exercise RX and Referring Provider   Date 05/16/20    Referring Provider [name] MD      Treadmill   MPH 1.2    Grade 0.5    Minutes 15    METs 2      Recumbant Bike   Level 1    RPM 50    Watts 11    Minutes 15    METs 2      NuStep   Level 1    SPM 80    Minutes 15    METs 2      T5 Nustep   Level 1    SPM 80    Minutes 15    METs 2      Prescription Details   Frequency (times per week) 2    Duration Progress to 30 minutes of continuous aerobic without signs/symptoms of physical distress      Intensity   THRR 40-80% of Max Heartrate 104-129    Ratings of Perceived Exertion 11-13    Perceived Dyspnea 0-4      Progression   Progression Continue to progress workloads to maintain intensity without signs/symptoms of physical distress.      Resistance Training   Training Prescription Yes    Weight 3 lb    Reps 10-15           Perform Capillary Blood Glucose checks as needed.  Exercise Prescription Changes:  Exercise Prescription Changes    Row Name 05/16/20 0900 05/24/20  1600 06/07/20 1500         Response to Exercise    Blood Pressure (Admit) 124/66 126/80 104/62     Blood Pressure (Exercise) 136/70 162/78 142/60     Blood Pressure (Exit) 124/68 126/64 118/70     Heart Rate (Admit) 79 bpm 100 bpm 82 bpm     Heart Rate (Exercise) 110 bpm 113 bpm 90 bpm     Heart Rate (Exit) 93 bpm 98 bpm 94 bpm     Oxygen Saturation (Admit) 95 % 98 % 93 %     Oxygen Saturation (Exercise) 92 % 95 % 94 %     Oxygen Saturation (Exit) 95 % 93 % 93 %     Rating of Perceived Exertion (Exercise) '13 13 12     '$ Perceived Dyspnea (Exercise) 3 1 --     Symptoms SOB -- --     Comments walk test results first full day of exercise --     Duration -- Progress to 30 minutes of  aerobic without signs/symptoms of physical distress Continue with 30 min of aerobic exercise without signs/symptoms of physical distress.     Intensity -- THRR unchanged THRR unchanged       Progression   Progression -- Continue to progress workloads to maintain intensity without signs/symptoms of physical distress. Continue to progress workloads to maintain intensity without signs/symptoms of physical distress.     Average METs -- 2 2.43       Resistance Training   Training Prescription -- Yes Yes     Weight -- 3 lb 4 lb     Reps -- 10-15 10-15       Interval Training   Interval Training -- No No       Treadmill   MPH -- 1.2 --     Grade -- 0.5 --     Minutes -- 15 --     METs -- 2 --       Recumbant Bike   Level -- -- 1     RPM -- -- 50     Minutes -- -- 15     METs -- -- 2.43       NuStep   Level -- -- 2     SPM -- -- 80     Minutes -- -- 15       T5 Nustep   Level -- 1 --     Minutes -- 15 --            Exercise Comments:  Exercise Comments    Row Name 05/20/20 [phone]           Exercise Comments First full day of exercise!  Patient was oriented to gym and equipment including functions, settings, policies, and procedures.  Patient's individual exercise prescription and treatment plan were reviewed.  All starting workloads were  established based on the results of the 6 minute walk test done at initial orientation visit.  The plan for exercise progression was also introduced and progression will be customized based on patient's performance and goals.              Exercise Goals and Review:  Exercise Goals    Row Name 05/16/20 0956             Exercise Goals   Increase Physical Activity Yes       Intervention Provide advice, education, support and counseling about physical activity/exercise needs.;Develop an individualized exercise prescription for aerobic and  resistive training based on initial evaluation findings, risk stratification, comorbidities and participant's personal goals.       Expected Outcomes Long Term: Add in home exercise to make exercise part of routine and to increase amount of physical activity.;Short Term: Attend rehab on a regular basis to increase amount of physical activity.;Long Term: Exercising regularly at least 3-5 days a week.       Increase Strength and Stamina Yes       Intervention Provide advice, education, support and counseling about physical activity/exercise needs.;Develop an individualized exercise prescription for aerobic and resistive training based on initial evaluation findings, risk stratification, comorbidities and participant's personal goals.       Expected Outcomes Short Term: Increase workloads from initial exercise prescription for resistance, speed, and METs.;Short Term: Perform resistance training exercises routinely during rehab and add in resistance training at home;Long Term: Improve cardiorespiratory fitness, muscular endurance and strength as measured by increased METs and functional capacity (6MWT)       Able to understand and use rate of perceived exertion (RPE) scale Yes       Intervention Provide education and explanation on how to use RPE scale       Expected Outcomes Short Term: Able to use RPE daily in rehab to express subjective intensity level;Long Term:   Able to use RPE to guide intensity level when exercising independently       Able to understand and use Dyspnea scale Yes       Intervention Provide education and explanation on how to use Dyspnea scale       Expected Outcomes Long Term: Able to use Dyspnea scale to guide intensity level when exercising independently;Short Term: Able to use Dyspnea scale daily in rehab to express subjective sense of shortness of breath during exertion       Knowledge and understanding of Target Heart Rate Range (THRR) Yes       Intervention Provide education and explanation of THRR including how the numbers were predicted and where they are located for reference       Expected Outcomes Short Term: Able to state/look up THRR;Short Term: Able to use daily as guideline for intensity in rehab;Long Term: Able to use THRR to govern intensity when exercising independently       Able to check pulse independently Yes       Intervention Provide education and demonstration on how to check pulse in carotid and radial arteries.;Review the importance of being able to check your own pulse for safety during independent exercise       Expected Outcomes Short Term: Able to explain why pulse checking is important during independent exercise;Long Term: Able to check pulse independently and accurately       Understanding of Exercise Prescription Yes       Intervention Provide education, explanation, and written materials on patient's individual exercise prescription       Expected Outcomes Short Term: Able to explain program exercise prescription;Long Term: Able to explain home exercise prescription to exercise independently              Exercise Goals Re-Evaluation :  Exercise Goals Re-Evaluation    Row Name 05/20/20 4008 05/24/20 1559 06/07/20 1506         Exercise Goal Re-Evaluation   Exercise Goals Review Able to understand and use rate of perceived exertion (RPE) scale;Able to understand and use Dyspnea scale;Knowledge and  understanding of Target Heart Rate Range (THRR);Understanding of Exercise Prescription Increase Physical Activity;Increase Strength  and Stamina;Understanding of Exercise Prescription Increase Physical Activity;Increase Strength and Stamina;Understanding of Exercise Prescription     Comments Reviewed RPE and dyspnea scales, THR and program prescription with pt today.  Pt voiced understanding and was given a copy of goals to take home. Karn Pickler has completed his first full day of exercise.  We will continue to monitor his progress. Karn Pickler has increased workloads on T4 and weights to 4 lb for strength work.     Expected Outcomes Short: Use RPE daily to regulate intensity. Long: Follow program prescription in THR. Short: Continue to attend rehab regularly Long: Continue follow program prescription. Short : exercise consistently Long: continue to improve MET level            Discharge Exercise Prescription (Final Exercise Prescription Changes):  Exercise Prescription Changes - 06/07/20 1500      Response to Exercise   Blood Pressure (Admit) 104/62    Blood Pressure (Exercise) 142/60    Blood Pressure (Exit) 118/70    Heart Rate (Admit) 82 bpm    Heart Rate (Exercise) 90 bpm    Heart Rate (Exit) 94 bpm    Oxygen Saturation (Admit) 93 %    Oxygen Saturation (Exercise) 94 %    Oxygen Saturation (Exit) 93 %    Rating of Perceived Exertion (Exercise) 12    Duration Continue with 30 min of aerobic exercise without signs/symptoms of physical distress.    Intensity THRR unchanged      Progression   Progression Continue to progress workloads to maintain intensity without signs/symptoms of physical distress.    Average METs 2.43      Resistance Training   Training Prescription Yes    Weight 4 lb    Reps 10-15      Interval Training   Interval Training No      Recumbant Bike   Level 1    RPM 50    Minutes 15    METs 2.43      NuStep   Level 2    SPM 80    Minutes 15            Nutrition:  Target Goals: Understanding of nutrition guidelines, daily intake of sodium '1500mg'$ , cholesterol '200mg'$ , calories 30% from fat and 7% or less from saturated fats, daily to have 5 or more servings of fruits and vegetables.  Education: Controlling Sodium/Reading Food Labels -Group verbal and written material supporting the discussion of sodium use in heart healthy nutrition. Review and explanation with models, verbal and written materials for utilization of the food label.   Education: General Nutrition Guidelines/Fats and Fiber: -Group instruction provided by verbal, written material, models and posters to present the general guidelines for heart healthy nutrition. Gives an explanation and review of dietary fats and fiber.   Biometrics:  Pre Biometrics - 05/16/20 0956      Pre Biometrics   Height 5' 3.5" (1.613 m)    Weight 225 lb 11.2 oz (102.4 kg)    BMI (Calculated) 39.35    Single Leg Stand 0 seconds            Nutrition Therapy Plan and Nutrition Goals:   Nutrition Assessments:  Nutrition Assessments - 05/16/20 0957      MEDFICTS Scores   Pre Score 84           MEDIFICTS Score Key:          ?70 Need to make dietary changes  40-70 Heart Healthy Diet         ? 40 Therapeutic Level Cholesterol Diet  Nutrition Goals Re-Evaluation:   Nutrition Goals Discharge (Final Nutrition Goals Re-Evaluation):   Psychosocial: Target Goals: Acknowledge presence or absence of significant depression and/or stress, maximize coping skills, provide positive support system. Participant is able to verbalize types and ability to use techniques and skills needed for reducing stress and depression.   Education: Depression - Provides group verbal and written instruction on the correlation between heart/lung disease and depressed mood, treatment options, and the stigmas associated with seeking treatment.   Education: Sleep Hygiene -Provides group verbal and  written instruction about how sleep can affect your health.  Define sleep hygiene, discuss sleep cycles and impact of sleep habits. Review good sleep hygiene tips.    Education: Stress and Anxiety: - Provides group verbal and written instruction about the health risks of elevated stress and causes of high stress.  Discuss the correlation between heart/lung disease and anxiety and treatment options. Review healthy ways to manage with stress and anxiety.   Initial Review & Psychosocial Screening:  Initial Psych Review & Screening - 05/12/20 0940      Initial Review   Current issues with Current Depression;Current Psychotropic Meds;Current Stress Concerns    Source of Stress Concerns Chronic Illness    Comments He does not want to divuldge about his mental health.      Family Dynamics   Good Support System? Yes    Comments He can look to his wife and sons for support.      Barriers   Psychosocial barriers to participate in program There are no identifiable barriers or psychosocial needs.;The patient should benefit from training in stress management and relaxation.;Psychosocial barriers identified (see note)      Screening Interventions   Interventions Encouraged to exercise;Program counselor consult;Provide feedback about the scores to participant;To provide support and resources with identified psychosocial needs    Expected Outcomes Short Term goal: Utilizing psychosocial counselor, staff and physician to assist with identification of specific Stressors or current issues interfering with healing process. Setting desired goal for each stressor or current issue identified.;Long Term Goal: Stressors or current issues are controlled or eliminated.;Short Term goal: Identification and review with participant of any Quality of Life or Depression concerns found by scoring the questionnaire.;Long Term goal: The participant improves quality of Life and PHQ9 Scores as seen by post scores and/or  verbalization of changes           Quality of Life Scores:  Scores of 19 and below usually indicate a poorer quality of life in these areas.  A difference of  2-3 points is a clinically meaningful difference.  A difference of 2-3 points in the total score of the Quality of Life Index has been associated with significant improvement in overall quality of life, self-image, physical symptoms, and general health in studies assessing change in quality of life.  PHQ-9: Recent Review Flowsheet Data    Depression screen [facility] 2/9 05/16/2020 06/10/2018   Decreased Interest 2 0   Down, Depressed, Hopeless 2 0   PHQ - 2 Score 4 0   Altered sleeping 3 2   Tired, decreased energy 2 2   Change in appetite 3 0   Feeling bad or failure about yourself  0 0   Trouble concentrating 3 0   Moving slowly or fidgety/restless 3 0   Suicidal thoughts 3 2    PHQ-9 Score 21 6  Difficult doing work/chores Very difficult Not difficult at all     Interpretation of Total Score  Total Score Depression Severity:  1-4 = Minimal depression, 5-9 = Mild depression, 10-14 = Moderate depression, 15-19 = Moderately severe depression, 20-27 = Severe depression   Psychosocial Evaluation and Intervention:  Psychosocial Evaluation - 05/12/20 0952      Psychosocial Evaluation & Interventions   Interventions Encouraged to exercise with the program and follow exercise prescription    Comments Patient would not divuldge about his mental health.    Expected Outcomes Short: Attend [facility] stress management education to decrease stress. Long: Maintain exercise Post [facility] to keep stress at a minimum.    Continue Psychosocial Services  Follow up required by staff           Psychosocial Re-Evaluation:   Psychosocial Discharge (Final Psychosocial Re-Evaluation):   Education: Education Goals: Education classes will be provided on a weekly basis, covering required topics. Participant will state understanding/return  demonstration of topics presented.  Learning Barriers/Preferences:  Learning Barriers/Preferences - 05/12/20 0955      Learning Barriers/Preferences   Learning Barriers None    Learning Preferences None           General Pulmonary Education Topics:  Infection Prevention: - Provides verbal and written material to individual with discussion of infection control including proper hand washing and proper equipment cleaning during exercise session.   Pulmonary Rehab from 05/16/2020 in [facility] Cardiac and Pulmonary Rehab  Date 05/12/20  Educator [facility]  Instruction Review Code 1- Verbalizes Understanding      Falls Prevention: - Provides verbal and written material to individual with discussion of falls prevention and safety.   Pulmonary Rehab from 05/16/2020 in [facility] Cardiac and Pulmonary Rehab  Date 05/12/20  Educator [facility]  Instruction Review Code 1- Verbalizes Understanding      Chronic Lung Diseases: - Group verbal and written instruction to review updates, respiratory medications, advancements in procedures and treatments. Discuss use of supplemental oxygen including available portable oxygen systems, continuous and intermittent flow rates, concentrators, personal use and safety guidelines. Review proper use of inhaler and spacers. Provide informative websites for self-education.    Energy Conservation: - Provide group verbal and written instruction for methods to conserve energy, plan and organize activities. Instruct on pacing techniques, use of adaptive equipment and posture/positioning to relieve shortness of breath.   Triggers and Exacerbations: - Group verbal and written instruction to review types of environmental triggers and ways to prevent exacerbations. Discuss weather changes, air quality and the benefits of nasal washing. Review warning signs and symptoms to help prevent infections. Discuss techniques for effective airway clearance, coughing, and vibrations.   AED/CPR: -  Group verbal and written instruction with the use of models to demonstrate the basic use of the AED with the basic ABC's of resuscitation.   Anatomy and Physiology of the Lungs: - Group verbal and written instruction with the use of models to provide basic lung anatomy and physiology related to function, structure and complications of lung disease.   Anatomy & Physiology of the Heart: - Group verbal and written instruction and models provide basic cardiac anatomy and physiology, with the coronary electrical and arterial systems. Review of Valvular disease and Heart Failure   Cardiac Medications: - Group verbal and written instruction to review commonly prescribed medications for heart disease. Reviews the medication, class of the drug, and side effects.   Other: -Provides group and verbal instruction on various topics (see comments)   Knowledge  Questionnaire Score:  Knowledge Questionnaire Score - 05/16/20 1002      Knowledge Questionnaire Score   Pre Score 10/16 Education Focus: O2 safety, Nutrition            Core Components/Risk Factors/Patient Goals at Admission:  Personal Goals and Risk Factors at Admission - 05/16/20 1003      Core Components/Risk Factors/Patient Goals on Admission    Weight Management Yes;Weight Loss    Intervention Weight Management: Develop a combined nutrition and exercise program designed to reach desired caloric intake, while maintaining appropriate intake of nutrient and fiber, sodium and fats, and appropriate energy expenditure required for the weight goal.;Weight Management: Provide education and appropriate resources to help participant work on and attain dietary goals.;Weight Management/Obesity: Establish reasonable short term and long term weight goals.    Admit Weight 225 lb 11.2 oz (102.4 kg)    Goal Weight: Short Term 220 lb (99.8 kg)    Goal Weight: Long Term 215 lb (97.5 kg)    Expected Outcomes Short Term: Continue to assess and modify  interventions until short term weight is achieved;Long Term: Adherence to nutrition and physical activity/exercise program aimed toward attainment of established weight goal;Weight Loss: Understanding of general recommendations for a balanced deficit meal plan, which promotes 1-2 lb weight loss per week and includes a negative energy balance of [phone] kcal/d;Understanding recommendations for meals to include 15-35% energy as protein, 25-35% energy from fat, 35-60% energy from carbohydrates, less than '200mg'$  of dietary cholesterol, 20-35 gm of total fiber daily;Understanding of distribution of calorie intake throughout the day with the consumption of 4-5 meals/snacks    Improve shortness of breath with ADL's Yes    Intervention Provide education, individualized exercise plan and daily activity instruction to help decrease symptoms of SOB with activities of daily living.    Expected Outcomes Short Term: Improve cardiorespiratory fitness to achieve a reduction of symptoms when performing ADLs;Long Term: Be able to perform more ADLs without symptoms or delay the onset of symptoms    Heart Failure --    Lipids Yes    Intervention Provide education and support for participant on nutrition & aerobic/resistive exercise along with prescribed medications to achieve LDL '70mg'$ , HDL >'40mg'$ .    Expected Outcomes Short Term: Participant states understanding of desired cholesterol values and is compliant with medications prescribed. Participant is following exercise prescription and nutrition guidelines.;Long Term: Cholesterol controlled with medications as prescribed, with individualized exercise RX and with personalized nutrition plan. Value goals: LDL < '70mg'$ , HDL > 40 mg.           Education:Diabetes - Individual verbal and written instruction to review signs/symptoms of diabetes, desired ranges of glucose level fasting, after meals and with exercise. Acknowledge that pre and post exercise glucose checks will be  done for 3 sessions at entry of program.   Education: Know Your Numbers and Risk Factors: -Group verbal and written instruction about important numbers in your health.  Discussion of what are risk factors and how they play a role in the disease process.  Review of Cholesterol, Blood Pressure, Diabetes, and BMI and the role they play in your overall health.   Core Components/Risk Factors/Patient Goals Review:    Core Components/Risk Factors/Patient Goals at Discharge (Final Review):    ITP Comments:  ITP Comments    Row Name 05/12/20 0957 05/12/20 1002 05/16/20 0949 05/20/20 0823 06/08/20 3086   ITP Comments Virtual Visit completed. Patient informed on EP and RD appointment and 6 Minute walk test.  Patient also informed of patient health questionnaires on My Chart. Patient Verbalizes understanding. Visit diagnosis can be found in [facility] 04/29/2020. -- Completed 6MWT and gym orientation.  Initial ITP created and sent for review to Dr. Emily Filbert, Medical Director. First full day of exercise!  Patient was oriented to gym and equipment including functions, settings, policies, and procedures.  Patient's individual exercise prescription and treatment plan were reviewed.  All starting workloads were established based on the results of the 6 minute walk test done at initial orientation visit.  The plan for exercise progression was also introduced and progression will be customized based on patient's performance and goals. 30 Day review completed. Medical Director ITP review done, changes made as directed, and signed approval by Medical Director.          Comments: 30 Day review completed. Medical Director ITP review done, changes made as directed, and signed approval by Medical Director.

## 2020-06-10 ENCOUNTER — Other Ambulatory Visit: Payer: Self-pay

## 2020-06-10 ENCOUNTER — Encounter: Payer: Medicare Other | Admitting: *Deleted

## 2020-06-10 DIAGNOSIS — R0609 Other forms of dyspnea: Secondary | ICD-10-CM

## 2020-06-10 DIAGNOSIS — I272 Pulmonary hypertension, unspecified: Secondary | ICD-10-CM

## 2020-06-10 DIAGNOSIS — I2724 Chronic thromboembolic pulmonary hypertension: Secondary | ICD-10-CM | POA: Diagnosis not present

## 2020-06-10 NOTE — Progress Notes (Signed)
Daily Session Note  Patient Details  Name: Devlyn Retter MRN: [id number] Date of Birth: 02-Sep-1941 Referring Provider:     Pulmonary Rehab from 05/16/2020 in [facility] Cardiac and Pulmonary Rehab  Referring Provider [name] MD      Encounter Date: 06/10/2020  Check In:  Session Check In - 06/10/20 0954      Check-In   Supervising physician immediately available to respond to emergencies See telemetry face sheet for immediately available ER MD    Location [facility]-Cardiac & Pulmonary Rehab    Staff Present Justin Mend RCP,RRT,BSRT;Jessica Luan Pulling, MA, RCEP, CCRP, CCET;Ireland Virrueta, RN, BSN, CCRP    Virtual Visit No    Medication changes reported     No    Fall or balance concerns reported    No    Warm-up and Cool-down Performed on first and last piece of equipment    Resistance Training Performed Yes    VAD Patient? No    PAD/SET Patient? No      Pain Assessment   Currently in Pain? No/denies              Social History   Tobacco Use  Smoking Status Former Smoker  . Packs/day: 0.30  . Years: 7.00  . Pack years: 2.10  . Types: Cigarettes  . Quit date: 99  . Years since quitting: 54.4  Smokeless Tobacco Never Used    Goals Met:  Proper associated with RPD/PD & O2 Sat Independence with exercise equipment Exercise tolerated well No report of cardiac concerns or symptoms  Goals Unmet:  Not Applicable  Comments: Pt able to follow exercise prescription today without complaint.  Will continue to monitor for progression.    Dr. Emily Filbert is Medical Director for [place] and [facility].

## 2020-06-13 ENCOUNTER — Encounter: Payer: Medicare Other | Admitting: *Deleted

## 2020-06-13 ENCOUNTER — Other Ambulatory Visit: Payer: Self-pay

## 2020-06-13 DIAGNOSIS — I272 Pulmonary hypertension, unspecified: Secondary | ICD-10-CM

## 2020-06-13 DIAGNOSIS — R0609 Other forms of dyspnea: Secondary | ICD-10-CM

## 2020-06-13 DIAGNOSIS — I2724 Chronic thromboembolic pulmonary hypertension: Secondary | ICD-10-CM | POA: Diagnosis not present

## 2020-06-13 NOTE — Progress Notes (Signed)
Daily Session Note  Patient Details  Name: Adam Knight MRN: [id number] Date of Birth: [birth year] Referring Provider:     Pulmonary Rehab from 05/16/2020 in [facility] Cardiac and Pulmonary Rehab  Referring Provider [name] MD      Encounter Date: 06/13/2020  Check In:  Session Check In - 06/13/20 0831      Check-In   Supervising physician immediately available to respond to emergencies See telemetry face sheet for immediately available ER MD    Location [facility]-Cardiac & Pulmonary Rehab    Staff Present Earlean Shawl, BS, ACSM CEP, Exercise Physiologist;Joseph Flavia Shipper;Heath Lark, RN, BSN, CCRP    Virtual Visit No    Medication changes reported     No    Fall or balance concerns reported    No    Warm-up and Cool-down Performed on first and last piece of equipment    Resistance Training Performed Yes    VAD Patient? No    PAD/SET Patient? No      Pain Assessment   Currently in Pain? No/denies              Social History   Tobacco Use  Smoking Status Former Smoker  . Packs/day: 0.30  . Years: 7.00  . Pack years: 2.10  . Types: Cigarettes  . Quit date: 105  . Years since quitting: 54.5  Smokeless Tobacco Never Used    Goals Met:  Proper associated with RPD/PD & O2 Sat Independence with exercise equipment Personal goals reviewed No report of cardiac concerns or symptoms  Goals Unmet:  Not Applicable  Comments: Pt able to follow exercise prescription today without complaint.  Will continue to monitor for progression.    Dr. Emily Filbert is Medical Director for [place] and [facility].

## 2020-06-15 ENCOUNTER — Ambulatory Visit (INDEPENDENT_AMBULATORY_CARE_PROVIDER_SITE_OTHER): Payer: Medicare Other

## 2020-06-15 ENCOUNTER — Other Ambulatory Visit: Payer: Self-pay

## 2020-06-15 ENCOUNTER — Encounter: Payer: Medicare Other | Admitting: *Deleted

## 2020-06-15 DIAGNOSIS — I2724 Chronic thromboembolic pulmonary hypertension: Secondary | ICD-10-CM | POA: Diagnosis not present

## 2020-06-15 DIAGNOSIS — Z5181 Encounter for therapeutic drug level monitoring: Secondary | ICD-10-CM

## 2020-06-15 DIAGNOSIS — I2602 Saddle embolus of pulmonary artery with acute cor pulmonale: Secondary | ICD-10-CM

## 2020-06-15 DIAGNOSIS — I272 Pulmonary hypertension, unspecified: Secondary | ICD-10-CM

## 2020-06-15 DIAGNOSIS — R0609 Other forms of dyspnea: Secondary | ICD-10-CM

## 2020-06-15 LAB — POCT INR: INR: 1.8 — AB (ref 2.0–3.0)

## 2020-06-15 NOTE — Patient Instructions (Signed)
-   START NEW warfarin dosage of 1 tablet (5 mg) every day EXCEPT 1.5 TABLETS ON MONDAYS & FRIDAYS. - Recheck in 6 weeks.

## 2020-06-15 NOTE — Progress Notes (Signed)
Daily Session Note  Patient Details  Name: Adam Knight MRN: [id number] Date of Birth: Jun 27, 1941 Referring Provider:     Pulmonary Rehab from 05/16/2020 in [facility] Cardiac and Pulmonary Rehab  Referring Provider [name] MD      Encounter Date: 06/15/2020  Check In:  Session Check In - 06/15/20 0903      Check-In   Supervising physician immediately available to respond to emergencies See telemetry face sheet for immediately available ER MD    Location [facility]-Cardiac & Pulmonary Rehab    Staff Present Nada Maclachlan, BA, ACSM CEP, Exercise Physiologist;Jessica Luan Pulling, MA, RCEP, CCRP, CCET;Rei Contee, RN, BSN, CCRP    Virtual Visit No    Medication changes reported     No    Fall or balance concerns reported    No    Warm-up and Cool-down Performed on first and last piece of equipment    Resistance Training Performed Yes    VAD Patient? No    PAD/SET Patient? No      Pain Assessment   Currently in Pain? No/denies              Social History   Tobacco Use  Smoking Status Former Smoker  . Packs/day: 0.30  . Years: 7.00  . Pack years: 2.10  . Types: Cigarettes  . Quit date: 33  . Years since quitting: 54.5  Smokeless Tobacco Never Used    Goals Met:  Proper associated with RPD/PD & O2 Sat Independence with exercise equipment Exercise tolerated well No report of cardiac concerns or symptoms  Goals Unmet:  Not Applicable  Comments: Pt able to follow exercise prescription today without complaint.  Will continue to monitor for progression.    Dr. Emily Filbert is Medical Director for [place] and [facility].

## 2020-06-17 ENCOUNTER — Encounter: Payer: Medicare Other | Admitting: *Deleted

## 2020-06-17 ENCOUNTER — Other Ambulatory Visit: Payer: Self-pay

## 2020-06-17 DIAGNOSIS — I2724 Chronic thromboembolic pulmonary hypertension: Secondary | ICD-10-CM | POA: Diagnosis not present

## 2020-06-17 NOTE — Progress Notes (Signed)
Daily Session Note  Patient Details  Name: Adam Knight MRN: [id number] Date of Birth: Dec 24, 1941 Referring Provider:     Pulmonary Rehab from 05/16/2020 in [facility] Cardiac and Pulmonary Rehab  Referring Provider [name] MD      Encounter Date: 06/17/2020  Check In:  Session Check In - 06/17/20 0827      Check-In   Supervising physician immediately available to respond to emergencies See telemetry face sheet for immediately available ER MD    Location [facility]-Cardiac & Pulmonary Rehab    Staff Present Nyoka Cowden, RN, BSN, MA;Meredith Sherryll Burger, RN BSN;Joseph Hood RCP,RRT,BSRT    Virtual Visit No    Medication changes reported     No    Fall or balance concerns reported    No    Warm-up and Cool-down Performed on first and last piece of equipment    Resistance Training Performed Yes    VAD Patient? No    PAD/SET Patient? No      Pain Assessment   Currently in Pain? No/denies              Social History   Tobacco Use  Smoking Status Former Smoker  . Packs/day: 0.30  . Years: 7.00  . Pack years: 2.10  . Types: Cigarettes  . Quit date: 56  . Years since quitting: 54.5  Smokeless Tobacco Never Used    Goals Met:  Independence with exercise equipment Exercise tolerated well No report of cardiac concerns or symptoms Strength training completed today  Goals Unmet:  Not Applicable  Comments: Pt able to follow exercise prescription today without complaint.  Will continue to monitor for progression.   Dr. Emily Filbert is Medical Director for [place] and [facility].

## 2020-06-20 ENCOUNTER — Encounter: Payer: Medicare Other | Admitting: *Deleted

## 2020-06-20 ENCOUNTER — Other Ambulatory Visit: Payer: Self-pay

## 2020-06-20 DIAGNOSIS — I2724 Chronic thromboembolic pulmonary hypertension: Secondary | ICD-10-CM | POA: Diagnosis not present

## 2020-06-20 DIAGNOSIS — I272 Pulmonary hypertension, unspecified: Secondary | ICD-10-CM

## 2020-06-20 NOTE — Progress Notes (Signed)
Daily Session Note  Patient Details  Name: Adam Knight MRN: [id number] Date of Birth: 07/15/1941 Referring Provider:     Pulmonary Rehab from 05/16/2020 in [facility] Cardiac and Pulmonary Rehab  Referring Provider Ford, Hubert MD      Encounter Date: 06/20/2020  Check In:  Session Check In - 06/20/20 0823      Check-In   Supervising physician immediately available to respond to emergencies See telemetry face sheet for immediately available ER MD    Location [facility]-Cardiac & Pulmonary Rehab    Staff Present  , RN, BSN, CCRP;Amanda Sommer, BA, ACSM CEP, Exercise Physiologist;Joseph Hood RCP,RRT,BSRT    Virtual Visit No    Medication changes reported     No    Fall or balance concerns reported    No    Warm-up and Cool-down Performed on first and last piece of equipment    Resistance Training Performed Yes    VAD Patient? No    PAD/SET Patient? No      Pain Assessment   Currently in Pain? No/denies              Social History   Tobacco Use  Smoking Status Former Smoker  . Packs/day: 0.30  . Years: 7.00  . Pack years: 2.10  . Types: Cigarettes  . Quit date: 1967  . Years since quitting: 54.5  Smokeless Tobacco Never Used    Goals Met:  Proper associated with RPD/PD & O2 Sat Independence with exercise equipment Exercise tolerated well No report of cardiac concerns or symptoms  Goals Unmet:  Not Applicable  Comments: Pt able to follow exercise prescription today without complaint.  Will continue to monitor for progression.    Dr. Mark Miller is Medical Director for [facility] and [facility]. 

## 2020-06-22 DIAGNOSIS — Z85828 Personal history of other malignant neoplasm of skin: Secondary | ICD-10-CM | POA: Insufficient documentation

## 2020-06-23 ENCOUNTER — Ambulatory Visit: Payer: Medicare Other

## 2020-06-29 ENCOUNTER — Encounter: Payer: Medicare Other | Attending: Internal Medicine

## 2020-06-29 DIAGNOSIS — I2699 Other pulmonary embolism without acute cor pulmonale: Secondary | ICD-10-CM | POA: Insufficient documentation

## 2020-06-29 DIAGNOSIS — Z79899 Other long term (current) drug therapy: Secondary | ICD-10-CM | POA: Insufficient documentation

## 2020-06-29 DIAGNOSIS — G4733 Obstructive sleep apnea (adult) (pediatric): Secondary | ICD-10-CM | POA: Insufficient documentation

## 2020-06-29 DIAGNOSIS — Z9989 Dependence on other enabling machines and devices: Secondary | ICD-10-CM | POA: Insufficient documentation

## 2020-06-29 DIAGNOSIS — I2724 Chronic thromboembolic pulmonary hypertension: Secondary | ICD-10-CM | POA: Insufficient documentation

## 2020-06-29 DIAGNOSIS — Z7901 Long term (current) use of anticoagulants: Secondary | ICD-10-CM | POA: Insufficient documentation

## 2020-06-29 DIAGNOSIS — R7303 Prediabetes: Secondary | ICD-10-CM | POA: Insufficient documentation

## 2020-06-29 DIAGNOSIS — Z87891 Personal history of nicotine dependence: Secondary | ICD-10-CM | POA: Insufficient documentation

## 2020-06-29 DIAGNOSIS — E785 Hyperlipidemia, unspecified: Secondary | ICD-10-CM | POA: Insufficient documentation

## 2020-07-01 ENCOUNTER — Other Ambulatory Visit: Payer: Self-pay

## 2020-07-01 ENCOUNTER — Encounter: Payer: Medicare Other | Admitting: *Deleted

## 2020-07-01 DIAGNOSIS — Z9989 Dependence on other enabling machines and devices: Secondary | ICD-10-CM | POA: Diagnosis not present

## 2020-07-01 DIAGNOSIS — G4733 Obstructive sleep apnea (adult) (pediatric): Secondary | ICD-10-CM | POA: Diagnosis not present

## 2020-07-01 DIAGNOSIS — E785 Hyperlipidemia, unspecified: Secondary | ICD-10-CM | POA: Diagnosis not present

## 2020-07-01 DIAGNOSIS — Z79899 Other long term (current) drug therapy: Secondary | ICD-10-CM | POA: Diagnosis not present

## 2020-07-01 DIAGNOSIS — Z7901 Long term (current) use of anticoagulants: Secondary | ICD-10-CM | POA: Diagnosis not present

## 2020-07-01 DIAGNOSIS — I2724 Chronic thromboembolic pulmonary hypertension: Secondary | ICD-10-CM

## 2020-07-01 DIAGNOSIS — R7303 Prediabetes: Secondary | ICD-10-CM | POA: Diagnosis not present

## 2020-07-01 DIAGNOSIS — I2699 Other pulmonary embolism without acute cor pulmonale: Secondary | ICD-10-CM | POA: Diagnosis not present

## 2020-07-01 DIAGNOSIS — Z87891 Personal history of nicotine dependence: Secondary | ICD-10-CM | POA: Diagnosis not present

## 2020-07-01 NOTE — Progress Notes (Signed)
Daily Session Note  Patient Details  Name: Adam Knight MRN: [id number] Date of Birth: September 14, 1941 Referring Provider:     Pulmonary Rehab from 05/16/2020 in [facility] Cardiac and Pulmonary Rehab  Referring Provider [name] MD      Encounter Date: 07/01/2020  Check In:  Session Check In - 07/01/20 0817      Check-In   Supervising physician immediately available to respond to emergencies See telemetry face sheet for immediately available ER MD    Staff Present Nyoka Cowden, RN, BSN, [facility] Nick, MA, RCEP, CCRP, CCET    Virtual Visit No    Medication changes reported     No    Fall or balance concerns reported    No    Tobacco Cessation No Change    Warm-up and Cool-down Performed on first and last piece of equipment    VAD Patient? No    PAD/SET Patient? No      Pain Assessment   Currently in Pain? No/denies              Social History   Tobacco Use  Smoking Status Former Smoker  . Packs/day: 0.30  . Years: 7.00  . Pack years: 2.10  . Types: Cigarettes  . Quit date: 72  . Years since quitting: 54.5  Smokeless Tobacco Never Used    Goals Met:  Independence with exercise equipment Exercise tolerated well No report of cardiac concerns or symptoms Strength training completed today  Goals Unmet:  Not Applicable  Comments: Pt able to follow exercise prescription today without complaint.  Will continue to monitor for progression.   Dr. Emily Filbert is Medical Director for [place] and [facility].

## 2020-07-04 ENCOUNTER — Other Ambulatory Visit: Payer: Self-pay

## 2020-07-04 ENCOUNTER — Encounter: Payer: Medicare Other | Admitting: *Deleted

## 2020-07-04 DIAGNOSIS — I272 Pulmonary hypertension, unspecified: Secondary | ICD-10-CM

## 2020-07-04 DIAGNOSIS — I2724 Chronic thromboembolic pulmonary hypertension: Secondary | ICD-10-CM | POA: Diagnosis not present

## 2020-07-04 NOTE — Progress Notes (Signed)
Daily Session Note  Patient Details  Name: Adam Knight MRN: [id number] Date of Birth: 10-05-1941 Referring Provider:     Pulmonary Rehab from 05/16/2020 in [facility] Cardiac and Pulmonary Rehab  Referring Provider [name] MD      Encounter Date: 07/04/2020  Check In:  Session Check In - 07/04/20 0815      Check-In   Supervising physician immediately available to respond to emergencies See telemetry face sheet for immediately available ER MD    Location [facility]-Cardiac & Pulmonary Rehab    Staff Present Heath Lark, RN, BSN, Laveda Norman, BS, ACSM CEP, Exercise Physiologist;Joseph Tessie Fass RCP,RRT,BSRT    Virtual Visit No    Medication changes reported     No    Fall or balance concerns reported    No    Warm-up and Cool-down Performed on first and last piece of equipment    Resistance Training Performed Yes    VAD Patient? No    PAD/SET Patient? No      Pain Assessment   Currently in Pain? No/denies              Social History   Tobacco Use  Smoking Status Former Smoker  . Packs/day: 0.30  . Years: 7.00  . Pack years: 2.10  . Types: Cigarettes  . Quit date: 29  . Years since quitting: 54.5  Smokeless Tobacco Never Used    Goals Met:  Proper associated with RPD/PD & O2 Sat Independence with exercise equipment Exercise tolerated well No report of cardiac concerns or symptoms  Goals Unmet:  Not Applicable  Comments: Pt able to follow exercise prescription today without complaint.  Will continue to monitor for progression.    Dr. Emily Filbert is Medical Director for [place] and [facility].

## 2020-07-06 ENCOUNTER — Other Ambulatory Visit: Payer: Self-pay

## 2020-07-06 ENCOUNTER — Encounter: Payer: Self-pay | Admitting: *Deleted

## 2020-07-06 ENCOUNTER — Encounter: Payer: Medicare Other | Admitting: *Deleted

## 2020-07-06 DIAGNOSIS — R0609 Other forms of dyspnea: Secondary | ICD-10-CM

## 2020-07-06 DIAGNOSIS — I272 Pulmonary hypertension, unspecified: Secondary | ICD-10-CM

## 2020-07-06 DIAGNOSIS — I2724 Chronic thromboembolic pulmonary hypertension: Secondary | ICD-10-CM | POA: Diagnosis not present

## 2020-07-06 NOTE — Progress Notes (Signed)
Daily Session Note  Patient Details  Name: Adam Knight MRN: [id number] Date of Birth: 1941/06/09 Referring Provider:     Pulmonary Rehab from 05/16/2020 in [facility] Cardiac and Pulmonary Rehab  Referring Provider [name] MD      Encounter Date: 07/06/2020  Check In:  Session Check In - 07/06/20 0826      Check-In   Supervising physician immediately available to respond to emergencies See telemetry face sheet for immediately available ER MD    Location [facility]-Cardiac & Pulmonary Rehab    Staff Present Heath Lark, RN, BSN, CCRP;Joseph Foy [place], [place], ACSM CEP, Exercise Physiologist    Virtual Visit No    Medication changes reported     No    Fall or balance concerns reported    No    Warm-up and Cool-down Performed on first and last piece of equipment    Resistance Training Performed Yes    VAD Patient? No    PAD/SET Patient? No      Pain Assessment   Currently in Pain? No/denies              Social History   Tobacco Use  Smoking Status Former Smoker  . Packs/day: 0.30  . Years: 7.00  . Pack years: 2.10  . Types: Cigarettes  . Quit date: 24  . Years since quitting: 54.5  Smokeless Tobacco Never Used    Goals Met:  Proper associated with RPD/PD & O2 Sat Independence with exercise equipment Exercise tolerated well No report of cardiac concerns or symptoms  Goals Unmet:  Not Applicable  Comments: Pt able to follow exercise prescription today without complaint.  Will continue to monitor for progression.    Dr. Emily Filbert is Medical Director for [place] and [facility].

## 2020-07-06 NOTE — Progress Notes (Signed)
Pulmonary Individual Treatment Plan  Patient Details  Name: Adam Knight MRN: [id number] Date of Birth: 1941-12-21 Referring Provider:     Pulmonary Rehab from 05/16/2020 in [facility] Cardiac and Pulmonary Rehab  Referring Provider [name] MD      Initial Encounter Date:    Pulmonary Rehab from 05/16/2020 in [facility] Cardiac and Pulmonary Rehab  Date 05/16/20      Visit Diagnosis: Pulmonary hypertension ([place])  Patient's Home Medications on Admission:  Current Outpatient Medications:  .  albuterol (PROVENTIL HFA;VENTOLIN HFA) 108 (90 Base) MCG/ACT inhaler, Inhale 2 puffs into the lungs every 6 (six) hours as needed for wheezing or shortness of breath., Disp: 1 Inhaler, Rfl: 2 .  B Complex-C (SUPER B COMPLEX PO), Take by mouth daily., Disp: , Rfl:  .  busPIRone (BUSPAR) 5 MG tablet, Take 5 mg by mouth 2 (two) times daily., Disp: , Rfl:  .  Calcium Carbonate Antacid (CALCIUM CARBONATE, DOSED IN MG ELEMENTAL CALCIUM,) 1250 MG/5ML SUSP, Take by mouth., Disp: , Rfl:  .  Cholecalciferol (VITAMIN D) 2000 UNITS tablet, Take 2,000 Units by mouth daily., Disp: , Rfl:  .  finasteride (PROSCAR) 5 MG tablet, Take 5 mg by mouth daily. , Disp: , Rfl:  .  potassium chloride (KLOR-CON) 10 MEQ tablet, Take 40 mEq by mouth daily. , Disp: , Rfl:  .  torsemide (DEMADEX) 20 MG tablet, Take 2 tablets (40 mg total) by mouth daily., Disp: 180 tablet, Rfl: 1 .  warfarin (COUMADIN) 5 MG tablet, TAKE 1 TABLET DAILY OR TAKE AS DIRECTED BY THE ANTICOAGULATION CLINIC., Disp: 90 tablet, Rfl: 0  Past Medical History: Past Medical History:  Diagnosis Date  . Actinic keratosis   . 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 Basal cell carcinoma 04/18/2020   Right mid upper forehead  . 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 Dyslipidemia   . Edema   . Gastric ulcer   . OSA on CPAP   . Prediabetes   . Pulmonary embolism  ([place])   . Seasonal allergies     Tobacco Use: Social History   Tobacco Use  Smoking Status Former Smoker  . Packs/day: 0.30  . Years: 7.00  . Pack years: 2.10  . Types: Cigarettes  . Quit date: 64  . Years since quitting: 54.5  Smokeless Tobacco Never Used    Labs: Recent Review Flowsheet Data   There is no flowsheet data to display.      Pulmonary Assessment Scores:  Pulmonary Assessment Scores    Row Name 05/16/20 1005         ADL UCSD   ADL Phase Entry     SOB Score total 28     Rest 0     Walk 1     Stairs 3     Bath 0     Dress 1     Shop 1       CAT Score   CAT Score 11       mMRC Score   mMRC Score 2            UCSD: Self-administered rating of dyspnea associated with activities of daily living (ADLs) 6-point scale (0 = "not at all" to 5 = "maximal or unable to do because of breathlessness")  Scoring Scores range from 0 to 120.  Minimally important difference is 5 units  CAT: CAT can identify the health impairment of  COPD patients and is better correlated with disease progression.  CAT has a scoring range of zero to 40. The CAT score is classified into four groups of low (less than 10), medium (10 - 20), high (21-30) and very high (31-40) based on the impact level of disease on health status. A CAT score over 10 suggests significant symptoms.  A worsening CAT score could be explained by an exacerbation, poor medication adherence, poor inhaler technique, or progression of COPD or comorbid conditions.  CAT MCID is 2 points  mMRC: mMRC (Modified Medical Research Council) Dyspnea Scale is used to assess the degree of baseline functional disability in patients of respiratory disease due to dyspnea. No minimal important difference is established. A decrease in score of 1 point or greater is considered a positive change.   Pulmonary Function Assessment:  Pulmonary Function Assessment - 05/12/20 0939      Breath   Shortness of Breath Yes;Limiting  activity           Exercise Target Goals: Exercise Program Goal: Individual exercise prescription set using results from initial 6 min walk test and THRR while considering  patient's activity barriers and safety.   Exercise Prescription Goal: Initial exercise prescription builds to 30-45 minutes a day of aerobic activity, 2-3 days per week.  Home exercise guidelines will be given to patient during program as part of exercise prescription that the participant will acknowledge.  Education: Aerobic Exercise & Resistance Training: - Gives group verbal and written instruction on the various components of exercise. Focuses on aerobic and resistive training programs and the benefits of this training and how to safely progress through these programs..   Education: Exercise & Equipment Safety: - Individual verbal instruction and demonstration of equipment use and safety with use of the equipment.   Pulmonary Rehab from 07/06/2020 in [facility] Cardiac and Pulmonary Rehab  Date 05/12/20  Educator [facility]  Instruction Review Code 1- Verbalizes Understanding      Education: Exercise Physiology & General Exercise Guidelines: - Group verbal and written instruction with models to review the exercise physiology of the cardiovascular system and associated critical values. Provides general exercise guidelines with specific guidelines to those with heart or lung disease.    Pulmonary Rehab from 07/06/2020 in [facility] Cardiac and Pulmonary Rehab  Date 07/06/20  Educator [facility]  Instruction Review Code 1- Verbalizes Understanding      Education: Flexibility, Balance, Mind/Body Relaxation: Provides group verbal/written instruction on the benefits of flexibility and balance training, including mind/body exercise modes such as yoga, pilates and tai chi.  Demonstration and skill practice provided.   Activity Barriers & Risk Stratification:  Activity Barriers & Cardiac Risk Stratification - 05/16/20 0954      Activity  Barriers & Cardiac Risk Stratification   Activity Barriers Deconditioning;Arthritis;Muscular Weakness;Shortness of Breath;History of Falls;Balance Concerns;Joint Problems           6 Minute Walk:  6 Minute Walk    Row Name 05/16/20 0950         6 Minute Walk   Phase Initial     Distance 860 feet     Walk Time 6 minutes     # of Rest Breaks 0     MPH 1.63     METS 1.15     RPE 13     Perceived Dyspnea  3     VO2 Peak 4.02     Symptoms Yes (comment)     Comments SOB     Resting HR  79 bpm     Resting BP 124/66     Resting Oxygen Saturation  95 %     Exercise Oxygen Saturation  during 6 min walk 92 %     Max Ex. HR 110 bpm     Max Ex. BP 136/70     2 Minute Post BP 124/68       Interval HR   1 Minute HR 98     2 Minute HR 100     3 Minute HR 108     4 Minute HR 107     5 Minute HR 109     6 Minute HR 110     2 Minute Post HR 94     Interval Heart Rate? Yes       Interval Oxygen   Interval Oxygen? Yes     Baseline Oxygen Saturation % 95 %     1 Minute Oxygen Saturation % 93 %     1 Minute Liters of Oxygen 0 L  Room Air     2 Minute Oxygen Saturation % 92 %     2 Minute Liters of Oxygen 0 L     3 Minute Oxygen Saturation % 93 %     3 Minute Liters of Oxygen 0 L     4 Minute Oxygen Saturation % 94 %     4 Minute Liters of Oxygen 0 L     5 Minute Oxygen Saturation % 94 %     5 Minute Liters of Oxygen 0 L     6 Minute Oxygen Saturation % 95 %     6 Minute Liters of Oxygen 0 L     2 Minute Post Oxygen Saturation % 96 %     2 Minute Post Liters of Oxygen 0 L           Oxygen Initial Assessment:  Oxygen Initial Assessment - 05/12/20 0939      Home Oxygen   Home Oxygen Device None    Sleep Oxygen Prescription CPAP    Liters per minute 0    Home Exercise Oxygen Prescription None    Home at Rest Exercise Oxygen Prescription None    Compliance with Home Oxygen Use Yes      Initial 6 min Walk   Oxygen Used None      Program Oxygen Prescription   Program  Oxygen Prescription None      Intervention   Short Term Goals To learn and exhibit compliance with exercise, home and travel O2 prescription;To learn and understand importance of monitoring SPO2 with pulse oximeter and demonstrate accurate use of the pulse oximeter.;To learn and understand importance of maintaining oxygen saturations>88%;To learn and demonstrate proper pursed lip breathing techniques or other breathing techniques.;To learn and demonstrate proper use of respiratory medications    Long  Term Goals Exhibits compliance with exercise, home and travel O2 prescription;Verbalizes importance of monitoring SPO2 with pulse oximeter and return demonstration;Maintenance of O2 saturations>88%;Exhibits proper breathing techniques, such as pursed lip breathing or other method taught during program session;Compliance with respiratory medication;Demonstrates proper use of MDI's           Oxygen Re-Evaluation:  Oxygen Re-Evaluation    Row Name 05/20/20 0824 06/13/20 0823           Program Oxygen Prescription   Program Oxygen Prescription None None        Home Oxygen   Home Oxygen Device None None  Sleep Oxygen Prescription CPAP CPAP      Liters per minute 0 --      Home Exercise Oxygen Prescription None None      Home at Rest Exercise Oxygen Prescription None None      Compliance with Home Oxygen Use Yes Yes        Goals/Expected Outcomes   Short Term Goals To learn and demonstrate proper pursed lip breathing techniques or other breathing techniques. To learn and demonstrate proper pursed lip breathing techniques or other breathing techniques.      Long  Term Goals Exhibits proper breathing techniques, such as pursed lip breathing or other method taught during program session Exhibits proper breathing techniques, such as pursed lip breathing or other method taught during program session      Comments Reviewed PLB technique with pt.  Talked about how it works and it's importance  in maintaining their exercise saturations. Reviewed reasosn for PLB.  O2 has been above 88 with exercise.      Goals/Expected Outcomes Short: Become more profiecient at using PLB.   Long: Become independent at using PLB. Short: monitor oxygen at home and use PLB Long:  become independent at using PLB             Oxygen Discharge (Final Oxygen Re-Evaluation):  Oxygen Re-Evaluation - 06/13/20 0823      Program Oxygen Prescription   Program Oxygen Prescription None      Home Oxygen   Home Oxygen Device None    Sleep Oxygen Prescription CPAP    Home Exercise Oxygen Prescription None    Home at Rest Exercise Oxygen Prescription None    Compliance with Home Oxygen Use Yes      Goals/Expected Outcomes   Short Term Goals To learn and demonstrate proper pursed lip breathing techniques or other breathing techniques.    Long  Term Goals Exhibits proper breathing techniques, such as pursed lip breathing or other method taught during program session    Comments Reviewed reasosn for PLB.  O2 has been above 88 with exercise.    Goals/Expected Outcomes Short: monitor oxygen at home and use PLB Long:  become independent at using PLB           Initial Exercise Prescription:  Initial Exercise Prescription - 05/16/20 0900      Date of Initial Exercise RX and Referring Provider   Date 05/16/20    Referring Provider [name] MD      Treadmill   MPH 1.2    Grade 0.5    Minutes 15    METs 2      Recumbant Bike   Level 1    RPM 50    Watts 11    Minutes 15    METs 2      NuStep   Level 1    SPM 80    Minutes 15    METs 2      T5 Nustep   Level 1    SPM 80    Minutes 15    METs 2      Prescription Details   Frequency (times per week) 2    Duration Progress to 30 minutes of continuous aerobic without signs/symptoms of physical distress      Intensity   THRR 40-80% of Max Heartrate 104-129    Ratings of Perceived Exertion 11-13    Perceived Dyspnea 0-4      Progression    Progression Continue to progress workloads to  maintain intensity without signs/symptoms of physical distress.      Resistance Training   Training Prescription Yes    Weight 3 lb    Reps 10-15           Perform Capillary Blood Glucose checks as needed.  Exercise Prescription Changes:  Exercise Prescription Changes    Row Name 05/16/20 0900 05/24/20 1600 06/07/20 1500 06/21/20 1300 07/05/20 1300     Response to Exercise   Blood Pressure (Admit) 124/66 126/80 104/62 146/84 142/80   Blood Pressure (Exercise) 136/70 162/78 142/60 144/72 162/78   Blood Pressure (Exit) 124/68 126/64 118/70 124/70 130/70   Heart Rate (Admit) 79 bpm 100 bpm 82 bpm 89 bpm 84 bpm   Heart Rate (Exercise) 110 bpm 113 bpm 90 bpm 90 bpm 96 bpm   Heart Rate (Exit) 93 bpm 98 bpm 94 bpm 93 bpm 92 bpm   Oxygen Saturation (Admit) 95 % 98 % 93 % 93 % 94 %   Oxygen Saturation (Exercise) 92 % 95 % 94 % 93 % 93 %   Oxygen Saturation (Exit) 95 % 93 % 93 % 95 % 92 %   Rating of Perceived Exertion (Exercise) '13 13 12 12 13   '$ Perceived Dyspnea (Exercise) 3 1 -- 1 1   Symptoms SOB -- -- SOB --   Comments walk test results first full day of exercise -- -- --   Duration -- Progress to 30 minutes of  aerobic without signs/symptoms of physical distress Continue with 30 min of aerobic exercise without signs/symptoms of physical distress. Continue with 30 min of aerobic exercise without signs/symptoms of physical distress. Continue with 30 min of aerobic exercise without signs/symptoms of physical distress.   Intensity -- THRR unchanged THRR unchanged THRR unchanged THRR unchanged     Progression   Progression -- Continue to progress workloads to maintain intensity without signs/symptoms of physical distress. Continue to progress workloads to maintain intensity without signs/symptoms of physical distress. Continue to progress workloads to maintain intensity without signs/symptoms of physical distress. Continue to progress  workloads to maintain intensity without signs/symptoms of physical distress.   Average METs -- 2 2.43 1.67 2     Resistance Training   Training Prescription -- Yes Yes Yes Yes   Weight -- 3 lb 4 lb 4 lb 4 lb   Reps -- 10-15 10-15 10-15 10-15     Interval Training   Interval Training -- No No No No     Treadmill   MPH -- 1.2 -- 1.4 1.4   Grade -- 0.5 -- 0.5 0.5   Minutes -- 15 -- 15 15   METs -- 2 -- 2.17 2.17     Recumbant Bike   Level -- -- 1 1 --   RPM -- -- 50 -- --   Minutes -- -- 15 15 --   METs -- -- 2.43 -- --     NuStep   Level -- -- 2 2 --   SPM -- -- 80 -- --   Minutes -- -- 15 15 --   METs -- -- -- 1.4 --     Arm Ergometer   Level -- -- -- 1 --   Minutes -- -- -- 15 --   METs -- -- -- 1.3 --     T5 Nustep   Level -- 1 -- -- 2   SPM -- -- -- -- 80   Minutes -- 15 -- -- 15   METs -- -- -- --  1.8     Home Exercise Plan   Plans to continue exercise at -- -- -- Home (comment)  walking Home (comment)  walking   Frequency -- -- -- Add 2 additional days to program exercise sessions. Add 2 additional days to program exercise sessions.   Initial Home Exercises Provided -- -- -- 06/08/20 06/08/20          Exercise Comments:  Exercise Comments    Row Name 05/20/20 [phone]           Exercise Comments First full day of exercise!  Patient was oriented to gym and equipment including functions, settings, policies, and procedures.  Patient's individual exercise prescription and treatment plan were reviewed.  All starting workloads were established based on the results of the 6 minute walk test done at initial orientation visit.  The plan for exercise progression was also introduced and progression will be customized based on patient's performance and goals.              Exercise Goals and Review:  Exercise Goals    Row Name 05/16/20 0956             Exercise Goals   Increase Physical Activity Yes       Intervention Provide advice, education, support and  counseling about physical activity/exercise needs.;Develop an individualized exercise prescription for aerobic and resistive training based on initial evaluation findings, risk stratification, comorbidities and participant's personal goals.       Expected Outcomes Long Term: Add in home exercise to make exercise part of routine and to increase amount of physical activity.;Short Term: Attend rehab on a regular basis to increase amount of physical activity.;Long Term: Exercising regularly at least 3-5 days a week.       Increase Strength and Stamina Yes       Intervention Provide advice, education, support and counseling about physical activity/exercise needs.;Develop an individualized exercise prescription for aerobic and resistive training based on initial evaluation findings, risk stratification, comorbidities and participant's personal goals.       Expected Outcomes Short Term: Increase workloads from initial exercise prescription for resistance, speed, and METs.;Short Term: Perform resistance training exercises routinely during rehab and add in resistance training at home;Long Term: Improve cardiorespiratory fitness, muscular endurance and strength as measured by increased METs and functional capacity (6MWT)       Able to understand and use rate of perceived exertion (RPE) scale Yes       Intervention Provide education and explanation on how to use RPE scale       Expected Outcomes Short Term: Able to use RPE daily in rehab to express subjective intensity level;Long Term:  Able to use RPE to guide intensity level when exercising independently       Able to understand and use Dyspnea scale Yes       Intervention Provide education and explanation on how to use Dyspnea scale       Expected Outcomes Long Term: Able to use Dyspnea scale to guide intensity level when exercising independently;Short Term: Able to use Dyspnea scale daily in rehab to express subjective sense of shortness of breath during exertion        Knowledge and understanding of Target Heart Rate Range (THRR) Yes       Intervention Provide education and explanation of THRR including how the numbers were predicted and where they are located for reference       Expected Outcomes Short Term: Able to state/look up THRR;Short Term:  Able to use daily as guideline for intensity in rehab;Long Term: Able to use THRR to govern intensity when exercising independently       Able to check pulse independently Yes       Intervention Provide education and demonstration on how to check pulse in carotid and radial arteries.;Review the importance of being able to check your own pulse for safety during independent exercise       Expected Outcomes Short Term: Able to explain why pulse checking is important during independent exercise;Long Term: Able to check pulse independently and accurately       Understanding of Exercise Prescription Yes       Intervention Provide education, explanation, and written materials on patient's individual exercise prescription       Expected Outcomes Short Term: Able to explain program exercise prescription;Long Term: Able to explain home exercise prescription to exercise independently              Exercise Goals Re-Evaluation :  Exercise Goals Re-Evaluation    Row Name 05/20/20 2563 05/24/20 1559 06/07/20 1506 06/08/20 0851 06/21/20 1351     Exercise Goal Re-Evaluation   Exercise Goals Review Able to understand and use rate of perceived exertion (RPE) scale;Able to understand and use Dyspnea scale;Knowledge and understanding of Target Heart Rate Range (THRR);Understanding of Exercise Prescription Increase Physical Activity;Increase Strength and Stamina;Understanding of Exercise Prescription Increase Physical Activity;Increase Strength and Stamina;Understanding of Exercise Prescription Increase Physical Activity;Increase Strength and Stamina;Understanding of Exercise Prescription Increase Physical Activity;Increase Strength  and Stamina;Understanding of Exercise Prescription   Comments Reviewed RPE and dyspnea scales, THR and program prescription with pt today.  Pt voiced understanding and was given a copy of goals to take home. Adam Knight has completed his first full day of exercise.  We will continue to monitor his progress. Adam Knight has increased workloads on T4 and weights to 4 lb for strength work. Reviewed home exercise with pt today.  Pt plans to walk/consider going to [facility] for exercise.  Reviewed THR, pulse, RPE, sign and symptoms, pulse oximetery and when to call 911 or MD.  Also discussed weather considerations and indoor options.  Pt voiced understanding. Adam Knight has been doing well in rehab.  He is up to level 2 on the T5 Nustep.  We will continue to monitor his progres.   Expected Outcomes Short: Use RPE daily to regulate intensity. Long: Follow program prescription in THR. Short: Continue to attend rehab regularly Long: Continue follow program prescription. Short : exercise consistently Long: continue to improve MET level Short:  add in one day of exercise outside program sessions Short: Start to increase seated equipment.  Long: Continue to improve stamina.   [place] Name 07/05/20 1356             Exercise Goal Re-Evaluation   Exercise Goals Review Increase Physical Activity;Increase Strength and Stamina;Understanding of Exercise Prescription       Comments Adam Knight continues to tolerate exercise well.  He attended  consistently in June  but missed some sessions first part of July.       Expected Outcomes Short: get back to consistent attendance Long: increase stamina and MET level              Discharge Exercise Prescription (Final Exercise Prescription Changes):  Exercise Prescription Changes - 07/05/20 1300      Response to Exercise   Blood Pressure (Admit) 142/80    Blood Pressure (Exercise) 162/78    Blood Pressure (Exit) 130/70  Heart Rate (Admit) 84 bpm    Heart Rate (Exercise) 96 bpm    Heart Rate  (Exit) 92 bpm    Oxygen Saturation (Admit) 94 %    Oxygen Saturation (Exercise) 93 %    Oxygen Saturation (Exit) 92 %    Rating of Perceived Exertion (Exercise) 13    Perceived Dyspnea (Exercise) 1    Duration Continue with 30 min of aerobic exercise without signs/symptoms of physical distress.    Intensity THRR unchanged      Progression   Progression Continue to progress workloads to maintain intensity without signs/symptoms of physical distress.    Average METs 2      Resistance Training   Training Prescription Yes    Weight 4 lb    Reps 10-15      Interval Training   Interval Training No      Treadmill   MPH 1.4    Grade 0.5    Minutes 15    METs 2.17      T5 Nustep   Level 2    SPM 80    Minutes 15    METs 1.8      Home Exercise Plan   Plans to continue exercise at Home (comment)   walking   Frequency Add 2 additional days to program exercise sessions.    Initial Home Exercises Provided 06/08/20           Nutrition:  Target Goals: Understanding of nutrition guidelines, daily intake of sodium '1500mg'$ , cholesterol '200mg'$ , calories 30% from fat and 7% or less from saturated fats, daily to have 5 or more servings of fruits and vegetables.  Education: Controlling Sodium/Reading Food Labels -Group verbal and written material supporting the discussion of sodium use in heart healthy nutrition. Review and explanation with models, verbal and written materials for utilization of the food label.   Education: General Nutrition Guidelines/Fats and Fiber: -Group instruction provided by verbal, written material, models and posters to present the general guidelines for heart healthy nutrition. Gives an explanation and review of dietary fats and fiber.   Biometrics:  Pre Biometrics - 05/16/20 0956      Pre Biometrics   Height 5' 3.5" (1.613 m)    Weight 225 lb 11.2 oz (102.4 kg)    BMI (Calculated) 39.35    Single Leg Stand 0 seconds            Nutrition Therapy  Plan and Nutrition Goals:  Nutrition Therapy & Goals - 06/15/20 0811      Nutrition Therapy   Diet heart healthy, low sodium    Drug/Food Interactions Coumadin/Vit K    Protein (specify units) 75-80g    Fiber 30 grams    Whole Grain Foods 3 servings    Saturated Fats 12 max. grams    Fruits and Vegetables 5 servings/day    Sodium 1.5 grams      Personal Nutrition Goals   Nutrition Goal ST: none at this time LT: increase longevity, increase functional years    Comments B: ear of corn L: [place] burger for lunch, no bread with pico de gallo. D: Chili. Pt eats lean proteins and lots of vegetables and fruit. Pt also eats bread, but feels it clogs him up so is now avoiding it. Discussed whole grains, natural laxatives like prunes and prune juice (pt rpeorts doing so), and heart healthy eating. Pt would not like to make any new changes at this time.      Intervention Plan  Intervention Prescribe, educate and counsel regarding individualized specific dietary modifications aiming towards targeted core components such as weight, hypertension, lipid management, diabetes, heart failure and other comorbidities.;Nutrition handout(s) given to patient.    Expected Outcomes Short Term Goal: Understand basic principles of dietary content, such as calories, fat, sodium, cholesterol and nutrients.;Long Term Goal: Adherence to prescribed nutrition plan.;Short Term Goal: A plan has been developed with personal nutrition goals set during dietitian appointment.           Nutrition Assessments:  Nutrition Assessments - 05/16/20 0957      MEDFICTS Scores   Pre Score 84           MEDIFICTS Score Key:          ?70 Need to make dietary changes          40-70 Heart Healthy Diet         ? 40 Therapeutic Level Cholesterol Diet  Nutrition Goals Re-Evaluation:   Nutrition Goals Discharge (Final Nutrition Goals Re-Evaluation):   Psychosocial: Target Goals: Acknowledge presence or absence of significant  depression and/or stress, maximize coping skills, provide positive support system. Participant is able to verbalize types and ability to use techniques and skills needed for reducing stress and depression.   Education: Depression - Provides group verbal and written instruction on the correlation between heart/lung disease and depressed mood, treatment options, and the stigmas associated with seeking treatment.   Education: Sleep Hygiene -Provides group verbal and written instruction about how sleep can affect your health.  Define sleep hygiene, discuss sleep cycles and impact of sleep habits. Review good sleep hygiene tips.    Education: Stress and Anxiety: - Provides group verbal and written instruction about the health risks of elevated stress and causes of high stress.  Discuss the correlation between heart/lung disease and anxiety and treatment options. Review healthy ways to manage with stress and anxiety.   Initial Review & Psychosocial Screening:  Initial Psych Review & Screening - 05/12/20 0940      Initial Review   Current issues with Current Depression;Current Psychotropic Meds;Current Stress Concerns    Source of Stress Concerns Chronic Illness    Comments He does not want to divuldge about his mental health.      Family Dynamics   Good Support System? Yes    Comments He can look to his wife and sons for support.      Barriers   Psychosocial barriers to participate in program There are no identifiable barriers or psychosocial needs.;The patient should benefit from training in stress management and relaxation.;Psychosocial barriers identified (see note)      Screening Interventions   Interventions Encouraged to exercise;Program counselor consult;Provide feedback about the scores to participant;To provide support and resources with identified psychosocial needs    Expected Outcomes Short Term goal: Utilizing psychosocial counselor, staff and physician to assist with  identification of specific Stressors or current issues interfering with healing process. Setting desired goal for each stressor or current issue identified.;Long Term Goal: Stressors or current issues are controlled or eliminated.;Short Term goal: Identification and review with participant of any Quality of Life or Depression concerns found by scoring the questionnaire.;Long Term goal: The participant improves quality of Life and PHQ9 Scores as seen by post scores and/or verbalization of changes           Quality of Life Scores:  Scores of 19 and below usually indicate a poorer quality of life in these areas.  A difference of  2-3 points  is a clinically meaningful difference.  A difference of 2-3 points in the total score of the Quality of Life Index has been associated with significant improvement in overall quality of life, self-image, physical symptoms, and general health in studies assessing change in quality of life.  PHQ-9: Recent Review Flowsheet Data    Depression screen [facility] 2/9 07/06/2020 06/10/2020 05/16/2020 06/10/2018   Decreased Interest '1 1 2 '$ 0   Down, Depressed, Hopeless 0 2 2 0   PHQ - 2 Score '1 3 4 '$ 0   Altered sleeping 0 '1 3 2   '$ Tired, decreased energy '2 3 2 2   '$ Change in appetite '2 3 3 '$ 0   Feeling bad or failure about yourself  0 1 0 0   Trouble concentrating 0 0 3 0   Moving slowly or fidgety/restless '1 1 3 '$ 0   Suicidal thoughts 0 '2 3 2    '$ PHQ-9 Score '6 14 21 6   '$ Difficult doing work/chores Not difficult at all Somewhat difficult  Very difficult Not difficult at all     Interpretation of Total Score  Total Score Depression Severity:  1-4 = Minimal depression, 5-9 = Mild depression, 10-14 = Moderate depression, 15-19 = Moderately severe depression, 20-27 = Severe depression   Psychosocial Evaluation and Intervention:  Psychosocial Evaluation - 05/12/20 0952      Psychosocial Evaluation & Interventions   Interventions Encouraged to exercise with the program and follow  exercise prescription    Comments Patient would not divuldge about his mental health.    Expected Outcomes Short: Attend [facility] stress management education to decrease stress. Long: Maintain exercise [facility] to keep stress at a minimum.    Continue Psychosocial Services  Follow up required by staff           Psychosocial Re-Evaluation:  Psychosocial Re-Evaluation    [place] Name 06/10/20 [id number]             Psychosocial Re-Evaluation   Current issues with Current Depression;History of Depression;Current Stress Concerns       Comments Reviewed patient health questionnaire (PHQ-9) with patient for follow up. Previously, patients score indicated signs/symptoms of depression.  Reviewed to see if patient is improving symptom wise while in program.  Score improved and patient states that it is because they have his meds are working and he is enjoying coming to exercise.       Expected Outcomes Short: Continue to attend rehab and exercise for mental boost  Long; Continue to improve outlook.       Interventions Stress management education;Relaxation education;Encouraged to attend Pulmonary Rehabilitation for the exercise       Continue Psychosocial Services  Follow up required by staff              Psychosocial Discharge (Final Psychosocial Re-Evaluation):  Psychosocial Re-Evaluation - 06/10/20 0837      Psychosocial Re-Evaluation   Current issues with Current Depression;History of Depression;Current Stress Concerns    Comments Reviewed patient health questionnaire (PHQ-9) with patient for follow up. Previously, patients score indicated signs/symptoms of depression.  Reviewed to see if patient is improving symptom wise while in program.  Score improved and patient states that it is because they have his meds are working and he is enjoying coming to exercise.    Expected Outcomes Short: Continue to attend rehab and exercise for mental boost  Long; Continue to improve outlook.     Interventions Stress management education;Relaxation education;Encouraged to attend Pulmonary Rehabilitation for the  exercise    Continue Psychosocial Services  Follow up required by staff           Education: Education Goals: Education classes will be provided on a weekly basis, covering required topics. Participant will state understanding/return demonstration of topics presented.  Learning Barriers/Preferences:  Learning Barriers/Preferences - 05/12/20 0955      Learning Barriers/Preferences   Learning Barriers None    Learning Preferences None           General Pulmonary Education Topics:  Infection Prevention: - Provides verbal and written material to individual with discussion of infection control including proper hand washing and proper equipment cleaning during exercise session.   Pulmonary Rehab from 07/06/2020 in [facility] Cardiac and Pulmonary Rehab  Date 05/12/20  Educator [facility]  Instruction Review Code 1- Verbalizes Understanding      Falls Prevention: - Provides verbal and written material to individual with discussion of falls prevention and safety.   Pulmonary Rehab from 07/06/2020 in [facility] Cardiac and Pulmonary Rehab  Date 05/12/20  Educator [facility]  Instruction Review Code 1- Verbalizes Understanding      Chronic Lung Diseases: - Group verbal and written instruction to review updates, respiratory medications, advancements in procedures and treatments. Discuss use of supplemental oxygen including available portable oxygen systems, continuous and intermittent flow rates, concentrators, personal use and safety guidelines. Review proper use of inhaler and spacers. Provide informative websites for self-education.    Energy Conservation: - Provide group verbal and written instruction for methods to conserve energy, plan and organize activities. Instruct on pacing techniques, use of adaptive equipment and posture/positioning to relieve shortness of breath.   Triggers and  Exacerbations: - Group verbal and written instruction to review types of environmental triggers and ways to prevent exacerbations. Discuss weather changes, air quality and the benefits of nasal washing. Review warning signs and symptoms to help prevent infections. Discuss techniques for effective airway clearance, coughing, and vibrations.   AED/CPR: - Group verbal and written instruction with the use of models to demonstrate the basic use of the AED with the basic ABC's of resuscitation.   Anatomy and Physiology of the Lungs: - Group verbal and written instruction with the use of models to provide basic lung anatomy and physiology related to function, structure and complications of lung disease.   Anatomy & Physiology of the Heart: - Group verbal and written instruction and models provide basic cardiac anatomy and physiology, with the coronary electrical and arterial systems. Review of Valvular disease and Heart Failure   Cardiac Medications: - Group verbal and written instruction to review commonly prescribed medications for heart disease. Reviews the medication, class of the drug, and side effects.   Other: -Provides group and verbal instruction on various topics (see comments)   Knowledge Questionnaire Score:  Knowledge Questionnaire Score - 05/16/20 1002      Knowledge Questionnaire Score   Pre Score 10/16 Education Focus: O2 safety, Nutrition            Core Components/Risk Factors/Patient Goals at Admission:  Personal Goals and Risk Factors at Admission - 05/16/20 1003      Core Components/Risk Factors/Patient Goals on Admission    Weight Management Yes;Weight Loss    Intervention Weight Management: Develop a combined nutrition and exercise program designed to reach desired caloric intake, while maintaining appropriate intake of nutrient and fiber, sodium and fats, and appropriate energy expenditure required for the weight goal.;Weight Management: Provide education and  appropriate resources to help participant work on and attain  dietary goals.;Weight Management/Obesity: Establish reasonable short term and long term weight goals.    Admit Weight 225 lb 11.2 oz (102.4 kg)    Goal Weight: Short Term 220 lb (99.8 kg)    Goal Weight: Long Term 215 lb (97.5 kg)    Expected Outcomes Short Term: Continue to assess and modify interventions until short term weight is achieved;Long Term: Adherence to nutrition and physical activity/exercise program aimed toward attainment of established weight goal;Weight Loss: Understanding of general recommendations for a balanced deficit meal plan, which promotes 1-2 lb weight loss per week and includes a negative energy balance of [id number] kcal/d;Understanding recommendations for meals to include 15-35% energy as protein, 25-35% energy from fat, 35-60% energy from carbohydrates, less than '200mg'$  of dietary cholesterol, 20-35 gm of total fiber daily;Understanding of distribution of calorie intake throughout the day with the consumption of 4-5 meals/snacks    Improve shortness of breath with ADL's Yes    Intervention Provide education, individualized exercise plan and daily activity instruction to help decrease symptoms of SOB with activities of daily living.    Expected Outcomes Short Term: Improve cardiorespiratory fitness to achieve a reduction of symptoms when performing ADLs;Long Term: Be able to perform more ADLs without symptoms or delay the onset of symptoms    Heart Failure --    Lipids Yes    Intervention Provide education and support for participant on nutrition & aerobic/resistive exercise along with prescribed medications to achieve LDL '70mg'$ , HDL >'40mg'$ .    Expected Outcomes Short Term: Participant states understanding of desired cholesterol values and is compliant with medications prescribed. Participant is following exercise prescription and nutrition guidelines.;Long Term: Cholesterol controlled with medications as prescribed,  with individualized exercise RX and with personalized nutrition plan. Value goals: LDL < '70mg'$ , HDL > 40 mg.           Education:Diabetes - Individual verbal and written instruction to review signs/symptoms of diabetes, desired ranges of glucose level fasting, after meals and with exercise. Acknowledge that pre and post exercise glucose checks will be done for 3 sessions at entry of program.   Education: Know Your Numbers and Risk Factors: -Group verbal and written instruction about important numbers in your health.  Discussion of what are risk factors and how they play a role in the disease process.  Review of Cholesterol, Blood Pressure, Diabetes, and BMI and the role they play in your overall health.   Core Components/Risk Factors/Patient Goals Review:   Goals and Risk Factor Review    Row Name 06/13/20 0826 06/13/20 0833           Core Components/Risk Factors/Patient Goals Review   Personal Goals Review Improve shortness of breath with ADL's;Increase knowledge of respiratory medications and ability to use respiratory devices properly.;Weight Management/Obesity --      Review Adam Knight says he doesnt know if he wants to continue with exercise due to it being monotonous.  He mentioned that his Dr gave him 3-5 years to live and next year will be 3.  We reviewed benefits of exercise.  Adam Knight says he takes meds to help with family history of depression.  He reports taking all meds as directed. He sees Dr Thursday about leg pain/heaviness.  He has a couple bruises that have been there a long time.      Expected Outcomes Short:  continue to attend LW Long: maintain exercise on his own --             Core Components/Risk Factors/Patient Goals  at Discharge (Final Review):   Goals and Risk Factor Review - 06/13/20 0833      Core Components/Risk Factors/Patient Goals Review   Review He sees Dr Thursday about leg pain/heaviness.  He has a couple bruises that have been there a long time.            ITP Comments:  ITP Comments    Row Name 05/12/20 0957 05/12/20 1002 05/16/20 0949 05/20/20 0823 06/08/20 1610   ITP Comments Virtual Visit completed. Patient informed on EP and RD appointment and 6 Minute walk test. Patient also informed of patient health questionnaires on My Chart. Patient Verbalizes understanding. Visit diagnosis can be found in [facility] 04/29/2020. -- Completed 6MWT and gym orientation.  Initial ITP created and sent for review to Dr. Emily Filbert, Medical Director. First full day of exercise!  Patient was oriented to gym and equipment including functions, settings, policies, and procedures.  Patient's individual exercise prescription and treatment plan were reviewed.  All starting workloads were established based on the results of the 6 minute walk test done at initial orientation visit.  The plan for exercise progression was also introduced and progression will be customized based on patient's performance and goals. 30 Day review completed. Medical Director ITP review done, changes made as directed, and signed approval by Medical Director.   [place] Name 07/06/20 1056           ITP Comments 30 Day review completed. Medical Director ITP review done, changes made as directed, and signed approval by Medical Director.              Comments:

## 2020-07-08 ENCOUNTER — Other Ambulatory Visit: Payer: Self-pay

## 2020-07-08 DIAGNOSIS — I2724 Chronic thromboembolic pulmonary hypertension: Secondary | ICD-10-CM | POA: Diagnosis not present

## 2020-07-08 DIAGNOSIS — I272 Pulmonary hypertension, unspecified: Secondary | ICD-10-CM

## 2020-07-08 NOTE — Progress Notes (Signed)
Daily Session Note  Patient Details  Name: Adam Knight MRN: [id number] Date of Birth: March 31, 1941 Referring Provider:     Pulmonary Rehab from 05/16/2020 in [facility] Cardiac and Pulmonary Rehab  Referring Provider [name] MD      Encounter Date: 07/08/2020  Check In:  Session Check In - 07/08/20 0754      Check-In   Supervising physician immediately available to respond to emergencies See telemetry face sheet for immediately available ER MD    Location [facility]-Cardiac & Pulmonary Rehab    Staff Present Alberteen Sam, MA, RCEP, CCRP, CCET;Reah Justo [place], RN, BSN;Joseph [place] [employer] [place]    Virtual Visit No    Medication changes reported     No    Fall or balance concerns reported    No    Tobacco Cessation No Change    Warm-up and Cool-down Performed on first and last piece of equipment    Resistance Training Performed Yes    VAD Patient? No    PAD/SET Patient? No      Pain Assessment   Currently in Pain? No/denies              Social History   Tobacco Use  Smoking Status Former Smoker   Packs/day: 0.30   Years: 7.00   Pack years: 2.10   Types: Cigarettes   Quit date: 69   Years since quitting: 54.5  Smokeless Tobacco Never Used    Goals Met:  Independence with exercise equipment Exercise tolerated well No report of cardiac concerns or symptoms  Goals Unmet:  Not Applicable  Comments: Pt able to follow exercise prescription today without complaint.  Will continue to monitor for progression.    Dr. Emily Filbert is Medical Director for [place] and [facility].

## 2020-07-09 ENCOUNTER — Other Ambulatory Visit: Payer: Self-pay | Admitting: Internal Medicine

## 2020-07-11 ENCOUNTER — Other Ambulatory Visit: Payer: Self-pay

## 2020-07-11 ENCOUNTER — Encounter: Payer: Medicare Other | Admitting: *Deleted

## 2020-07-11 DIAGNOSIS — I2724 Chronic thromboembolic pulmonary hypertension: Secondary | ICD-10-CM | POA: Diagnosis not present

## 2020-07-11 DIAGNOSIS — I272 Pulmonary hypertension, unspecified: Secondary | ICD-10-CM

## 2020-07-11 NOTE — Telephone Encounter (Signed)
Refill Request.  

## 2020-07-11 NOTE — Progress Notes (Signed)
Daily Session Note  Patient Details  Name: Adam Knight MRN: [id number] Date of Birth: 11-08-1941 Referring Provider:     Pulmonary Rehab from 05/16/2020 in [facility] Cardiac and Pulmonary Rehab  Referring Provider [name] MD      Encounter Date: 07/11/2020  Check In:  Session Check In - 07/11/20 0835      Check-In   Supervising physician immediately available to respond to emergencies See telemetry face sheet for immediately available ER MD    Location [facility]-Cardiac & Pulmonary Rehab    Staff Present Nyoka Cowden, RN, BSN, [facility] RCP,RRT,BSRT;Vida Rigger RN, Moises Blood, BS, ACSM CEP, Exercise Physiologist    Virtual Visit No    Medication changes reported     No    Fall or balance concerns reported    No    Tobacco Cessation No Change    Warm-up and Cool-down Performed on first and last piece of equipment    Resistance Training Performed Yes    VAD Patient? No    PAD/SET Patient? No      Pain Assessment   Currently in Pain? No/denies              Social History   Tobacco Use  Smoking Status Former Smoker  . Packs/day: 0.30  . Years: 7.00  . Pack years: 2.10  . Types: Cigarettes  . Quit date: 79  . Years since quitting: 54.5  Smokeless Tobacco Never Used    Goals Met:  Independence with exercise equipment Exercise tolerated well No report of cardiac concerns or symptoms Strength training completed today  Goals Unmet:  Not Applicable  Comments: Pt able to follow exercise prescription today without complaint.  Will continue to monitor for progression.   Dr. Emily Filbert is Medical Director for [place] and [facility].

## 2020-07-13 ENCOUNTER — Encounter: Payer: Medicare Other | Admitting: *Deleted

## 2020-07-13 ENCOUNTER — Other Ambulatory Visit: Payer: Self-pay

## 2020-07-13 DIAGNOSIS — I272 Pulmonary hypertension, unspecified: Secondary | ICD-10-CM

## 2020-07-13 DIAGNOSIS — I2724 Chronic thromboembolic pulmonary hypertension: Secondary | ICD-10-CM | POA: Diagnosis not present

## 2020-07-13 NOTE — Progress Notes (Signed)
Daily Session Note  Patient Details  Name: Adam Knight MRN: [id number] Date of Birth: Oct 21, 1941 Referring Provider:     Pulmonary Rehab from 05/16/2020 in [facility] Cardiac and Pulmonary Rehab  Referring Provider [name] MD      Encounter Date: 07/13/2020  Check In:  Session Check In - 07/13/20 0749      Check-In   Supervising physician immediately available to respond to emergencies See telemetry face sheet for immediately available ER MD    Location [facility]-Cardiac & Pulmonary Rehab    Staff Present Renita Papa, RN BSN;Jessica Luan [place], MA, RCEP, CCRP, CCET;Amanda Sommer, [place], ACSM CEP, Exercise Physiologist    Virtual Visit No    Medication changes reported     No    Fall or balance concerns reported    No    Warm-up and Cool-down Performed on first and last piece of equipment    Resistance Training Performed Yes    VAD Patient? No    PAD/SET Patient? No      Pain Assessment   Currently in Pain? No/denies              Social History   Tobacco Use  Smoking Status Former Smoker  . Packs/day: 0.30  . Years: 7.00  . Pack years: 2.10  . Types: Cigarettes  . Quit date: 21  . Years since quitting: 54.5  Smokeless Tobacco Never Used    Goals Met:  Independence with exercise equipment Exercise tolerated well No report of cardiac concerns or symptoms Strength training completed today  Goals Unmet:  Not Applicable  Comments: Pt able to follow exercise prescription today without complaint.  Will continue to monitor for progression.    Dr. Emily Filbert is Medical Director for [place] and [facility].

## 2020-07-15 ENCOUNTER — Other Ambulatory Visit: Payer: Self-pay

## 2020-07-15 ENCOUNTER — Encounter: Payer: Medicare Other | Admitting: *Deleted

## 2020-07-15 DIAGNOSIS — R0609 Other forms of dyspnea: Secondary | ICD-10-CM

## 2020-07-15 DIAGNOSIS — I2724 Chronic thromboembolic pulmonary hypertension: Secondary | ICD-10-CM | POA: Diagnosis not present

## 2020-07-15 DIAGNOSIS — I272 Pulmonary hypertension, unspecified: Secondary | ICD-10-CM

## 2020-07-15 NOTE — Progress Notes (Signed)
Daily Session Note  Patient Details  Name: Masahiro Iglesia MRN: [id number] Date of Birth: 1941-05-27 Referring Provider:     Pulmonary Rehab from 05/16/2020 in [facility] Cardiac and Pulmonary Rehab  Referring Provider [name] MD      Encounter Date: 07/15/2020  Check In:  Session Check In - 07/15/20 0804      Check-In   Supervising physician immediately available to respond to emergencies See telemetry face sheet for immediately available ER MD    Location [facility]-Cardiac & Pulmonary Rehab    Staff Present Heath Lark, RN, BSN, CCRP;Melissa [place] RDN, LDN;Jessica [place], MA, RCEP, CCRP, CCET    Virtual Visit No    Medication changes reported     No    Fall or balance concerns reported    No    Warm-up and Cool-down Performed on first and last piece of equipment    Resistance Training Performed Yes    VAD Patient? No    PAD/SET Patient? No      Pain Assessment   Currently in Pain? No/denies              Social History   Tobacco Use  Smoking Status Former Smoker  . Packs/day: 0.30  . Years: 7.00  . Pack years: 2.10  . Types: Cigarettes  . Quit date: 86  . Years since quitting: 54.5  Smokeless Tobacco Never Used    Goals Met:  Proper associated with RPD/PD & O2 Sat Independence with exercise equipment Exercise tolerated well No report of cardiac concerns or symptoms  Goals Unmet:  Not Applicable  Comments: Pt able to follow exercise prescription today without complaint.  Will continue to monitor for progression.    Dr. Emily Filbert is Medical Director for [place] and [facility].

## 2020-07-18 ENCOUNTER — Encounter: Payer: Medicare Other | Admitting: *Deleted

## 2020-07-18 ENCOUNTER — Other Ambulatory Visit: Payer: Self-pay

## 2020-07-18 DIAGNOSIS — I272 Pulmonary hypertension, unspecified: Secondary | ICD-10-CM

## 2020-07-18 DIAGNOSIS — I2724 Chronic thromboembolic pulmonary hypertension: Secondary | ICD-10-CM | POA: Diagnosis not present

## 2020-07-18 DIAGNOSIS — R0609 Other forms of dyspnea: Secondary | ICD-10-CM

## 2020-07-18 NOTE — Progress Notes (Signed)
Daily Session Note  Patient Details  Name: Adam Knight MRN: [id number] Date of Birth: 1941/07/27 Referring Provider:     Pulmonary Rehab from 05/16/2020 in [facility] Cardiac and Pulmonary Rehab  Referring Provider [name] MD      Encounter Date: 07/18/2020  Check In:  Session Check In - 07/18/20 0806      Check-In   Supervising physician immediately available to respond to emergencies See telemetry face sheet for immediately available ER MD    Location [facility]-Cardiac & Pulmonary Rehab    Staff Present Heath Lark, RN, BSN, Laveda Norman, BS, ACSM CEP, Exercise Physiologist;Joseph Tessie Fass RCP,RRT,BSRT    Virtual Visit No    Medication changes reported     No    Fall or balance concerns reported    No    Warm-up and Cool-down Performed on first and last piece of equipment    Resistance Training Performed Yes    VAD Patient? No    PAD/SET Patient? No      Pain Assessment   Currently in Pain? No/denies              Social History   Tobacco Use  Smoking Status Former Smoker  . Packs/day: 0.30  . Years: 7.00  . Pack years: 2.10  . Types: Cigarettes  . Quit date: 61  . Years since quitting: 54.6  Smokeless Tobacco Never Used    Goals Met:  Proper associated with RPD/PD & O2 Sat Independence with exercise equipment Exercise tolerated well No report of cardiac concerns or symptoms  Goals Unmet:  Not Applicable  Comments: Pt able to follow exercise prescription today without complaint.  Will continue to monitor for progression.    Dr. Emily Filbert is Medical Director for [place] and [facility].

## 2020-07-22 ENCOUNTER — Encounter: Payer: Medicare Other | Admitting: *Deleted

## 2020-07-22 ENCOUNTER — Other Ambulatory Visit: Payer: Self-pay

## 2020-07-22 DIAGNOSIS — I2724 Chronic thromboembolic pulmonary hypertension: Secondary | ICD-10-CM | POA: Diagnosis not present

## 2020-07-22 DIAGNOSIS — I272 Pulmonary hypertension, unspecified: Secondary | ICD-10-CM

## 2020-07-22 NOTE — Progress Notes (Signed)
Daily Session Note  Patient Details  Name: Adam Knight MRN: [id number] Date of Birth: November 20, 1941 Referring Provider:     Pulmonary Rehab from 05/16/2020 in [facility] Cardiac and Pulmonary Rehab  Referring Provider [name] MD      Encounter Date: 07/22/2020  Check In:  Session Check In - 07/22/20 0814      Check-In   Supervising physician immediately available to respond to emergencies See telemetry face sheet for immediately available ER MD    Location [facility]-Cardiac & Pulmonary Rehab    Staff Present Alberteen Sam, MA, RCEP, CCRP, CCET;Joseph Conway;Heath Lark, RN, BSN, CCRP    Virtual Visit No    Medication changes reported     No    Fall or balance concerns reported    No    Warm-up and Cool-down Performed on first and last piece of equipment    Resistance Training Performed Yes    VAD Patient? No    PAD/SET Patient? No      Pain Assessment   Currently in Pain? No/denies              Social History   Tobacco Use  Smoking Status Former Smoker  . Packs/day: 0.30  . Years: 7.00  . Pack years: 2.10  . Types: Cigarettes  . Quit date: 52  . Years since quitting: 54.6  Smokeless Tobacco Never Used    Goals Met:  Proper associated with RPD/PD & O2 Sat Independence with exercise equipment Exercise tolerated well No report of cardiac concerns or symptoms  Goals Unmet:  Not Applicable  Comments: Pt able to follow exercise prescription today without complaint.  Will continue to monitor for progression.    Dr. Emily Filbert is Medical Director for [place] and [facility].

## 2020-07-26 ENCOUNTER — Encounter: Payer: Medicare Other | Attending: Internal Medicine | Admitting: *Deleted

## 2020-07-26 ENCOUNTER — Other Ambulatory Visit: Payer: Self-pay

## 2020-07-26 DIAGNOSIS — I272 Pulmonary hypertension, unspecified: Secondary | ICD-10-CM

## 2020-07-26 DIAGNOSIS — I2724 Chronic thromboembolic pulmonary hypertension: Secondary | ICD-10-CM | POA: Insufficient documentation

## 2020-07-26 DIAGNOSIS — Z79899 Other long term (current) drug therapy: Secondary | ICD-10-CM | POA: Insufficient documentation

## 2020-07-26 DIAGNOSIS — I2699 Other pulmonary embolism without acute cor pulmonale: Secondary | ICD-10-CM | POA: Diagnosis not present

## 2020-07-26 DIAGNOSIS — R7303 Prediabetes: Secondary | ICD-10-CM | POA: Diagnosis not present

## 2020-07-26 DIAGNOSIS — Z7901 Long term (current) use of anticoagulants: Secondary | ICD-10-CM | POA: Diagnosis not present

## 2020-07-26 DIAGNOSIS — G4733 Obstructive sleep apnea (adult) (pediatric): Secondary | ICD-10-CM | POA: Insufficient documentation

## 2020-07-26 DIAGNOSIS — E785 Hyperlipidemia, unspecified: Secondary | ICD-10-CM | POA: Insufficient documentation

## 2020-07-26 DIAGNOSIS — Z87891 Personal history of nicotine dependence: Secondary | ICD-10-CM | POA: Insufficient documentation

## 2020-07-26 DIAGNOSIS — Z9989 Dependence on other enabling machines and devices: Secondary | ICD-10-CM | POA: Diagnosis not present

## 2020-07-26 NOTE — Progress Notes (Signed)
Daily Session Note  Patient Details  Name: Adam Knight MRN: [id number] Date of Birth: [birth year] Referring Provider:     Pulmonary Rehab from 05/16/2020 in [facility] Cardiac and Pulmonary Rehab  Referring Provider [name] MD      Encounter Date: 07/26/2020  Check In:  Session Check In - 07/26/20 0814      Check-In   Supervising physician immediately available to respond to emergencies See telemetry face sheet for immediately available ER MD    Location [facility]-Cardiac & Pulmonary Rehab    Staff Present Heath Lark, RN, BSN, Lance Sell, BA, ACSM CEP, Exercise Physiologist;Kara Eliezer Bottom, MS Exercise Physiologist    Virtual Visit No    Medication changes reported     No    Fall or balance concerns reported    No    Warm-up and Cool-down Performed on first and last piece of equipment    Resistance Training Performed Yes    VAD Patient? No    PAD/SET Patient? No      Pain Assessment   Currently in Pain? No/denies              Social History   Tobacco Use  Smoking Status Former Smoker  . Packs/day: 0.30  . Years: 7.00  . Pack years: 2.10  . Types: Cigarettes  . Quit date: 35  . Years since quitting: 54.6  Smokeless Tobacco Never Used    Goals Met:  Proper associated with RPD/PD & O2 Sat Independence with exercise equipment Exercise tolerated well No report of cardiac concerns or symptoms  Goals Unmet:  Not Applicable  Comments: Pt able to follow exercise prescription today without complaint.  Will continue to monitor for progression.    Dr. Emily Filbert is Medical Director for [place] and [facility].

## 2020-07-27 ENCOUNTER — Other Ambulatory Visit: Payer: Self-pay

## 2020-07-27 ENCOUNTER — Ambulatory Visit (INDEPENDENT_AMBULATORY_CARE_PROVIDER_SITE_OTHER): Payer: Medicare Other

## 2020-07-27 ENCOUNTER — Encounter: Payer: Medicare Other | Admitting: *Deleted

## 2020-07-27 DIAGNOSIS — I2724 Chronic thromboembolic pulmonary hypertension: Secondary | ICD-10-CM | POA: Diagnosis not present

## 2020-07-27 DIAGNOSIS — Z5181 Encounter for therapeutic drug level monitoring: Secondary | ICD-10-CM

## 2020-07-27 DIAGNOSIS — I2602 Saddle embolus of pulmonary artery with acute cor pulmonale: Secondary | ICD-10-CM | POA: Diagnosis not present

## 2020-07-27 DIAGNOSIS — I272 Pulmonary hypertension, unspecified: Secondary | ICD-10-CM

## 2020-07-27 LAB — POCT INR: INR: 4.2 — AB (ref 2.0–3.0)

## 2020-07-27 NOTE — Patient Instructions (Signed)
-   skip warfarin today & tomorrow, then - on Friday resume warfarin dosage of 1 tablet (5 mg) every day EXCEPT 1.5 TABLETS ON MONDAYS & FRIDAYS. - Recheck in 6 weeks.

## 2020-07-27 NOTE — Progress Notes (Signed)
Daily Session Note  Patient Details  Name: Adam Knight MRN: [id number] Date of Birth: May 26, 1941 Referring Provider:     Pulmonary Rehab from 05/16/2020 in [facility] Cardiac and Pulmonary Rehab  Referring Provider [name] MD      Encounter Date: 07/27/2020  Check In:  Session Check In - 07/27/20 0835      Check-In   Supervising physician immediately available to respond to emergencies See telemetry face sheet for immediately available ER MD    Location [facility]-Cardiac & Pulmonary Rehab    Staff Present Hope Budds RDN, LDN;Joseph Foy [place], BA, ACSM CEP, Exercise Physiologist;Latriece Anstine, RN, BSN, CCRP    Virtual Visit No    Medication changes reported     No    Fall or balance concerns reported    No    Warm-up and Cool-down Performed on first and last piece of equipment    Resistance Training Performed Yes    VAD Patient? No    PAD/SET Patient? No      Pain Assessment   Currently in Pain? No/denies              Social History   Tobacco Use  Smoking Status Former Smoker  . Packs/day: 0.30  . Years: 7.00  . Pack years: 2.10  . Types: Cigarettes  . Quit date: 39  . Years since quitting: 54.6  Smokeless Tobacco Never Used    Goals Met:  Proper associated with RPD/PD & O2 Sat Independence with exercise equipment Exercise tolerated well No report of cardiac concerns or symptoms  Goals Unmet:  Not Applicable  Comments: Pt able to follow exercise prescription today without complaint.  Will continue to monitor for progression.    Dr. Emily Filbert is Medical Director for [place] and [facility].

## 2020-07-28 ENCOUNTER — Ambulatory Visit (INDEPENDENT_AMBULATORY_CARE_PROVIDER_SITE_OTHER): Payer: Medicare Other

## 2020-07-28 ENCOUNTER — Encounter: Payer: Medicare Other | Admitting: *Deleted

## 2020-07-28 ENCOUNTER — Other Ambulatory Visit: Payer: Self-pay

## 2020-07-28 DIAGNOSIS — L570 Actinic keratosis: Secondary | ICD-10-CM | POA: Diagnosis not present

## 2020-07-28 DIAGNOSIS — I2724 Chronic thromboembolic pulmonary hypertension: Secondary | ICD-10-CM | POA: Diagnosis not present

## 2020-07-28 DIAGNOSIS — I272 Pulmonary hypertension, unspecified: Secondary | ICD-10-CM

## 2020-07-28 MED ORDER — AMINOLEVULINIC ACID HCL 20 % EX SOLR
1.0000 "application " | Freq: Once | CUTANEOUS | Status: AC
  Administered 2020-07-28: 354 mg via TOPICAL

## 2020-07-28 NOTE — Progress Notes (Signed)
Daily Session Note  Patient Details  Name: Adam Knight MRN: [id number] Date of Birth: 05/20/1941 Referring Provider:     Pulmonary Rehab from 05/16/2020 in [facility] Cardiac and Pulmonary Rehab  Referring Provider [name] MD      Encounter Date: 07/28/2020  Check In:  Session Check In - 07/28/20 0843      Check-In   Supervising physician immediately available to respond to emergencies See telemetry face sheet for immediately available ER MD    Location [facility]-Cardiac & Pulmonary Rehab    Staff Present Heath Lark, RN, BSN, CCRP;Amanda Sommer, BA, ACSM CEP, Exercise Physiologist;Melissa Caiola RDN, LDN    Virtual Visit No    Medication changes reported     No    Fall or balance concerns reported    No    Warm-up and Cool-down Performed on first and last piece of equipment    Resistance Training Performed Yes    VAD Patient? No    PAD/SET Patient? No      Pain Assessment   Currently in Pain? No/denies              Social History   Tobacco Use  Smoking Status Former Smoker  . Packs/day: 0.30  . Years: 7.00  . Pack years: 2.10  . Types: Cigarettes  . Quit date: 8  . Years since quitting: 54.6  Smokeless Tobacco Never Used    Goals Met:  Proper associated with RPD/PD & O2 Sat Independence with exercise equipment Exercise tolerated well No report of cardiac concerns or symptoms  Goals Unmet:  Not Applicable  Comments: Pt able to follow exercise prescription today without complaint.  Will continue to monitor for progression.    Dr. Emily Filbert is Medical Director for [place] and [facility].

## 2020-07-28 NOTE — Patient Instructions (Signed)

## 2020-07-28 NOTE — Progress Notes (Signed)

## 2020-08-01 ENCOUNTER — Other Ambulatory Visit: Payer: Self-pay

## 2020-08-01 ENCOUNTER — Encounter: Payer: Medicare Other | Admitting: *Deleted

## 2020-08-01 DIAGNOSIS — I2724 Chronic thromboembolic pulmonary hypertension: Secondary | ICD-10-CM | POA: Diagnosis not present

## 2020-08-01 DIAGNOSIS — I272 Pulmonary hypertension, unspecified: Secondary | ICD-10-CM

## 2020-08-01 NOTE — Progress Notes (Signed)
Daily Session Note  Patient Details  Name: Adam Knight MRN: [id number] Date of Birth: 06-Mar-1941 Referring Provider:     Pulmonary Rehab from 05/16/2020 in [facility] Cardiac and Pulmonary Rehab  Referring Provider [name] MD      Encounter Date: 08/01/2020  Check In:  Session Check In - 08/01/20 0800      Check-In   Supervising physician immediately available to respond to emergencies See telemetry face sheet for immediately available ER MD    Location [facility]-Cardiac & Pulmonary Rehab    Staff Present Heath Lark, RN, BSN, CCRP;Joseph Hood RCP,RRT,BSRT;Kelly Anderson, [place], ACSM CEP, Exercise Physiologist    Virtual Visit No    Medication changes reported     No    Fall or balance concerns reported    No    Warm-up and Cool-down Performed on first and last piece of equipment    Resistance Training Performed Yes    VAD Patient? No    PAD/SET Patient? No      Pain Assessment   Currently in Pain? No/denies              Social History   Tobacco Use  Smoking Status Former Smoker  . Packs/day: 0.30  . Years: 7.00  . Pack years: 2.10  . Types: Cigarettes  . Quit date: 1  . Years since quitting: 54.6  Smokeless Tobacco Never Used    Goals Met:  Proper associated with RPD/PD & O2 Sat Independence with exercise equipment Exercise tolerated well No report of cardiac concerns or symptoms  Goals Unmet:  Not Applicable  Comments: Pt able to follow exercise prescription today without complaint.  Will continue to monitor for progression.    Dr. Emily Filbert is Medical Director for [place] and [facility].

## 2020-08-03 ENCOUNTER — Encounter: Payer: Self-pay | Admitting: *Deleted

## 2020-08-03 ENCOUNTER — Other Ambulatory Visit: Payer: Self-pay

## 2020-08-03 ENCOUNTER — Encounter: Payer: Medicare Other | Admitting: *Deleted

## 2020-08-03 DIAGNOSIS — I272 Pulmonary hypertension, unspecified: Secondary | ICD-10-CM

## 2020-08-03 DIAGNOSIS — I2724 Chronic thromboembolic pulmonary hypertension: Secondary | ICD-10-CM | POA: Diagnosis not present

## 2020-08-03 NOTE — Progress Notes (Signed)
Daily Session Note  Patient Details  Name: Adam Knight MRN: [id number] Date of Birth: 12-Apr-1941 Referring Provider:     Pulmonary Rehab from 05/16/2020 in [facility] Cardiac and Pulmonary Rehab  Referring Provider [name] MD      Encounter Date: 08/03/2020  Check In:  Session Check In - 08/03/20 0812      Check-In   Supervising physician immediately available to respond to emergencies See telemetry face sheet for immediately available ER MD    Location [facility]-Cardiac & Pulmonary Rehab    Staff Present Heath Lark, RN, BSN, CCRP;Jessica [place], MA, RCEP, CCRP, CCET;Joseph [employer], [place], ACSM CEP, Exercise Physiologist    Virtual Visit No    Medication changes reported     No    Fall or balance concerns reported    No    Warm-up and Cool-down Performed on first and last piece of equipment    Resistance Training Performed Yes    VAD Patient? No    PAD/SET Patient? No      Pain Assessment   Currently in Pain? No/denies              Social History   Tobacco Use  Smoking Status Former Smoker   Packs/day: 0.30   Years: 7.00   Pack years: 2.10   Types: Cigarettes   Quit date: 12   Years since quitting: 54.6  Smokeless Tobacco Never Used    Goals Met:  Proper associated with RPD/PD & O2 Sat Independence with exercise equipment Exercise tolerated well No report of cardiac concerns or symptoms  Goals Unmet:  Not Applicable  Comments: Pt able to follow exercise prescription today without complaint.  Will continue to monitor for progression.    Dr. Emily Filbert is Medical Director for [place] and [facility].

## 2020-08-03 NOTE — Progress Notes (Signed)
Pulmonary Individual Treatment Plan  Patient Details  Name: Adam Knight MRN: [id number] Date of Birth: 11/22/1941 Referring Provider:     Pulmonary Rehab from 05/16/2020 in [facility]  Referring Provider Ford, Hubert MD      Initial Encounter Date:    Pulmonary Rehab from 05/16/2020 in [facility]  Date 05/16/20      Visit Diagnosis: Pulmonary hypertension (HCC)  Patient's Home Medications on Admission:  Current Outpatient Medications:  .  albuterol (PROVENTIL HFA;VENTOLIN HFA) 108 (90 Base) MCG/ACT inhaler, Inhale 2 puffs into the lungs every 6 (six) hours as needed for wheezing or shortness of breath., Disp: 1 Inhaler, Rfl: 2 .  B Complex-C (SUPER B COMPLEX PO), Take by mouth daily., Disp: , Rfl:  .  busPIRone (BUSPAR) 5 MG tablet, Take 5 mg by mouth 2 (two) times daily., Disp: , Rfl:  .  Calcium Carbonate Antacid (CALCIUM CARBONATE, DOSED IN MG ELEMENTAL CALCIUM,) 1250 MG/5ML SUSP, Take by mouth., Disp: , Rfl:  .  Cholecalciferol (VITAMIN D) 2000 UNITS tablet, Take 2,000 Units by mouth daily., Disp: , Rfl:  .  finasteride (PROSCAR) 5 MG tablet, Take 5 mg by mouth daily. , Disp: , Rfl:  .  potassium chloride (KLOR-CON) 10 MEQ tablet, Take 40 mEq by mouth daily. , Disp: , Rfl:  .  torsemide (DEMADEX) 20 MG tablet, Take 2 tablets (40 mg total) by mouth daily., Disp: 180 tablet, Rfl: 1 .  warfarin (COUMADIN) 5 MG tablet, TAKE 1 TABLET DAILY OR TAKE AS DIRECTED BY THE ANTICOAGULATION CLINIC., Disp: 90 tablet, Rfl: 0  Past Medical History: Past Medical History:  Diagnosis Date  . Actinic keratosis   . 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 Basal cell carcinoma 04/18/2020   Right mid upper forehead  . 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HCC)   . Dyslipidemia   . Edema   . Gastric ulcer   . OSA on CPAP   . Prediabetes   . Pulmonary embolism  (HCC)   . Seasonal allergies     Tobacco Use: Social History   Tobacco Use  Smoking Status Former Smoker  . Packs/day: 0.30  . Years: 7.00  . Pack years: 2.10  . Types: Cigarettes  . Quit date: 1967  . Years since quitting: 54.6  Smokeless Tobacco Never Used    Labs: Recent Review Flowsheet Data   There is no flowsheet data to display.      Pulmonary Assessment Scores:  Pulmonary Assessment Scores    Row Name 05/16/20 1005         ADL UCSD   ADL Phase Entry     SOB Score total 28     Rest 0     Walk 1     Stairs 3     Bath 0     Dress 1     Shop 1       CAT Score   CAT Score 11       mMRC Score   mMRC Score 2            UCSD: Self-administered rating of dyspnea associated with activities of daily living (ADLs) 6-point scale (0 = "not at all" to 5 = "maximal or unable to do because of breathlessness")  Scoring Scores range from 0 to 120.  Minimally important difference is 5 units  CAT: CAT can identify the health impairment of   COPD patients and is better correlated with disease progression.  CAT has a scoring range of zero to 40. The CAT score is classified into four groups of low (less than 10), medium (10 - 20), high (21-30) and very high (31-40) based on the impact level of disease on health status. A CAT score over 10 suggests significant symptoms.  A worsening CAT score could be explained by an exacerbation, poor medication adherence, poor inhaler technique, or progression of COPD or comorbid conditions.  CAT MCID is 2 points  mMRC: mMRC (Modified Medical Research Council) Dyspnea Scale is used to assess the degree of baseline functional disability in patients of respiratory disease due to dyspnea. No minimal important difference is established. A decrease in score of 1 point or greater is considered a positive change.   Pulmonary Function Assessment:  Pulmonary Function Assessment - 05/12/20 0939      Breath   Shortness of Breath Yes;Limiting  activity           Exercise Target Goals: Exercise Program Goal: Individual exercise prescription set using results from initial 6 min walk test and THRR while considering  patient's activity barriers and safety.   Exercise Prescription Goal: Initial exercise prescription builds to 30-45 minutes a day of aerobic activity, 2-3 days per week.  Home exercise guidelines will be given to patient during program as part of exercise prescription that the participant will acknowledge.  Education: Aerobic Exercise & Resistance Training: - Gives group verbal and written instruction on the various components of exercise. Focuses on aerobic and resistive training programs and the benefits of this training and how to safely progress through these programs..   Education: Exercise & Equipment Safety: - Individual verbal instruction and demonstration of equipment use and safety with use of the equipment.   Pulmonary Rehab from 07/27/2020 in [facility]  Date 05/12/20  Educator [facility]  Instruction Review Code 1- Verbalizes Understanding      Education: Exercise Physiology & General Exercise Guidelines: - Group verbal and written instruction with models to review the exercise physiology of the cardiovascular system and associated critical values. Provides general exercise guidelines with specific guidelines to those with heart or lung disease.    Pulmonary Rehab from 07/27/2020 in [facility] Cardiac and Pulmonary Rehab  Date 07/06/20  Educator [facility]  Instruction Review Code 1- Verbalizes Understanding      Education: Flexibility, Balance, Mind/Body Relaxation: Provides group verbal/written instruction on the benefits of flexibility and balance training, including mind/body exercise modes such as yoga, pilates and tai chi.  Demonstration and skill practice provided.   Activity Barriers & Risk Stratification:  Activity Barriers & Cardiac Risk Stratification - 05/16/20 0954      Activity  Barriers & Cardiac Risk Stratification   Activity Barriers Deconditioning;Arthritis;Muscular Weakness;Shortness of Breath;History of Falls;Balance Concerns;Joint Problems           6 Minute Walk:  6 Minute Walk    Row Name 05/16/20 0950         6 Minute Walk   Phase Initial     Distance 860 feet     Walk Time 6 minutes     # of Rest Breaks 0     MPH 1.63     METS 1.15     RPE 13     Perceived Dyspnea  3     VO2 Peak 4.02     Symptoms Yes (comment)     Comments SOB     Resting HR  79 bpm     Resting BP 124/66     Resting Oxygen Saturation  95 %     Exercise Oxygen Saturation  during 6 min walk 92 %     Max Ex. HR 110 bpm     Max Ex. BP 136/70     2 Minute Post BP 124/68       Interval HR   1 Minute HR 98     2 Minute HR 100     3 Minute HR 108     4 Minute HR 107     5 Minute HR 109     6 Minute HR 110     2 Minute Post HR 94     Interval Heart Rate? Yes       Interval Oxygen   Interval Oxygen? Yes     Baseline Oxygen Saturation % 95 %     1 Minute Oxygen Saturation % 93 %     1 Minute Liters of Oxygen 0 L  Room Air     2 Minute Oxygen Saturation % 92 %     2 Minute Liters of Oxygen 0 L     3 Minute Oxygen Saturation % 93 %     3 Minute Liters of Oxygen 0 L     4 Minute Oxygen Saturation % 94 %     4 Minute Liters of Oxygen 0 L     5 Minute Oxygen Saturation % 94 %     5 Minute Liters of Oxygen 0 L     6 Minute Oxygen Saturation % 95 %     6 Minute Liters of Oxygen 0 L     2 Minute Post Oxygen Saturation % 96 %     2 Minute Post Liters of Oxygen 0 L           Oxygen Initial Assessment:  Oxygen Initial Assessment - 05/12/20 0939      Home Oxygen   Home Oxygen Device None    Sleep Oxygen Prescription CPAP    Liters per minute 0    Home Exercise Oxygen Prescription None    Home at Rest Exercise Oxygen Prescription None    Compliance with Home Oxygen Use Yes      Initial 6 min Walk   Oxygen Used None      Program Oxygen Prescription   Program  Oxygen Prescription None      Intervention   Short Term Goals To learn and exhibit compliance with exercise, home and travel O2 prescription;To learn and understand importance of monitoring SPO2 with pulse oximeter and demonstrate accurate use of the pulse oximeter.;To learn and understand importance of maintaining oxygen saturations>88%;To learn and demonstrate proper pursed lip breathing techniques or other breathing techniques.;To learn and demonstrate proper use of respiratory medications    Long  Term Goals Exhibits compliance with exercise, home and travel O2 prescription;Verbalizes importance of monitoring SPO2 with pulse oximeter and return demonstration;Maintenance of O2 saturations>88%;Exhibits proper breathing techniques, such as pursed lip breathing or other method taught during program session;Compliance with respiratory medication;Demonstrates proper use of MDI's           Oxygen Re-Evaluation:  Oxygen Re-Evaluation    Row Name 05/20/20 0824 06/13/20 0823 07/13/20 0825         Program Oxygen Prescription   Program Oxygen Prescription None None None       Home Oxygen   Home Oxygen Device None None None  Sleep Oxygen Prescription CPAP CPAP CPAP     Liters per minute 0 -- 0     Home Exercise Oxygen Prescription None None None     Home at Rest Exercise Oxygen Prescription None None None     Compliance with Home Oxygen Use Yes Yes Yes       Goals/Expected Outcomes   Short Term Goals To learn and demonstrate proper pursed lip breathing techniques or other breathing techniques. To learn and demonstrate proper pursed lip breathing techniques or other breathing techniques. To learn and demonstrate proper pursed lip breathing techniques or other breathing techniques.     Long  Term Goals Exhibits proper breathing techniques, such as pursed lip breathing or other method taught during program session Exhibits proper breathing techniques, such as pursed lip breathing or other  method taught during program session Exhibits proper breathing techniques, such as pursed lip breathing or other method taught during program session     Comments Reviewed PLB technique with pt.  Talked about how it works and it's importance in maintaining their exercise saturations. Reviewed reasosn for PLB.  O2 has been above 88 with exercise. Mitch does use PLB once in a while as needed.     Goals/Expected Outcomes Short: Become more profiecient at using PLB.   Long: Become independent at using PLB. Short: monitor oxygen at home and use PLB Long:  become independent at using PLB Short:  monitor oxygen at home and use PLB Long: manage shortness of breath and use PLB            Oxygen Discharge (Final Oxygen Re-Evaluation):  Oxygen Re-Evaluation - 07/13/20 0825      Program Oxygen Prescription   Program Oxygen Prescription None      Home Oxygen   Home Oxygen Device None    Sleep Oxygen Prescription CPAP    Liters per minute 0    Home Exercise Oxygen Prescription None    Home at Rest Exercise Oxygen Prescription None    Compliance with Home Oxygen Use Yes      Goals/Expected Outcomes   Short Term Goals To learn and demonstrate proper pursed lip breathing techniques or other breathing techniques.    Long  Term Goals Exhibits proper breathing techniques, such as pursed lip breathing or other method taught during program session    Comments Mitch does use PLB once in a while as needed.    Goals/Expected Outcomes Short:  monitor oxygen at home and use PLB Long: manage shortness of breath and use PLB           Initial Exercise Prescription:  Initial Exercise Prescription - 05/16/20 0900      Date of Initial Exercise RX and Referring Provider   Date 05/16/20    Referring Provider [name] MD      Treadmill   MPH 1.2    Grade 0.5    Minutes 15    METs 2      Recumbant Bike   Level 1    RPM 50    Watts 11    Minutes 15    METs 2      NuStep   Level 1    SPM 80     Minutes 15    METs 2      T5 Nustep   Level 1    SPM 80    Minutes 15    METs 2      Prescription Details   Frequency (times per week)  2    Duration Progress to 30 minutes of continuous aerobic without signs/symptoms of physical distress      Intensity   THRR 40-80% of Max Heartrate 104-129    Ratings of Perceived Exertion 11-13    Perceived Dyspnea 0-4      Progression   Progression Continue to progress workloads to maintain intensity without signs/symptoms of physical distress.      Resistance Training   Training Prescription Yes    Weight 3 lb    Reps 10-15           Perform Capillary Blood Glucose checks as needed.  Exercise Prescription Changes:  Exercise Prescription Changes    Row Name 05/16/20 0900 05/24/20 1600 06/07/20 1500 06/21/20 1300 07/05/20 1300     Response to Exercise   Blood Pressure (Admit) 124/66 126/80 104/62 146/84 142/80   Blood Pressure (Exercise) 136/70 162/78 142/60 144/72 162/78   Blood Pressure (Exit) 124/68 126/64 118/70 124/70 130/70   Heart Rate (Admit) 79 bpm 100 bpm 82 bpm 89 bpm 84 bpm   Heart Rate (Exercise) 110 bpm 113 bpm 90 bpm 90 bpm 96 bpm   Heart Rate (Exit) 93 bpm 98 bpm 94 bpm 93 bpm 92 bpm   Oxygen Saturation (Admit) 95 % 98 % 93 % 93 % 94 %   Oxygen Saturation (Exercise) 92 % 95 % 94 % 93 % 93 %   Oxygen Saturation (Exit) 95 % 93 % 93 % 95 % 92 %   Rating of Perceived Exertion (Exercise) 13 13 12 12 13   Perceived Dyspnea (Exercise) 3 1 -- 1 1   Symptoms SOB -- -- SOB --   Comments walk test results first full day of exercise -- -- --   Duration -- Progress to 30 minutes of  aerobic without signs/symptoms of physical distress Continue with 30 min of aerobic exercise without signs/symptoms of physical distress. Continue with 30 min of aerobic exercise without signs/symptoms of physical distress. Continue with 30 min of aerobic exercise without signs/symptoms of physical distress.   Intensity -- THRR unchanged THRR  unchanged THRR unchanged THRR unchanged     Progression   Progression -- Continue to progress workloads to maintain intensity without signs/symptoms of physical distress. Continue to progress workloads to maintain intensity without signs/symptoms of physical distress. Continue to progress workloads to maintain intensity without signs/symptoms of physical distress. Continue to progress workloads to maintain intensity without signs/symptoms of physical distress.   Average METs -- 2 2.43 1.67 2     Resistance Training   Training Prescription -- Yes Yes Yes Yes   Weight -- 3 lb 4 lb 4 lb 4 lb   Reps -- 10-15 10-15 10-15 10-15     Interval Training   Interval Training -- No No No No     Treadmill   MPH -- 1.2 -- 1.4 1.4   Grade -- 0.5 -- 0.5 0.5   Minutes -- 15 -- 15 15   METs -- 2 -- 2.17 2.17     Recumbant Bike   Level -- -- 1 1 --   RPM -- -- 50 -- --   Minutes -- -- 15 15 --   METs -- -- 2.43 -- --     NuStep   Level -- -- 2 2 --   SPM -- -- 80 -- --   Minutes -- -- 15 15 --   METs -- -- -- 1.4 --     Arm Ergometer     Level -- -- -- 1 --   Minutes -- -- -- 15 --   METs -- -- -- 1.3 --     T5 Nustep   Level -- 1 -- -- 2   SPM -- -- -- -- 80   Minutes -- 15 -- -- 15   METs -- -- -- -- 1.8     Home Exercise Plan   Plans to continue exercise at -- -- -- Home (comment)  walking Home (comment)  walking   Frequency -- -- -- Add 2 additional days to program exercise sessions. Add 2 additional days to program exercise sessions.   Initial Home Exercises Provided -- -- -- 06/08/20 06/08/20   Row Name 07/19/20 1400 08/02/20 1300           Response to Exercise   Blood Pressure (Admit) 132/80 132/60      Blood Pressure (Exercise) 128/74 130/80      Blood Pressure (Exit) 112/62 128/70      Heart Rate (Admit) 86 bpm 89 bpm      Heart Rate (Exercise) 109 bpm 96 bpm      Heart Rate (Exit) 93 bpm 99 bpm      Oxygen Saturation (Admit) 94 % 96 %      Oxygen Saturation (Exercise)  94 % 94 %      Oxygen Saturation (Exit) 93 % 96 %      Rating of Perceived Exertion (Exercise) 13 13      Perceived Dyspnea (Exercise) 0 1      Symptoms none none      Duration Continue with 30 min of aerobic exercise without signs/symptoms of physical distress. Continue with 30 min of aerobic exercise without signs/symptoms of physical distress.      Intensity THRR unchanged THRR unchanged        Progression   Progression Continue to progress workloads to maintain intensity without signs/symptoms of physical distress. Continue to progress workloads to maintain intensity without signs/symptoms of physical distress.      Average METs 1.99 1.8        Resistance Training   Training Prescription Yes Yes      Weight 5 lb 5 lb      Reps 10-15 10-15        Interval Training   Interval Training No No        Treadmill   MPH 1.4 --      Grade 0.5 --      Minutes 15 --      METs 2.17 --        NuStep   Level 2 2      SPM -- 80      Minutes 15 15      METs 2 1.6        T5 Nustep   Level 2 --      Minutes 15 --      METs 1.8 --        Biostep-RELP   Level 3 3      SPM -- 50      Minutes 15 15      METs 2 2        Home Exercise Plan   Plans to continue exercise at Home (comment)  walking --      Frequency Add 2 additional days to program exercise sessions. --      Initial Home Exercises Provided 06/08/20 --               Exercise Comments:  Exercise Comments    Row Name 05/20/20 0823           Exercise Comments First full day of exercise!  Patient was oriented to gym and equipment including functions, settings, policies, and procedures.  Patient's individual exercise prescription and treatment plan were reviewed.  All starting workloads were established based on the results of the 6 minute walk test done at initial orientation visit.  The plan for exercise progression was also introduced and progression will be customized based on patient's performance and goals.               Exercise Goals and Review:  Exercise Goals    Row Name 05/16/20 0956             Exercise Goals   Increase Physical Activity Yes       Intervention Provide advice, education, support and counseling about physical activity/exercise needs.;Develop an individualized exercise prescription for aerobic and resistive training based on initial evaluation findings, risk stratification, comorbidities and participant's personal goals.       Expected Outcomes Long Term: Add in home exercise to make exercise part of routine and to increase amount of physical activity.;Short Term: Attend rehab on a regular basis to increase amount of physical activity.;Long Term: Exercising regularly at least 3-5 days a week.       Increase Strength and Stamina Yes       Intervention Provide advice, education, support and counseling about physical activity/exercise needs.;Develop an individualized exercise prescription for aerobic and resistive training based on initial evaluation findings, risk stratification, comorbidities and participant's personal goals.       Expected Outcomes Short Term: Increase workloads from initial exercise prescription for resistance, speed, and METs.;Short Term: Perform resistance training exercises routinely during rehab and add in resistance training at home;Long Term: Improve cardiorespiratory fitness, muscular endurance and strength as measured by increased METs and functional capacity (6MWT)       Able to understand and use rate of perceived exertion (RPE) scale Yes       Intervention Provide education and explanation on how to use RPE scale       Expected Outcomes Short Term: Able to use RPE daily in rehab to express subjective intensity level;Long Term:  Able to use RPE to guide intensity level when exercising independently       Able to understand and use Dyspnea scale Yes       Intervention Provide education and explanation on how to use Dyspnea scale       Expected Outcomes Long  Term: Able to use Dyspnea scale to guide intensity level when exercising independently;Short Term: Able to use Dyspnea scale daily in rehab to express subjective sense of shortness of breath during exertion       Knowledge and understanding of Target Heart Rate Range (THRR) Yes       Intervention Provide education and explanation of THRR including how the numbers were predicted and where they are located for reference       Expected Outcomes Short Term: Able to state/look up THRR;Short Term: Able to use daily as guideline for intensity in rehab;Long Term: Able to use THRR to govern intensity when exercising independently       Able to check pulse independently Yes       Intervention Provide education and demonstration on how to check pulse in carotid and radial arteries.;Review the importance of being able to check your own pulse for safety during independent   exercise       Expected Outcomes Short Term: Able to explain why pulse checking is important during independent exercise;Long Term: Able to check pulse independently and accurately       Understanding of Exercise Prescription Yes       Intervention Provide education, explanation, and written materials on patient's individual exercise prescription       Expected Outcomes Short Term: Able to explain program exercise prescription;Long Term: Able to explain home exercise prescription to exercise independently              Exercise Goals Re-Evaluation :  Exercise Goals Re-Evaluation    Row Name 05/20/20 0823 05/24/20 1559 06/07/20 1506 06/08/20 0851 06/21/20 1351     Exercise Goal Re-Evaluation   Exercise Goals Review Able to understand and use rate of perceived exertion (RPE) scale;Able to understand and use Dyspnea scale;Knowledge and understanding of Target Heart Rate Range (THRR);Understanding of Exercise Prescription Increase Physical Activity;Increase Strength and Stamina;Understanding of Exercise Prescription Increase Physical  Activity;Increase Strength and Stamina;Understanding of Exercise Prescription Increase Physical Activity;Increase Strength and Stamina;Understanding of Exercise Prescription Increase Physical Activity;Increase Strength and Stamina;Understanding of Exercise Prescription   Comments Reviewed RPE and dyspnea scales, THR and program prescription with pt today.  Pt voiced understanding and was given a copy of goals to take home. Mitch has completed his first full day of exercise.  We will continue to monitor his progress. Mitch has increased workloads on T4 and weights to 4 lb for strength work. Reviewed home exercise with pt today.  Pt plans to walk/consider going to [place] for exercise.  Reviewed THR, pulse, RPE, sign and symptoms, pulse oximetery and when to call 911 or MD.  Also discussed weather considerations and indoor options.  Pt voiced understanding. Mitch has been doing well in rehab.  He is up to level 2 on the T5 Nustep.  We will continue to monitor his progres.   Expected Outcomes Short: Use RPE daily to regulate intensity. Long: Follow program prescription in THR. Short: Continue to attend rehab regularly Long: Continue follow program prescription. Short : exercise consistently Long: continue to improve MET level Short:  add in one day of exercise outside program sessions Short: Start to increase seated equipment.  Long: Continue to improve stamina.   Row Name 07/05/20 1356 07/13/20 0804 07/19/20 1400 08/02/20 1402       Exercise Goal Re-Evaluation   Exercise Goals Review Increase Physical Activity;Increase Strength and Stamina;Understanding of Exercise Prescription Increase Physical Activity;Increase Strength and Stamina;Understanding of Exercise Prescription Increase Physical Activity;Increase Strength and Stamina;Understanding of Exercise Prescription Increase Physical Activity;Increase Strength and Stamina;Understanding of Exercise Prescription    Comments Mitch continues to tolerate exercise  well.  He attended  consistently in June  but missed some sessions first part of July. Mitch states he can do more at home since he started exercising.  He sees Dr today about leg pain. Mitch has been doing well in rehab.  He is now up to 2 METs on both BioStep and NuStep.  He has also increased to 5 lb weights. We will continue to monitor his progress. Mitch attends consistently.  Oxygen stas stay in the 90 during exercise. Staff will monitor progress.    Expected Outcomes Short: get back to consistent attendance Long: increase stamina and MET level Short: attend consistently Long: increase overall stamina Short: Continue to boost workloads long; Continue to improve stamina. Short: continue to attend consistently Long: increase MET level             Discharge Exercise Prescription (Final Exercise Prescription Changes):  Exercise Prescription Changes - 08/02/20 1300      Response to Exercise   Blood Pressure (Admit) 132/60    Blood Pressure (Exercise) 130/80    Blood Pressure (Exit) 128/70    Heart Rate (Admit) 89 bpm    Heart Rate (Exercise) 96 bpm    Heart Rate (Exit) 99 bpm    Oxygen Saturation (Admit) 96 %    Oxygen Saturation (Exercise) 94 %    Oxygen Saturation (Exit) 96 %    Rating of Perceived Exertion (Exercise) 13    Perceived Dyspnea (Exercise) 1    Symptoms none    Duration Continue with 30 min of aerobic exercise without signs/symptoms of physical distress.    Intensity THRR unchanged      Progression   Progression Continue to progress workloads to maintain intensity without signs/symptoms of physical distress.    Average METs 1.8      Resistance Training   Training Prescription Yes    Weight 5 lb    Reps 10-15      Interval Training   Interval Training No      NuStep   Level 2    SPM 80    Minutes 15    METs 1.6      Biostep-RELP   Level 3    SPM 50    Minutes 15    METs 2           Nutrition:  Target Goals: Understanding of nutrition guidelines,  daily intake of sodium <1500mg, cholesterol <200mg, calories 30% from fat and 7% or less from saturated fats, daily to have 5 or more servings of fruits and vegetables.  Education: Controlling Sodium/Reading Food Labels -Group verbal and written material supporting the discussion of sodium use in heart healthy nutrition. Review and explanation with models, verbal and written materials for utilization of the food label.   Education: General Nutrition Guidelines/Fats and Fiber: -Group instruction provided by verbal, written material, models and posters to present the general guidelines for heart healthy nutrition. Gives an explanation and review of dietary fats and fiber.   Biometrics:  Pre Biometrics - 05/16/20 0956      Pre Biometrics   Height 5' 3.5" (1.613 m)    Weight 225 lb 11.2 oz (102.4 kg)    BMI (Calculated) 39.35    Single Leg Stand 0 seconds            Nutrition Therapy Plan and Nutrition Goals:  Nutrition Therapy & Goals - 06/15/20 0811      Nutrition Therapy   Diet heart healthy, low sodium    Drug/Food Interactions Coumadin/Vit K    Protein (specify units) 75-80g    Fiber 30 grams    Whole Grain Foods 3 servings    Saturated Fats 12 max. grams    Fruits and Vegetables 5 servings/day    Sodium 1.5 grams      Personal Nutrition Goals   Nutrition Goal ST: none at this time LT: increase longevity, increase functional years    Comments B: ear of corn L: turkey burger for lunch, no bread with pico de gallo. D: Chili. Pt eats lean proteins and lots of vegetables and fruit. Pt also eats bread, but feels it clogs him up so is now avoiding it. Discussed whole grains, natural laxatives like prunes and prune juice (pt rpeorts doing so), and heart healthy eating. Pt would not like to make any new changes at this   time.      Intervention Plan   Intervention Prescribe, educate and counsel regarding individualized specific dietary modifications aiming towards targeted core  components such as weight, hypertension, lipid management, diabetes, heart failure and other comorbidities.;Nutrition handout(s) given to patient.    Expected Outcomes Short Term Goal: Understand basic principles of dietary content, such as calories, fat, sodium, cholesterol and nutrients.;Long Term Goal: Adherence to prescribed nutrition plan.;Short Term Goal: A plan has been developed with personal nutrition goals set during dietitian appointment.           Nutrition Assessments:  Nutrition Assessments - 05/16/20 0957      MEDFICTS Scores   Pre Score 84           MEDIFICTS Score Key:          ?70 Need to make dietary changes          40-70 Heart Healthy Diet         ? 40 Therapeutic Level Cholesterol Diet  Nutrition Goals Re-Evaluation:  Nutrition Goals Re-Evaluation    [place] Name 07/11/20 0817             Goals   Nutrition Goal ST: choose more heart healthy snack choices LT: increase longevity, increase functional years       Comment Pt reports eating out of boredom, discussed mindful eating and snacks he could eat that are heart healthy and lighter on calories (eating out of boredom and not hunger); veggies and dip, popcorn (kernals and spices), fruit and nuts or nut butter, edammame, etc.       Expected Outcome ST: choose more heart healthy snack choices LT: increase longevity, increase functional years              Nutrition Goals Discharge (Final Nutrition Goals Re-Evaluation):  Nutrition Goals Re-Evaluation - 07/11/20 0817      Goals   Nutrition Goal ST: choose more heart healthy snack choices LT: increase longevity, increase functional years    Comment Pt reports eating out of boredom, discussed mindful eating and snacks he could eat that are heart healthy and lighter on calories (eating out of boredom and not hunger); veggies and dip, popcorn (kernals and spices), fruit and nuts or nut butter, edammame, etc.    Expected Outcome ST: choose more heart healthy snack  choices LT: increase longevity, increase functional years           Psychosocial: Target Goals: Acknowledge presence or absence of significant depression and/or stress, maximize coping skills, provide positive support system. Participant is able to verbalize types and ability to use techniques and skills needed for reducing stress and depression.   Education: Depression - Provides group verbal and written instruction on the correlation between heart/lung disease and depressed mood, treatment options, and the stigmas associated with seeking treatment.   Pulmonary Rehab from 07/27/2020 in [facility] Cardiac and Pulmonary Rehab  Date 07/27/20  Educator [facility]  Instruction Review Code 1- [employer] Understanding      Education: Sleep Hygiene -Provides group verbal and written instruction about how sleep can affect your health.  Define sleep hygiene, discuss sleep cycles and impact of sleep habits. Review good sleep hygiene tips.    Education: Stress and Anxiety: - Provides group verbal and written instruction about the health risks of elevated stress and causes of high stress.  Discuss the correlation between heart/lung disease and anxiety and treatment options. Review healthy ways to manage with stress and anxiety.   Initial Review & Psychosocial Screening:  Initial Psych Review & Screening - 05/12/20 0940      Initial Review   Current issues with Current Depression;Current Psychotropic Meds;Current Stress Concerns    Source of Stress Concerns Chronic Illness    Comments He does not want to divuldge about his mental health.      Family Dynamics   Good Support System? Yes    Comments He can look to his wife and sons for support.      Barriers   Psychosocial barriers to participate in program There are no identifiable barriers or psychosocial needs.;The patient should benefit from training in stress management and relaxation.;Psychosocial barriers identified (see note)      Screening  Interventions   Interventions Encouraged to exercise;Program counselor consult;Provide feedback about the scores to participant;To provide support and resources with identified psychosocial needs    Expected Outcomes Short Term goal: Utilizing psychosocial counselor, staff and physician to assist with identification of specific Stressors or current issues interfering with healing process. Setting desired goal for each stressor or current issue identified.;Long Term Goal: Stressors or current issues are controlled or eliminated.;Short Term goal: Identification and review with participant of any Quality of Life or Depression concerns found by scoring the questionnaire.;Long Term goal: The participant improves quality of Life and PHQ9 Scores as seen by post scores and/or verbalization of changes           Quality of Life Scores:  Scores of 19 and below usually indicate a poorer quality of life in these areas.  A difference of  2-3 points is a clinically meaningful difference.  A difference of 2-3 points in the total score of the Quality of Life Index has been associated with significant improvement in overall quality of life, self-image, physical symptoms, and general health in studies assessing change in quality of life.  PHQ-9: Recent Review Flowsheet Data    Depression screen [facility] 2/9 07/06/2020 06/10/2020 05/16/2020 06/10/2018   Decreased Interest _0 0   Down, Depressed, Hopeless 0 2 2 0   PHQ - 2 Score _1 0   Altered sleeping 0 _2 Tired, decreased energy _3 Change in appetite _4 0   Feeling bad or failure about yourself  0 1 0 0   Trouble concentrating 0 0 3 0   Moving slowly or fidgety/restless _5 0   Suicidal thoughts 0 _6 PHQ-9 Score _7 Difficult doing work/chores Not difficult at all Somewhat difficult  Very difficult Not difficult at all     Interpretation of Total Score  Total Score Depression Severity:  1-4 = Minimal depression, 5-9 = Mild  depression, 10-14 = Moderate depression, 15-19 = Moderately severe depression, 20-27 = Severe depression   Psychosocial Evaluation and Intervention:  Psychosocial Evaluation - 05/12/20 0952      Psychosocial Evaluation & Interventions   Interventions Encouraged to exercise with the program and follow exercise prescription    Comments Patient would not divuldge about his mental health.    Expected Outcomes Short: Attend [facility] stress management education to decrease stress. Long: Maintain exercise Post [facility] to keep stress at a minimum.    Continue Psychosocial Services  Follow up required by staff           Psychosocial Re-Evaluation:  Psychosocial Re-Evaluation    [place] Name 06/10/20 [phone] 07/11/20 1133  Psychosocial Re-Evaluation   Current issues with Current Depression;History of Depression;Current Stress Concerns Current Stress Concerns      Comments Reviewed patient health questionnaire (PHQ-9) with patient for follow up. Previously, patients score indicated signs/symptoms of depression.  Reviewed to see if patient is improving symptom wise while in program.  Score improved and patient states that it is because they have his meds are working and he is enjoying coming to exercise. Reviewed patient health questionnaire (PHQ-9) with patient for follow up. Previously, patients score indicated signs/symptoms of depression.  Reviewed to see if patient is improving symptom wise while in program.  Score improved and patient states that it is because he has been getting out more to rehab and improving his health.      Expected Outcomes Short: Continue to attend rehab and exercise for mental boost  Long; Continue to improve outlook. Short: Continue to attend [facility] regularly for regular exercise and social engagement. Long: Continue to improve symptoms and manage a positive mental state.      Interventions Stress management education;Relaxation education;Encouraged to attend  Pulmonary Rehabilitation for the exercise Encouraged to attend Pulmonary Rehabilitation for the exercise      Continue Psychosocial Services  Follow up required by staff Follow up required by staff             Psychosocial Discharge (Final Psychosocial Re-Evaluation):  Psychosocial Re-Evaluation - 07/11/20 1133      Psychosocial Re-Evaluation   Current issues with Current Stress Concerns    Comments Reviewed patient health questionnaire (PHQ-9) with patient for follow up. Previously, patients score indicated signs/symptoms of depression.  Reviewed to see if patient is improving symptom wise while in program.  Score improved and patient states that it is because he has been getting out more to rehab and improving his health.    Expected Outcomes Short: Continue to attend [facility] regularly for regular exercise and social engagement. Long: Continue to improve symptoms and manage a positive mental state.    Interventions Encouraged to attend Pulmonary Rehabilitation for the exercise    Continue Psychosocial Services  Follow up required by staff           Education: Education Goals: Education classes will be provided on a weekly basis, covering required topics. Participant will state understanding/return demonstration of topics presented.  Learning Barriers/Preferences:  Learning Barriers/Preferences - 05/12/20 0955      Learning Barriers/Preferences   Learning Barriers None    Learning Preferences None           General Pulmonary Education Topics:  Infection Prevention: - Provides verbal and written material to individual with discussion of infection control including proper hand washing and proper equipment cleaning during exercise session.   Pulmonary Rehab from 07/27/2020 in [facility] Cardiac and Pulmonary Rehab  Date 05/12/20  Educator [facility]  Instruction Review Code 1- Verbalizes Understanding      Falls Prevention: - Provides verbal and written material to individual with  discussion of falls prevention and safety.   Pulmonary Rehab from 07/27/2020 in [facility] Cardiac and Pulmonary Rehab  Date 05/12/20  Educator [facility]  Instruction Review Code 1- Verbalizes Understanding      Chronic Lung Diseases: - Group verbal and written instruction to review updates, respiratory medications, advancements in procedures and treatments. Discuss use of supplemental oxygen including available portable oxygen systems, continuous and intermittent flow rates, concentrators, personal use and safety guidelines. Review proper use of inhaler and spacers. Provide informative websites for self-education.    Pulmonary Rehab  from 07/27/2020 in [facility] Cardiac and Pulmonary Rehab  Date 07/13/20  Educator [facility]  Instruction Review Code 1- Verbalizes Understanding      Energy Conservation: - Provide group verbal and written instruction for methods to conserve energy, plan and organize activities. Instruct on pacing techniques, use of adaptive equipment and posture/positioning to relieve shortness of breath.   Triggers and Exacerbations: - Group verbal and written instruction to review types of environmental triggers and ways to prevent exacerbations. Discuss weather changes, air quality and the benefits of nasal washing. Review warning signs and symptoms to help prevent infections. Discuss techniques for effective airway clearance, coughing, and vibrations.   AED/CPR: - Group verbal and written instruction with the use of models to demonstrate the basic use of the AED with the basic ABC's of resuscitation.   Anatomy and Physiology of the Lungs: - Group verbal and written instruction with the use of models to provide basic lung anatomy and physiology related to function, structure and complications of lung disease.   Anatomy & Physiology of the Heart: - Group verbal and written instruction and models provide basic cardiac anatomy and physiology, with the coronary electrical and arterial systems.  Review of Valvular disease and Heart Failure   Cardiac Medications: - Group verbal and written instruction to review commonly prescribed medications for heart disease. Reviews the medication, class of the drug, and side effects.   Other: -Provides group and verbal instruction on various topics (see comments)   Knowledge Questionnaire Score:  Knowledge Questionnaire Score - 05/16/20 1002      Knowledge Questionnaire Score   Pre Score 10/16 Education Focus: O2 safety, Nutrition            Core Components/Risk Factors/Patient Goals at Admission:  Personal Goals and Risk Factors at Admission - 05/16/20 1003      Core Components/Risk Factors/Patient Goals on Admission    Weight Management Yes;Weight Loss    Intervention Weight Management: Develop a combined nutrition and exercise program designed to reach desired caloric intake, while maintaining appropriate intake of nutrient and fiber, sodium and fats, and appropriate energy expenditure required for the weight goal.;Weight Management: Provide education and appropriate resources to help participant work on and attain dietary goals.;Weight Management/Obesity: Establish reasonable short term and long term weight goals.    Admit Weight 225 lb 11.2 oz (102.4 kg)    Goal Weight: Short Term 220 lb (99.8 kg)    Goal Weight: Long Term 215 lb (97.5 kg)    Expected Outcomes Short Term: Continue to assess and modify interventions until short term weight is achieved;Long Term: Adherence to nutrition and physical activity/exercise program aimed toward attainment of established weight goal;Weight Loss: Understanding of general recommendations for a balanced deficit meal plan, which promotes 1-2 lb weight loss per week and includes a negative energy balance of [phone] kcal/d;Understanding recommendations for meals to include 15-35% energy as protein, 25-35% energy from fat, 35-60% energy from carbohydrates, less than 252m of dietary cholesterol, 20-35  gm of total fiber daily;Understanding of distribution of calorie intake throughout the day with the consumption of 4-5 meals/snacks    Improve shortness of breath with ADL's Yes    Intervention Provide education, individualized exercise plan and daily activity instruction to help decrease symptoms of SOB with activities of daily living.    Expected Outcomes Short Term: Improve cardiorespiratory fitness to achieve a reduction of symptoms when performing ADLs;Long Term: Be able to perform more ADLs without symptoms or delay the onset of symptoms  Heart Failure --    Lipids Yes    Intervention Provide education and support for participant on nutrition & aerobic/resistive exercise along with prescribed medications to achieve LDL <70mg, HDL >40mg.    Expected Outcomes Short Term: Participant states understanding of desired cholesterol values and is compliant with medications prescribed. Participant is following exercise prescription and nutrition guidelines.;Long Term: Cholesterol controlled with medications as prescribed, with individualized exercise RX and with personalized nutrition plan. Value goals: LDL < 70mg, HDL > 40 mg.           Education:Diabetes - Individual verbal and written instruction to review signs/symptoms of diabetes, desired ranges of glucose level fasting, after meals and with exercise. Acknowledge that pre and post exercise glucose checks will be done for 3 sessions at entry of program.   Education: Know Your Numbers and Risk Factors: -Group verbal and written instruction about important numbers in your health.  Discussion of what are risk factors and how they play a role in the disease process.  Review of Cholesterol, Blood Pressure, Diabetes, and BMI and the role they play in your overall health.   Core Components/Risk Factors/Patient Goals Review:   Goals and Risk Factor Review    Row Name 06/13/20 0826 06/13/20 0833 07/13/20 0800         Core Components/Risk  Factors/Patient Goals Review   Personal Goals Review Improve shortness of breath with ADL's;Increase knowledge of respiratory medications and ability to use respiratory devices properly.;Weight Management/Obesity -- Improve shortness of breath with ADL's;Increase knowledge of respiratory medications and ability to use respiratory devices properly.;Weight Management/Obesity     Review Mitch says he doesnt know if he wants to continue with exercise due to it being monotonous.  He mentioned that his Dr gave him 3-5 years to live and next year will be 3.  We reviewed benefits of exercise.  Mitch says he takes meds to help with family history of depression.  He reports taking all meds as directed. He sees Dr Thursday about leg pain/heaviness.  He has a couple bruises that have been there a long time. Mitch sees his Dr today abour leg pain.  Today is first day lately he has worn compression socks.  He states he is taking all meds as directed.  He feels he can move easier since hes been exercising.     Expected Outcomes Short:  continue to attend LW Long: maintain exercise on his own -- Short: continue to tale meds and exercise Long:  maintain healthy habits            Core Components/Risk Factors/Patient Goals at Discharge (Final Review):   Goals and Risk Factor Review - 07/13/20 0800      Core Components/Risk Factors/Patient Goals Review   Personal Goals Review Improve shortness of breath with ADL's;Increase knowledge of respiratory medications and ability to use respiratory devices properly.;Weight Management/Obesity    Review Mitch sees his Dr today abour leg pain.  Today is first day lately he has worn compression socks.  He states he is taking all meds as directed.  He feels he can move easier since hes been exercising.    Expected Outcomes Short: continue to tale meds and exercise Long:  maintain healthy habits           ITP Comments:  ITP Comments    Row Name 05/12/20 0957 05/12/20 1002  05/16/20 0949 05/20/20 0823 06/08/20 0632   ITP Comments Virtual Visit completed. Patient informed on EP and RD appointment and   6 Minute walk test. Patient also informed of patient health questionnaires on My Chart. Patient Verbalizes understanding. Visit diagnosis can be found in [facility] 04/29/2020. -- Completed 6MWT and gym orientation.  Initial ITP created and sent for review to Dr. Mark Miller, Medical Director. First full day of exercise!  Patient was oriented to gym and equipment including functions, settings, policies, and procedures.  Patient's individual exercise prescription and treatment plan were reviewed.  All starting workloads were established based on the results of the 6 minute walk test done at initial orientation visit.  The plan for exercise progression was also introduced and progression will be customized based on patient's performance and goals. 30 Day review completed. Medical Director ITP review done, changes made as directed, and signed approval by Medical Director.   Row Name 07/06/20 1056 08/03/20 0631         ITP Comments 30 Day review completed. Medical Director ITP review done, changes made as directed, and signed approval by Medical Director. 30 Day review completed. Medical Director ITP review done, changes made as directed, and signed approval by Medical Director.             Comments:  

## 2020-08-05 ENCOUNTER — Encounter: Payer: Medicare Other | Admitting: *Deleted

## 2020-08-05 ENCOUNTER — Other Ambulatory Visit: Payer: Self-pay

## 2020-08-05 DIAGNOSIS — I2724 Chronic thromboembolic pulmonary hypertension: Secondary | ICD-10-CM | POA: Diagnosis not present

## 2020-08-05 DIAGNOSIS — I272 Pulmonary hypertension, unspecified: Secondary | ICD-10-CM

## 2020-08-05 NOTE — Progress Notes (Signed)
Daily Session Note  Patient Details  Name: Adam Knight MRN: [id number] Date of Birth: 1941/12/07 Referring Provider:     Pulmonary Rehab from 05/16/2020 in [facility] Cardiac and Pulmonary Rehab  Referring Provider [name] MD      Encounter Date: 08/05/2020  Check In:  Session Check In - 08/05/20 0844      Check-In   Supervising physician immediately available to respond to emergencies See telemetry face sheet for immediately available ER MD    Location [facility]-Cardiac & Pulmonary Rehab    Staff Present Heath Lark, RN, BSN, CCRP;Joseph Hood RCP,RRT,BSRT;Jessica [place], [place], [place], [place], CCET    Virtual Visit No    Medication changes reported     No    Fall or balance concerns reported    No    Warm-up and Cool-down Performed on first and last piece of equipment    Resistance Training Performed Yes    VAD Patient? No    PAD/SET Patient? No      Pain Assessment   Currently in Pain? No/denies              Social History   Tobacco Use  Smoking Status Former Smoker  . Packs/day: 0.30  . Years: 7.00  . Pack years: 2.10  . Types: Cigarettes  . Quit date: 21  . Years since quitting: 54.6  Smokeless Tobacco Never Used    Goals Met:  Independence with exercise equipment Exercise tolerated well No report of cardiac concerns or symptoms  Goals Unmet:  Not Applicable  Comments: Pt able to follow exercise prescription today without complaint.  Will continue to monitor for progression.    Dr. Emily Filbert is Medical Director for [facility] and [facility] Pulmonary Rehabilitation.

## 2020-08-08 ENCOUNTER — Encounter: Payer: Medicare Other | Admitting: *Deleted

## 2020-08-08 ENCOUNTER — Other Ambulatory Visit: Payer: Self-pay

## 2020-08-08 DIAGNOSIS — I2724 Chronic thromboembolic pulmonary hypertension: Secondary | ICD-10-CM | POA: Diagnosis not present

## 2020-08-08 DIAGNOSIS — I272 Pulmonary hypertension, unspecified: Secondary | ICD-10-CM

## 2020-08-08 NOTE — Progress Notes (Signed)
Daily Session Note  Patient Details  Name: Adam Knight MRN: [id number] Date of Birth: 02-Dec-1941 Referring Provider:     Pulmonary Rehab from 05/16/2020 in [facility] Cardiac and Pulmonary Rehab  Referring Provider [name] MD      Encounter Date: 08/08/2020  Check In:  Session Check In - 08/08/20 0830      Check-In   Supervising physician immediately available to respond to emergencies See telemetry face sheet for immediately available ER MD    Location [facility]-Cardiac & Pulmonary Rehab    Staff Present Heath Lark, RN, BSN, Laveda Norman, BS, ACSM CEP, Exercise Physiologist;Joseph Tessie Fass RCP,RRT,BSRT    Virtual Visit No    Medication changes reported     No    Fall or balance concerns reported    No    Warm-up and Cool-down Performed on first and last piece of equipment    Resistance Training Performed Yes    VAD Patient? No    PAD/SET Patient? No      Pain Assessment   Currently in Pain? No/denies              Social History   Tobacco Use  Smoking Status Former Smoker  . Packs/day: 0.30  . Years: 7.00  . Pack years: 2.10  . Types: Cigarettes  . Quit date: 45  . Years since quitting: 54.6  Smokeless Tobacco Never Used    Goals Met:  Proper associated with RPD/PD & O2 Sat Independence with exercise equipment Exercise tolerated well No report of cardiac concerns or symptoms  Goals Unmet:  Not Applicable  Comments: Pt able to follow exercise prescription today without complaint.  Will continue to monitor for progression.    Dr. Emily Filbert is Medical Director for [place] and [facility].

## 2020-08-10 ENCOUNTER — Encounter: Payer: Self-pay | Admitting: Internal Medicine

## 2020-08-10 ENCOUNTER — Other Ambulatory Visit: Payer: Self-pay

## 2020-08-10 ENCOUNTER — Ambulatory Visit (INDEPENDENT_AMBULATORY_CARE_PROVIDER_SITE_OTHER): Payer: Medicare Other | Admitting: Internal Medicine

## 2020-08-10 ENCOUNTER — Encounter: Payer: Medicare Other | Admitting: *Deleted

## 2020-08-10 VITALS — BP 116/74 | HR 83 | Ht 62.0 in | Wt 224.0 lb

## 2020-08-10 DIAGNOSIS — I2724 Chronic thromboembolic pulmonary hypertension: Secondary | ICD-10-CM | POA: Diagnosis not present

## 2020-08-10 DIAGNOSIS — M79605 Pain in left leg: Secondary | ICD-10-CM

## 2020-08-10 DIAGNOSIS — M79604 Pain in right leg: Secondary | ICD-10-CM | POA: Insufficient documentation

## 2020-08-10 DIAGNOSIS — I272 Pulmonary hypertension, unspecified: Secondary | ICD-10-CM

## 2020-08-10 NOTE — Progress Notes (Signed)
Follow-up Outpatient Visit Date: 08/10/2020  Primary Care Provider: Barbaraann Boys, [place] [facility] [place] [address]  Chief Complaint: Follow-up pulmonary hypertension  HPI:  Mr. Kunath is a 80 y.o. male with history of pulmonary embolism with concern forCTEPH, as well as OSA on CPAP, dyslipidemia, prediabetes, and peptic ulcer disease, who presents for follow-up of pulmonary hypertension and palpitations.  I last saw Mr. Porreca in February, at which time he was concerned about an irregular heartbeat indicated by his blood pressure cuff.  He reported only rare palpitations that did not coincide with his mornings from the blood pressure cuff.  Chronic exertional dyspnea was slightly worse than at prior visits.  Given relative lack of symptoms and reassuring Holter monitor in 2019, we agreed to defer additional testing.  Today, Mr. Fidalgo reports that he has been feeling relatively well.  He notes some intermittent leg pain, particularly along the medial aspect of his left thigh.  This has been present for the last week and typically occurs at rest.  He also notes some tingling in his feet and distal legs.  He has been trying to elevate his legs some to help with this.  He feels like using his calcium supplement more regularly helps with his leg pain.  He does not have any pain or cramping in his legs with ambulation.  He has minimal lower extremity edema, which is actually better than at prior visits.  Mr. Cubit denies chest pain, palpitations, and lightheadedness.  He notes minimal shortness of breath, rarely feeling like he needs to gasp for air for a second or 2.  He has not had any orthopnea or PND, remains compliant with his CPAP.  He denies bleeding.  Mr. Vert has not been checking his blood pressure regularly at home.  He feels like irregular heartbeat readings are less frequent when he would check his blood  pressure.  --------------------------------------------------------------------------------------------------  Past Medical History:  Diagnosis Date  . Actinic keratosis   . 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 Basal cell carcinoma 04/18/2020   Right mid upper forehead  . 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[place])   . Dyslipidemia   . Edema   . Gastric ulcer   . OSA on CPAP   . Prediabetes   . Pulmonary embolism ([place])   . Seasonal allergies    Past Surgical History:  Procedure Laterality Date  . ENDARTERECTOMY  10/2018   Pulmonary endarterectomy for CTEPH - [facility]  . PULMONARY VENOGRAPHY Bilateral 01/08/2018   Procedure: PULMONARY VENOGRAPHY possible thrombolysis;  Surgeon: Katha Cabal, MD;  Location: [place] [facility];  Service: Cardiovascular;  Laterality: Bilateral;  . TONSILLECTOMY      Current Meds  Medication Sig  . B Complex-C (SUPER B COMPLEX PO) Take by mouth daily.  . busPIRone (BUSPAR) 5 MG tablet Take 5 mg by mouth 2 (two) times daily.  . Calcium Carbonate Antacid (CALCIUM CARBONATE, DOSED IN MG ELEMENTAL CALCIUM,) 1250 MG/5ML SUSP Take by mouth.  . Cholecalciferol (VITAMIN D) 2000 UNITS tablet Take 2,000 Units by mouth daily.  . potassium chloride (KLOR-CON) 10 MEQ tablet Take 40 mEq by mouth daily.   Marland Kitchen torsemide (DEMADEX) 20 MG tablet Take 2 tablets (40 mg total) by mouth daily.  Marland Kitchen warfarin (COUMADIN) 5 MG tablet TAKE 1 TABLET DAILY OR TAKE AS DIRECTED BY THE ANTICOAGULATION CLINIC.    Allergies: Sulfa antibiotics, Spironolactone, and Penicillins  Social History   Tobacco Use  . Smoking status: Former Smoker    Packs/day: 0.30    Years: 7.00    Pack years: 2.10    Types: Cigarettes    Quit date: 1967    Years since quitting: 54.6  . Smokeless tobacco: Never Used  Vaping Use  . Vaping Use: Never used  Substance Use Topics  . Alcohol use: Not  Currently  . Drug use: No    Family History  Problem Relation Age of Onset  . Stroke Mother   . Coronary artery disease Mother   . Osteoporosis Mother   . Rheum arthritis Mother   . Thyroid disease Mother     Review of Systems: A 12-system review of systems was performed and was negative except as noted in the HPI.  --------------------------------------------------------------------------------------------------  Physical Exam: BP 116/74 (BP Location: Left Arm, Patient Position: Sitting, Cuff Size: Normal)   Pulse 83   Ht 5\' 2"  (1.575 m)   Wt 224 lb (101.6 kg)   SpO2 96%   BMI 40.97 kg/m   General: NAD. Neck: No JVD or HJR. Lungs: Clear to auscultation bilaterally without wheezes or crackles. Heart: Regular rate and rhythm without murmurs, rubs, or gallops. Abdomen: Soft, nontender, nondistended. Extremities: Trace pretibial edema bilaterally.  2+ pedal pulses bilaterally.  EKG: Normal sinus rhythm with left axis deviation and right bundle branch block.  No significant change since 03/28/2020.  Lab Results  Component Value Date   WBC 7.8 03/28/2020   HGB 14.8 03/28/2020   HCT 44.2 03/28/2020   MCV 86.0 03/28/2020   PLT 411 (H) 03/28/2020    Lab Results  Component Value Date   NA 137 03/28/2020   K 3.1 (L) 03/28/2020   CL 99 03/28/2020   CO2 26 03/28/2020   BUN 29 (H) 03/28/2020   CREATININE 1.36 (H) 03/28/2020   GLUCOSE 115 (H) 03/28/2020   ALT 24 03/09/2019    No results found for: CHOL, HDL, LDLCALC, LDLDIRECT, TRIG, CHOLHDL  --------------------------------------------------------------------------------------------------  ASSESSMENT AND PLAN: CTEPH: Other than trace lower extremity edema that is chronic, Mr. Lucarelli appears euvolemic with stable NYHA class II symptoms.  We will defer medication changes today.  Mr. Weare should continue regular follow-up with Dr. Marijean Bravo, his [facility] specialist at [facility].  Continue indefinite anticoagulation with  warfarin.  Leg pain: Most likely musculoskeletal and/or neuropathic.  Quality and timing of pain as well as physical exam are not consistent with claudication/PAD.  An element of venous insufficiency and/or post thrombotic syndrome may be contributing as well.  Labs earlier this month were notable for borderline low potassium and mildly elevated creatinine.  Morbid obesity: BMI greater than 40.  Weight loss encouraged through diet and exercise.  Nelva Bush, MD 08/10/2020 9:52 AM

## 2020-08-10 NOTE — Progress Notes (Signed)
Daily Session Note  Patient Details  Name: Adam Knight MRN: [id number] Date of Birth: September 10, 1941 Referring Provider:     Pulmonary Rehab from 05/16/2020 in [facility] Cardiac and Pulmonary Rehab  Referring Provider [name] MD      Encounter Date: 08/10/2020  Check In:  Session Check In - 08/10/20 0831      Check-In   Supervising physician immediately available to respond to emergencies See telemetry face sheet for immediately available ER MD    Location [facility]-Cardiac & Pulmonary Rehab    Staff Present Heath Lark, RN, BSN, CCRP;Jessica [place], MA, RCEP, CCRP, CCET;Joseph [place], [place], ACSM CEP, Exercise Physiologist    Virtual Visit No    Medication changes reported     No    Fall or balance concerns reported    No    Warm-up and Cool-down Performed on first and last piece of equipment    Resistance Training Performed Yes    VAD Patient? No    PAD/SET Patient? No      Pain Assessment   Currently in Pain? No/denies              Social History   Tobacco Use  Smoking Status Former Smoker  . Packs/day: 0.30  . Years: 7.00  . Pack years: 2.10  . Types: Cigarettes  . Quit date: 31  . Years since quitting: 54.6  Smokeless Tobacco Never Used    Goals Met:  Proper associated with RPD/PD & O2 Sat Independence with exercise equipment Exercise tolerated well No report of cardiac concerns or symptoms  Goals Unmet:  Not Applicable  Comments: Pt able to follow exercise prescription today without complaint.  Will continue to monitor for progression.    Dr. Emily Filbert is Medical Director for [place] and [facility].

## 2020-08-10 NOTE — Patient Instructions (Signed)
Medication Instructions:  Your physician recommends that you continue on your current medications as directed. Please refer to the Current Medication list given to you today.  *If you need a refill on your cardiac medications before your next appointment, please call your pharmacy*  Follow-Up: At [facility], you and your health needs are our priority.  As part of our continuing mission to provide you with exceptional heart care, we have created designated Provider Care Teams.  These Care Teams include your primary Cardiologist (physician) and Advanced Practice Providers (APPs -  Physician Assistants and Nurse Practitioners) who all work together to provide you with the care you need, when you need it.  We recommend signing up for the patient portal called "MyChart".  Sign up information is provided on this After Visit Summary.  MyChart is used to connect with patients for Virtual Visits (Telemedicine).  Patients are able to view lab/test results, encounter notes, upcoming appointments, etc.  Non-urgent messages can be sent to your provider as well.   To learn more about what you can do with MyChart, go to https://www.mychart.com.    Your next appointment:   12 month(s)  The format for your next appointment:   In Person  Provider:    You may see Christopher End, MD or one of the following Advanced Practice Providers on your designated Care Team:    Christopher Berge, NP  Ryan Dunn, PA-C  Jacquelyn Visser, PA-C 

## 2020-08-15 ENCOUNTER — Other Ambulatory Visit: Payer: Self-pay

## 2020-08-15 ENCOUNTER — Ambulatory Visit (INDEPENDENT_AMBULATORY_CARE_PROVIDER_SITE_OTHER): Payer: Medicare Other

## 2020-08-15 DIAGNOSIS — I2693 Single subsegmental pulmonary embolism without acute cor pulmonale: Secondary | ICD-10-CM | POA: Diagnosis not present

## 2020-08-15 DIAGNOSIS — Z5181 Encounter for therapeutic drug level monitoring: Secondary | ICD-10-CM

## 2020-08-15 DIAGNOSIS — I2602 Saddle embolus of pulmonary artery with acute cor pulmonale: Secondary | ICD-10-CM

## 2020-08-15 LAB — POCT INR: INR: 2.2 (ref 2.0–3.0)

## 2020-08-15 NOTE — Patient Instructions (Signed)
-   continue warfarin dosage of 1 tablet (5 mg) every day EXCEPT 1.5 TABLETS ON MONDAYS & FRIDAYS. - Recheck as scheduled

## 2020-08-17 ENCOUNTER — Telehealth: Payer: Self-pay | Admitting: *Deleted

## 2020-08-17 NOTE — Telephone Encounter (Signed)
Called to check on pt.  Out since 8/18. Left message

## 2020-08-22 ENCOUNTER — Encounter: Payer: Medicare Other | Admitting: *Deleted

## 2020-08-22 ENCOUNTER — Other Ambulatory Visit: Payer: Self-pay

## 2020-08-22 DIAGNOSIS — R0609 Other forms of dyspnea: Secondary | ICD-10-CM

## 2020-08-22 DIAGNOSIS — I272 Pulmonary hypertension, unspecified: Secondary | ICD-10-CM

## 2020-08-22 DIAGNOSIS — I2724 Chronic thromboembolic pulmonary hypertension: Secondary | ICD-10-CM | POA: Diagnosis not present

## 2020-08-22 NOTE — Progress Notes (Signed)
Daily Session Note  Patient Details  Name: Adam Knight MRN: [id number] Date of Birth: 1941-09-06 Referring Provider:     Pulmonary Rehab from 05/16/2020 in [facility] Cardiac and Pulmonary Rehab  Referring Provider [name] MD      Encounter Date: 08/22/2020  Check In:  Session Check In - 08/22/20 0834      Check-In   Supervising physician immediately available to respond to emergencies See telemetry face sheet for immediately available ER MD    Staff Present Heath Lark, RN, BSN, CCRP;Joseph Tedd Sias, [place], ACSM CEP, Exercise Physiologist    Virtual Visit No    Medication changes reported     No    Fall or balance concerns reported    No    Warm-up and Cool-down Performed on first and last piece of equipment    Resistance Training Performed Yes    VAD Patient? No    PAD/SET Patient? No      Pain Assessment   Currently in Pain? No/denies              Social History   Tobacco Use  Smoking Status Former Smoker  . Packs/day: 0.30  . Years: 7.00  . Pack years: 2.10  . Types: Cigarettes  . Quit date: 1  . Years since quitting: 54.6  Smokeless Tobacco Never Used    Goals Met:  Proper associated with RPD/PD & O2 Sat Independence with exercise equipment Exercise tolerated well No report of cardiac concerns or symptoms  Goals Unmet:  Not Applicable  Comments: Pt able to follow exercise prescription today without complaint.  Will continue to monitor for progression.    Dr. Emily Filbert is Medical Director for [place] and [facility].

## 2020-08-24 ENCOUNTER — Encounter: Payer: Medicare Other | Attending: Internal Medicine | Admitting: *Deleted

## 2020-08-24 ENCOUNTER — Other Ambulatory Visit: Payer: Self-pay

## 2020-08-24 VITALS — Ht 63.5 in | Wt 220.4 lb

## 2020-08-24 DIAGNOSIS — Z79899 Other long term (current) drug therapy: Secondary | ICD-10-CM | POA: Insufficient documentation

## 2020-08-24 DIAGNOSIS — I272 Pulmonary hypertension, unspecified: Secondary | ICD-10-CM

## 2020-08-24 DIAGNOSIS — Z9989 Dependence on other enabling machines and devices: Secondary | ICD-10-CM | POA: Insufficient documentation

## 2020-08-24 DIAGNOSIS — I2724 Chronic thromboembolic pulmonary hypertension: Secondary | ICD-10-CM | POA: Diagnosis present

## 2020-08-24 DIAGNOSIS — R7303 Prediabetes: Secondary | ICD-10-CM | POA: Diagnosis not present

## 2020-08-24 DIAGNOSIS — Z7901 Long term (current) use of anticoagulants: Secondary | ICD-10-CM | POA: Diagnosis not present

## 2020-08-24 DIAGNOSIS — Z87891 Personal history of nicotine dependence: Secondary | ICD-10-CM | POA: Insufficient documentation

## 2020-08-24 DIAGNOSIS — G4733 Obstructive sleep apnea (adult) (pediatric): Secondary | ICD-10-CM | POA: Diagnosis not present

## 2020-08-24 DIAGNOSIS — E785 Hyperlipidemia, unspecified: Secondary | ICD-10-CM | POA: Diagnosis not present

## 2020-08-24 DIAGNOSIS — I2699 Other pulmonary embolism without acute cor pulmonale: Secondary | ICD-10-CM | POA: Insufficient documentation

## 2020-08-24 NOTE — Progress Notes (Signed)
Daily Session Note  Patient Details  Name: Adam Knight MRN: [id number] Date of Birth: 07-Jul-1941 Referring Provider:     Pulmonary Rehab from 05/16/2020 in [facility] Cardiac and Pulmonary Rehab  Referring Provider [name] MD      Encounter Date: 08/24/2020  Check In:  Session Check In - 08/24/20 0831      Check-In   Supervising physician immediately available to respond to emergencies See telemetry face sheet for immediately available ER MD    Location [facility]-Cardiac & Pulmonary Rehab    Staff Present Heath Lark, RN, BSN, CCRP;Joseph Hood RCP,RRT,BSRT;Jessica [place], [place], [place], [place], CCET    Virtual Visit No    Medication changes reported     No    Fall or balance concerns reported    No    Warm-up and Cool-down Performed on first and last piece of equipment    Resistance Training Performed Yes    VAD Patient? No    PAD/SET Patient? No      Pain Assessment   Currently in Pain? No/denies              Social History   Tobacco Use  Smoking Status Former Smoker  . Packs/day: 0.30  . Years: 7.00  . Pack years: 2.10  . Types: Cigarettes  . Quit date: 95  . Years since quitting: 54.7  Smokeless Tobacco Never Used    Goals Met:  Proper associated with RPD/PD & O2 Sat Independence with exercise equipment Exercise tolerated well No report of cardiac concerns or symptoms  Goals Unmet:  Not Applicable  Comments: Pt able to follow exercise prescription today without complaint.  Will continue to monitor for progression.    Dr. Emily Filbert is Medical Director for [place] and [facility].

## 2020-08-25 ENCOUNTER — Ambulatory Visit (INDEPENDENT_AMBULATORY_CARE_PROVIDER_SITE_OTHER): Payer: Medicare Other

## 2020-08-25 ENCOUNTER — Other Ambulatory Visit: Payer: Self-pay

## 2020-08-25 ENCOUNTER — Ambulatory Visit: Payer: Medicare Other

## 2020-08-25 DIAGNOSIS — L570 Actinic keratosis: Secondary | ICD-10-CM | POA: Diagnosis not present

## 2020-08-25 MED ORDER — AMINOLEVULINIC ACID HCL 20 % EX SOLR
1.0000 "application " | Freq: Once | CUTANEOUS | Status: AC
  Administered 2020-08-25: 354 mg via TOPICAL

## 2020-08-25 NOTE — Patient Instructions (Signed)

## 2020-08-25 NOTE — Progress Notes (Signed)

## 2020-08-31 ENCOUNTER — Encounter: Payer: Medicare Other | Admitting: *Deleted

## 2020-08-31 ENCOUNTER — Encounter: Payer: Self-pay | Admitting: *Deleted

## 2020-08-31 ENCOUNTER — Other Ambulatory Visit: Payer: Self-pay

## 2020-08-31 DIAGNOSIS — I2724 Chronic thromboembolic pulmonary hypertension: Secondary | ICD-10-CM | POA: Diagnosis not present

## 2020-08-31 DIAGNOSIS — I272 Pulmonary hypertension, unspecified: Secondary | ICD-10-CM

## 2020-08-31 NOTE — Progress Notes (Signed)
Daily Session Note  Patient Details  Name: Adam Knight MRN: [id number] Date of Birth: 08/23/1941 Referring Provider:     Pulmonary Rehab from 05/16/2020 in [facility] Cardiac and Pulmonary Rehab  Referring Provider Ford, Hubert MD      Encounter Date: 08/31/2020  Check In:  Session Check In - 08/31/20 0759      Check-In   Supervising physician immediately available to respond to emergencies See telemetry face sheet for immediately available ER MD    Location [facility]-Cardiac & Pulmonary Rehab    Staff Present  , RN BSN;Joseph Hood RCP,RRT,BSRT;Amanda Sommer, BA, ACSM CEP, Exercise Physiologist    Virtual Visit No    Fall or balance concerns reported    No    Warm-up and Cool-down Performed on first and last piece of equipment    Resistance Training Performed Yes    VAD Patient? No    PAD/SET Patient? No      Pain Assessment   Currently in Pain? No/denies              Social History   Tobacco Use  Smoking Status Former Smoker  . Packs/day: 0.30  . Years: 7.00  . Pack years: 2.10  . Types: Cigarettes  . Quit date: 1967  . Years since quitting: 54.7  Smokeless Tobacco Never Used    Goals Met:  Independence with exercise equipment Exercise tolerated well No report of cardiac concerns or symptoms Strength training completed today  Goals Unmet:  Not Applicable  Comments: Pt able to follow exercise prescription today without complaint.  Will continue to monitor for progression.    Dr. Mark Miller is Medical Director for [facility] and [facility]. 

## 2020-08-31 NOTE — Progress Notes (Signed)
Pulmonary Individual Treatment Plan  Patient Details  Name: Harjot Dibello MRN: [id number] Date of Birth: 10-08-1941 Referring Provider:     Pulmonary Rehab from 05/16/2020 in [facility] Cardiac and Pulmonary Rehab  Referring Provider [name] MD      Initial Encounter Date:    Pulmonary Rehab from 05/16/2020 in [facility] Cardiac and Pulmonary Rehab  Date 05/16/20      Visit Diagnosis: Pulmonary hypertension ([place])  Patient's Home Medications on Admission:  Current Outpatient Medications:  .  B Complex-C (SUPER B COMPLEX PO), Take by mouth daily., Disp: , Rfl:  .  busPIRone (BUSPAR) 5 MG tablet, Take 5 mg by mouth 2 (two) times daily., Disp: , Rfl:  .  Calcium Carbonate Antacid (CALCIUM CARBONATE, DOSED IN MG ELEMENTAL CALCIUM,) 1250 MG/5ML SUSP, Take by mouth., Disp: , Rfl:  .  Cholecalciferol (VITAMIN D) 2000 UNITS tablet, Take 2,000 Units by mouth daily., Disp: , Rfl:  .  potassium chloride (KLOR-CON) 10 MEQ tablet, Take 40 mEq by mouth daily. , Disp: , Rfl:  .  torsemide (DEMADEX) 20 MG tablet, Take 2 tablets (40 mg total) by mouth daily., Disp: 180 tablet, Rfl: 1 .  warfarin (COUMADIN) 5 MG tablet, TAKE 1 TABLET DAILY OR TAKE AS DIRECTED BY THE ANTICOAGULATION CLINIC., Disp: 90 tablet, Rfl: 0  Past Medical History: Past Medical History:  Diagnosis Date  . Actinic keratosis   . 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 Basal cell carcinoma 04/18/2020   Right mid upper forehead  . 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 Dyslipidemia   . Edema   . Gastric ulcer   . OSA on CPAP   . Prediabetes   . Pulmonary embolism ([place])   . Seasonal allergies     Tobacco Use: Social History   Tobacco Use  Smoking Status Former Smoker  . Packs/day: 0.30  . Years: 7.00  . Pack years: 2.10  . Types: Cigarettes  . Quit date: 52  . Years since quitting: 54.7  Smokeless Tobacco Never Used     Labs: Recent Review Flowsheet Data   There is no flowsheet data to display.      Pulmonary Assessment Scores:  Pulmonary Assessment Scores    Row Name 05/16/20 1005 08/24/20 0823       ADL UCSD   ADL Phase Entry Exit    SOB Score total 28 10    Rest 0 0    Walk 1 0    Stairs 3 1    Bath 0 0    Dress 1 0    Shop 1 0      CAT Score   CAT Score 11 6      mMRC Score   mMRC Score 2 2           UCSD: Self-administered rating of dyspnea associated with activities of daily living (ADLs) 6-point scale (0 = "not at all" to 5 = "maximal or unable to do because of breathlessness")  Scoring Scores range from 0 to 120.  Minimally important difference is 5 units  CAT: CAT can identify the health impairment of COPD patients and is better correlated with disease progression.  CAT has a scoring range of zero to 40. The CAT score is classified into four groups of low (less than 10), medium (10 - 20), high (21-30) and very high (31-40) based on the impact level of disease on health status.  A CAT score over 10 suggests significant symptoms.  A worsening CAT score could be explained by an exacerbation, poor medication adherence, poor inhaler technique, or progression of COPD or comorbid conditions.  CAT MCID is 2 points  mMRC: mMRC (Modified Medical Research Council) Dyspnea Scale is used to assess the degree of baseline functional disability in patients of respiratory disease due to dyspnea. No minimal important difference is established. A decrease in score of 1 point or greater is considered a positive change.   Pulmonary Function Assessment:  Pulmonary Function Assessment - 05/12/20 0939      Breath   Shortness of Breath Yes;Limiting activity           Exercise Target Goals: Exercise Program Goal: Individual exercise prescription set using results from initial 6 min walk test and THRR while considering  patient's activity barriers and safety.   Exercise Prescription  Goal: Initial exercise prescription builds to 30-45 minutes a day of aerobic activity, 2-3 days per week.  Home exercise guidelines will be given to patient during program as part of exercise prescription that the participant will acknowledge.  Education: Aerobic Exercise & Resistance Training: - Gives group verbal and written instruction on the various components of exercise. Focuses on aerobic and resistive training programs and the benefits of this training and how to safely progress through these programs..   Pulmonary Rehab from 08/31/2020 in [facility] Cardiac and Pulmonary Rehab  Date 08/10/20  Educator [facility]  Instruction Review Code 1- Verbalizes Understanding      Education: Exercise & Equipment Safety: - Individual verbal instruction and demonstration of equipment use and safety with use of the equipment.   Pulmonary Rehab from 08/31/2020 in [facility] Cardiac and Pulmonary Rehab  Date 05/12/20  Educator [facility]  Instruction Review Code 1- Verbalizes Understanding      Education: Exercise Physiology & General Exercise Guidelines: - Group verbal and written instruction with models to review the exercise physiology of the cardiovascular system and associated critical values. Provides general exercise guidelines with specific guidelines to those with heart or lung disease.    Pulmonary Rehab from 08/31/2020 in [facility] Cardiac and Pulmonary Rehab  Date 07/06/20  Educator [facility]  Instruction Review Code 1- Verbalizes Understanding      Education: Flexibility, Balance, Mind/Body Relaxation: Provides group verbal/written instruction on the benefits of flexibility and balance training, including mind/body exercise modes such as yoga, pilates and tai chi.  Demonstration and skill practice provided.   Activity Barriers & Risk Stratification:  Activity Barriers & Cardiac Risk Stratification - 05/16/20 0954      Activity Barriers & Cardiac Risk Stratification   Activity Barriers Deconditioning;Arthritis;Muscular  Weakness;Shortness of Breath;History of Falls;Balance Concerns;Joint Problems           6 Minute Walk:  6 Minute Walk    Row Name 05/16/20 0950 08/24/20 0822       6 Minute Walk   Phase Initial Discharge    Distance 860 feet 1000 feet    Distance % Change -- 16.2 %    Distance Feet Change -- 140 ft    Walk Time 6 minutes 6 minutes    # of Rest Breaks 0 0    MPH 1.63 1.9    METS 1.15 1.81    RPE 13 13    Perceived Dyspnea  3 3    VO2 Peak 4.02 6.33    Symptoms Yes (comment) No    Comments SOB SOB    Resting HR 79 bpm 89 bpm  Resting BP 124/66 134/72    Resting Oxygen Saturation  95 % 93 %    Exercise Oxygen Saturation  during 6 min walk 92 % 90 %    Max Ex. HR 110 bpm 123 bpm    Max Ex. BP 136/70 162/74    2 Minute Post BP 124/68 134/72      Interval HR   1 Minute HR 98 --    2 Minute HR 100 --    3 Minute HR 108 --    4 Minute HR 107 --    5 Minute HR 109 --    6 Minute HR 110 --    2 Minute Post HR 94 --    Interval Heart Rate? Yes --      Interval Oxygen   Interval Oxygen? Yes --    Baseline Oxygen Saturation % 95 % --    1 Minute Oxygen Saturation % 93 % --    1 Minute Liters of Oxygen 0 L  Room Air --    2 Minute Oxygen Saturation % 92 % --    2 Minute Liters of Oxygen 0 L --    3 Minute Oxygen Saturation % 93 % --    3 Minute Liters of Oxygen 0 L --    4 Minute Oxygen Saturation % 94 % --    4 Minute Liters of Oxygen 0 L --    5 Minute Oxygen Saturation % 94 % --    5 Minute Liters of Oxygen 0 L --    6 Minute Oxygen Saturation % 95 % --    6 Minute Liters of Oxygen 0 L --    2 Minute Post Oxygen Saturation % 96 % --    2 Minute Post Liters of Oxygen 0 L --          Oxygen Initial Assessment:  Oxygen Initial Assessment - 05/12/20 0939      Home Oxygen   Home Oxygen Device None    Sleep Oxygen Prescription CPAP    Liters per minute 0    Home Exercise Oxygen Prescription None    Home at Rest Exercise Oxygen Prescription None    Compliance  with Home Oxygen Use Yes      Initial 6 min Walk   Oxygen Used None      Program Oxygen Prescription   Program Oxygen Prescription None      Intervention   Short Term Goals To learn and exhibit compliance with exercise, home and travel O2 prescription;To learn and understand importance of monitoring SPO2 with pulse oximeter and demonstrate accurate use of the pulse oximeter.;To learn and understand importance of maintaining oxygen saturations>88%;To learn and demonstrate proper pursed lip breathing techniques or other breathing techniques.;To learn and demonstrate proper use of respiratory medications    Long  Term Goals Exhibits compliance with exercise, home and travel O2 prescription;Verbalizes importance of monitoring SPO2 with pulse oximeter and return demonstration;Maintenance of O2 saturations>88%;Exhibits proper breathing techniques, such as pursed lip breathing or other method taught during program session;Compliance with respiratory medication;Demonstrates proper use of MDI's           Oxygen Re-Evaluation:  Oxygen Re-Evaluation    Row Name 05/20/20 0824 06/13/20 0823 07/13/20 0825 08/22/20 0824       Program Oxygen Prescription   Program Oxygen Prescription None None None None      Home Oxygen   Home Oxygen Device None None None None    Sleep Oxygen Prescription  CPAP CPAP CPAP CPAP    Liters per minute 0 -- 0 0    Home Exercise Oxygen Prescription None None None None    Home at Rest Exercise Oxygen Prescription None None None None    Compliance with Home Oxygen Use Yes Yes Yes Yes      Goals/Expected Outcomes   Short Term Goals To learn and demonstrate proper pursed lip breathing techniques or other breathing techniques. To learn and demonstrate proper pursed lip breathing techniques or other breathing techniques. To learn and demonstrate proper pursed lip breathing techniques or other breathing techniques. To learn and understand importance of monitoring SPO2 with  pulse oximeter and demonstrate accurate use of the pulse oximeter.;To learn and understand importance of maintaining oxygen saturations>88%;To learn and demonstrate proper pursed lip breathing techniques or other breathing techniques.;To learn and exhibit compliance with exercise, home and travel O2 prescription    Long  Term Goals Exhibits proper breathing techniques, such as pursed lip breathing or other method taught during program session Exhibits proper breathing techniques, such as pursed lip breathing or other method taught during program session Exhibits proper breathing techniques, such as pursed lip breathing or other method taught during program session Exhibits compliance with exercise, home and travel O2 prescription;Verbalizes importance of monitoring SPO2 with pulse oximeter and return demonstration;Maintenance of O2 saturations>88%;Exhibits proper breathing techniques, such as pursed lip breathing or other method taught during program session;Compliance with respiratory medication    Comments Reviewed PLB technique with pt.  Talked about how it works and it's importance in maintaining their exercise saturations. Reviewed reasosn for PLB.  O2 has been above 88 with exercise. Mitch does use PLB once in a while as needed. He does not have a pulse oximeter to check his oxygen saturation at home. Explained why it is important to have one. Reviewed that oxygen saturations should be 88 percent and above. Patient has a pulse oximeter at home to check his oxygen.    Goals/Expected Outcomes Short: Become more profiecient at using PLB.   Long: Become independent at using PLB. Short: monitor oxygen at home and use PLB Long:  become independent at using PLB Short:  monitor oxygen at home and use PLB Long: manage shortness of breath and use PLB Short: monitor oxygen at home with exertion. Long: maintain oxygen saturations above 88 percent independently.           Oxygen Discharge (Final Oxygen  Re-Evaluation):  Oxygen Re-Evaluation - 08/22/20 0824      Program Oxygen Prescription   Program Oxygen Prescription None      Home Oxygen   Home Oxygen Device None    Sleep Oxygen Prescription CPAP    Liters per minute 0    Home Exercise Oxygen Prescription None    Home at Rest Exercise Oxygen Prescription None    Compliance with Home Oxygen Use Yes      Goals/Expected Outcomes   Short Term Goals To learn and understand importance of monitoring SPO2 with pulse oximeter and demonstrate accurate use of the pulse oximeter.;To learn and understand importance of maintaining oxygen saturations>88%;To learn and demonstrate proper pursed lip breathing techniques or other breathing techniques.;To learn and exhibit compliance with exercise, home and travel O2 prescription    Long  Term Goals Exhibits compliance with exercise, home and travel O2 prescription;Verbalizes importance of monitoring SPO2 with pulse oximeter and return demonstration;Maintenance of O2 saturations>88%;Exhibits proper breathing techniques, such as pursed lip breathing or other method taught during program session;Compliance with respiratory  medication    Comments He does not have a pulse oximeter to check his oxygen saturation at home. Explained why it is important to have one. Reviewed that oxygen saturations should be 88 percent and above. Patient has a pulse oximeter at home to check his oxygen.    Goals/Expected Outcomes Short: monitor oxygen at home with exertion. Long: maintain oxygen saturations above 88 percent independently.           Initial Exercise Prescription:  Initial Exercise Prescription - 05/16/20 0900      Date of Initial Exercise RX and Referring Provider   Date 05/16/20    Referring Provider [name] MD      Treadmill   MPH 1.2    Grade 0.5    Minutes 15    METs 2      Recumbant Bike   Level 1    RPM 50    Watts 11    Minutes 15    METs 2      NuStep   Level 1    SPM 80     Minutes 15    METs 2      T5 Nustep   Level 1    SPM 80    Minutes 15    METs 2      Prescription Details   Frequency (times per week) 2    Duration Progress to 30 minutes of continuous aerobic without signs/symptoms of physical distress      Intensity   THRR 40-80% of Max Heartrate 104-129    Ratings of Perceived Exertion 11-13    Perceived Dyspnea 0-4      Progression   Progression Continue to progress workloads to maintain intensity without signs/symptoms of physical distress.      Resistance Training   Training Prescription Yes    Weight 3 lb    Reps 10-15           Perform Capillary Blood Glucose checks as needed.  Exercise Prescription Changes:  Exercise Prescription Changes    Row Name 05/16/20 0900 05/24/20 1600 06/07/20 1500 06/21/20 1300 07/05/20 1300     Response to Exercise   Blood Pressure (Admit) 124/66 126/80 104/62 146/84 142/80   Blood Pressure (Exercise) 136/70 162/78 142/60 144/72 162/78   Blood Pressure (Exit) 124/68 126/64 118/70 124/70 130/70   Heart Rate (Admit) 79 bpm 100 bpm 82 bpm 89 bpm 84 bpm   Heart Rate (Exercise) 110 bpm 113 bpm 90 bpm 90 bpm 96 bpm   Heart Rate (Exit) 93 bpm 98 bpm 94 bpm 93 bpm 92 bpm   Oxygen Saturation (Admit) 95 % 98 % 93 % 93 % 94 %   Oxygen Saturation (Exercise) 92 % 95 % 94 % 93 % 93 %   Oxygen Saturation (Exit) 95 % 93 % 93 % 95 % 92 %   Rating of Perceived Exertion (Exercise) 13 13 12 12 13    Perceived Dyspnea (Exercise) 3 1 -- 1 1   Symptoms SOB -- -- SOB --   Comments walk test results first full day of exercise -- -- --   Duration -- Progress to 30 minutes of  aerobic without signs/symptoms of physical distress Continue with 30 min of aerobic exercise without signs/symptoms of physical distress. Continue with 30 min of aerobic exercise without signs/symptoms of physical distress. Continue with 30 min of aerobic exercise without signs/symptoms of physical distress.   Intensity -- THRR unchanged THRR  unchanged THRR unchanged THRR unchanged  Progression   Progression -- Continue to progress workloads to maintain intensity without signs/symptoms of physical distress. Continue to progress workloads to maintain intensity without signs/symptoms of physical distress. Continue to progress workloads to maintain intensity without signs/symptoms of physical distress. Continue to progress workloads to maintain intensity without signs/symptoms of physical distress.   Average METs -- 2 2.43 1.67 2     Resistance Training   Training Prescription -- Yes Yes Yes Yes   Weight -- 3 lb 4 lb 4 lb 4 lb   Reps -- 10-15 10-15 10-15 10-15     Interval Training   Interval Training -- No No No No     Treadmill   MPH -- 1.2 -- 1.4 1.4   Grade -- 0.5 -- 0.5 0.5   Minutes -- 15 -- 15 15   METs -- 2 -- 2.17 2.17     Recumbant Bike   Level -- -- 1 1 --   RPM -- -- 50 -- --   Minutes -- -- 15 15 --   METs -- -- 2.43 -- --     NuStep   Level -- -- 2 2 --   SPM -- -- 80 -- --   Minutes -- -- 15 15 --   METs -- -- -- 1.4 --     Arm Ergometer   Level -- -- -- 1 --   Minutes -- -- -- 15 --   METs -- -- -- 1.3 --     T5 Nustep   Level -- 1 -- -- 2   SPM -- -- -- -- 80   Minutes -- 15 -- -- 15   METs -- -- -- -- 1.8     Home Exercise Plan   Plans to continue exercise at -- -- -- Home (comment)  walking Home (comment)  walking   Frequency -- -- -- Add 2 additional days to program exercise sessions. Add 2 additional days to program exercise sessions.   Initial Home Exercises Provided -- -- -- 06/08/20 06/08/20   Row Name 07/19/20 1400 08/02/20 1300 08/17/20 0900 08/30/20 1300       Response to Exercise   Blood Pressure (Admit) 132/80 132/60 126/64 134/72    Blood Pressure (Exercise) 128/74 130/80 138/78 162/74    Blood Pressure (Exit) 112/62 128/70 124/64 120/84    Heart Rate (Admit) 86 bpm 89 bpm 78 bpm 89 bpm    Heart Rate (Exercise) 109 bpm 96 bpm 90 bpm 123 bpm    Heart Rate (Exit) 93 bpm  99 bpm 85 bpm 92 bpm    Oxygen Saturation (Admit) 94 % 96 % 93 % 93 %    Oxygen Saturation (Exercise) 94 % 94 % 92 % 90 %    Oxygen Saturation (Exit) 93 % 96 % 94 % 94 %    Rating of Perceived Exertion (Exercise) 13 13 14 13     Perceived Dyspnea (Exercise) 0 1 1 3     Symptoms none none none none    Duration Continue with 30 min of aerobic exercise without signs/symptoms of physical distress. Continue with 30 min of aerobic exercise without signs/symptoms of physical distress. Continue with 30 min of aerobic exercise without signs/symptoms of physical distress. Continue with 30 min of aerobic exercise without signs/symptoms of physical distress.    Intensity THRR unchanged THRR unchanged THRR unchanged THRR unchanged      Progression   Progression Continue to progress workloads to maintain intensity without signs/symptoms of physical distress.  Continue to progress workloads to maintain intensity without signs/symptoms of physical distress. Continue to progress workloads to maintain intensity without signs/symptoms of physical distress. Continue to progress workloads to maintain intensity without signs/symptoms of physical distress.    Average METs 1.99 1.8 2 2.8      Resistance Training   Training Prescription Yes Yes Yes Yes    Weight 5 lb 5 lb 5 lb 5 lb    Reps 10-15 10-15 10-15 10-15      Interval Training   Interval Training No No No No      Treadmill   MPH 1.4 -- -- --    Grade 0.5 -- -- --    Minutes 15 -- -- --    METs 2.17 -- -- --      NuStep   Level $Remo'2 2 3 3    'znMoi$ SPM -- 80 -- 80    Minutes $Remove'15 15 15 15    'IPvdaVI$ METs 2 1.6 2 2.8      T5 Nustep   Level 2 -- -- --    Minutes 15 -- -- --    METs 1.8 -- -- --      Biostep-RELP   Level $Remo'3 3 3 'kDVuM$ --    SPM -- 50 -- --    Minutes $Remove'15 15 15 'bikIcrA$ --    METs $Rem'2 2 2 'paIL$ --      Home Exercise Plan   Plans to continue exercise at Home (comment)  walking -- Home (comment)  walking Home (comment)  walking    Frequency Add 2 additional days to program  exercise sessions. -- Add 2 additional days to program exercise sessions. Add 2 additional days to program exercise sessions.    Initial Home Exercises Provided 06/08/20 -- 06/08/20 06/08/20           Exercise Comments:  Exercise Comments    Row Name 05/20/20 [phone]           Exercise Comments First full day of exercise!  Patient was oriented to gym and equipment including functions, settings, policies, and procedures.  Patient's individual exercise prescription and treatment plan were reviewed.  All starting workloads were established based on the results of the 6 minute walk test done at initial orientation visit.  The plan for exercise progression was also introduced and progression will be customized based on patient's performance and goals.              Exercise Goals and Review:  Exercise Goals    Row Name 05/16/20 0956             Exercise Goals   Increase Physical Activity Yes       Intervention Provide advice, education, support and counseling about physical activity/exercise needs.;Develop an individualized exercise prescription for aerobic and resistive training based on initial evaluation findings, risk stratification, comorbidities and participant's personal goals.       Expected Outcomes Long Term: Add in home exercise to make exercise part of routine and to increase amount of physical activity.;Short Term: Attend rehab on a regular basis to increase amount of physical activity.;Long Term: Exercising regularly at least 3-5 days a week.       Increase Strength and Stamina Yes       Intervention Provide advice, education, support and counseling about physical activity/exercise needs.;Develop an individualized exercise prescription for aerobic and resistive training based on initial evaluation findings, risk stratification, comorbidities and participant's personal goals.       Expected Outcomes  Short Term: Increase workloads from initial exercise prescription for  resistance, speed, and METs.;Short Term: Perform resistance training exercises routinely during rehab and add in resistance training at home;Long Term: Improve cardiorespiratory fitness, muscular endurance and strength as measured by increased METs and functional capacity (6MWT)       Able to understand and use rate of perceived exertion (RPE) scale Yes       Intervention Provide education and explanation on how to use RPE scale       Expected Outcomes Short Term: Able to use RPE daily in rehab to express subjective intensity level;Long Term:  Able to use RPE to guide intensity level when exercising independently       Able to understand and use Dyspnea scale Yes       Intervention Provide education and explanation on how to use Dyspnea scale       Expected Outcomes Long Term: Able to use Dyspnea scale to guide intensity level when exercising independently;Short Term: Able to use Dyspnea scale daily in rehab to express subjective sense of shortness of breath during exertion       Knowledge and understanding of Target Heart Rate Range (THRR) Yes       Intervention Provide education and explanation of THRR including how the numbers were predicted and where they are located for reference       Expected Outcomes Short Term: Able to state/look up THRR;Short Term: Able to use daily as guideline for intensity in rehab;Long Term: Able to use THRR to govern intensity when exercising independently       Able to check pulse independently Yes       Intervention Provide education and demonstration on how to check pulse in carotid and radial arteries.;Review the importance of being able to check your own pulse for safety during independent exercise       Expected Outcomes Short Term: Able to explain why pulse checking is important during independent exercise;Long Term: Able to check pulse independently and accurately       Understanding of Exercise Prescription Yes       Intervention Provide education, explanation,  and written materials on patient's individual exercise prescription       Expected Outcomes Short Term: Able to explain program exercise prescription;Long Term: Able to explain home exercise prescription to exercise independently              Exercise Goals Re-Evaluation :  Exercise Goals Re-Evaluation    Row Name 05/20/20 4403 05/24/20 1559 06/07/20 1506 06/08/20 0851 06/21/20 1351     Exercise Goal Re-Evaluation   Exercise Goals Review Able to understand and use rate of perceived exertion (RPE) scale;Able to understand and use Dyspnea scale;Knowledge and understanding of Target Heart Rate Range (THRR);Understanding of Exercise Prescription Increase Physical Activity;Increase Strength and Stamina;Understanding of Exercise Prescription Increase Physical Activity;Increase Strength and Stamina;Understanding of Exercise Prescription Increase Physical Activity;Increase Strength and Stamina;Understanding of Exercise Prescription Increase Physical Activity;Increase Strength and Stamina;Understanding of Exercise Prescription   Comments Reviewed RPE and dyspnea scales, THR and program prescription with pt today.  Pt voiced understanding and was given a copy of goals to take home. Karn Pickler has completed his first full day of exercise.  We will continue to monitor his progress. Karn Pickler has increased workloads on T4 and weights to 4 lb for strength work. Reviewed home exercise with pt today.  Pt plans to walk/consider going to [facility] for exercise.  Reviewed THR, pulse, RPE, sign and symptoms, pulse oximetery and when  to call 911 or MD.  Also discussed weather considerations and indoor options.  Pt voiced understanding. Karn Pickler has been doing well in rehab.  He is up to level 2 on the T5 Nustep.  We will continue to monitor his progres.   Expected Outcomes Short: Use RPE daily to regulate intensity. Long: Follow program prescription in THR. Short: Continue to attend rehab regularly Long: Continue follow program  prescription. Short : exercise consistently Long: continue to improve MET level Short:  add in one day of exercise outside program sessions Short: Start to increase seated equipment.  Long: Continue to improve stamina.   [place] Name 07/05/20 1356 07/13/20 0804 07/19/20 1400 08/02/20 1402 08/16/20 1358     Exercise Goal Re-Evaluation   Exercise Goals Review Increase Physical Activity;Increase Strength and Stamina;Understanding of Exercise Prescription Increase Physical Activity;Increase Strength and Stamina;Understanding of Exercise Prescription Increase Physical Activity;Increase Strength and Stamina;Understanding of Exercise Prescription Increase Physical Activity;Increase Strength and Stamina;Understanding of Exercise Prescription Increase Physical Activity;Increase Strength and Stamina;Understanding of Exercise Prescription   Comments Karn Pickler continues to tolerate exercise well.  He attended  consistently in June  but missed some sessions first part of July. Karn Pickler states he can do more at home since he started exercising.  He sees Dr today about leg pain. Karn Pickler has been doing well in rehab.  He is now up to 2 METs on both BioStep and NuStep.  He has also increased to 5 lb weights. We will continue to monitor his progress. Karn Pickler attends consistently.  Oxygen stas stay in the 90 during exercise. Staff will monitor progress. Karn Pickler is doing well in rehab.  He is nearing graduation and should improve on his post 6MWT.  We will continue to monitor his progress.   Expected Outcomes Short: get back to consistent attendance Long: increase stamina and MET level Short: attend consistently Long: increase overall stamina Short: Continue to boost workloads long; Continue to improve stamina. Short: continue to attend consistently Long: increase MET level Short: Improve post 6MWT Long: Continue to exercise independently   [place] Name 08/22/20 4854 08/30/20 1401           Exercise Goal Re-Evaluation   Exercise Goals Review  Increase Physical Activity;Increase Strength and Stamina Increase Physical Activity;Increase Strength and Stamina      Comments Karn Pickler is working at at home sometimes and is able to do weights and walking. He wants to join a gym when he is done with the program. He wants to try to join [employer] that is close to him. Karn Pickler is nearing graduation.  He improved his post 6MWT by 140 feet.      Expected Outcomes Short: Graduate [facility]. 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 Short: complete LW Long:  maintain fitness on his own             Discharge Exercise Prescription (Final Exercise Prescription Changes):  Exercise Prescription Changes - 08/30/20 1300      Response to Exercise   Blood Pressure (Admit) 134/72    Blood Pressure (Exercise) 162/74    Blood Pressure (Exit) 120/84    Heart Rate (Admit) 89 bpm    Heart Rate (Exercise) 123 bpm    Heart Rate (Exit) 92 bpm    Oxygen Saturation (Admit) 93 %    Oxygen Saturation (Exercise) 90 %    Oxygen Saturation (Exit) 94 %    Rating of Perceived Exertion (Exercise) 13    Perceived Dyspnea (Exercise) 3    Symptoms none  Duration Continue with 30 min of aerobic exercise without signs/symptoms of physical distress.    Intensity THRR unchanged      Progression   Progression Continue to progress workloads to maintain intensity without signs/symptoms of physical distress.    Average METs 2.8      Resistance Training   Training Prescription Yes    Weight 5 lb    Reps 10-15      Interval Training   Interval Training No      NuStep   Level 3    SPM 80    Minutes 15    METs 2.8      Home Exercise Plan   Plans to continue exercise at Home (comment)   walking   Frequency Add 2 additional days to program exercise sessions.    Initial Home Exercises Provided 06/08/20           Nutrition:  Target Goals: Understanding of nutrition guidelines, daily intake of sodium '1500mg'$ , cholesterol '200mg'$ , calories 30% from fat and 7% or less from  saturated fats, daily to have 5 or more servings of fruits and vegetables.  Education: Controlling Sodium/Reading Food Labels -Group verbal and written material supporting the discussion of sodium use in heart healthy nutrition. Review and explanation with models, verbal and written materials for utilization of the food label.   Education: General Nutrition Guidelines/Fats and Fiber: -Group instruction provided by verbal, written material, models and posters to present the general guidelines for heart healthy nutrition. Gives an explanation and review of dietary fats and fiber.   Pulmonary Rehab from 08/31/2020 in [facility] Cardiac and Pulmonary Rehab  Date 08/31/20  Educator [facility]  Instruction Review Code 1- Verbalizes Understanding      Biometrics:  Pre Biometrics - 05/16/20 0956      Pre Biometrics   Height 5' 3.5" (1.613 m)    Weight 225 lb 11.2 oz (102.4 kg)    BMI (Calculated) 39.35    Single Leg Stand 0 seconds           Post Biometrics - 08/24/20 7001       Post  Biometrics   Height 5' 3.5" (1.613 m)    Weight 220 lb 6.4 oz (100 kg)    BMI (Calculated) 38.43           Nutrition Therapy Plan and Nutrition Goals:  Nutrition Therapy & Goals - 06/15/20 0811      Nutrition Therapy   Diet heart healthy, low sodium    Drug/Food Interactions Coumadin/Vit K    Protein (specify units) 75-80g    Fiber 30 grams    Whole Grain Foods 3 servings    Saturated Fats 12 max. grams    Fruits and Vegetables 5 servings/day    Sodium 1.5 grams      Personal Nutrition Goals   Nutrition Goal ST: none at this time LT: increase longevity, increase functional years    Comments B: ear of corn L: [place] burger for lunch, no bread with pico de gallo. D: Chili. Pt eats lean proteins and lots of vegetables and fruit. Pt also eats bread, but feels it clogs him up so is now avoiding it. Discussed whole grains, natural laxatives like prunes and prune juice (pt rpeorts doing so), and heart healthy  eating. Pt would not like to make any new changes at this time.      Intervention Plan   Intervention Prescribe, educate and counsel regarding individualized specific dietary modifications aiming towards targeted core components  such as weight, hypertension, lipid management, diabetes, heart failure and other comorbidities.;Nutrition handout(s) given to patient.    Expected Outcomes Short Term Goal: Understand basic principles of dietary content, such as calories, fat, sodium, cholesterol and nutrients.;Long Term Goal: Adherence to prescribed nutrition plan.;Short Term Goal: A plan has been developed with personal nutrition goals set during dietitian appointment.           Nutrition Assessments:  Nutrition Assessments - 08/24/20 0823      MEDFICTS Scores   Pre Score 84    Post Score 44    Score Difference -40           MEDIFICTS Score Key:          ?70 Need to make dietary changes          40-70 Heart Healthy Diet         ? 40 Therapeutic Level Cholesterol Diet  Nutrition Goals Re-Evaluation:  Nutrition Goals Re-Evaluation    [place] Name 07/11/20 0817 08/30/20 0853           Goals   Nutrition Goal ST: choose more heart healthy snack choices LT: increase longevity, increase functional years ST: choose more heart healthy snack choices LT: increase longevity, increase functional years      Comment Pt reports eating out of boredom, discussed mindful eating and snacks he could eat that are heart healthy and lighter on calories (eating out of boredom and not hunger); veggies and dip, popcorn (kernals and spices), fruit and nuts or nut butter, edammame, etc. continue with current changes      Expected Outcome ST: choose more heart healthy snack choices LT: increase longevity, increase functional years ST: choose more heart healthy snack choices LT: increase longevity, increase functional years             Nutrition Goals Discharge (Final Nutrition Goals Re-Evaluation):  Nutrition  Goals Re-Evaluation - 08/30/20 0853      Goals   Nutrition Goal ST: choose more heart healthy snack choices LT: increase longevity, increase functional years    Comment continue with current changes    Expected Outcome ST: choose more heart healthy snack choices LT: increase longevity, increase functional years           Psychosocial: Target Goals: Acknowledge presence or absence of significant depression and/or stress, maximize coping skills, provide positive support system. Participant is able to verbalize types and ability to use techniques and skills needed for reducing stress and depression.   Education: Depression - Provides group verbal and written instruction on the correlation between heart/lung disease and depressed mood, treatment options, and the stigmas associated with seeking treatment.   Pulmonary Rehab from 08/31/2020 in [facility] Cardiac and Pulmonary Rehab  Date 07/27/20  Educator [facility]  Instruction Review Code 1- [employer] Understanding      Education: Sleep Hygiene -Provides group verbal and written instruction about how sleep can affect your health.  Define sleep hygiene, discuss sleep cycles and impact of sleep habits. Review good sleep hygiene tips.    Education: Stress and Anxiety: - Provides group verbal and written instruction about the health risks of elevated stress and causes of high stress.  Discuss the correlation between heart/lung disease and anxiety and treatment options. Review healthy ways to manage with stress and anxiety.   Initial Review & Psychosocial Screening:  Initial Psych Review & Screening - 05/12/20 0940      Initial Review   Current issues with Current Depression;Current Psychotropic Meds;Current Stress Concerns  Source of Stress Concerns Chronic Illness    Comments He does not want to divuldge about his mental health.      Family Dynamics   Good Support System? Yes    Comments He can look to his wife and sons for support.       Barriers   Psychosocial barriers to participate in program There are no identifiable barriers or psychosocial needs.;The patient should benefit from training in stress management and relaxation.;Psychosocial barriers identified (see note)      Screening Interventions   Interventions Encouraged to exercise;Program counselor consult;Provide feedback about the scores to participant;To provide support and resources with identified psychosocial needs    Expected Outcomes Short Term goal: Utilizing psychosocial counselor, staff and physician to assist with identification of specific Stressors or current issues interfering with healing process. Setting desired goal for each stressor or current issue identified.;Long Term Goal: Stressors or current issues are controlled or eliminated.;Short Term goal: Identification and review with participant of any Quality of Life or Depression concerns found by scoring the questionnaire.;Long Term goal: The participant improves quality of Life and PHQ9 Scores as seen by post scores and/or verbalization of changes           Quality of Life Scores:  Scores of 19 and below usually indicate a poorer quality of life in these areas.  A difference of  2-3 points is a clinically meaningful difference.  A difference of 2-3 points in the total score of the Quality of Life Index has been associated with significant improvement in overall quality of life, self-image, physical symptoms, and general health in studies assessing change in quality of life.  PHQ-9: Recent Review Flowsheet Data    Depression screen [facility] 2/9 08/24/2020 07/06/2020 06/10/2020 05/16/2020 06/10/2018   Decreased Interest 0 1 1 2  0   Down, Depressed, Hopeless 0 0 2 2 0   PHQ - 2 Score 0 1 3 4  0   Altered sleeping 0 0 1 3 2    Tired, decreased energy 1 2 3 2 2    Change in appetite 0 2 3 3  0   Feeling bad or failure about yourself  0 0 1 0 0   Trouble concentrating 0 0 0 3 0   Moving slowly or fidgety/restless 0 1  1 3  0   Suicidal thoughts 0 0 2 3 2     PHQ-9 Score 1 6 14 21 6    Difficult doing work/chores Somewhat difficult Not difficult at all Somewhat difficult  Very difficult Not difficult at all     Interpretation of Total Score  Total Score Depression Severity:  1-4 = Minimal depression, 5-9 = Mild depression, 10-14 = Moderate depression, 15-19 = Moderately severe depression, 20-27 = Severe depression   Psychosocial Evaluation and Intervention:  Psychosocial Evaluation - 05/12/20 0952      Psychosocial Evaluation & Interventions   Interventions Encouraged to exercise with the program and follow exercise prescription    Comments Patient would not divuldge about his mental health.    Expected Outcomes Short: Attend [facility] stress management education to decrease stress. Long: Maintain exercise Post [facility] to keep stress at a minimum.    Continue Psychosocial Services  Follow up required by staff           Psychosocial Re-Evaluation:  Psychosocial Re-Evaluation    [place] Name 06/10/20 0837 07/11/20 1133 08/22/20 0829         Psychosocial Re-Evaluation   Current issues with Current Depression;History of Depression;Current Stress Concerns  Current Stress Concerns History of Depression     Comments Reviewed patient health questionnaire (PHQ-9) with patient for follow up. Previously, patients score indicated signs/symptoms of depression.  Reviewed to see if patient is improving symptom wise while in program.  Score improved and patient states that it is because they have his meds are working and he is enjoying coming to exercise. Reviewed patient health questionnaire (PHQ-9) with patient for follow up. Previously, patients score indicated signs/symptoms of depression.  Reviewed to see if patient is improving symptom wise while in program.  Score improved and patient states that it is because he has been getting out more to rehab and improving his health. Patient reports no issues with their  current mental states, sleep, stress, depression or anxiety. Will follow up with patient in a few weeks for any changes.     Expected Outcomes Short: Continue to attend rehab and exercise for mental boost  Long; Continue to improve outlook. Short: Continue to attend [facility] regularly for regular exercise and social engagement. Long: Continue to improve symptoms and manage a positive mental state. Short: Continue to exercise regularly to support mental health and notify staff of any changes. Long: maintain mental health and well being through teaching of rehab or prescribed medications independently.     Interventions Stress management education;Relaxation education;Encouraged to attend Pulmonary Rehabilitation for the exercise Encouraged to attend Pulmonary Rehabilitation for the exercise Encouraged to attend Pulmonary Rehabilitation for the exercise     Continue Psychosocial Services  Follow up required by staff Follow up required by staff Follow up required by staff            Psychosocial Discharge (Final Psychosocial Re-Evaluation):  Psychosocial Re-Evaluation - 08/22/20 0829      Psychosocial Re-Evaluation   Current issues with History of Depression    Comments Patient reports no issues with their current mental states, sleep, stress, depression or anxiety. Will follow up with patient in a few weeks for any changes.    Expected Outcomes Short: Continue to exercise regularly to support mental health and notify staff of any changes. Long: maintain mental health and well being through teaching of rehab or prescribed medications independently.    Interventions Encouraged to attend Pulmonary Rehabilitation for the exercise    Continue Psychosocial Services  Follow up required by staff           Education: Education Goals: Education classes will be provided on a weekly basis, covering required topics. Participant will state understanding/return demonstration of topics presented.  Learning  Barriers/Preferences:  Learning Barriers/Preferences - 05/12/20 0955      Learning Barriers/Preferences   Learning Barriers None    Learning Preferences None           General Pulmonary Education Topics:  Infection Prevention: - Provides verbal and written material to individual with discussion of infection control including proper hand washing and proper equipment cleaning during exercise session.   Pulmonary Rehab from 08/31/2020 in [facility] Cardiac and Pulmonary Rehab  Date 05/12/20  Educator [facility]  Instruction Review Code 1- Verbalizes Understanding      Falls Prevention: - Provides verbal and written material to individual with discussion of falls prevention and safety.   Pulmonary Rehab from 08/31/2020 in [facility] Cardiac and Pulmonary Rehab  Date 05/12/20  Educator [facility]  Instruction Review Code 1- Verbalizes Understanding      Chronic Lung Diseases: - Group verbal and written instruction to review updates, respiratory medications, advancements in procedures and treatments. Discuss use of  supplemental oxygen including available portable oxygen systems, continuous and intermittent flow rates, concentrators, personal use and safety guidelines. Review proper use of inhaler and spacers. Provide informative websites for self-education.    Pulmonary Rehab from 08/31/2020 in [facility] Cardiac and Pulmonary Rehab  Date 07/13/20  Educator [facility]  Instruction Review Code 1- Verbalizes Understanding      Energy Conservation: - Provide group verbal and written instruction for methods to conserve energy, plan and organize activities. Instruct on pacing techniques, use of adaptive equipment and posture/positioning to relieve shortness of breath.   Triggers and Exacerbations: - Group verbal and written instruction to review types of environmental triggers and ways to prevent exacerbations. Discuss weather changes, air quality and the benefits of nasal washing. Review warning signs and symptoms to help  prevent infections. Discuss techniques for effective airway clearance, coughing, and vibrations.   AED/CPR: - Group verbal and written instruction with the use of models to demonstrate the basic use of the AED with the basic ABC's of resuscitation.   Anatomy and Physiology of the Lungs: - Group verbal and written instruction with the use of models to provide basic lung anatomy and physiology related to function, structure and complications of lung disease.   Anatomy & Physiology of the Heart: - Group verbal and written instruction and models provide basic cardiac anatomy and physiology, with the coronary electrical and arterial systems. Review of Valvular disease and Heart Failure   Cardiac Medications: - Group verbal and written instruction to review commonly prescribed medications for heart disease. Reviews the medication, class of the drug, and side effects.   Other: -Provides group and verbal instruction on various topics (see comments)   Knowledge Questionnaire Score:  Knowledge Questionnaire Score - 08/24/20 0823      Knowledge Questionnaire Score   Pre Score 10/18 Education Focus: O2 safety, Nutrition    Post Score 18/18            Core Components/Risk Factors/Patient Goals at Admission:  Personal Goals and Risk Factors at Admission - 05/16/20 1003      Core Components/Risk Factors/Patient Goals on Admission    Weight Management Yes;Weight Loss    Intervention Weight Management: Develop a combined nutrition and exercise program designed to reach desired caloric intake, while maintaining appropriate intake of nutrient and fiber, sodium and fats, and appropriate energy expenditure required for the weight goal.;Weight Management: Provide education and appropriate resources to help participant work on and attain dietary goals.;Weight Management/Obesity: Establish reasonable short term and long term weight goals.    Admit Weight 225 lb 11.2 oz (102.4 kg)    Goal Weight:  Short Term 220 lb (99.8 kg)    Goal Weight: Long Term 215 lb (97.5 kg)    Expected Outcomes Short Term: Continue to assess and modify interventions until short term weight is achieved;Long Term: Adherence to nutrition and physical activity/exercise program aimed toward attainment of established weight goal;Weight Loss: Understanding of general recommendations for a balanced deficit meal plan, which promotes 1-2 lb weight loss per week and includes a negative energy balance of [phone] kcal/d;Understanding recommendations for meals to include 15-35% energy as protein, 25-35% energy from fat, 35-60% energy from carbohydrates, less than $RemoveB'200mg'RbHmAuLq$  of dietary cholesterol, 20-35 gm of total fiber daily;Understanding of distribution of calorie intake throughout the day with the consumption of 4-5 meals/snacks    Improve shortness of breath with ADL's Yes    Intervention Provide education, individualized exercise plan and daily activity instruction to help decrease symptoms of SOB  with activities of daily living.    Expected Outcomes Short Term: Improve cardiorespiratory fitness to achieve a reduction of symptoms when performing ADLs;Long Term: Be able to perform more ADLs without symptoms or delay the onset of symptoms    Heart Failure --    Lipids Yes    Intervention Provide education and support for participant on nutrition & aerobic/resistive exercise along with prescribed medications to achieve LDL '70mg'$ , HDL >$Remo'40mg'ICLNQ$ .    Expected Outcomes Short Term: Participant states understanding of desired cholesterol values and is compliant with medications prescribed. Participant is following exercise prescription and nutrition guidelines.;Long Term: Cholesterol controlled with medications as prescribed, with individualized exercise RX and with personalized nutrition plan. Value goals: LDL < $Rem'70mg'HYMy$ , HDL > 40 mg.           Education:Diabetes - Individual verbal and written instruction to review signs/symptoms of diabetes,  desired ranges of glucose level fasting, after meals and with exercise. Acknowledge that pre and post exercise glucose checks will be done for 3 sessions at entry of program.   Education: Know Your Numbers and Risk Factors: -Group verbal and written instruction about important numbers in your health.  Discussion of what are risk factors and how they play a role in the disease process.  Review of Cholesterol, Blood Pressure, Diabetes, and BMI and the role they play in your overall health.   Pulmonary Rehab from 08/31/2020 in [facility] Cardiac and Pulmonary Rehab  Date 08/03/20  Educator [facility]  Instruction Review Code 1- Verbalizes Understanding      Core Components/Risk Factors/Patient Goals Review:   Goals and Risk Factor Review    [place] Name 06/13/20 9563 06/13/20 0833 07/13/20 0800 08/22/20 0827       Core Components/Risk Factors/Patient Goals Review   Personal Goals Review Improve shortness of breath with ADL's;Increase knowledge of respiratory medications and ability to use respiratory devices properly.;Weight Management/Obesity -- Improve shortness of breath with ADL's;Increase knowledge of respiratory medications and ability to use respiratory devices properly.;Weight Management/Obesity Weight Management/Obesity;Improve shortness of breath with ADL's;Hypertension    Review Karn Pickler says he doesnt know if he wants to continue with exercise due to it being monotonous.  He mentioned that his Dr gave him 3-5 years to live and next year will be 3.  We reviewed benefits of exercise.  Karn Pickler says he takes meds to help with family history of depression.  He reports taking all meds as directed. He sees Dr Thursday about leg pain/heaviness.  He has a couple bruises that have been there a long time. Karn Pickler sees his Dr today abour leg pain.  Today is first day lately he has worn compression socks.  He states he is taking all meds as directed.  He feels he can move easier since hes been exercising. Karn Pickler is wanting to  lose weight still and keep maintaining his ADL.Spoke to patient about their shortness of breath and what they can do to improve. Patient has been informed of breathing techniques when starting the program. Patient is informed to tell staff if they have had any med changes and that certain meds they are taking or not taking can be causing shortness of breath.    Expected Outcomes Short:  continue to attend LW Long: maintain exercise on his own -- Short: continue to tale meds and exercise Long:  maintain healthy habits Short: Attend [facility] regularly to improve shortness of breath with ADL's. Long: maintain independence with ADL's           Core  Components/Risk Factors/Patient Goals at Discharge (Final Review):   Goals and Risk Factor Review - 08/22/20 0827      Core Components/Risk Factors/Patient Goals Review   Personal Goals Review Weight Management/Obesity;Improve shortness of breath with ADL's;Hypertension    Review Karn Pickler is wanting to lose weight still and keep maintaining his ADL.Spoke to patient about their shortness of breath and what they can do to improve. Patient has been informed of breathing techniques when starting the program. Patient is informed to tell staff if they have had any med changes and that certain meds they are taking or not taking can be causing shortness of breath.    Expected Outcomes Short: Attend [facility] regularly to improve shortness of breath with ADL's. Long: maintain independence with ADL's           ITP Comments:  ITP Comments    Row Name 05/12/20 0957 05/12/20 1002 05/16/20 0949 05/20/20 0823 06/08/20 6789   ITP Comments Virtual Visit completed. Patient informed on EP and RD appointment and 6 Minute walk test. Patient also informed of patient health questionnaires on My Chart. Patient Verbalizes understanding. Visit diagnosis can be found in [facility] 04/29/2020. -- Completed 6MWT and gym orientation.  Initial ITP created and sent for review to Dr. Emily Filbert,  Medical Director. First full day of exercise!  Patient was oriented to gym and equipment including functions, settings, policies, and procedures.  Patient's individual exercise prescription and treatment plan were reviewed.  All starting workloads were established based on the results of the 6 minute walk test done at initial orientation visit.  The plan for exercise progression was also introduced and progression will be customized based on patient's performance and goals. 30 Day review completed. Medical Director ITP review done, changes made as directed, and signed approval by Medical Director.   [place] Name 07/06/20 1056 08/03/20 0631 08/17/20 1001 08/31/20 1617     ITP Comments 30 Day review completed. Medical Director ITP review done, changes made as directed, and signed approval by Medical Director. 30 Day review completed. Medical Director ITP review done, changes made as directed, and signed approval by Medical Director. Called to check on pt.  Out since 8/18. Left message 30 day review completed. ITP sent to Dr. Emily Filbert, Medical Director of Cardiac and Pulmonary Rehab. Continue with ITP unless changes are made by physician.           Comments: 30 day review

## 2020-09-02 ENCOUNTER — Other Ambulatory Visit: Payer: Self-pay

## 2020-09-02 ENCOUNTER — Encounter: Payer: Medicare Other | Admitting: *Deleted

## 2020-09-02 DIAGNOSIS — I272 Pulmonary hypertension, unspecified: Secondary | ICD-10-CM

## 2020-09-02 DIAGNOSIS — I2724 Chronic thromboembolic pulmonary hypertension: Secondary | ICD-10-CM | POA: Diagnosis not present

## 2020-09-02 NOTE — Patient Instructions (Signed)
Discharge Patient Instructions  Patient Details  Name: Adam Knight MRN: [id number] Date of Birth: 1941/09/14 Referring Provider:  French Ana, MD   Number of Visits: 36  Reason for Discharge:  Patient reached a stable level of exercise. Patient has met program and personal goals.  Smoking History:  Social History   Tobacco Use  Smoking Status Former Smoker  . Packs/day: 0.30  . Years: 7.00  . Pack years: 2.10  . Types: Cigarettes  . Quit date: 46  . Years since quitting: 54.7  Smokeless Tobacco Never Used    Diagnosis:  Pulmonary hypertension ([place])  Initial Exercise Prescription:  Initial Exercise Prescription - 05/16/20 0900      Date of Initial Exercise RX and Referring Provider   Date 05/16/20    Referring Provider [name] MD      Treadmill   MPH 1.2    Grade 0.5    Minutes 15    METs 2      Recumbant Bike   Level 1    RPM 50    Watts 11    Minutes 15    METs 2      NuStep   Level 1    SPM 80    Minutes 15    METs 2      T5 Nustep   Level 1    SPM 80    Minutes 15    METs 2      Prescription Details   Frequency (times per week) 2    Duration Progress to 30 minutes of continuous aerobic without signs/symptoms of physical distress      Intensity   THRR 40-80% of Max Heartrate 104-129    Ratings of Perceived Exertion 11-13    Perceived Dyspnea 0-4      Progression   Progression Continue to progress workloads to maintain intensity without signs/symptoms of physical distress.      Resistance Training   Training Prescription Yes    Weight 3 lb    Reps 10-15           Discharge Exercise Prescription (Final Exercise Prescription Changes):  Exercise Prescription Changes - 08/30/20 1300      Response to Exercise   Blood Pressure (Admit) 134/72    Blood Pressure (Exercise) 162/74    Blood Pressure (Exit) 120/84    Heart Rate (Admit) 89 bpm    Heart Rate (Exercise) 123 bpm    Heart Rate (Exit) 92 bpm    Oxygen  Saturation (Admit) 93 %    Oxygen Saturation (Exercise) 90 %    Oxygen Saturation (Exit) 94 %    Rating of Perceived Exertion (Exercise) 13    Perceived Dyspnea (Exercise) 3    Symptoms none    Duration Continue with 30 min of aerobic exercise without signs/symptoms of physical distress.    Intensity THRR unchanged      Progression   Progression Continue to progress workloads to maintain intensity without signs/symptoms of physical distress.    Average METs 2.8      Resistance Training   Training Prescription Yes    Weight 5 lb    Reps 10-15      Interval Training   Interval Training No      NuStep   Level 3    SPM 80    Minutes 15    METs 2.8      Home Exercise Plan   Plans to continue exercise at Home (comment)   walking  Frequency Add 2 additional days to program exercise sessions.    Initial Home Exercises Provided 06/08/20           Functional Capacity:  6 Minute Walk    Row Name 05/16/20 0950 08/24/20 0822       6 Minute Walk   Phase Initial Discharge    Distance 860 feet 1000 feet    Distance % Change -- 16.2 %    Distance Feet Change -- 140 ft    Walk Time 6 minutes 6 minutes    # of Rest Breaks 0 0    MPH 1.63 1.9    METS 1.15 1.81    RPE 13 13    Perceived Dyspnea  3 3    VO2 Peak 4.02 6.33    Symptoms Yes (comment) No    Comments SOB SOB    Resting HR 79 bpm 89 bpm    Resting BP 124/66 134/72    Resting Oxygen Saturation  95 % 93 %    Exercise Oxygen Saturation  during 6 min walk 92 % 90 %    Max Ex. HR 110 bpm 123 bpm    Max Ex. BP 136/70 162/74    2 Minute Post BP 124/68 134/72      Interval HR   1 Minute HR 98 --    2 Minute HR 100 --    3 Minute HR 108 --    4 Minute HR 107 --    5 Minute HR 109 --    6 Minute HR 110 --    2 Minute Post HR 94 --    Interval Heart Rate? Yes --      Interval Oxygen   Interval Oxygen? Yes --    Baseline Oxygen Saturation % 95 % --    1 Minute Oxygen Saturation % 93 % --    1 Minute Liters of  Oxygen 0 L  Room Air --    2 Minute Oxygen Saturation % 92 % --    2 Minute Liters of Oxygen 0 L --    3 Minute Oxygen Saturation % 93 % --    3 Minute Liters of Oxygen 0 L --    4 Minute Oxygen Saturation % 94 % --    4 Minute Liters of Oxygen 0 L --    5 Minute Oxygen Saturation % 94 % --    5 Minute Liters of Oxygen 0 L --    6 Minute Oxygen Saturation % 95 % --    6 Minute Liters of Oxygen 0 L --    2 Minute Post Oxygen Saturation % 96 % --    2 Minute Post Liters of Oxygen 0 L --           Nutrition & Weight - Outcomes:  Pre Biometrics - 05/16/20 0956      Pre Biometrics   Height 5' 3.5" (1.613 m)    Weight 225 lb 11.2 oz (102.4 kg)    BMI (Calculated) 39.35    Single Leg Stand 0 seconds           Post Biometrics - 08/24/20 0822       Post  Biometrics   Height 5' 3.5" (1.613 m)    Weight 220 lb 6.4 oz (100 kg)    BMI (Calculated) 38.43           Nutrition:  Nutrition Therapy & Goals - 06/15/20 0811      Nutrition  Therapy   Diet heart healthy, low sodium    Drug/Food Interactions Coumadin/Vit K    Protein (specify units) 75-80g    Fiber 30 grams    Whole Grain Foods 3 servings    Saturated Fats 12 max. grams    Fruits and Vegetables 5 servings/day    Sodium 1.5 grams      Personal Nutrition Goals   Nutrition Goal ST: none at this time LT: increase longevity, increase functional years    Comments B: ear of corn L: [place] burger for lunch, no bread with pico de gallo. D: Chili. Pt eats lean proteins and lots of vegetables and fruit. Pt also eats bread, but feels it clogs him up so is now avoiding it. Discussed whole grains, natural laxatives like prunes and prune juice (pt rpeorts doing so), and heart healthy eating. Pt would not like to make any new changes at this time.      Intervention Plan   Intervention Prescribe, educate and counsel regarding individualized specific dietary modifications aiming towards targeted core components such as weight,  hypertension, lipid management, diabetes, heart failure and other comorbidities.;Nutrition handout(s) given to patient.    Expected Outcomes Short Term Goal: Understand basic principles of dietary content, such as calories, fat, sodium, cholesterol and nutrients.;Long Term Goal: Adherence to prescribed nutrition plan.;Short Term Goal: A plan has been developed with personal nutrition goals set during dietitian appointment.           Nutrition Discharge:  Nutrition Assessments - 08/24/20 0823      MEDFICTS Scores   Pre Score 84    Post Score 44    Score Difference -40           Education Questionnaire Score:  Knowledge Questionnaire Score - 08/24/20 0823      Knowledge Questionnaire Score   Pre Score 10/18 Education Focus: O2 safety, Nutrition    Post Score 18/18           Goals reviewed with patient; copy given to patient.

## 2020-09-02 NOTE — Progress Notes (Signed)
Daily Session Note  Patient Details  Name: Adam Knight MRN: [id number] Date of Birth: 1941/05/08 Referring Provider:     Pulmonary Rehab from 05/16/2020 in [facility] Cardiac and Pulmonary Rehab  Referring Provider [name] MD      Encounter Date: 09/02/2020  Check In:  Session Check In - 09/02/20 0748      Check-In   Supervising physician immediately available to respond to emergencies See telemetry face sheet for immediately available ER MD    Location [facility]-Cardiac & Pulmonary Rehab    Staff Present Heath Lark, RN, BSN, CCRP;Jessica Homestead, MA, RCEP, CCRP, CCET;Joseph Monango RCP,RRT,BSRT    Virtual Visit No    Medication changes reported     No    Fall or balance concerns reported    No    Warm-up and Cool-down Performed on first and last piece of equipment    Resistance Training Performed Yes    VAD Patient? No    PAD/SET Patient? No      Pain Assessment   Currently in Pain? No/denies              Social History   Tobacco Use  Smoking Status Former Smoker  . Packs/day: 0.30  . Years: 7.00  . Pack years: 2.10  . Types: Cigarettes  . Quit date: 15  . Years since quitting: 54.7  Smokeless Tobacco Never Used    Goals Met:  Proper associated with RPD/PD & O2 Sat Independence with exercise equipment Exercise tolerated well No report of cardiac concerns or symptoms  Goals Unmet:  Not Applicable  Comments: Pt able to follow exercise prescription today without complaint.  Will continue to monitor for progression.    Dr. Emily Filbert is Medical Director for [facility] and [facility].

## 2020-09-05 ENCOUNTER — Encounter: Payer: Medicare Other | Admitting: *Deleted

## 2020-09-05 ENCOUNTER — Other Ambulatory Visit: Payer: Self-pay

## 2020-09-05 DIAGNOSIS — I2724 Chronic thromboembolic pulmonary hypertension: Secondary | ICD-10-CM | POA: Diagnosis not present

## 2020-09-05 DIAGNOSIS — I272 Pulmonary hypertension, unspecified: Secondary | ICD-10-CM

## 2020-09-05 NOTE — Progress Notes (Signed)
Pulmonary Individual Treatment Plan  Patient Details  Name: Eshan Trupiano MRN: [id number] Date of Birth: [birth year] Referring Provider:     Pulmonary Rehab from 05/16/2020 in [facility] Cardiac and Pulmonary Rehab  Referring Provider [name] MD      Initial Encounter Date:    Pulmonary Rehab from 05/16/2020 in [facility] Cardiac and Pulmonary Rehab  Date 05/16/20      Visit Diagnosis: Pulmonary hypertension ([place])  Patient's Home Medications on Admission:  Current Outpatient Medications:    B Complex-C (SUPER B COMPLEX PO), Take by mouth daily., Disp: , Rfl:    busPIRone (BUSPAR) 5 MG tablet, Take 5 mg by mouth 2 (two) times daily., Disp: , Rfl:    Calcium Carbonate Antacid (CALCIUM CARBONATE, DOSED IN MG ELEMENTAL CALCIUM,) 1250 MG/5ML SUSP, Take by mouth., Disp: , Rfl:    Cholecalciferol (VITAMIN D) 2000 UNITS tablet, Take 2,000 Units by mouth daily., Disp: , Rfl:    potassium chloride (KLOR-CON) 10 MEQ tablet, Take 40 mEq by mouth daily. , Disp: , Rfl:    torsemide (DEMADEX) 20 MG tablet, Take 2 tablets (40 mg total) by mouth daily., Disp: 180 tablet, Rfl: 1   warfarin (COUMADIN) 5 MG tablet, TAKE 1 TABLET DAILY OR TAKE AS DIRECTED BY THE ANTICOAGULATION CLINIC., Disp: 90 tablet, Rfl: 0  Past Medical History: Past Medical History:  Diagnosis Date   Actinic keratosis    Atypical mole 04/18/2020   R mid upper forehead Atypical Letiginous melanocytic proliferation with regression    Basal cell carcinoma 04/18/2020   Right mid upper forehead   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HCC)    Dyslipidemia    Edema    Gastric ulcer    OSA on CPAP    Prediabetes    Pulmonary embolism (HCC)    Seasonal allergies     Tobacco Use: Social History   Tobacco Use  Smoking Status Former Smoker   Packs/day: 0.30   Years: 7.00   Pack years: 2.10   Types: Cigarettes   Quit date: 37   Years since quitting: 54.7  Smokeless Tobacco Never Used     Labs: Recent Review Flowsheet Data   There is no flowsheet data to display.      Pulmonary Assessment Scores:  Pulmonary Assessment Scores    Row Name 05/16/20 1005 08/24/20 0823       ADL UCSD   ADL Phase Entry Exit    SOB Score total 28 10    Rest 0 0    Walk 1 0    Stairs 3 1    Bath 0 0    Dress 1 0    Shop 1 0      CAT Score   CAT Score 11 6      mMRC Score   mMRC Score 2 2           UCSD: Self-administered rating of dyspnea associated with activities of daily living (ADLs) 6-point scale (0 = "not at all" to 5 = "maximal or unable to do because of breathlessness")  Scoring Scores range from 0 to 120.  Minimally important difference is 5 units  CAT: CAT can identify the health impairment of COPD patients and is better correlated with disease progression.  CAT has a scoring range of zero to 40. The CAT score is classified into four groups of low (less than 10), medium (10 - 20), high (21-30) and very high (31-40) based on the impact level of disease on health status.  A CAT score over 10 suggests significant symptoms.  A worsening CAT score could be explained by an exacerbation, poor medication adherence, poor inhaler technique, or progression of COPD or comorbid conditions.  CAT MCID is 2 points  mMRC: mMRC (Modified Medical Research Council) Dyspnea Scale is used to assess the degree of baseline functional disability in patients of respiratory disease due to dyspnea. No minimal important difference is established. A decrease in score of 1 point or greater is considered a positive change.   Pulmonary Function Assessment:  Pulmonary Function Assessment - 05/12/20 0939      Breath   Shortness of Breath Yes;Limiting activity           Exercise Target Goals: Exercise Program Goal: Individual exercise prescription set using results from initial 6 min walk test and THRR while considering  patients activity barriers and safety.   Exercise Prescription  Goal: Initial exercise prescription builds to 30-45 minutes a day of aerobic activity, 2-3 days per week.  Home exercise guidelines will be given to patient during program as part of exercise prescription that the participant will acknowledge.  Education: Aerobic Exercise & Resistance Training: - Gives group verbal and written instruction on the various components of exercise. Focuses on aerobic and resistive training programs and the benefits of this training and how to safely progress through these programs..   Pulmonary Rehab from 08/31/2020 in [facility] Cardiac and Pulmonary Rehab  Date 08/10/20  Educator [facility]  Instruction Review Code 1- Verbalizes Understanding      Education: Exercise & Equipment Safety: - Individual verbal instruction and demonstration of equipment use and safety with use of the equipment.   Pulmonary Rehab from 08/31/2020 in [facility] Cardiac and Pulmonary Rehab  Date 05/12/20  Educator [facility]  Instruction Review Code 1- Verbalizes Understanding      Education: Exercise Physiology & General Exercise Guidelines: - Group verbal and written instruction with models to review the exercise physiology of the cardiovascular system and associated critical values. Provides general exercise guidelines with specific guidelines to those with heart or lung disease.    Pulmonary Rehab from 08/31/2020 in [facility] Cardiac and Pulmonary Rehab  Date 07/06/20  Educator [facility]  Instruction Review Code 1- Verbalizes Understanding      Education: Flexibility, Balance, Mind/Body Relaxation: Provides group verbal/written instruction on the benefits of flexibility and balance training, including mind/body exercise modes such as yoga, pilates and tai chi.  Demonstration and skill practice provided.   Activity Barriers & Risk Stratification:  Activity Barriers & Cardiac Risk Stratification - 05/16/20 0954      Activity Barriers & Cardiac Risk Stratification   Activity Barriers Deconditioning;Arthritis;Muscular  Weakness;Shortness of Breath;History of Falls;Balance Concerns;Joint Problems           6 Minute Walk:  6 Minute Walk    Row Name 05/16/20 0950 08/24/20 0822       6 Minute Walk   Phase Initial Discharge    Distance 860 feet 1000 feet    Distance % Change -- 16.2 %    Distance Feet Change -- 140 ft    Walk Time 6 minutes 6 minutes    # of Rest Breaks 0 0    MPH 1.63 1.9    METS 1.15 1.81    RPE 13 13    Perceived Dyspnea  3 3    VO2 Peak 4.02 6.33    Symptoms Yes (comment) No    Comments SOB SOB    Resting HR 79 bpm 89 bpm  Resting BP 124/66 134/72    Resting Oxygen Saturation  95 % 93 %    Exercise Oxygen Saturation  during 6 min walk 92 % 90 %    Max Ex. HR 110 bpm 123 bpm    Max Ex. BP 136/70 162/74    2 Minute Post BP 124/68 134/72      Interval HR   1 Minute HR 98 --    2 Minute HR 100 --    3 Minute HR 108 --    4 Minute HR 107 --    5 Minute HR 109 --    6 Minute HR 110 --    2 Minute Post HR 94 --    Interval Heart Rate? Yes --      Interval Oxygen   Interval Oxygen? Yes --    Baseline Oxygen Saturation % 95 % --    1 Minute Oxygen Saturation % 93 % --    1 Minute Liters of Oxygen 0 L  Room Air --    2 Minute Oxygen Saturation % 92 % --    2 Minute Liters of Oxygen 0 L --    3 Minute Oxygen Saturation % 93 % --    3 Minute Liters of Oxygen 0 L --    4 Minute Oxygen Saturation % 94 % --    4 Minute Liters of Oxygen 0 L --    5 Minute Oxygen Saturation % 94 % --    5 Minute Liters of Oxygen 0 L --    6 Minute Oxygen Saturation % 95 % --    6 Minute Liters of Oxygen 0 L --    2 Minute Post Oxygen Saturation % 96 % --    2 Minute Post Liters of Oxygen 0 L --          Oxygen Initial Assessment:  Oxygen Initial Assessment - 05/12/20 0939      Home Oxygen   Home Oxygen Device None    Sleep Oxygen Prescription CPAP    Liters per minute 0    Home Exercise Oxygen Prescription None    Home at Rest Exercise Oxygen Prescription None    Compliance  with Home Oxygen Use Yes      Initial 6 min Walk   Oxygen Used None      Program Oxygen Prescription   Program Oxygen Prescription None      Intervention   Short Term Goals To learn and exhibit compliance with exercise, home and travel O2 prescription;To learn and understand importance of monitoring SPO2 with pulse oximeter and demonstrate accurate use of the pulse oximeter.;To learn and understand importance of maintaining oxygen saturations>88%;To learn and demonstrate proper pursed lip breathing techniques or other breathing techniques.;To learn and demonstrate proper use of respiratory medications    Long  Term Goals Exhibits compliance with exercise, home and travel O2 prescription;Verbalizes importance of monitoring SPO2 with pulse oximeter and return demonstration;Maintenance of O2 saturations>88%;Exhibits proper breathing techniques, such as pursed lip breathing or other method taught during program session;Compliance with respiratory medication;Demonstrates proper use of MDIs           Oxygen Re-Evaluation:  Oxygen Re-Evaluation    Row Name 05/20/20 0824 06/13/20 0823 07/13/20 0825 08/22/20 0824       Program Oxygen Prescription   Program Oxygen Prescription None None None None      Home Oxygen   Home Oxygen Device None None None None    Sleep Oxygen Prescription  CPAP CPAP CPAP CPAP    Liters per minute 0 -- 0 0    Home Exercise Oxygen Prescription None None None None    Home at Rest Exercise Oxygen Prescription None None None None    Compliance with Home Oxygen Use Yes Yes Yes Yes      Goals/Expected Outcomes   Short Term Goals To learn and demonstrate proper pursed lip breathing techniques or other breathing techniques. To learn and demonstrate proper pursed lip breathing techniques or other breathing techniques. To learn and demonstrate proper pursed lip breathing techniques or other breathing techniques. To learn and understand importance of monitoring SPO2 with  pulse oximeter and demonstrate accurate use of the pulse oximeter.;To learn and understand importance of maintaining oxygen saturations>88%;To learn and demonstrate proper pursed lip breathing techniques or other breathing techniques.;To learn and exhibit compliance with exercise, home and travel O2 prescription    Long  Term Goals Exhibits proper breathing techniques, such as pursed lip breathing or other method taught during program session Exhibits proper breathing techniques, such as pursed lip breathing or other method taught during program session Exhibits proper breathing techniques, such as pursed lip breathing or other method taught during program session Exhibits compliance with exercise, home and travel O2 prescription;Verbalizes importance of monitoring SPO2 with pulse oximeter and return demonstration;Maintenance of O2 saturations>88%;Exhibits proper breathing techniques, such as pursed lip breathing or other method taught during program session;Compliance with respiratory medication    Comments Reviewed PLB technique with pt.  Talked about how it works and it's importance in maintaining their exercise saturations. Reviewed reasosn for PLB.  O2 has been above 88 with exercise. Mitch does use PLB once in a while as needed. He does not have a pulse oximeter to check his oxygen saturation at home. Explained why it is important to have one. Reviewed that oxygen saturations should be 88 percent and above. Patient has a pulse oximeter at home to check his oxygen.    Goals/Expected Outcomes Short: Become more profiecient at using PLB.   Long: Become independent at using PLB. Short: monitor oxygen at home and use PLB Long:  become independent at using PLB Short:  monitor oxygen at home and use PLB Long: manage shortness of breath and use PLB Short: monitor oxygen at home with exertion. Long: maintain oxygen saturations above 88 percent independently.           Oxygen Discharge (Final Oxygen  Re-Evaluation):  Oxygen Re-Evaluation - 08/22/20 0824      Program Oxygen Prescription   Program Oxygen Prescription None      Home Oxygen   Home Oxygen Device None    Sleep Oxygen Prescription CPAP    Liters per minute 0    Home Exercise Oxygen Prescription None    Home at Rest Exercise Oxygen Prescription None    Compliance with Home Oxygen Use Yes      Goals/Expected Outcomes   Short Term Goals To learn and understand importance of monitoring SPO2 with pulse oximeter and demonstrate accurate use of the pulse oximeter.;To learn and understand importance of maintaining oxygen saturations>88%;To learn and demonstrate proper pursed lip breathing techniques or other breathing techniques.;To learn and exhibit compliance with exercise, home and travel O2 prescription    Long  Term Goals Exhibits compliance with exercise, home and travel O2 prescription;Verbalizes importance of monitoring SPO2 with pulse oximeter and return demonstration;Maintenance of O2 saturations>88%;Exhibits proper breathing techniques, such as pursed lip breathing or other method taught during program session;Compliance with respiratory  medication    Comments He does not have a pulse oximeter to check his oxygen saturation at home. Explained why it is important to have one. Reviewed that oxygen saturations should be 88 percent and above. Patient has a pulse oximeter at home to check his oxygen.    Goals/Expected Outcomes Short: monitor oxygen at home with exertion. Long: maintain oxygen saturations above 88 percent independently.           Initial Exercise Prescription:  Initial Exercise Prescription - 05/16/20 0900      Date of Initial Exercise RX and Referring Provider   Date 05/16/20    Referring Provider [name] MD      Treadmill   MPH 1.2    Grade 0.5    Minutes 15    METs 2      Recumbant Bike   Level 1    RPM 50    Watts 11    Minutes 15    METs 2      NuStep   Level 1    SPM 80     Minutes 15    METs 2      T5 Nustep   Level 1    SPM 80    Minutes 15    METs 2      Prescription Details   Frequency (times per week) 2    Duration Progress to 30 minutes of continuous aerobic without signs/symptoms of physical distress      Intensity   THRR 40-80% of Max Heartrate 104-129    Ratings of Perceived Exertion 11-13    Perceived Dyspnea 0-4      Progression   Progression Continue to progress workloads to maintain intensity without signs/symptoms of physical distress.      Resistance Training   Training Prescription Yes    Weight 3 lb    Reps 10-15           Perform Capillary Blood Glucose checks as needed.  Exercise Prescription Changes:  Exercise Prescription Changes    Row Name 05/16/20 0900 05/24/20 1600 06/07/20 1500 06/21/20 1300 07/05/20 1300     Response to Exercise   Blood Pressure (Admit) 124/66 126/80 104/62 146/84 142/80   Blood Pressure (Exercise) 136/70 162/78 142/60 144/72 162/78   Blood Pressure (Exit) 124/68 126/64 118/70 124/70 130/70   Heart Rate (Admit) 79 bpm 100 bpm 82 bpm 89 bpm 84 bpm   Heart Rate (Exercise) 110 bpm 113 bpm 90 bpm 90 bpm 96 bpm   Heart Rate (Exit) 93 bpm 98 bpm 94 bpm 93 bpm 92 bpm   Oxygen Saturation (Admit) 95 % 98 % 93 % 93 % 94 %   Oxygen Saturation (Exercise) 92 % 95 % 94 % 93 % 93 %   Oxygen Saturation (Exit) 95 % 93 % 93 % 95 % 92 %   Rating of Perceived Exertion (Exercise) 13 13 12 12 13    Perceived Dyspnea (Exercise) 3 1 -- 1 1   Symptoms SOB -- -- SOB --   Comments walk test results first full day of exercise -- -- --   Duration -- Progress to 30 minutes of  aerobic without signs/symptoms of physical distress Continue with 30 min of aerobic exercise without signs/symptoms of physical distress. Continue with 30 min of aerobic exercise without signs/symptoms of physical distress. Continue with 30 min of aerobic exercise without signs/symptoms of physical distress.   Intensity -- THRR unchanged THRR  unchanged THRR unchanged THRR unchanged  Progression   Progression -- Continue to progress workloads to maintain intensity without signs/symptoms of physical distress. Continue to progress workloads to maintain intensity without signs/symptoms of physical distress. Continue to progress workloads to maintain intensity without signs/symptoms of physical distress. Continue to progress workloads to maintain intensity without signs/symptoms of physical distress.   Average METs -- 2 2.43 1.67 2     Resistance Training   Training Prescription -- Yes Yes Yes Yes   Weight -- 3 lb 4 lb 4 lb 4 lb   Reps -- 10-15 10-15 10-15 10-15     Interval Training   Interval Training -- No No No No     Treadmill   MPH -- 1.2 -- 1.4 1.4   Grade -- 0.5 -- 0.5 0.5   Minutes -- 15 -- 15 15   METs -- 2 -- 2.17 2.17     Recumbant Bike   Level -- -- 1 1 --   RPM -- -- 50 -- --   Minutes -- -- 15 15 --   METs -- -- 2.43 -- --     NuStep   Level -- -- 2 2 --   SPM -- -- 80 -- --   Minutes -- -- 15 15 --   METs -- -- -- 1.4 --     Arm Ergometer   Level -- -- -- 1 --   Minutes -- -- -- 15 --   METs -- -- -- 1.3 --     T5 Nustep   Level -- 1 -- -- 2   SPM -- -- -- -- 80   Minutes -- 15 -- -- 15   METs -- -- -- -- 1.8     Home Exercise Plan   Plans to continue exercise at -- -- -- Home (comment)  walking Home (comment)  walking   Frequency -- -- -- Add 2 additional days to program exercise sessions. Add 2 additional days to program exercise sessions.   Initial Home Exercises Provided -- -- -- 06/08/20 06/08/20   Row Name 07/19/20 1400 08/02/20 1300 08/17/20 0900 08/30/20 1300       Response to Exercise   Blood Pressure (Admit) 132/80 132/60 126/64 134/72    Blood Pressure (Exercise) 128/74 130/80 138/78 162/74    Blood Pressure (Exit) 112/62 128/70 124/64 120/84    Heart Rate (Admit) 86 bpm 89 bpm 78 bpm 89 bpm    Heart Rate (Exercise) 109 bpm 96 bpm 90 bpm 123 bpm    Heart Rate (Exit) 93 bpm  99 bpm 85 bpm 92 bpm    Oxygen Saturation (Admit) 94 % 96 % 93 % 93 %    Oxygen Saturation (Exercise) 94 % 94 % 92 % 90 %    Oxygen Saturation (Exit) 93 % 96 % 94 % 94 %    Rating of Perceived Exertion (Exercise) 13 13 14 13     Perceived Dyspnea (Exercise) 0 1 1 3     Symptoms none none none none    Duration Continue with 30 min of aerobic exercise without signs/symptoms of physical distress. Continue with 30 min of aerobic exercise without signs/symptoms of physical distress. Continue with 30 min of aerobic exercise without signs/symptoms of physical distress. Continue with 30 min of aerobic exercise without signs/symptoms of physical distress.    Intensity THRR unchanged THRR unchanged THRR unchanged THRR unchanged      Progression   Progression Continue to progress workloads to maintain intensity without signs/symptoms of physical distress.  Continue to progress workloads to maintain intensity without signs/symptoms of physical distress. Continue to progress workloads to maintain intensity without signs/symptoms of physical distress. Continue to progress workloads to maintain intensity without signs/symptoms of physical distress.    Average METs 1.99 1.8 2 2.8      Resistance Training   Training Prescription Yes Yes Yes Yes    Weight 5 lb 5 lb 5 lb 5 lb    Reps 10-15 10-15 10-15 10-15      Interval Training   Interval Training No No No No      Treadmill   MPH 1.4 -- -- --    Grade 0.5 -- -- --    Minutes 15 -- -- --    METs 2.17 -- -- --      NuStep   Level $Remo'2 2 3 3    'wKBpN$ SPM -- 80 -- 80    Minutes $Remove'15 15 15 15    'qvcBtKm$ METs 2 1.6 2 2.8      T5 Nustep   Level 2 -- -- --    Minutes 15 -- -- --    METs 1.8 -- -- --      Biostep-RELP   Level $Remo'3 3 3 'hnmLP$ --    SPM -- 50 -- --    Minutes $Remove'15 15 15 'GKXpPYc$ --    METs $Rem'2 2 2 'Ydbn$ --      Home Exercise Plan   Plans to continue exercise at Home (comment)  walking -- Home (comment)  walking Home (comment)  walking    Frequency Add 2 additional days to program  exercise sessions. -- Add 2 additional days to program exercise sessions. Add 2 additional days to program exercise sessions.    Initial Home Exercises Provided 06/08/20 -- 06/08/20 06/08/20           Exercise Comments:  Exercise Comments    Row Name 05/20/20 9458 09/05/20 0834         Exercise Comments First full day of exercise!  Patient was oriented to gym and equipment including functions, settings, policies, and procedures.  Patient's individual exercise prescription and treatment plan were reviewed.  All starting workloads were established based on the results of the 6 minute walk test done at initial orientation visit.  The plan for exercise progression was also introduced and progression will be customized based on patient's performance and goals. Maxton graduated today from  rehab with 36 sessions completed.  Details of the patient's exercise prescription and what He needs to do in order to continue the prescription and progress were discussed with patient.  Patient was given a copy of prescription and goals.  Patient verbalized understanding.  Kate plans to continue to exercise by walking at home.             Exercise Goals and Review:  Exercise Goals    Row Name 05/16/20 0956             Exercise Goals   Increase Physical Activity Yes       Intervention Provide advice, education, support and counseling about physical activity/exercise needs.;Develop an individualized exercise prescription for aerobic and resistive training based on initial evaluation findings, risk stratification, comorbidities and participant's personal goals.       Expected Outcomes Long Term: Add in home exercise to make exercise part of routine and to increase amount of physical activity.;Short Term: Attend rehab on a regular basis to increase amount of physical activity.;Long Term: Exercising regularly at least 3-5 days  a week.       Increase Strength and Stamina Yes       Intervention Provide  advice, education, support and counseling about physical activity/exercise needs.;Develop an individualized exercise prescription for aerobic and resistive training based on initial evaluation findings, risk stratification, comorbidities and participant's personal goals.       Expected Outcomes Short Term: Increase workloads from initial exercise prescription for resistance, speed, and METs.;Short Term: Perform resistance training exercises routinely during rehab and add in resistance training at home;Long Term: Improve cardiorespiratory fitness, muscular endurance and strength as measured by increased METs and functional capacity (6MWT)       Able to understand and use rate of perceived exertion (RPE) scale Yes       Intervention Provide education and explanation on how to use RPE scale       Expected Outcomes Short Term: Able to use RPE daily in rehab to express subjective intensity level;Long Term:  Able to use RPE to guide intensity level when exercising independently       Able to understand and use Dyspnea scale Yes       Intervention Provide education and explanation on how to use Dyspnea scale       Expected Outcomes Long Term: Able to use Dyspnea scale to guide intensity level when exercising independently;Short Term: Able to use Dyspnea scale daily in rehab to express subjective sense of shortness of breath during exertion       Knowledge and understanding of Target Heart Rate Range (THRR) Yes       Intervention Provide education and explanation of THRR including how the numbers were predicted and where they are located for reference       Expected Outcomes Short Term: Able to state/look up THRR;Short Term: Able to use daily as guideline for intensity in rehab;Long Term: Able to use THRR to govern intensity when exercising independently       Able to check pulse independently Yes       Intervention Provide education and demonstration on how to check pulse in carotid and radial arteries.;Review  the importance of being able to check your own pulse for safety during independent exercise       Expected Outcomes Short Term: Able to explain why pulse checking is important during independent exercise;Long Term: Able to check pulse independently and accurately       Understanding of Exercise Prescription Yes       Intervention Provide education, explanation, and written materials on patient's individual exercise prescription       Expected Outcomes Short Term: Able to explain program exercise prescription;Long Term: Able to explain home exercise prescription to exercise independently              Exercise Goals Re-Evaluation :  Exercise Goals Re-Evaluation    Row Name 05/20/20 8119 05/24/20 1559 06/07/20 1506 06/08/20 0851 06/21/20 1351     Exercise Goal Re-Evaluation   Exercise Goals Review Able to understand and use rate of perceived exertion (RPE) scale;Able to understand and use Dyspnea scale;Knowledge and understanding of Target Heart Rate Range (THRR);Understanding of Exercise Prescription Increase Physical Activity;Increase Strength and Stamina;Understanding of Exercise Prescription Increase Physical Activity;Increase Strength and Stamina;Understanding of Exercise Prescription Increase Physical Activity;Increase Strength and Stamina;Understanding of Exercise Prescription Increase Physical Activity;Increase Strength and Stamina;Understanding of Exercise Prescription   Comments Reviewed RPE and dyspnea scales, THR and program prescription with pt today.  Pt voiced understanding and was given a copy of goals to take home.  Karn Pickler has completed his first full day of exercise.  We will continue to monitor his progress. Karn Pickler has increased workloads on T4 and weights to 4 lb for strength work. Reviewed home exercise with pt today.  Pt plans to walk/consider going to [facility] for exercise.  Reviewed THR, pulse, RPE, sign and symptoms, pulse oximetery and when to call 911 or MD.  Also discussed  weather considerations and indoor options.  Pt voiced understanding. Karn Pickler has been doing well in rehab.  He is up to level 2 on the T5 Nustep.  We will continue to monitor his progres.   Expected Outcomes Short: Use RPE daily to regulate intensity. Long: Follow program prescription in THR. Short: Continue to attend rehab regularly Long: Continue follow program prescription. Short : exercise consistently Long: continue to improve MET level Short:  add in one day of exercise outside program sessions Short: Start to increase seated equipment.  Long: Continue to improve stamina.   Dalton Name 07/05/20 1356 07/13/20 0804 07/19/20 1400 08/02/20 1402 08/16/20 1358     Exercise Goal Re-Evaluation   Exercise Goals Review Increase Physical Activity;Increase Strength and Stamina;Understanding of Exercise Prescription Increase Physical Activity;Increase Strength and Stamina;Understanding of Exercise Prescription Increase Physical Activity;Increase Strength and Stamina;Understanding of Exercise Prescription Increase Physical Activity;Increase Strength and Stamina;Understanding of Exercise Prescription Increase Physical Activity;Increase Strength and Stamina;Understanding of Exercise Prescription   Comments Karn Pickler continues to tolerate exercise well.  He attended  consistently in June  but missed some sessions first part of July. Karn Pickler states he can do more at home since he started exercising.  He sees Dr today about leg pain. Karn Pickler has been doing well in rehab.  He is now up to 2 METs on both BioStep and NuStep.  He has also increased to 5 lb weights. We will continue to monitor his progress. Karn Pickler attends consistently.  Oxygen stas stay in the 90 during exercise. Staff will monitor progress. Karn Pickler is doing well in rehab.  He is nearing graduation and should improve on his post 6MWT.  We will continue to monitor his progress.   Expected Outcomes Short: get back to consistent attendance Long: increase stamina and MET level  Short: attend consistently Long: increase overall stamina Short: Continue to boost workloads long; Continue to improve stamina. Short: continue to attend consistently Long: increase MET level Short: Improve post 6MWT Long: Continue to exercise independently   Row Name 08/22/20 7262 08/30/20 1401           Exercise Goal Re-Evaluation   Exercise Goals Review Increase Physical Activity;Increase Strength and Stamina Increase Physical Activity;Increase Strength and Stamina      Comments Karn Pickler is working at at home sometimes and is able to do weights and walking. He wants to join a gym when he is done with the program. He wants to try to join [employer] that is close to him. Karn Pickler is nearing graduation.  He improved his post 6MWT by 140 feet.      Expected Outcomes Short: Graduate [facility] Program. 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 Short: complete LW Long:  maintain fitness on his own             Discharge Exercise Prescription (Final Exercise Prescription Changes):  Exercise Prescription Changes - 08/30/20 1300      Response to Exercise   Blood Pressure (Admit) 134/72    Blood Pressure (Exercise) 162/74    Blood Pressure (Exit) 120/84    Heart Rate (Admit) 89 bpm  Heart Rate (Exercise) 123 bpm    Heart Rate (Exit) 92 bpm    Oxygen Saturation (Admit) 93 %    Oxygen Saturation (Exercise) 90 %    Oxygen Saturation (Exit) 94 %    Rating of Perceived Exertion (Exercise) 13    Perceived Dyspnea (Exercise) 3    Symptoms none    Duration Continue with 30 min of aerobic exercise without signs/symptoms of physical distress.    Intensity THRR unchanged      Progression   Progression Continue to progress workloads to maintain intensity without signs/symptoms of physical distress.    Average METs 2.8      Resistance Training   Training Prescription Yes    Weight 5 lb    Reps 10-15      Interval Training   Interval Training No      NuStep   Level 3    SPM 80    Minutes 15    METs 2.8       Home Exercise Plan   Plans to continue exercise at Home (comment)   walking   Frequency Add 2 additional days to program exercise sessions.    Initial Home Exercises Provided 06/08/20           Nutrition:  Target Goals: Understanding of nutrition guidelines, daily intake of sodium '1500mg'$ , cholesterol '200mg'$ , calories 30% from fat and 7% or less from saturated fats, daily to have 5 or more servings of fruits and vegetables.  Education: Controlling Sodium/Reading Food Labels -Group verbal and written material supporting the discussion of sodium use in heart healthy nutrition. Review and explanation with models, verbal and written materials for utilization of the food label.   Education: General Nutrition Guidelines/Fats and Fiber: -Group instruction provided by verbal, written material, models and posters to present the general guidelines for heart healthy nutrition. Gives an explanation and review of dietary fats and fiber.   Pulmonary Rehab from 08/31/2020 in [facility] Cardiac and Pulmonary Rehab  Date 08/31/20  Educator [facility]  Instruction Review Code 1- Verbalizes Understanding      Biometrics:  Pre Biometrics - 05/16/20 0956      Pre Biometrics   Height 5' 3.5" (1.613 m)    Weight 225 lb 11.2 oz (102.4 kg)    BMI (Calculated) 39.35    Single Leg Stand 0 seconds           Post Biometrics - 08/24/20 2992       Post  Biometrics   Height 5' 3.5" (1.613 m)    Weight 220 lb 6.4 oz (100 kg)    BMI (Calculated) 38.43           Nutrition Therapy Plan and Nutrition Goals:  Nutrition Therapy & Goals - 06/15/20 0811      Nutrition Therapy   Diet heart healthy, low sodium    Drug/Food Interactions Coumadin/Vit K    Protein (specify units) 75-80g    Fiber 30 grams    Whole Grain Foods 3 servings    Saturated Fats 12 max. grams    Fruits and Vegetables 5 servings/day    Sodium 1.5 grams      Personal Nutrition Goals   Nutrition Goal ST: none at this time LT: increase  longevity, increase functional years    Comments B: ear of corn L: [place] burger for lunch, no bread with pico de gallo. D: Chili. Pt eats lean proteins and lots of vegetables and fruit. Pt also eats bread, but feels it clogs  him up so is now avoiding it. Discussed whole grains, natural laxatives like prunes and prune juice (pt rpeorts doing so), and heart healthy eating. Pt would not like to make any new changes at this time.      Intervention Plan   Intervention Prescribe, educate and counsel regarding individualized specific dietary modifications aiming towards targeted core components such as weight, hypertension, lipid management, diabetes, heart failure and other comorbidities.;Nutrition handout(s) given to patient.    Expected Outcomes Short Term Goal: Understand basic principles of dietary content, such as calories, fat, sodium, cholesterol and nutrients.;Long Term Goal: Adherence to prescribed nutrition plan.;Short Term Goal: A plan has been developed with personal nutrition goals set during dietitian appointment.           Nutrition Assessments:  Nutrition Assessments - 08/24/20 0823      MEDFICTS Scores   Pre Score 84    Post Score 44    Score Difference -40           MEDIFICTS Score Key:          ?70 Need to make dietary changes          40-70 Heart Healthy Diet         ? 40 Therapeutic Level Cholesterol Diet  Nutrition Goals Re-Evaluation:  Nutrition Goals Re-Evaluation    Jay Name 07/11/20 0817 08/30/20 0853           Goals   Nutrition Goal ST: choose more heart healthy snack choices LT: increase longevity, increase functional years ST: choose more heart healthy snack choices LT: increase longevity, increase functional years      Comment Pt reports eating out of boredom, discussed mindful eating and snacks he could eat that are heart healthy and lighter on calories (eating out of boredom and not hunger); veggies and dip, popcorn (kernals and spices), fruit and nuts  or nut butter, edammame, etc. continue with current changes      Expected Outcome ST: choose more heart healthy snack choices LT: increase longevity, increase functional years ST: choose more heart healthy snack choices LT: increase longevity, increase functional years             Nutrition Goals Discharge (Final Nutrition Goals Re-Evaluation):  Nutrition Goals Re-Evaluation - 08/30/20 0853      Goals   Nutrition Goal ST: choose more heart healthy snack choices LT: increase longevity, increase functional years    Comment continue with current changes    Expected Outcome ST: choose more heart healthy snack choices LT: increase longevity, increase functional years           Psychosocial: Target Goals: Acknowledge presence or absence of significant depression and/or stress, maximize coping skills, provide positive support system. Participant is able to verbalize types and ability to use techniques and skills needed for reducing stress and depression.   Education: Depression - Provides group verbal and written instruction on the correlation between heart/lung disease and depressed mood, treatment options, and the stigmas associated with seeking treatment.   Pulmonary Rehab from 08/31/2020 in [facility] Cardiac and Pulmonary Rehab  Date 07/27/20  Educator [facility]  Instruction Review Code 1- [employer] Understanding      Education: Sleep Hygiene -Provides group verbal and written instruction about how sleep can affect your health.  Define sleep hygiene, discuss sleep cycles and impact of sleep habits. Review good sleep hygiene tips.    Education: Stress and Anxiety: - Provides group verbal and written instruction about the health risks of elevated stress  and causes of high stress.  Discuss the correlation between heart/lung disease and anxiety and treatment options. Review healthy ways to manage with stress and anxiety.   Initial Review & Psychosocial Screening:  Initial Psych Review & Screening  - 05/12/20 0940      Initial Review   Current issues with Current Depression;Current Psychotropic Meds;Current Stress Concerns    Source of Stress Concerns Chronic Illness    Comments He does not want to divuldge about his mental health.      Family Dynamics   Good Support System? Yes    Comments He can look to his wife and sons for support.      Barriers   Psychosocial barriers to participate in program There are no identifiable barriers or psychosocial needs.;The patient should benefit from training in stress management and relaxation.;Psychosocial barriers identified (see note)      Screening Interventions   Interventions Encouraged to exercise;Program counselor consult;Provide feedback about the scores to participant;To provide support and resources with identified psychosocial needs    Expected Outcomes Short Term goal: Utilizing psychosocial counselor, staff and physician to assist with identification of specific Stressors or current issues interfering with healing process. Setting desired goal for each stressor or current issue identified.;Long Term Goal: Stressors or current issues are controlled or eliminated.;Short Term goal: Identification and review with participant of any Quality of Life or Depression concerns found by scoring the questionnaire.;Long Term goal: The participant improves quality of Life and PHQ9 Scores as seen by post scores and/or verbalization of changes           Quality of Life Scores:  Scores of 19 and below usually indicate a poorer quality of life in these areas.  A difference of  2-3 points is a clinically meaningful difference.  A difference of 2-3 points in the total score of the Quality of Life Index has been associated with significant improvement in overall quality of life, self-image, physical symptoms, and general health in studies assessing change in quality of life.  PHQ-9: Recent Review Flowsheet Data    Depression screen [facility] 2/9 08/24/2020  07/06/2020 06/10/2020 05/16/2020 06/10/2018   Decreased Interest 0 1 1 2  0   Down, Depressed, Hopeless 0 0 2 2 0   PHQ - 2 Score 0 1 3 4  0   Altered sleeping 0 0 1 3 2    Tired, decreased energy 1 2 3 2 2    Change in appetite 0 2 3 3  0   Feeling bad or failure about yourself  0 0 1 0 0   Trouble concentrating 0 0 0 3 0   Moving slowly or fidgety/restless 0 1 1 3  0   Suicidal thoughts 0 0 2 3 2     PHQ-9 Score 1 6 14 21 6    Difficult doing work/chores Somewhat difficult Not difficult at all Somewhat difficult  Very difficult Not difficult at all     Interpretation of Total Score  Total Score Depression Severity:  1-4 = Minimal depression, 5-9 = Mild depression, 10-14 = Moderate depression, 15-19 = Moderately severe depression, 20-27 = Severe depression   Psychosocial Evaluation and Intervention:  Psychosocial Evaluation - 05/12/20 0952      Psychosocial Evaluation & Interventions   Interventions Encouraged to exercise with the program and follow exercise prescription    Comments Patient would not divuldge about his mental health.    Expected Outcomes Short: Attend [facility] stress management education to decrease stress. Long: Maintain exercise Post [facility] to keep  stress at a minimum.    Continue Psychosocial Services  Follow up required by staff           Psychosocial Re-Evaluation:  Psychosocial Re-Evaluation    Row Name 06/10/20 0837 07/11/20 1133 08/22/20 0829         Psychosocial Re-Evaluation   Current issues with Current Depression;History of Depression;Current Stress Concerns Current Stress Concerns History of Depression     Comments Reviewed patient health questionnaire (PHQ-9) with patient for follow up. Previously, patients score indicated signs/symptoms of depression.  Reviewed to see if patient is improving symptom wise while in program.  Score improved and patient states that it is because they have his meds are working and he is enjoying coming to exercise. Reviewed  patient health questionnaire (PHQ-9) with patient for follow up. Previously, patients score indicated signs/symptoms of depression.  Reviewed to see if patient is improving symptom wise while in program.  Score improved and patient states that it is because he has been getting out more to rehab and improving his health. Patient reports no issues with their current mental states, sleep, stress, depression or anxiety. Will follow up with patient in a few weeks for any changes.     Expected Outcomes Short: Continue to attend rehab and exercise for mental boost  Long; Continue to improve outlook. Short: Continue to attend [facility] regularly for regular exercise and social engagement. Long: Continue to improve symptoms and manage a positive mental state. Short: Continue to exercise regularly to support mental health and notify staff of any changes. Long: maintain mental health and well being through teaching of rehab or prescribed medications independently.     Interventions Stress management education;Relaxation education;Encouraged to attend Pulmonary Rehabilitation for the exercise Encouraged to attend Pulmonary Rehabilitation for the exercise Encouraged to attend Pulmonary Rehabilitation for the exercise     Continue Psychosocial Services  Follow up required by staff Follow up required by staff Follow up required by staff            Psychosocial Discharge (Final Psychosocial Re-Evaluation):  Psychosocial Re-Evaluation - 08/22/20 0829      Psychosocial Re-Evaluation   Current issues with History of Depression    Comments Patient reports no issues with their current mental states, sleep, stress, depression or anxiety. Will follow up with patient in a few weeks for any changes.    Expected Outcomes Short: Continue to exercise regularly to support mental health and notify staff of any changes. Long: maintain mental health and well being through teaching of rehab or prescribed medications independently.     Interventions Encouraged to attend Pulmonary Rehabilitation for the exercise    Continue Psychosocial Services  Follow up required by staff           Education: Education Goals: Education classes will be provided on a weekly basis, covering required topics. Participant will state understanding/return demonstration of topics presented.  Learning Barriers/Preferences:  Learning Barriers/Preferences - 05/12/20 0955      Learning Barriers/Preferences   Learning Barriers None    Learning Preferences None           General Pulmonary Education Topics:  Infection Prevention: - Provides verbal and written material to individual with discussion of infection control including proper hand washing and proper equipment cleaning during exercise session.   Pulmonary Rehab from 08/31/2020 in [facility] Cardiac and Pulmonary Rehab  Date 05/12/20  Educator [facility]  Instruction Review Code 1- Verbalizes Understanding      Falls Prevention: - Provides verbal and  written material to individual with discussion of falls prevention and safety.   Pulmonary Rehab from 08/31/2020 in [facility] Cardiac and Pulmonary Rehab  Date 05/12/20  Educator [facility]  Instruction Review Code 1- Verbalizes Understanding      Chronic Lung Diseases: - Group verbal and written instruction to review updates, respiratory medications, advancements in procedures and treatments. Discuss use of supplemental oxygen including available portable oxygen systems, continuous and intermittent flow rates, concentrators, personal use and safety guidelines. Review proper use of inhaler and spacers. Provide informative websites for self-education.    Pulmonary Rehab from 08/31/2020 in [facility] Cardiac and Pulmonary Rehab  Date 07/13/20  Educator [facility]  Instruction Review Code 1- Verbalizes Understanding      Energy Conservation: - Provide group verbal and written instruction for methods to conserve energy, plan and organize activities. Instruct on pacing  techniques, use of adaptive equipment and posture/positioning to relieve shortness of breath.   Triggers and Exacerbations: - Group verbal and written instruction to review types of environmental triggers and ways to prevent exacerbations. Discuss weather changes, air quality and the benefits of nasal washing. Review warning signs and symptoms to help prevent infections. Discuss techniques for effective airway clearance, coughing, and vibrations.   AED/CPR: - Group verbal and written instruction with the use of models to demonstrate the basic use of the AED with the basic ABC's of resuscitation.   Anatomy and Physiology of the Lungs: - Group verbal and written instruction with the use of models to provide basic lung anatomy and physiology related to function, structure and complications of lung disease.   Anatomy & Physiology of the Heart: - Group verbal and written instruction and models provide basic cardiac anatomy and physiology, with the coronary electrical and arterial systems. Review of Valvular disease and Heart Failure   Cardiac Medications: - Group verbal and written instruction to review commonly prescribed medications for heart disease. Reviews the medication, class of the drug, and side effects.   Other: -Provides group and verbal instruction on various topics (see comments)   Knowledge Questionnaire Score:  Knowledge Questionnaire Score - 08/24/20 0823      Knowledge Questionnaire Score   Pre Score 10/18 Education Focus: O2 safety, Nutrition    Post Score 18/18            Core Components/Risk Factors/Patient Goals at Admission:  Personal Goals and Risk Factors at Admission - 05/16/20 1003      Core Components/Risk Factors/Patient Goals on Admission    Weight Management Yes;Weight Loss    Intervention Weight Management: Develop a combined nutrition and exercise program designed to reach desired caloric intake, while maintaining appropriate intake of nutrient  and fiber, sodium and fats, and appropriate energy expenditure required for the weight goal.;Weight Management: Provide education and appropriate resources to help participant work on and attain dietary goals.;Weight Management/Obesity: Establish reasonable short term and long term weight goals.    Admit Weight 225 lb 11.2 oz (102.4 kg)    Goal Weight: Short Term 220 lb (99.8 kg)    Goal Weight: Long Term 215 lb (97.5 kg)    Expected Outcomes Short Term: Continue to assess and modify interventions until short term weight is achieved;Long Term: Adherence to nutrition and physical activity/exercise program aimed toward attainment of established weight goal;Weight Loss: Understanding of general recommendations for a balanced deficit meal plan, which promotes 1-2 lb weight loss per week and includes a negative energy balance of [id number] kcal/d;Understanding recommendations for meals to include 15-35% energy as protein,  25-35% energy from fat, 35-60% energy from carbohydrates, less than $RemoveB'200mg'qHGpvMqj$  of dietary cholesterol, 20-35 gm of total fiber daily;Understanding of distribution of calorie intake throughout the day with the consumption of 4-5 meals/snacks    Improve shortness of breath with ADL's Yes    Intervention Provide education, individualized exercise plan and daily activity instruction to help decrease symptoms of SOB with activities of daily living.    Expected Outcomes Short Term: Improve cardiorespiratory fitness to achieve a reduction of symptoms when performing ADLs;Long Term: Be able to perform more ADLs without symptoms or delay the onset of symptoms    Heart Failure --    Lipids Yes    Intervention Provide education and support for participant on nutrition & aerobic/resistive exercise along with prescribed medications to achieve LDL '70mg'$ , HDL >$Remo'40mg'ibsEB$ .    Expected Outcomes Short Term: Participant states understanding of desired cholesterol values and is compliant with medications prescribed.  Participant is following exercise prescription and nutrition guidelines.;Long Term: Cholesterol controlled with medications as prescribed, with individualized exercise RX and with personalized nutrition plan. Value goals: LDL < $Rem'70mg'jolc$ , HDL > 40 mg.           Education:Diabetes - Individual verbal and written instruction to review signs/symptoms of diabetes, desired ranges of glucose level fasting, after meals and with exercise. Acknowledge that pre and post exercise glucose checks will be done for 3 sessions at entry of program.   Education: Know Your Numbers and Risk Factors: -Group verbal and written instruction about important numbers in your health.  Discussion of what are risk factors and how they play a role in the disease process.  Review of Cholesterol, Blood Pressure, Diabetes, and BMI and the role they play in your overall health.   Pulmonary Rehab from 08/31/2020 in [facility] Cardiac and Pulmonary Rehab  Date 08/03/20  Educator [facility]  Instruction Review Code 1- Verbalizes Understanding      Core Components/Risk Factors/Patient Goals Review:   Goals and Risk Factor Review    Row Name 06/13/20 4098 06/13/20 0833 07/13/20 0800 08/22/20 0827       Core Components/Risk Factors/Patient Goals Review   Personal Goals Review Improve shortness of breath with ADL's;Increase knowledge of respiratory medications and ability to use respiratory devices properly.;Weight Management/Obesity -- Improve shortness of breath with ADL's;Increase knowledge of respiratory medications and ability to use respiratory devices properly.;Weight Management/Obesity Weight Management/Obesity;Improve shortness of breath with ADL's;Hypertension    Review Karn Pickler says he doesnt know if he wants to continue with exercise due to it being monotonous.  He mentioned that his Dr gave him 3-5 years to live and next year will be 3.  We reviewed benefits of exercise.  Karn Pickler says he takes meds to help with family history of depression.  He  reports taking all meds as directed. He sees Dr Thursday about leg pain/heaviness.  He has a couple bruises that have been there a long time. Karn Pickler sees his Dr today abour leg pain.  Today is first day lately he has worn compression socks.  He states he is taking all meds as directed.  He feels he can move easier since hes been exercising. Karn Pickler is wanting to lose weight still and keep maintaining his ADL.Spoke to patient about their shortness of breath and what they can do to improve. Patient has been informed of breathing techniques when starting the program. Patient is informed to tell staff if they have had any med changes and that certain meds they are taking or not taking  can be causing shortness of breath.    Expected Outcomes Short:  continue to attend LW Long: maintain exercise on his own -- Short: continue to tale meds and exercise Long:  maintain healthy habits Short: Attend [facility] regularly to improve shortness of breath with ADLs. Long: maintain independence with ADLs           Core Components/Risk Factors/Patient Goals at Discharge (Final Review):   Goals and Risk Factor Review - 08/22/20 0827      Core Components/Risk Factors/Patient Goals Review   Personal Goals Review Weight Management/Obesity;Improve shortness of breath with ADL's;Hypertension    Review Karn Pickler is wanting to lose weight still and keep maintaining his ADL.Spoke to patient about their shortness of breath and what they can do to improve. Patient has been informed of breathing techniques when starting the program. Patient is informed to tell staff if they have had any med changes and that certain meds they are taking or not taking can be causing shortness of breath.    Expected Outcomes Short: Attend [facility] regularly to improve shortness of breath with ADLs. Long: maintain independence with ADLs           ITP Comments:  ITP Comments    Row Name 05/12/20 0957 05/12/20 1002 05/16/20 0949 05/20/20 0823  06/08/20 1245   ITP Comments Virtual Visit completed. Patient informed on EP and RD appointment and 6 Minute walk test. Patient also informed of patient health questionnaires on My Chart. Patient Verbalizes understanding. Visit diagnosis can be found in [facility] 04/29/2020. -- Completed 6MWT and gym orientation.  Initial ITP created and sent for review to Dr. Emily Filbert, Medical Director. First full day of exercise!  Patient was oriented to gym and equipment including functions, settings, policies, and procedures.  Patient's individual exercise prescription and treatment plan were reviewed.  All starting workloads were established based on the results of the 6 minute walk test done at initial orientation visit.  The plan for exercise progression was also introduced and progression will be customized based on patient's performance and goals. 30 Day review completed. Medical Director ITP review done, changes made as directed, and signed approval by Medical Director.   [place] Name 07/06/20 1056 08/03/20 0631 08/17/20 1001 08/31/20 1617 09/05/20 0834   ITP Comments 30 Day review completed. Medical Director ITP review done, changes made as directed, and signed approval by Medical Director. 30 Day review completed. Medical Director ITP review done, changes made as directed, and signed approval by Medical Director. Called to check on pt.  Out since 8/18. Left message 30 day review completed. ITP sent to Dr. Emily Filbert, Medical Director of Cardiac and Pulmonary Rehab. Continue with ITP unless changes are made by physician. Jayquon graduated today from  rehab with 36 sessions completed.  Details of the patient's exercise prescription and what He needs to do in order to continue the prescription and progress were discussed with patient.  Patient was given a copy of prescription and goals.  Patient verbalized understanding.  [place] plans to continue to exercise by walking at home.          Comments: Discharge ITP

## 2020-09-05 NOTE — Progress Notes (Signed)
Daily Session Note  Patient Details  Name: Adam Knight MRN: [id number] Date of Birth: 05/06/1941 Referring Provider:     Pulmonary Rehab from 05/16/2020 in [facility] Cardiac and Pulmonary Rehab  Referring Provider [name] MD      Encounter Date: 09/05/2020  Check In:  Session Check In - 09/05/20 0833      Check-In   Supervising physician immediately available to respond to emergencies See telemetry face sheet for immediately available ER MD    Location [facility]-Cardiac & Pulmonary Rehab    Staff Present Heath Lark, RN, BSN, Laveda Norman, BS, ACSM CEP, Exercise Physiologist;Joseph Tessie Fass RCP,RRT,BSRT    Virtual Visit No    Medication changes reported     No    Fall or balance concerns reported    No    Warm-up and Cool-down Performed on first and last piece of equipment    Resistance Training Performed Yes    VAD Patient? No    PAD/SET Patient? No      Pain Assessment   Currently in Pain? No/denies              Social History   Tobacco Use  Smoking Status Former Smoker  . Packs/day: 0.30  . Years: 7.00  . Pack years: 2.10  . Types: Cigarettes  . Quit date: 64  . Years since quitting: 54.7  Smokeless Tobacco Never Used    Goals Met:  Proper associated with RPD/PD & O2 Sat Independence with exercise equipment Exercise tolerated well Personal goals reviewed No report of cardiac concerns or symptoms  Goals Unmet:  Not Applicable  Comments:  [name] graduated today from  rehab with 36 sessions completed.  Details of the patient's exercise prescription and what He needs to do in order to continue the prescription and progress were discussed with patient.  Patient was given a copy of prescription and goals.  Patient verbalized understanding.  [name] plans to continue to exercise by walking at home.    Dr. Emily Filbert is Medical Director for [place] and [facility].

## 2020-09-05 NOTE — Progress Notes (Signed)
Discharge Progress Report  Patient Details  Name: Adam Knight MRN: [id number] Date of Birth: 08-29-1941 Referring Provider:     Pulmonary Rehab from 05/16/2020 in [facility] Cardiac and Pulmonary Rehab  Referring Provider [name] MD       Number of Visits: 36  Reason for Discharge:  Patient reached a stable level of exercise. Patient independent in their exercise.  Smoking History:  Social History   Tobacco Use  Smoking Status Former Smoker  . Packs/day: 0.30  . Years: 7.00  . Pack years: 2.10  . Types: Cigarettes  . Quit date: 45  . Years since quitting: 54.7  Smokeless Tobacco Never Used    Diagnosis:  Pulmonary hypertension ([place])  ADL UCSD:  Pulmonary Assessment Scores    Row Name 05/16/20 1005 08/24/20 0823       ADL UCSD   ADL Phase Entry Exit    SOB Score total 28 10    Rest 0 0    Walk 1 0    Stairs 3 1    Bath 0 0    Dress 1 0    Shop 1 0      CAT Score   CAT Score 11 6      mMRC Score   mMRC Score 2 2           Initial Exercise Prescription:  Initial Exercise Prescription - 05/16/20 0900      Date of Initial Exercise RX and Referring Provider   Date 05/16/20    Referring Provider [name] MD      Treadmill   MPH 1.2    Grade 0.5    Minutes 15    METs 2      Recumbant Bike   Level 1    RPM 50    Watts 11    Minutes 15    METs 2      NuStep   Level 1    SPM 80    Minutes 15    METs 2      T5 Nustep   Level 1    SPM 80    Minutes 15    METs 2      Prescription Details   Frequency (times per week) 2    Duration Progress to 30 minutes of continuous aerobic without signs/symptoms of physical distress      Intensity   THRR 40-80% of Max Heartrate 104-129    Ratings of Perceived Exertion 11-13    Perceived Dyspnea 0-4      Progression   Progression Continue to progress workloads to maintain intensity without signs/symptoms of physical distress.      Resistance Training   Training Prescription Yes     Weight 3 lb    Reps 10-15           Discharge Exercise Prescription (Final Exercise Prescription Changes):  Exercise Prescription Changes - 08/30/20 1300      Response to Exercise   Blood Pressure (Admit) 134/72    Blood Pressure (Exercise) 162/74    Blood Pressure (Exit) 120/84    Heart Rate (Admit) 89 bpm    Heart Rate (Exercise) 123 bpm    Heart Rate (Exit) 92 bpm    Oxygen Saturation (Admit) 93 %    Oxygen Saturation (Exercise) 90 %    Oxygen Saturation (Exit) 94 %    Rating of Perceived Exertion (Exercise) 13    Perceived Dyspnea (Exercise) 3    Symptoms none  Duration Continue with 30 min of aerobic exercise without signs/symptoms of physical distress.    Intensity THRR unchanged      Progression   Progression Continue to progress workloads to maintain intensity without signs/symptoms of physical distress.    Average METs 2.8      Resistance Training   Training Prescription Yes    Weight 5 lb    Reps 10-15      Interval Training   Interval Training No      NuStep   Level 3    SPM 80    Minutes 15    METs 2.8      Home Exercise Plan   Plans to continue exercise at Home (comment)   walking   Frequency Add 2 additional days to program exercise sessions.    Initial Home Exercises Provided 06/08/20           Functional Capacity:  6 Minute Walk    Row Name 05/16/20 0950 08/24/20 0822       6 Minute Walk   Phase Initial Discharge    Distance 860 feet 1000 feet    Distance % Change -- 16.2 %    Distance Feet Change -- 140 ft    Walk Time 6 minutes 6 minutes    # of Rest Breaks 0 0    MPH 1.63 1.9    METS 1.15 1.81    RPE 13 13    Perceived Dyspnea  3 3    VO2 Peak 4.02 6.33    Symptoms Yes (comment) No    Comments SOB SOB    Resting HR 79 bpm 89 bpm    Resting BP 124/66 134/72    Resting Oxygen Saturation  95 % 93 %    Exercise Oxygen Saturation  during 6 min walk 92 % 90 %    Max Ex. HR 110 bpm 123 bpm    Max Ex. BP 136/70 162/74    2  Minute Post BP 124/68 134/72      Interval HR   1 Minute HR 98 --    2 Minute HR 100 --    3 Minute HR 108 --    4 Minute HR 107 --    5 Minute HR 109 --    6 Minute HR 110 --    2 Minute Post HR 94 --    Interval Heart Rate? Yes --      Interval Oxygen   Interval Oxygen? Yes --    Baseline Oxygen Saturation % 95 % --    1 Minute Oxygen Saturation % 93 % --    1 Minute Liters of Oxygen 0 L  Room Air --    2 Minute Oxygen Saturation % 92 % --    2 Minute Liters of Oxygen 0 L --    3 Minute Oxygen Saturation % 93 % --    3 Minute Liters of Oxygen 0 L --    4 Minute Oxygen Saturation % 94 % --    4 Minute Liters of Oxygen 0 L --    5 Minute Oxygen Saturation % 94 % --    5 Minute Liters of Oxygen 0 L --    6 Minute Oxygen Saturation % 95 % --    6 Minute Liters of Oxygen 0 L --    2 Minute Post Oxygen Saturation % 96 % --    2 Minute Post Liters of Oxygen 0 L --  Nutrition & Weight - Outcomes:  Pre Biometrics - 05/16/20 0956      Pre Biometrics   Height 5' 3.5" (1.613 m)    Weight 225 lb 11.2 oz (102.4 kg)    BMI (Calculated) 39.35    Single Leg Stand 0 seconds           Post Biometrics - 08/24/20 2202       Post  Biometrics   Height 5' 3.5" (1.613 m)    Weight 220 lb 6.4 oz (100 kg)    BMI (Calculated) 38.43           Nutrition:  Nutrition Therapy & Goals - 06/15/20 0811      Nutrition Therapy   Diet heart healthy, low sodium    Drug/Food Interactions Coumadin/Vit K    Protein (specify units) 75-80g    Fiber 30 grams    Whole Grain Foods 3 servings    Saturated Fats 12 max. grams    Fruits and Vegetables 5 servings/day    Sodium 1.5 grams      Personal Nutrition Goals   Nutrition Goal ST: none at this time LT: increase longevity, increase functional years    Comments B: ear of corn L: [place] burger for lunch, no bread with pico de gallo. D: Chili. Pt eats lean proteins and lots of vegetables and fruit. Pt also eats bread, but feels it clogs  him up so is now avoiding it. Discussed whole grains, natural laxatives like prunes and prune juice (pt rpeorts doing so), and heart healthy eating. Pt would not like to make any new changes at this time.      Intervention Plan   Intervention Prescribe, educate and counsel regarding individualized specific dietary modifications aiming towards targeted core components such as weight, hypertension, lipid management, diabetes, heart failure and other comorbidities.;Nutrition handout(s) given to patient.    Expected Outcomes Short Term Goal: Understand basic principles of dietary content, such as calories, fat, sodium, cholesterol and nutrients.;Long Term Goal: Adherence to prescribed nutrition plan.;Short Term Goal: A plan has been developed with personal nutrition goals set during dietitian appointment.           Nutrition Discharge:  Nutrition Assessments - 08/24/20 0823      MEDFICTS Scores   Pre Score 84    Post Score 44    Score Difference -40             Goals reviewed with patient; copy given to patient.

## 2020-09-07 ENCOUNTER — Telehealth: Payer: Self-pay | Admitting: Internal Medicine

## 2020-09-07 ENCOUNTER — Other Ambulatory Visit: Payer: Self-pay

## 2020-09-07 ENCOUNTER — Ambulatory Visit (INDEPENDENT_AMBULATORY_CARE_PROVIDER_SITE_OTHER): Payer: Medicare Other

## 2020-09-07 DIAGNOSIS — Z5181 Encounter for therapeutic drug level monitoring: Secondary | ICD-10-CM | POA: Diagnosis not present

## 2020-09-07 DIAGNOSIS — I2602 Saddle embolus of pulmonary artery with acute cor pulmonale: Secondary | ICD-10-CM | POA: Diagnosis not present

## 2020-09-07 LAB — POCT INR: INR: 4.6 — AB (ref 2.0–3.0)

## 2020-09-07 NOTE — Telephone Encounter (Signed)
Spoke w/ pt.  He wanted to know if tomatoes, salsa and/or avacado would increase his INR. Advised him that the avocado has vit K in and would help bring it down, but they are not to blame for his INR of 4.6 today. He is appreciative of the call and will follow the instructions provided at his visit this am.

## 2020-09-07 NOTE — Telephone Encounter (Signed)
Patient calling to discuss issue with mandi unknown and will not disclose.  He states she knows .   p-lease call

## 2020-09-07 NOTE — Patient Instructions (Signed)
-   skip warfarin tonight & tomorrow, - only take 1 tablet on Friday, then  -on Saturday, resume your previous warfarin dosage of 1 tablet (5 mg) every day EXCEPT 1.5 TABLETS ON MONDAYS & FRIDAYS. - Recheck INR on Monday

## 2020-09-12 ENCOUNTER — Other Ambulatory Visit: Payer: Self-pay

## 2020-09-12 ENCOUNTER — Ambulatory Visit (INDEPENDENT_AMBULATORY_CARE_PROVIDER_SITE_OTHER): Payer: Medicare Other

## 2020-09-12 DIAGNOSIS — Z5181 Encounter for therapeutic drug level monitoring: Secondary | ICD-10-CM | POA: Diagnosis not present

## 2020-09-12 DIAGNOSIS — I2602 Saddle embolus of pulmonary artery with acute cor pulmonale: Secondary | ICD-10-CM | POA: Diagnosis not present

## 2020-09-12 LAB — POCT INR: INR: 1.7 — AB (ref 2.0–3.0)

## 2020-09-12 NOTE — Patient Instructions (Signed)
-   take 2 tablets tonight, then  - resume your previous warfarin dosage of 1 tablet (5 mg) every day EXCEPT 1.5 TABLETS ON MONDAYS & FRIDAYS. - Recheck INR in 3 weeks

## 2020-09-19 ENCOUNTER — Telehealth: Payer: Self-pay | Admitting: Cardiovascular Disease

## 2020-09-19 ENCOUNTER — Emergency Department: Payer: Medicare Other

## 2020-09-19 ENCOUNTER — Other Ambulatory Visit: Payer: Self-pay

## 2020-09-19 ENCOUNTER — Emergency Department
Admission: EM | Admit: 2020-09-19 | Discharge: 2020-09-19 | Disposition: A | Discharge status: Home / Self Care | Payer: Medicare Other | Attending: Emergency Medicine | Admitting: Emergency Medicine

## 2020-09-19 ENCOUNTER — Encounter: Payer: Self-pay | Admitting: Emergency Medicine

## 2020-09-19 DIAGNOSIS — Z23 Encounter for immunization: Secondary | ICD-10-CM | POA: Diagnosis not present

## 2020-09-19 DIAGNOSIS — W5501XA Bitten by cat, initial encounter: Secondary | ICD-10-CM

## 2020-09-19 DIAGNOSIS — S60221A Contusion of right hand, initial encounter: Secondary | ICD-10-CM | POA: Diagnosis not present

## 2020-09-19 DIAGNOSIS — Z79899 Other long term (current) drug therapy: Secondary | ICD-10-CM | POA: Insufficient documentation

## 2020-09-19 DIAGNOSIS — Z85828 Personal history of other malignant neoplasm of skin: Secondary | ICD-10-CM | POA: Insufficient documentation

## 2020-09-19 DIAGNOSIS — S60222A Contusion of left hand, initial encounter: Secondary | ICD-10-CM | POA: Insufficient documentation

## 2020-09-19 DIAGNOSIS — Z7901 Long term (current) use of anticoagulants: Secondary | ICD-10-CM | POA: Diagnosis not present

## 2020-09-19 DIAGNOSIS — W5503XA Scratched by cat, initial encounter: Secondary | ICD-10-CM | POA: Diagnosis not present

## 2020-09-19 DIAGNOSIS — Z87891 Personal history of nicotine dependence: Secondary | ICD-10-CM | POA: Insufficient documentation

## 2020-09-19 DIAGNOSIS — S6992XA Unspecified injury of left wrist, hand and finger(s), initial encounter: Secondary | ICD-10-CM | POA: Diagnosis present

## 2020-09-19 MED ORDER — CLINDAMYCIN HCL 300 MG PO CAPS: 300.0000 mg | ORAL_CAPSULE | Freq: Three times a day (TID) | ORAL | 0 refills | Status: AC

## 2020-09-19 MED ORDER — TETANUS-DIPHTH-ACELL PERTUSSIS 5-2.5-18.5 LF-MCG/0.5 IM SUSP
0.5000 mL | Freq: Once | INTRAMUSCULAR | Status: AC
  Administered 2020-09-19: 0.5 mL via INTRAMUSCULAR
  Filled 2020-09-19: qty 0.5

## 2020-09-19 NOTE — Discharge Instructions (Signed)
Please call your family doctor tomorrow for an appointment in 2 days to recheck your wounds.

## 2020-09-19 NOTE — ED Triage Notes (Signed)
Pt presents to ED via POV, states was scratched by his own cat yesterday while trying to put flea medicine on it yesterday. Pt presents with abrasions to bilateral hands noted posteriorly between thumb and forefinger. Bleeding controlled on arrival, new dressing applied by this RN.

## 2020-09-19 NOTE — ED Notes (Signed)
See triage note  Presents with cat scratches to both hands  States this happened last pm

## 2020-09-19 NOTE — ED Provider Notes (Signed)
[facility] Emergency Department Provider Note  ____________________________________________  Time seen: Approximately 9:49 AM  I have reviewed the triage vital signs and the nursing notes.   HISTORY  Chief Complaint Animal Scratch    HPI Adam Knight is a 79 y.o. male that presents to the emergency department for evaluation of cat scratch and bite to bilateral hands that happened yesterday.  Patient was trying to put flea treatment on his cat when it scratched both of his hands.  Cat bit his left hand.  Patient came to the emergency department this morning because he thought there may be one spot that needed a suture.  His tetanus is not up-to-date.  The cats rabies vaccine is up-to-date.  Patient is on Eliquis.  Past Medical History:  Diagnosis Date  . Actinic keratosis   . 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 Basal cell carcinoma 04/18/2020   Right mid upper forehead  . 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 Dyslipidemia   . Edema   . Gastric ulcer   . OSA on CPAP   . Prediabetes   . Pulmonary embolism ([place])   . Seasonal allergies     Patient Active Problem List   Diagnosis Date Noted  . Pain in both lower extremities 08/10/2020  . Left leg swelling 02/03/2020  . 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 Atypical chest pain 05/02/2018  . Palpitations 05/02/2018  . Long term (current) use of anticoagulants 02/18/2018  . Pulmonary embolus ([place]) 01/06/2018  . Primary osteoarthritis of both knees 11/10/2017  . Lymphedema of both lower extremities 10/25/2017  . Hyperlipidemia, mixed 06/01/2016  . Mild depression ([place]) 06/01/2016  . Dyspnea on exertion 03/01/2016  . Non morbid obesity due to excess calories 03/01/2016  . PVD (peripheral vascular disease) ([place]) 03/01/2016  . Primary osteoarthritis of left knee  04/20/2015  . Primary osteoarthritis of right knee 04/20/2015  . BMI 40.0-44.9, adult ([place]) 04/20/2015  . DOE (dyspnea on exertion) 01/26/2015  . OSA on CPAP 01/26/2015  . Morbid obesity ([place]) 01/26/2015  . Chronic edema 01/13/2015  . Hyperglycemia, unspecified 01/13/2015  . Nephrolithiasis 01/13/2015  . Obstructive sleep apnea 05/08/2013    Past Surgical History:  Procedure Laterality Date  . ENDARTERECTOMY  10/2018   Pulmonary endarterectomy for CTEPH - [facility]  . PULMONARY VENOGRAPHY Bilateral 01/08/2018   Procedure: PULMONARY VENOGRAPHY possible thrombolysis;  Surgeon: Katha Cabal, MD;  Location: [place] [facility];  Service: Cardiovascular;  Laterality: Bilateral;  . TONSILLECTOMY      Prior to Admission medications   Medication Sig Start Date End Date Taking? Authorizing Provider  B Complex-C (SUPER B COMPLEX PO) Take by mouth daily.    [provider]  busPIRone (BUSPAR) 5 MG tablet Take 5 mg by mouth 2 (two) times daily. 04/11/20   [provider]  Calcium Carbonate Antacid (CALCIUM CARBONATE, DOSED IN MG ELEMENTAL CALCIUM,) 1250 MG/5ML SUSP Take by mouth.    [provider]  Cholecalciferol (VITAMIN D) 2000 UNITS tablet Take 2,000 Units by mouth daily.    [provider]  clindamycin (CLEOCIN) 300 MG capsule Take 1 capsule (300 mg total) by mouth 3 (three) times daily for 10 days. 09/19/20 09/29/20  Laban Emperor, PA-C  potassium chloride (KLOR-CON) 10 MEQ tablet Take 40 mEq by mouth daily.  07/22/19 08/10/20  [provider]  torsemide (DEMADEX) 20 MG tablet Take 2  tablets (40 mg total) by mouth daily. 04/01/19   End, Harrell Gave, MD  warfarin (COUMADIN) 5 MG tablet TAKE 1 TABLET DAILY OR TAKE AS DIRECTED BY THE ANTICOAGULATION CLINIC. 07/11/20   End, Harrell Gave, MD    Allergies Sulfa antibiotics, Spironolactone, and Penicillins  Family History  Problem Relation Age of Onset  . Stroke Mother   . Coronary artery disease  Mother   . Osteoporosis Mother   . Rheum arthritis Mother   . Thyroid disease Mother     Social History Social History   Tobacco Use  . Smoking status: Former Smoker    Packs/day: 0.30    Years: 7.00    Pack years: 2.10    Types: Cigarettes    Quit date: 1967    Years since quitting: 54.7  . Smokeless tobacco: Never Used  Vaping Use  . Vaping Use: Never used  Substance Use Topics  . Alcohol use: Not Currently  . Drug use: No     Review of Systems  Constitutional: No fever/chills Respiratory: No SOB. Gastrointestinal: No abdominal pain.  No nausea, no vomiting.  Musculoskeletal: Positive for hand pain. Skin: Negative for rash, lacerations, ecchymosis.  Positive for scratch and puncture. Neurological: Negative for headaches   ____________________________________________   PHYSICAL EXAM:  VITAL SIGNS: ED Triage Vitals [09/19/20 0824]  Enc Vitals Group     BP 133/77     Pulse Rate 87     Resp 20     Temp 98.4 F (36.9 C)     Temp Source Oral     SpO2 95 %     Weight 222 lb (100.7 kg)     Height 5\' 2"  (1.575 m)     Head Circumference      Peak Flow      Pain Score 0     Pain Loc      Pain Edu?      Excl. in [place]?      Constitutional: Alert and oriented. Well appearing and in no acute distress. Eyes: Conjunctivae are normal. PERRL. EOMI. Head: Atraumatic. ENT:      Ears:      Nose: No congestion/rhinnorhea.      Mouth/Throat: Mucous membranes are moist.  Neck: No stridor.   Cardiovascular: Normal rate, regular rhythm.  Good peripheral circulation. Respiratory: Normal respiratory effort without tachypnea or retractions. Lungs CTAB. Good air entry to the bases with no decreased or absent breath sounds. Musculoskeletal: Full range of motion to all extremities. No gross deformities appreciated. Neurologic:  Normal speech and language. No gross focal neurologic deficits are appreciated.  Skin:  Skin is warm, dry and intact. No rash noted.   2 inch scratch  to right hand between index finger and thumb.  2 inch scratch to left hand between index finger and thumb with a puncture.  Mild bleeding from puncture wound.  Bruising to bilateral hands. Psychiatric: Mood and affect are normal. Speech and behavior are normal. Patient exhibits appropriate insight and judgement.   ____________________________________________   LABS (all labs ordered are listed, but only abnormal results are displayed)  Labs Reviewed - No data to display ____________________________________________  EKG   ____________________________________________  RADIOLOGY   DG Hand Complete Left  Result Date: 09/19/2020 CLINICAL DATA:  Cat bite. EXAM: LEFT HAND - COMPLETE 3+ VIEW COMPARISON:  No prior. FINDINGS: Diffuse degenerative change most prominent the first carpal metacarpal and first metacarpophalangeal joints. No acute abnormality identified. No radiopaque foreign bodies noted. IMPRESSION: Diffuse degenerative change, most  prominent the first carpometacarpal and first metacarpophalangeal joints. No acute abnormality. Electronically Signed   By: Marcello Moores  Register   On: 09/19/2020 10:02    ____________________________________________    PROCEDURES  Procedure(s) performed:    Procedures    Medications  Tdap (BOOSTRIX) injection 0.5 mL (0.5 mLs Intramuscular Given 09/19/20 1008)     ____________________________________________   INITIAL IMPRESSION / ASSESSMENT AND PLAN / ED COURSE  Pertinent labs & imaging results that were available during my care of the patient were reviewed by me and considered in my medical decision making (see chart for details).  Review of the [place] CSRS was performed in accordance of the [place] prior to dispensing any controlled drugs.  Patient presented the emergency department for evaluation of cat scratch.  A small amount of Dermabond was applied to the center of the right hand scratch that continues to bleed.  Surgicel was applied to  the left puncture.  Hands were wrapped.  Tetanus shot was updated.  Patient is allergic to the preferred antibiotic Augmentin.  He is also allergic to Bactrim.  Patient did have an immediate allergic reaction to penicillin and has not had a cephalosporin previously so will avoid cephalosporins currently.  Patient's INR check on Wednesday was subtherapeutic at 1.7 so also want to avoid doxycycline and ciprofloxacin, which will interact with his warfarin.  We will start clindamycin today. Patient will be discharged home with prescriptions for clindamycin. Patient is to follow up with primary care as directed. Patient is given ED precautions to return to the ED for any worsening or new symptoms.   Adam Knight was evaluated in Emergency Department on 09/19/2020 for the symptoms described in the history of present illness. He was evaluated in the context of the global COVID-19 pandemic, which necessitated consideration that the patient might be at risk for infection with the SARS-CoV-2 virus that causes COVID-19. Institutional protocols and algorithms that pertain to the evaluation of patients at risk for COVID-19 are in a state of rapid change based on information released by regulatory bodies including the CDC and federal and state organizations. These policies and algorithms were followed during the patient's care in the ED.  ____________________________________________  FINAL CLINICAL IMPRESSION(S) / ED DIAGNOSES  Final diagnoses:  Cat bite, initial encounter  Cat scratch      NEW MEDICATIONS STARTED DURING THIS VISIT:  ED Discharge Orders         Ordered    clindamycin (CLEOCIN) 300 MG capsule  3 times daily        09/19/20 1027              This chart was dictated using voice recognition software/Dragon. Despite best efforts to proofread, errors can occur which can change the meaning. Any change was purely unintentional.    Laban Emperor, PA-C 09/19/20 1514    Lucrezia Starch, MD 09/19/20 1650

## 2020-09-19 NOTE — Telephone Encounter (Signed)
Patient states he had a cat bite and would like to discuss his coumadin doseage. States he went to the ED.

## 2020-09-19 NOTE — Telephone Encounter (Signed)
Spoke w/ pt.  He reports that he attempted to put flea medication on his cat last, but the cat did not want to cooperate and scratched his hands and arms up "pretty bad".   He went to ED and was given clindamycin 300 mg TID and advised to see his PCP in 2 days to recheck.   Pt is calling to see if he should hold his warfarin tonight.   Advised him that his INR was low last week, that the clindamycin does not interfere much w/ his warfarin and that only an MD can recommend he hold his warfarin to get it subtherapeutic. Advised pt that I will work him in for INR check any time that he would like. He states that he is trying to get ahold of his PCP to make an appt and he will back if he needs an INR check after that. He is appreciative of the call.

## 2020-09-21 ENCOUNTER — Ambulatory Visit (INDEPENDENT_AMBULATORY_CARE_PROVIDER_SITE_OTHER): Payer: Medicare Other

## 2020-09-21 ENCOUNTER — Other Ambulatory Visit: Payer: Self-pay

## 2020-09-21 DIAGNOSIS — I2693 Single subsegmental pulmonary embolism without acute cor pulmonale: Secondary | ICD-10-CM | POA: Diagnosis not present

## 2020-09-21 DIAGNOSIS — Z5181 Encounter for therapeutic drug level monitoring: Secondary | ICD-10-CM

## 2020-09-21 DIAGNOSIS — I2602 Saddle embolus of pulmonary artery with acute cor pulmonale: Secondary | ICD-10-CM

## 2020-09-21 LAB — POCT INR: INR: 4.8 — AB (ref 2.0–3.0)

## 2020-09-21 NOTE — Patient Instructions (Signed)
-   skip warfarin today and tomorrow - Friday, take 1 tablet - Saturday & Sunday take 1/2 tablet -Recheck on Monday

## 2020-09-26 ENCOUNTER — Ambulatory Visit (INDEPENDENT_AMBULATORY_CARE_PROVIDER_SITE_OTHER): Payer: Medicare Other

## 2020-09-26 ENCOUNTER — Other Ambulatory Visit: Payer: Self-pay

## 2020-09-26 DIAGNOSIS — Z5181 Encounter for therapeutic drug level monitoring: Secondary | ICD-10-CM

## 2020-09-26 DIAGNOSIS — I2602 Saddle embolus of pulmonary artery with acute cor pulmonale: Secondary | ICD-10-CM | POA: Diagnosis not present

## 2020-09-26 LAB — POCT INR: INR: 1.5 — AB (ref 2.0–3.0)

## 2020-09-26 NOTE — Patient Instructions (Signed)
-   take 2 tablets today, then  - resume dosage of warfarin of 1 tablet (5 mg) every day EXCEPT 1.5 (7.5 mg) tablets on [place] as scheduled on 10/05/20

## 2020-10-05 ENCOUNTER — Ambulatory Visit (INDEPENDENT_AMBULATORY_CARE_PROVIDER_SITE_OTHER): Payer: Medicare Other

## 2020-10-05 ENCOUNTER — Other Ambulatory Visit: Payer: Self-pay | Admitting: Internal Medicine

## 2020-10-05 ENCOUNTER — Other Ambulatory Visit: Payer: Self-pay

## 2020-10-05 DIAGNOSIS — Z5181 Encounter for therapeutic drug level monitoring: Secondary | ICD-10-CM | POA: Diagnosis not present

## 2020-10-05 DIAGNOSIS — I2602 Saddle embolus of pulmonary artery with acute cor pulmonale: Secondary | ICD-10-CM | POA: Diagnosis not present

## 2020-10-05 LAB — POCT INR: INR: 2.7 (ref 2.0–3.0)

## 2020-10-05 NOTE — Patient Instructions (Signed)
-   continue dosage of warfarin of 1 tablet (5 mg) every day EXCEPT 1.5 (7.5 mg) tablets on MONDAYS & FRIDAYS - Recheck INR in 3 weeks.

## 2020-10-05 NOTE — Telephone Encounter (Signed)
Refill Request.  

## 2020-10-26 ENCOUNTER — Ambulatory Visit (INDEPENDENT_AMBULATORY_CARE_PROVIDER_SITE_OTHER): Payer: Medicare Other

## 2020-10-26 ENCOUNTER — Other Ambulatory Visit: Payer: Self-pay

## 2020-10-26 DIAGNOSIS — I2602 Saddle embolus of pulmonary artery with acute cor pulmonale: Secondary | ICD-10-CM | POA: Diagnosis not present

## 2020-10-26 DIAGNOSIS — Z5181 Encounter for therapeutic drug level monitoring: Secondary | ICD-10-CM | POA: Diagnosis not present

## 2020-10-26 LAB — POCT INR: INR: 4.3 — AB (ref 2.0–3.0)

## 2020-10-26 NOTE — Patient Instructions (Signed)
-   skip warfarin tonight & tomorrow, then  - continue dosage of warfarin of 1 tablet (5 mg) every day EXCEPT 1.5 (7.5 mg) tablets on MONDAYS & FRIDAYS - Recheck INR in 2 weeks.

## 2020-11-09 ENCOUNTER — Other Ambulatory Visit: Payer: Self-pay

## 2020-11-09 ENCOUNTER — Telehealth: Payer: Self-pay | Admitting: Internal Medicine

## 2020-11-09 ENCOUNTER — Ambulatory Visit (INDEPENDENT_AMBULATORY_CARE_PROVIDER_SITE_OTHER): Payer: Medicare Other

## 2020-11-09 DIAGNOSIS — I2602 Saddle embolus of pulmonary artery with acute cor pulmonale: Secondary | ICD-10-CM

## 2020-11-09 DIAGNOSIS — Z5181 Encounter for therapeutic drug level monitoring: Secondary | ICD-10-CM | POA: Diagnosis not present

## 2020-11-09 LAB — POCT INR: INR: 3.4 — AB (ref 2.0–3.0)

## 2020-11-09 NOTE — Telephone Encounter (Signed)
   [facility] Pre-operative Risk Assessment    HEARTCARE STAFF: - Please ensure there is not already an duplicate clearance open for this procedure. - Under Visit Info/Reason for Call, type in Other and utilize the format Clearance MM/DD/YY or Clearance TBD. Do not use dashes or single digits. - If request is for dental extraction, please clarify the # of teeth to be extracted.  Request for surgical clearance:  1. What type of surgery is being performed? Tooth extraction (1)  2. When is this surgery scheduled? 11/10/2020  3. What type of clearance is required (medical clearance vs. Pharmacy clearance to hold med vs. Both)? pharm  4. Are there any medications that need to be held prior to surgery and how long? None noted  5. Practice name and name of physician performing surgery? [facility]. Lacinda Axon  6. What is the office phone number? [phone]   7.   What is the office fax number? [phone]  8.   Anesthesia type (None, local, MAC, general) ? local   Adam Knight 11/09/2020, 2:45 PM  _________________________________________________________________   (provider comments below)

## 2020-11-09 NOTE — Telephone Encounter (Signed)
Does pt needs to hold anticoagulation?

## 2020-11-09 NOTE — Telephone Encounter (Signed)
No need to hold anti coagulation.

## 2020-11-09 NOTE — Patient Instructions (Signed)
-   TRY TO HAVE SOME [place] TODAY, then  - START NEW DOSAGE of warfarin of 1 tablet (5 mg) every day.  - Recheck INR in 2 weeks.

## 2020-11-23 ENCOUNTER — Other Ambulatory Visit: Payer: Self-pay

## 2020-11-23 ENCOUNTER — Ambulatory Visit (INDEPENDENT_AMBULATORY_CARE_PROVIDER_SITE_OTHER): Payer: Medicare Other

## 2020-11-23 DIAGNOSIS — I2602 Saddle embolus of pulmonary artery with acute cor pulmonale: Secondary | ICD-10-CM

## 2020-11-23 DIAGNOSIS — Z5181 Encounter for therapeutic drug level monitoring: Secondary | ICD-10-CM

## 2020-11-23 LAB — POCT INR: INR: 2.3 (ref 2.0–3.0)

## 2020-11-23 NOTE — Patient Instructions (Signed)
-   continue of warfarin of 1 tablet (5 mg) every day.  - Recheck INR in 3 weeks.

## 2020-11-28 ENCOUNTER — Other Ambulatory Visit: Payer: Self-pay

## 2020-11-28 ENCOUNTER — Encounter: Payer: Self-pay | Admitting: Dermatology

## 2020-11-28 ENCOUNTER — Ambulatory Visit (INDEPENDENT_AMBULATORY_CARE_PROVIDER_SITE_OTHER): Payer: Medicare Other | Admitting: Dermatology

## 2020-11-28 DIAGNOSIS — Z85828 Personal history of other malignant neoplasm of skin: Secondary | ICD-10-CM | POA: Diagnosis not present

## 2020-11-28 DIAGNOSIS — D2239 Melanocytic nevi of other parts of face: Secondary | ICD-10-CM | POA: Diagnosis not present

## 2020-11-28 DIAGNOSIS — L820 Inflamed seborrheic keratosis: Secondary | ICD-10-CM | POA: Diagnosis not present

## 2020-11-28 DIAGNOSIS — L578 Other skin changes due to chronic exposure to nonionizing radiation: Secondary | ICD-10-CM | POA: Diagnosis not present

## 2020-11-28 DIAGNOSIS — L570 Actinic keratosis: Secondary | ICD-10-CM | POA: Diagnosis not present

## 2020-11-28 DIAGNOSIS — D229 Melanocytic nevi, unspecified: Secondary | ICD-10-CM

## 2020-11-28 NOTE — Progress Notes (Signed)
   Follow-Up Visit   Subjective  Adam Knight is a 79 y.o. male who presents for the following: Actinic Keratosis (20m f/u, PDT x 2 face), Hx of BCC (R mid upper forehead), and Atypical Nevus (R temple).  He has a couple new scaly spots on his L wrist/hand.  Not bothersome.   The following portions of the chart were reviewed this encounter and updated as appropriate:      Review of Systems:  No other skin or systemic complaints except as noted in HPI or Assessment and Plan.  Objective  Well appearing patient in no apparent distress; mood and affect are within normal limits.  A focused examination was performed including face. Relevant physical exam findings are noted in the Assessment and Plan.  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  Was not treated by Mohs.  Will monitor area.  Observe for recurrence  AK (actinic keratosis) (2) R upper forehead x 1, R upper eyebrow x 1  Good results from PDT with ALA  Destruction of lesion - R upper forehead x 1, R upper eyebrow x 1  Destruction method: cryotherapy   Informed consent: discussed and consent obtained   Lesion destroyed using liquid nitrogen: Yes   Region frozen until ice ball extended beyond lesion: Yes   Outcome: patient tolerated procedure well with no complications   Post-procedure details: wound care instructions given    History of basal cell carcinoma (BCC) R mid upper forehead  Clear with MOHs     Actinic Damage - chronic, secondary to cumulative UV radiation exposure/sun exposure over time - diffuse scaly erythematous macules with underlying dyspigmentation - Recommend daily broad spectrum sunscreen SPF 30+ to sun-exposed areas, reapply every 2 hours as needed.  - Call for new or changing lesions.  Return in about 6 months (around 05/29/2021) for UBSE.   I, Othelia Pulling, RMA, am acting as scribe for Brendolyn Patty, MD .  Documentation: I have reviewed the above documentation for accuracy and completeness, and I agree with the above.  Brendolyn Patty MD

## 2020-11-29 ENCOUNTER — Ambulatory Visit (INDEPENDENT_AMBULATORY_CARE_PROVIDER_SITE_OTHER): Payer: Medicare Other | Admitting: Nurse Practitioner

## 2020-11-29 ENCOUNTER — Encounter (INDEPENDENT_AMBULATORY_CARE_PROVIDER_SITE_OTHER): Payer: Self-pay | Admitting: Nurse Practitioner

## 2020-11-29 VITALS — BP 136/82 | HR 98 | Resp 16 | Wt 226.0 lb

## 2020-11-29 DIAGNOSIS — I872 Venous insufficiency (chronic) (peripheral): Secondary | ICD-10-CM

## 2020-11-29 DIAGNOSIS — I890 Lymphedema, not elsewhere classified: Secondary | ICD-10-CM

## 2020-11-29 DIAGNOSIS — E782 Mixed hyperlipidemia: Secondary | ICD-10-CM

## 2020-12-04 ENCOUNTER — Encounter (INDEPENDENT_AMBULATORY_CARE_PROVIDER_SITE_OTHER): Payer: Self-pay | Admitting: Nurse Practitioner

## 2020-12-04 NOTE — Progress Notes (Signed)
Subjective:    Patient ID: Adam Knight, male    DOB: Dec 01, 1941, 79 y.o.   MRN: [id number] Chief Complaint  Patient presents with  . Follow-up    est ptr for PVD edema    Patient is seen for evaluation of leg swelling. The patient first noticed the swelling remotely but is now concerned because of a significant increase in the overall edema. The swelling is associated with pain and discoloration. The patient notes that in the morning the legs are significantly improved but they steadily worsened throughout the course of the day. Elevation makes the legs better, dependency makes them much worse.   There is no history of ulcerations associated with the swelling.   The patient denies any recent changes in their medications.  The patient has not been wearing graduated compression.  The patient has no had any past angiography, interventions or vascular surgery.  The patient denies a history of DVT or PE. There is no prior history of phlebitis. There is no history of primary lymphedema.  There is no history of radiation treatment to the groin or pelvis No history of malignancies. No history of trauma or groin or pelvic surgery. No history of foreign travel or parasitic infections area    Review of Systems  Cardiovascular: Positive for leg swelling.  All other systems reviewed and are negative.      Objective:   Physical Exam Vitals reviewed.  HENT:     Head: Normocephalic.  Cardiovascular:     Rate and Rhythm: Normal rate.     Pulses: Normal pulses.  Pulmonary:     Effort: Pulmonary effort is normal.  Skin:    Findings: Lesion (see picture) present.  Neurological:     Mental Status: He is alert and oriented to person, place, and time.  Psychiatric:        Mood and Affect: Mood normal.        Behavior: Behavior normal.        Thought Content: Thought content normal.        Judgment: Judgment normal.          BP 136/82 (BP Location: Left Arm)   Pulse 98    Resp 16   Wt 226 lb (102.5 kg)   BMI 41.34 kg/m   Past Medical History:  Diagnosis Date  . Actinic keratosis   . Atypical mole 04/18/2020   R temple Atypical Letiginous melanocytic proliferation with regression, clear post biopsy  . Basal cell carcinoma 04/18/2020   Right mid upper forehead, [facility] 06/22/20  . 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 Dyslipidemia   . Edema   . Gastric ulcer   . OSA on CPAP   . Prediabetes   . Pulmonary embolism ([place])   . Seasonal allergies     Social History   Socioeconomic History  . Marital status: Married    Spouse name: Not on file  . Number of children: Not on file  . Years of education: Not on file  . Highest education level: Not on file  Occupational History  . Not on file  Tobacco Use  . Smoking status: Former Smoker    Packs/day: 0.30    Years: 7.00    Pack years: 2.10    Types: Cigarettes    Quit date: 1967    Years since quitting: 54.9  . Smokeless tobacco: Never Used  Vaping Use  . Vaping Use: Never used  Substance and Sexual Activity  . Alcohol  use: Not Currently  . Drug use: No  . Sexual activity: Not on file  Other Topics Concern  . Not on file  Social History Narrative  . Not on file   Social Determinants of Health   Financial Resource Strain: Not on file  Food Insecurity: Not on file  Transportation Needs: Not on file  Physical Activity: Not on file  Stress: Not on file  Social Connections: Not on file  Intimate Partner Violence: Not on file    Past Surgical History:  Procedure Laterality Date  . ENDARTERECTOMY  10/2018   Pulmonary endarterectomy for CTEPH - [facility]  . PULMONARY VENOGRAPHY Bilateral 01/08/2018   Procedure: PULMONARY VENOGRAPHY possible thrombolysis;  Surgeon: Katha Cabal, MD;  Location: [place] [facility];  Service: Cardiovascular;  Laterality: Bilateral;  . TONSILLECTOMY      Family History  Problem Relation Age of Onset  . Stroke Mother   .  Coronary artery disease Mother   . Osteoporosis Mother   . Rheum arthritis Mother   . Thyroid disease Mother     Allergies  Allergen Reactions  . Sulfa Antibiotics Itching and Swelling  . Spironolactone     Headache  . Penicillins Itching    Has patient had a PCN reaction causing immediate rash, facial/tongue/throat swelling, SOB or lightheadedness with hypotension: Yes Has patient had a PCN reaction causing severe rash involving mucus membranes or skin necrosis: No Has patient had a PCN reaction that required hospitalization: No Has patient had a PCN reaction occurring within the last 10 years: No If all of the above answers are "NO", then may proceed with Cephalosporin use.    CBC Latest Ref Rng & Units 03/28/2020 03/09/2019 01/09/2018  WBC 4.0 - 10.5 K/uL 7.8 6.1 5.5  Hemoglobin 13.0 - 17.0 g/dL 14.8 13.8 15.1  Hematocrit 39.0 - 52.0 % 44.2 42.3 44.9  Platelets 150 - 400 K/uL 411(H) 387 317      CMP     Component Value Date/Time   NA 137 03/28/2020 1034   K 3.1 (L) 03/28/2020 1034   CL 99 03/28/2020 1034   CO2 26 03/28/2020 1034   GLUCOSE 115 (H) 03/28/2020 1034   BUN 29 (H) 03/28/2020 1034   CREATININE 1.36 (H) 03/28/2020 1034   CALCIUM 8.5 (L) 03/28/2020 1034   PROT 7.2 03/09/2019 1615   ALBUMIN 3.7 03/09/2019 1615   AST 35 03/09/2019 1615   ALT 24 03/09/2019 1615   ALKPHOS 57 03/09/2019 1615   BILITOT 0.4 03/09/2019 1615   GFRNONAA 49 (L) 03/28/2020 1034   GFRAA 57 (L) 03/28/2020 1034     No results found.     Assessment & Plan:   1. Hyperlipidemia, mixed Continue statin as ordered and reviewed, no changes at this time   2. Lymphedema of both lower extremities I have had a long discussion with the patient regarding swelling and why it  causes symptoms.  Patient will begin wearing graduated compression stockings class 1 (20-30 mmHg) on a daily basis a prescription was given. The patient will  beginning wearing the stockings first thing in the morning and  removing them in the evening. The patient is instructed specifically not to sleep in the stockings.   In addition, behavioral modification will be initiated.  This will include frequent elevation, use of over the counter pain medications and exercise such as walking.  I have reviewed systemic causes for chronic edema such as liver, kidney and cardiac etiologies.  The patient denies problems  with these organ systems.    Consideration for a lymph pump will also be made based upon the effectiveness of conservative therapy.  This would help to improve the edema control and prevent sequela such as ulcers and infections   Patient should undergo duplex ultrasound of the venous system to ensure that DVT or reflux is not present.  The patient will follow-up with me after the ultrasound.    3. Venous stasis dermatitis of left lower extremity  The discoloration of the patient's ankles likely related to stasis dermatitis however we will consult with his dermatologist as well simply because the touch is not consistent with the feeling of a partial stasis dermatitis. This feels slightly raised. We will gladly welcome dermatology's input.  Current Outpatient Medications on File Prior to Visit  Medication Sig Dispense Refill  . B Complex-C (SUPER B COMPLEX PO) Take by mouth daily.    . busPIRone (BUSPAR) 5 MG tablet Take 5 mg by mouth 2 (two) times daily.    . Calcium Carbonate Antacid (CALCIUM CARBONATE, DOSED IN MG ELEMENTAL CALCIUM,) 1250 MG/5ML SUSP Take by mouth.    . Cholecalciferol (VITAMIN D) 2000 UNITS tablet Take 2,000 Units by mouth daily.    Marland Kitchen torsemide (DEMADEX) 20 MG tablet Take 2 tablets (40 mg total) by mouth daily. 180 tablet 1  . warfarin (COUMADIN) 5 MG tablet TAKE 1 TABLET DAILY OR TAKE AS DIRECTED BY THE ANTICOAGULATION CLINIC. 90 tablet 1  . potassium chloride (KLOR-CON) 10 MEQ tablet Take 40 mEq by mouth daily.      No current facility-administered medications on file prior to  visit.    There are no Patient Instructions on file for this visit. No follow-ups on file.   Kris Hartmann, NP

## 2020-12-14 ENCOUNTER — Ambulatory Visit (INDEPENDENT_AMBULATORY_CARE_PROVIDER_SITE_OTHER): Payer: Medicare Other

## 2020-12-14 ENCOUNTER — Other Ambulatory Visit: Payer: Self-pay

## 2020-12-14 DIAGNOSIS — Z5181 Encounter for therapeutic drug level monitoring: Secondary | ICD-10-CM

## 2020-12-14 DIAGNOSIS — I2602 Saddle embolus of pulmonary artery with acute cor pulmonale: Secondary | ICD-10-CM

## 2020-12-14 LAB — POCT INR: INR: 3.3 — AB (ref 2.0–3.0)

## 2020-12-14 NOTE — Patient Instructions (Signed)
-   skip warfarin tonight, then - continue of warfarin of 1 tablet (5 mg) every day.  - Recheck INR in 3 weeks.

## 2020-12-15 ENCOUNTER — Other Ambulatory Visit (INDEPENDENT_AMBULATORY_CARE_PROVIDER_SITE_OTHER): Payer: Self-pay | Admitting: Nurse Practitioner

## 2020-12-15 DIAGNOSIS — I890 Lymphedema, not elsewhere classified: Secondary | ICD-10-CM

## 2020-12-25 IMAGING — DX DG HAND COMPLETE 3+V*L*
3 series · 3 of 3 positions shown · non-contrast
Comparison: No prior.

CLINICAL DATA: Cat bite.

EXAM:
LEFT HAND - COMPLETE 3+ VIEW

[hand ap]
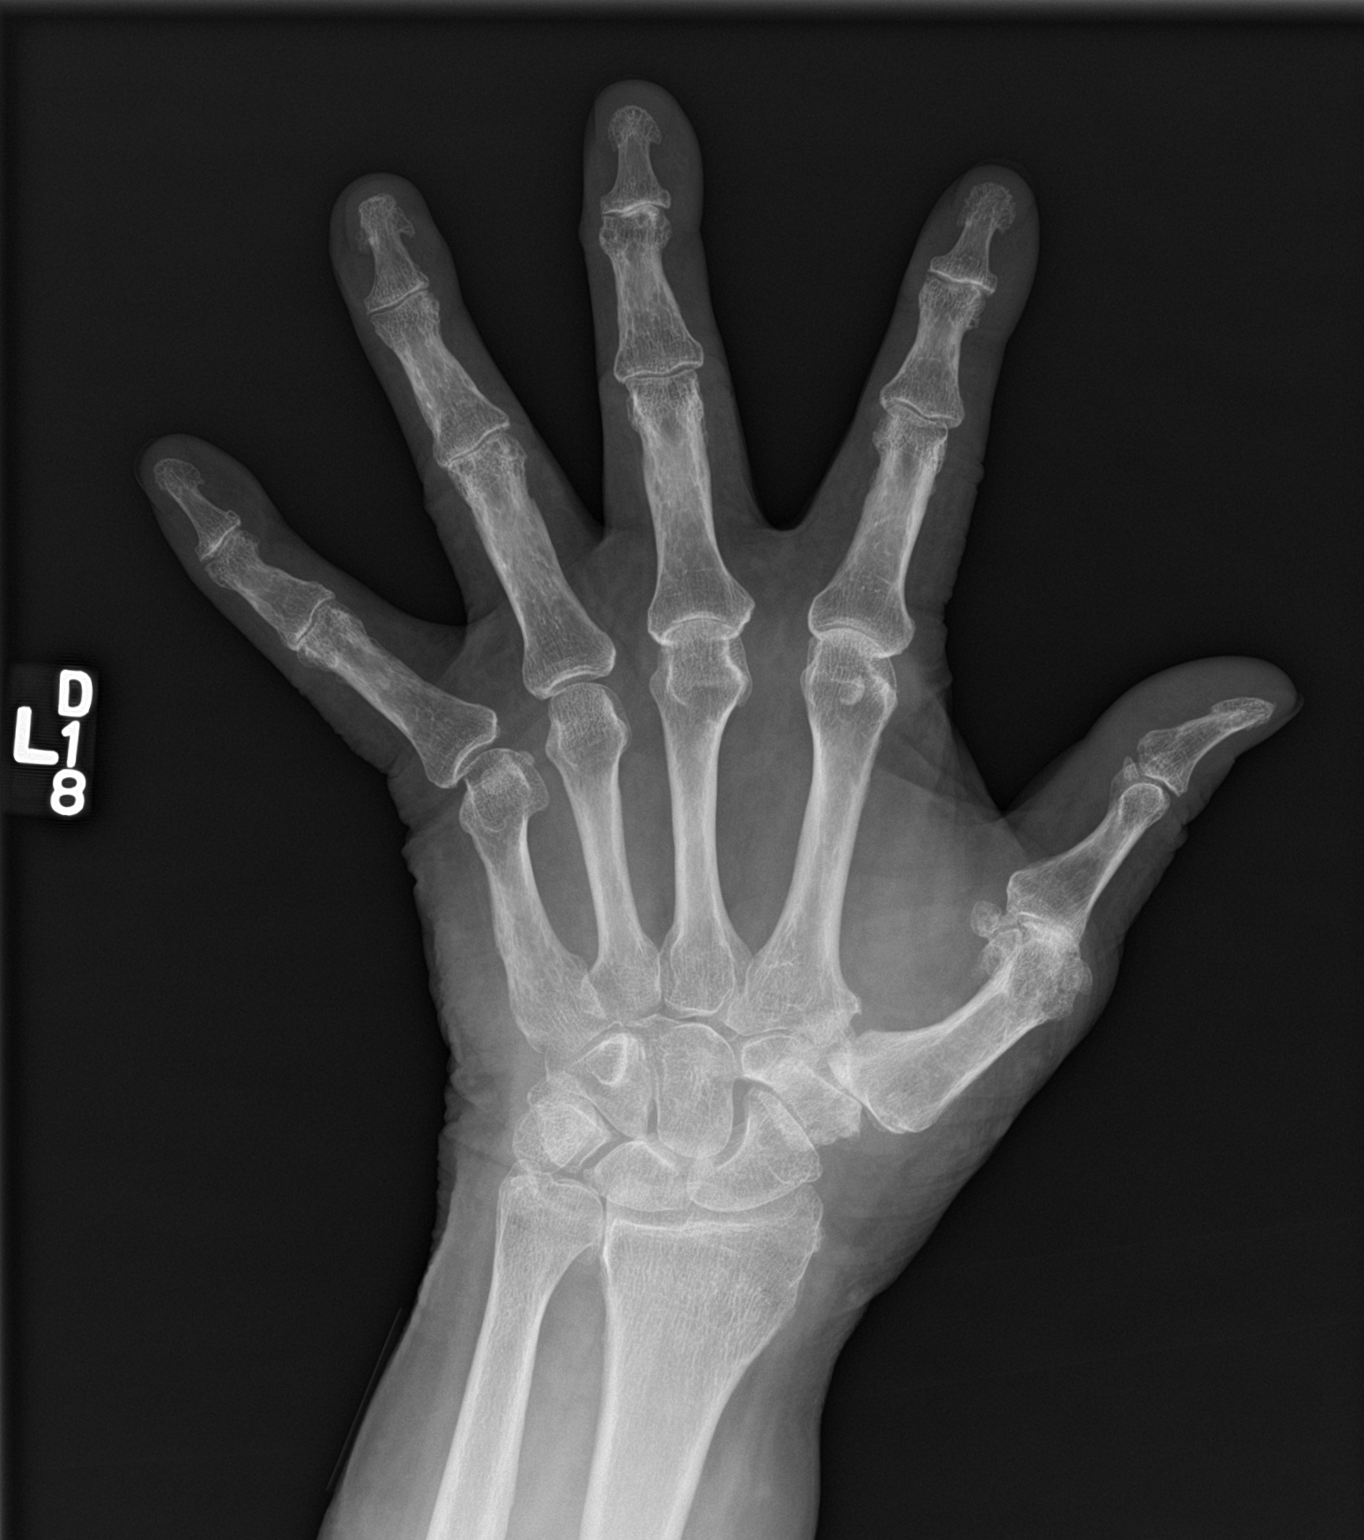

[hand obl]
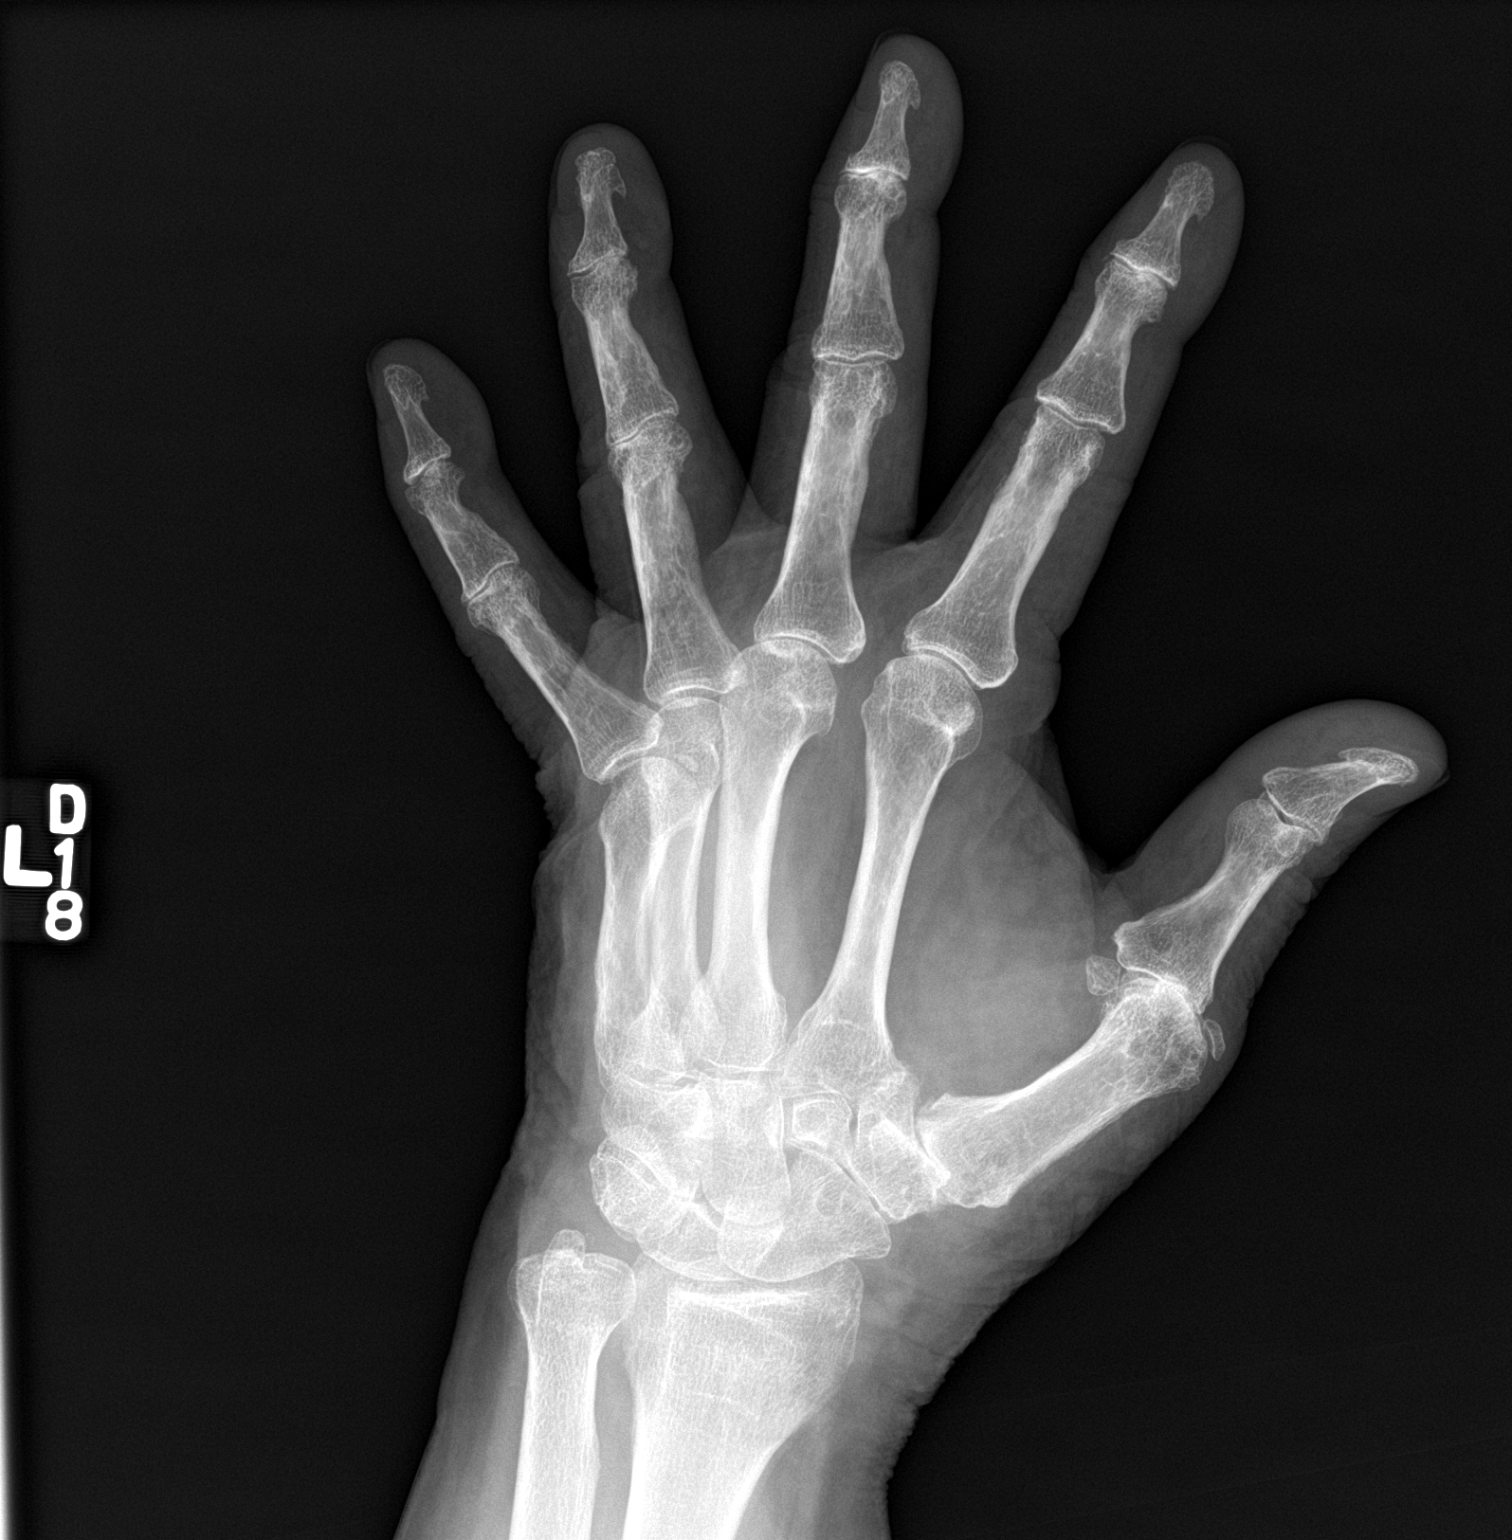

[hand lat]
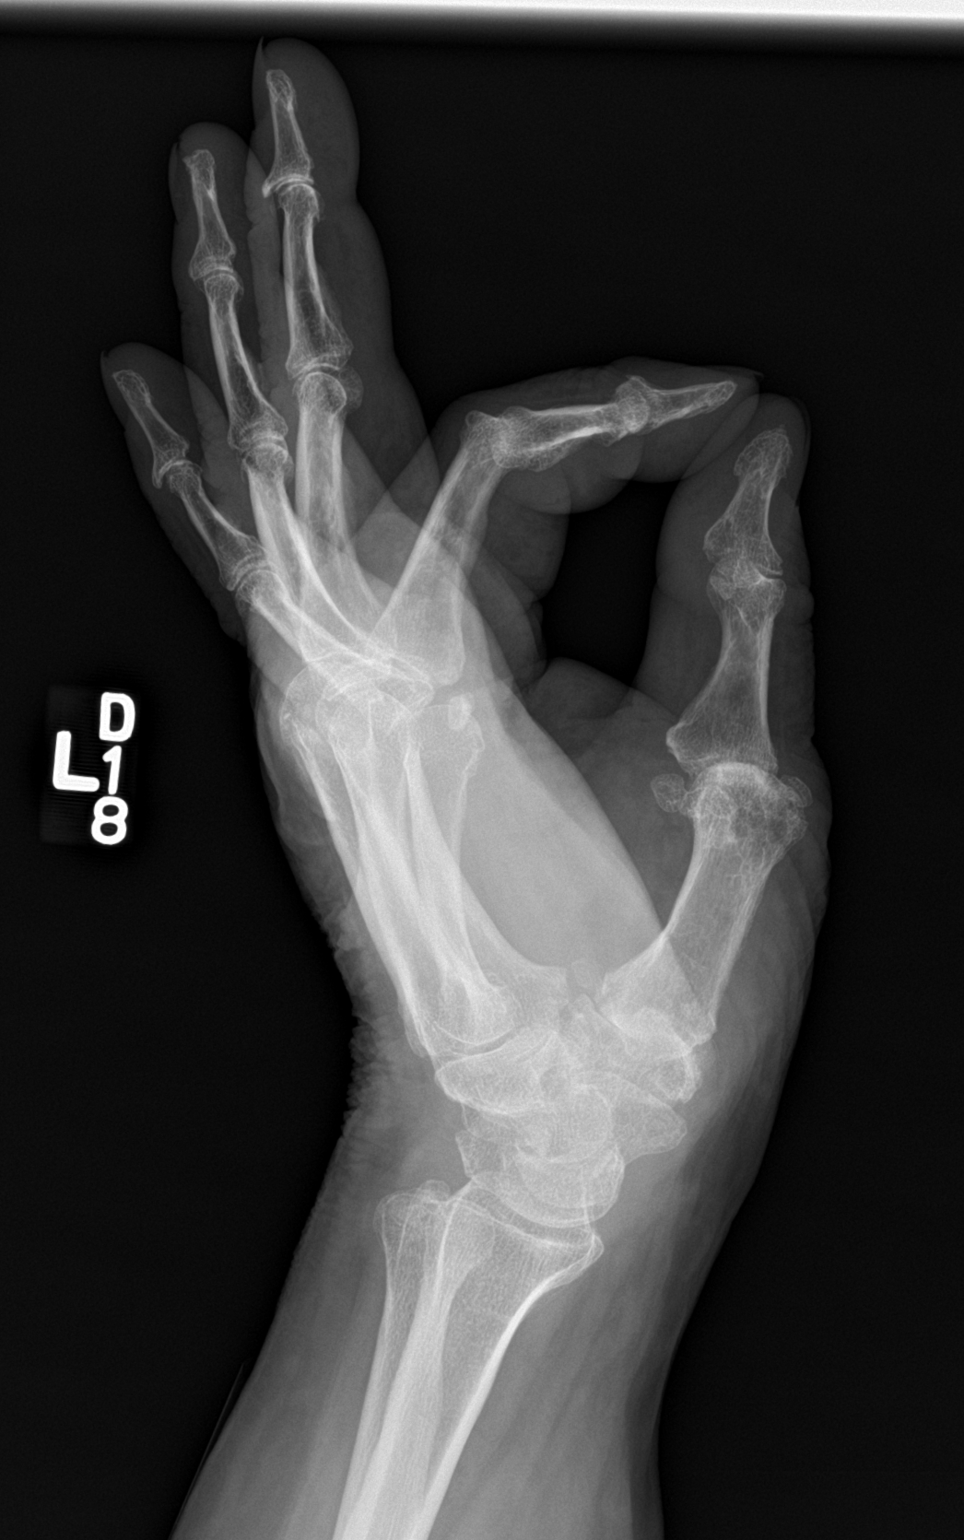

[3 of 3 positions shown; findings below may reference images not displayed]

FINDINGS: Diffuse degenerative change most prominent the first carpal
metacarpal and first metacarpophalangeal joints. No acute
abnormality identified. No radiopaque foreign bodies noted.
IMPRESSION: Diffuse degenerative change, most prominent the first
carpometacarpal and first metacarpophalangeal joints. No acute
abnormality.

## 2020-12-26 ENCOUNTER — Ambulatory Visit (INDEPENDENT_AMBULATORY_CARE_PROVIDER_SITE_OTHER): Payer: Medicare Other | Admitting: Vascular Surgery

## 2020-12-26 ENCOUNTER — Other Ambulatory Visit: Payer: Self-pay

## 2020-12-26 ENCOUNTER — Ambulatory Visit: Payer: Medicare Other | Admitting: Dermatology

## 2020-12-26 ENCOUNTER — Ambulatory Visit (INDEPENDENT_AMBULATORY_CARE_PROVIDER_SITE_OTHER): Payer: Medicare Other

## 2020-12-26 ENCOUNTER — Encounter (INDEPENDENT_AMBULATORY_CARE_PROVIDER_SITE_OTHER): Payer: Medicare Other

## 2020-12-26 ENCOUNTER — Encounter (INDEPENDENT_AMBULATORY_CARE_PROVIDER_SITE_OTHER): Payer: Self-pay | Admitting: Vascular Surgery

## 2020-12-26 VITALS — BP 130/80 | HR 80 | Ht 62.0 in | Wt 226.0 lb

## 2020-12-26 DIAGNOSIS — I872 Venous insufficiency (chronic) (peripheral): Secondary | ICD-10-CM

## 2020-12-26 DIAGNOSIS — M170 Bilateral primary osteoarthritis of knee: Secondary | ICD-10-CM

## 2020-12-26 DIAGNOSIS — I890 Lymphedema, not elsewhere classified: Secondary | ICD-10-CM | POA: Diagnosis not present

## 2020-12-26 DIAGNOSIS — E782 Mixed hyperlipidemia: Secondary | ICD-10-CM | POA: Diagnosis not present

## 2020-12-26 DIAGNOSIS — I2602 Saddle embolus of pulmonary artery with acute cor pulmonale: Secondary | ICD-10-CM

## 2020-12-26 NOTE — Progress Notes (Signed)
MRN : [id number]  Adam Knight is a 80 y.o. ([birth year]) male who presents with chief complaint of No chief complaint on file. Marland Kitchen  History of Present Illness:   The patient returns to the office for followup evaluation regarding leg swelling.  The swelling has improved quite a bit and the pain associated with swelling has decreased substantially. There have not been any interval development of a ulcerations or wounds.  Since the previous visit the patient has been wearing graduated compression stockings and has noted little significant improvement in the lymphedema. The patient has been using compression routinely morning until night.  The patient also states elevation during the day and exercise is being done too.   No outpatient medications have been marked as taking for the 12/26/20 encounter (Appointment) with Delana Meyer, Dolores Lory, MD.    Past Medical History:  Diagnosis Date  . Actinic keratosis   . Atypical mole 04/18/2020   R temple Atypical Letiginous melanocytic proliferation with regression, clear post biopsy  . Basal cell carcinoma 04/18/2020   Right mid upper forehead, [facility] 06/22/20  . 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[place])   . Dyslipidemia   . Edema   . Gastric ulcer   . OSA on CPAP   . Prediabetes   . Pulmonary embolism ([place])   . Seasonal allergies     Past Surgical History:  Procedure Laterality Date  . ENDARTERECTOMY  10/2018   Pulmonary endarterectomy for CTEPH - [facility]  . PULMONARY VENOGRAPHY Bilateral 01/08/2018   Procedure: PULMONARY VENOGRAPHY possible thrombolysis;  Surgeon: Katha Cabal, MD;  Location: [place] [facility];  Service: Cardiovascular;  Laterality: Bilateral;  . TONSILLECTOMY      Social History Social History   Tobacco Use  . Smoking status: Former Smoker    Packs/day: 0.30    Years: 7.00    Pack years: 2.10    Types: Cigarettes    Quit date: 1967    Years since quitting: 55.0  . Smokeless tobacco:  Never Used  Vaping Use  . Vaping Use: Never used  Substance Use Topics  . Alcohol use: Not Currently  . Drug use: No    Family History Family History  Problem Relation Age of Onset  . Stroke Mother   . Coronary artery disease Mother   . Osteoporosis Mother   . Rheum arthritis Mother   . Thyroid disease Mother     Allergies  Allergen Reactions  . Sulfa Antibiotics Itching and Swelling  . Spironolactone     Headache  . Penicillins Itching    Has patient had a PCN reaction causing immediate rash, facial/tongue/throat swelling, SOB or lightheadedness with hypotension: Yes Has patient had a PCN reaction causing severe rash involving mucus membranes or skin necrosis: No Has patient had a PCN reaction that required hospitalization: No Has patient had a PCN reaction occurring within the last 10 years: No If all of the above answers are "NO", then may proceed with Cephalosporin use.     REVIEW OF SYSTEMS (Negative unless checked)  Constitutional: [] Weight loss  [] Fever  [] Chills Cardiac: [] Chest pain   [] Chest pressure   [] Palpitations   [] Shortness of breath when laying flat   [] Shortness of breath with exertion. 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 Genitourinary:  [] Chronic kidney disease   [] Difficult urination  [] Frequent urination    [] Blood in urine Skin:  [] Rashes   [] Ulcers  Psychological:  [] History of anxiety   []  History of major depression.  Physical Examination  There were no vitals filed for this visit. There is no height or weight on file to calculate BMI. Gen: WD/WN, NAD Head: [place]/AT, No temporalis wasting.  Ear/Nose/Throat: Hearing grossly intact, nares w/o erythema or drainage Eyes: PER, EOMI, sclera nonicteric.  Neck: Supple, no large masses.   Pulmonary:  Good air movement, no audible wheezing bilaterally, no use of accessory muscles.  Cardiac: RRR, no JVD Vascular: scattered varicosities present bilaterally.  Mild venous stasis changes to the legs bilaterally.  2+ soft pitting edema Vessel Right Left  Radial Palpable Palpable  Gastrointestinal: Non-distended. No guarding/no peritoneal signs.  Musculoskeletal: M/S 5/5 throughout.  No deformity or atrophy.  Neurologic: CN 2-12 intact. Symmetrical.  Speech is fluent. Motor exam as listed above. Psychiatric: Judgment intact, Mood & affect appropriate for pt's clinical situation. Dermatologic: Mild venous rashes no ulcers noted.  No changes consistent with cellulitis.  CBC Lab Results  Component Value Date   WBC 7.8 03/28/2020   HGB 14.8 03/28/2020   HCT 44.2 03/28/2020   MCV 86.0 03/28/2020   PLT 411 (H) 03/28/2020    BMET    Component Value Date/Time   NA 137 03/28/2020 1034   K 3.1 (L) 03/28/2020 1034   CL 99 03/28/2020 1034   CO2 26 03/28/2020 1034   GLUCOSE 115 (H) 03/28/2020 1034   BUN 29 (H) 03/28/2020 1034   CREATININE 1.36 (H) 03/28/2020 1034   CALCIUM 8.5 (L) 03/28/2020 1034   GFRNONAA 49 (L) 03/28/2020 1034   GFRAA 57 (L) 03/28/2020 1034   CrCl cannot be calculated (Patient's most recent lab result is older than the maximum 21 days allowed.).  COAG Lab Results  Component Value Date   INR 3.3 (A) 12/14/2020   INR 2.3 11/23/2020   INR 3.4 (A) 11/09/2020    Radiology No results found.   Assessment/Plan 1. Chronic  venous insufficiency No surgery or intervention at this point in time.  I have reviewed my discussion with the patient regarding venous insufficiency and why it causes symptoms. I have discussed with the patient the chronic skin changes that accompany venous insufficiency and the long term sequela such as ulceration. Patient will contnue wearing graduated compression stockings on a daily basis, as this has provided excellent control of his edema. The patient will put the stockings on first thing in the morning and removing them in the evening. The patient is reminded not to sleep in the stockings.  In addition, behavioral modification including elevation during the day will be initiated. Exercise is strongly encouraged.  Duplex ultrasound of the bilateral lower extremity shows patent venous system with trivial reflux.  Given the patient's good control and lack of any problems regarding the venous insufficiency and lymphedema a lymph pump in not need at  this time.  The patient will follow up with me PRN should anything change.  The patient voices agreement with this plan.   2. Lymphedema of both lower extremities No surgery or intervention at this point in time.  I have reviewed my discussion with the patient regarding venous insufficiency and why it causes symptoms. I have discussed with the patient the chronic skin changes that accompany venous insufficiency and the long term sequela such as ulceration. Patient will contnue wearing graduated compression stockings on a daily basis, as this has provided excellent control of his edema. The patient will put the stockings on first thing in the morning and removing them in the evening. The patient is reminded not to sleep in the stockings.  In addition, behavioral modification including elevation during the day will be initiated. Exercise is strongly encouraged.  Duplex ultrasound of the bilateral lower extremity shows patent venous system with  trivial reflux.  Given the patient's good control and lack of any problems regarding the venous insufficiency and lymphedema a lymph pump in not need at this time.  The patient will follow up with me PRN should anything change.  The patient voices agreement with this plan.   3. Primary osteoarthritis of both knees Continue NSAID medications as already ordered, these medications have been reviewed and there are no changes at this time.  Continued activity and therapy was stressed.   4. Hyperlipidemia, mixed Continue statin as ordered and reviewed, no changes at this time   5. Acute saddle pulmonary embolism with acute cor pulmonale (HCC) Continue Coumadin therapy as ordered no changes at this time.    Hortencia Pilar, MD  12/26/2020 1:02 PM

## 2020-12-28 ENCOUNTER — Encounter (INDEPENDENT_AMBULATORY_CARE_PROVIDER_SITE_OTHER): Payer: Self-pay | Admitting: Vascular Surgery

## 2021-01-04 ENCOUNTER — Ambulatory Visit (INDEPENDENT_AMBULATORY_CARE_PROVIDER_SITE_OTHER): Payer: Medicare Other

## 2021-01-04 ENCOUNTER — Other Ambulatory Visit: Payer: Self-pay

## 2021-01-04 DIAGNOSIS — Z5181 Encounter for therapeutic drug level monitoring: Secondary | ICD-10-CM | POA: Diagnosis not present

## 2021-01-04 DIAGNOSIS — I2602 Saddle embolus of pulmonary artery with acute cor pulmonale: Secondary | ICD-10-CM

## 2021-01-04 LAB — POCT INR: INR: 3.4 — AB (ref 2.0–3.0)

## 2021-01-04 NOTE — Patient Instructions (Signed)
-   skip warfarin tonight, then -  START NEW DOSAGE of warfarin of 1 tablet (5 mg) every day EXCEPT 1/2 TABLET ON MONDAYS & FRIDAYS - Recheck INR in 3 weeks.

## 2021-01-25 ENCOUNTER — Other Ambulatory Visit: Payer: Self-pay

## 2021-01-25 ENCOUNTER — Ambulatory Visit (INDEPENDENT_AMBULATORY_CARE_PROVIDER_SITE_OTHER): Payer: Medicare Other

## 2021-01-25 DIAGNOSIS — I2602 Saddle embolus of pulmonary artery with acute cor pulmonale: Secondary | ICD-10-CM | POA: Diagnosis not present

## 2021-01-25 DIAGNOSIS — Z5181 Encounter for therapeutic drug level monitoring: Secondary | ICD-10-CM

## 2021-01-25 LAB — POCT INR: INR: 1.9 — AB (ref 2.0–3.0)

## 2021-01-25 NOTE — Patient Instructions (Signed)
-   take extra 1/2 tablet today, then  - continue dosage of warfarin of 1 tablet (5 mg) every day EXCEPT 1/2 TABLET ON MONDAYS & FRIDAYS - Recheck INR in 4 weeks.

## 2021-02-22 ENCOUNTER — Other Ambulatory Visit: Payer: Self-pay

## 2021-02-22 ENCOUNTER — Ambulatory Visit (INDEPENDENT_AMBULATORY_CARE_PROVIDER_SITE_OTHER): Payer: Medicare Other

## 2021-02-22 DIAGNOSIS — Z5181 Encounter for therapeutic drug level monitoring: Secondary | ICD-10-CM

## 2021-02-22 DIAGNOSIS — I2602 Saddle embolus of pulmonary artery with acute cor pulmonale: Secondary | ICD-10-CM | POA: Diagnosis not present

## 2021-02-22 LAB — POCT INR: INR: 2.5 (ref 2.0–3.0)

## 2021-02-22 NOTE — Patient Instructions (Signed)
-   continue dosage of warfarin of 1 tablet (5 mg) every day EXCEPT 1/2 TABLET ON MONDAYS & FRIDAYS - Recheck INR in 5 weeks.

## 2021-03-29 ENCOUNTER — Ambulatory Visit (INDEPENDENT_AMBULATORY_CARE_PROVIDER_SITE_OTHER): Payer: Medicare Other

## 2021-03-29 ENCOUNTER — Other Ambulatory Visit: Payer: Self-pay

## 2021-03-29 DIAGNOSIS — I2602 Saddle embolus of pulmonary artery with acute cor pulmonale: Secondary | ICD-10-CM

## 2021-03-29 DIAGNOSIS — Z5181 Encounter for therapeutic drug level monitoring: Secondary | ICD-10-CM

## 2021-03-29 LAB — POCT INR: INR: 1.5 — AB (ref 2.0–3.0)

## 2021-03-29 NOTE — Patient Instructions (Signed)
-   take 1.5 tablets tonight and tomorrow, then  - resume dosage of warfarin of 1 tablet (5 mg) every day EXCEPT 1/2 TABLET ON MONDAYS & FRIDAYS - Recheck INR in 4 weeks.

## 2021-04-05 ENCOUNTER — Telehealth: Payer: Self-pay | Admitting: Internal Medicine

## 2021-04-10 NOTE — Telephone Encounter (Signed)
Patient calling to make it known he wants his pharmacy to be [facility] on [address] (not by [employer]) for all future medications

## 2021-04-26 ENCOUNTER — Other Ambulatory Visit: Payer: Self-pay

## 2021-04-26 ENCOUNTER — Ambulatory Visit (INDEPENDENT_AMBULATORY_CARE_PROVIDER_SITE_OTHER): Payer: Medicare Other

## 2021-04-26 DIAGNOSIS — Z5181 Encounter for therapeutic drug level monitoring: Secondary | ICD-10-CM | POA: Diagnosis not present

## 2021-04-26 DIAGNOSIS — I2602 Saddle embolus of pulmonary artery with acute cor pulmonale: Secondary | ICD-10-CM | POA: Diagnosis not present

## 2021-04-26 LAB — POCT INR: INR: 1.8 — AB (ref 2.0–3.0)

## 2021-04-26 NOTE — Patient Instructions (Signed)
-   take 1.5 tablets tonight, then  - START NEW dosage of warfarin of 1 tablet (5 mg) every day - Recheck INR in 4 weeks.

## 2021-05-24 ENCOUNTER — Other Ambulatory Visit: Payer: Self-pay

## 2021-05-24 ENCOUNTER — Ambulatory Visit (INDEPENDENT_AMBULATORY_CARE_PROVIDER_SITE_OTHER): Payer: Medicare Other

## 2021-05-24 DIAGNOSIS — I2602 Saddle embolus of pulmonary artery with acute cor pulmonale: Secondary | ICD-10-CM

## 2021-05-24 DIAGNOSIS — Z5181 Encounter for therapeutic drug level monitoring: Secondary | ICD-10-CM | POA: Diagnosis not present

## 2021-05-24 LAB — POCT INR: INR: 3.4 — AB (ref 2.0–3.0)

## 2021-05-24 NOTE — Patient Instructions (Signed)
-   skip warfarin tonight, then  - continue dosage of warfarin of 1 tablet (5 mg) every day - Recheck INR in 3 weeks.

## 2021-05-30 ENCOUNTER — Encounter: Payer: Self-pay | Admitting: Dermatology

## 2021-05-30 ENCOUNTER — Other Ambulatory Visit: Payer: Self-pay

## 2021-05-30 ENCOUNTER — Ambulatory Visit (INDEPENDENT_AMBULATORY_CARE_PROVIDER_SITE_OTHER): Payer: Medicare Other | Admitting: Dermatology

## 2021-05-30 DIAGNOSIS — L570 Actinic keratosis: Secondary | ICD-10-CM

## 2021-05-30 DIAGNOSIS — L578 Other skin changes due to chronic exposure to nonionizing radiation: Secondary | ICD-10-CM | POA: Diagnosis not present

## 2021-05-30 DIAGNOSIS — L820 Inflamed seborrheic keratosis: Secondary | ICD-10-CM

## 2021-05-30 DIAGNOSIS — Z86018 Personal history of other benign neoplasm: Secondary | ICD-10-CM | POA: Diagnosis not present

## 2021-05-30 DIAGNOSIS — Z87898 Personal history of other specified conditions: Secondary | ICD-10-CM

## 2021-05-30 DIAGNOSIS — R21 Rash and other nonspecific skin eruption: Secondary | ICD-10-CM

## 2021-05-30 DIAGNOSIS — D692 Other nonthrombocytopenic purpura: Secondary | ICD-10-CM

## 2021-05-30 DIAGNOSIS — L821 Other seborrheic keratosis: Secondary | ICD-10-CM

## 2021-05-30 DIAGNOSIS — Z85828 Personal history of other malignant neoplasm of skin: Secondary | ICD-10-CM | POA: Diagnosis not present

## 2021-05-30 DIAGNOSIS — L814 Other melanin hyperpigmentation: Secondary | ICD-10-CM

## 2021-05-30 MED ORDER — TRIAMCINOLONE ACETONIDE 0.1 % EX OINT: TOPICAL_OINTMENT | CUTANEOUS | 0 refills | Status: DC

## 2021-05-30 NOTE — Patient Instructions (Addendum)

## 2021-05-30 NOTE — Progress Notes (Signed)
Follow-Up Visit   Subjective  Adam Knight is a 80 y.o. 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 He has a new purple spot on his left upper arm he noticed a couple of days ago.  Also has a irritated growth on his ankle and a rash on the same ankle that's been there for months.  The following portions of the chart were reviewed this encounter and updated as appropriate:       Review of Systems:  No other skin or systemic complaints except as noted in HPI or Assessment and Plan.  Objective  Well appearing patient in no apparent distress; mood and affect are within normal limits.  A focused examination was performed including face, arms. Relevant physical exam findings are noted in the Assessment and Plan.  Objective  Right Temple: Scar with no evidence of recurrence.   Objective  L hand dorsum x 2, forehead x 4 (6): Pink scaly macules.  Objective  L lateral ankle: Erythematous keratotic or waxy stuck-on papule   Objective  Left Medial Ankle: Well-demarcated erythematous scaly plaque of the left medial ankle.  Images     Assessment & Plan   Actinic Damage - chronic, secondary to cumulative UV radiation exposure/sun exposure over time - diffuse scaly erythematous macules with underlying dyspigmentation - Recommend daily broad spectrum sunscreen SPF 30+ to sun-exposed areas, reapply every 2 hours as needed.  - Recommend staying in the shade or wearing long sleeves, sun glasses (UVA+UVB protection) and wide brim hats (4-inch brim around the entire circumference of the hat). - Call for new or changing lesions.  Lentigines - Scattered tan macules - Due to sun exposure - Benign-appering, observe - Recommend daily broad spectrum sunscreen SPF 30+ to sun-exposed areas, reapply every 2 hours as needed. - Call for any  changes  History of Basal Cell Carcinoma of the Skin - No evidence of recurrence today of the right mid upper forehead, s/p Mohs - Recommend regular full body skin exams - Recommend daily broad spectrum sunscreen SPF 30+ to sun-exposed areas, reapply every 2 hours as needed.  - Call if any new or changing lesions are noted between office visits  Purpura - Chronic; persistent and recurrent.  Treatable, but not curable. - Violaceous macules and patches - Benign - Related to trauma, age, sun damage and/or use of blood thinners, chronic use of topical and/or oral steroids - Observe - Can use OTC arnica containing moisturizer such as Dermend Bruise Formula if desired - Call for worsening or other concerns  Seborrheic Keratoses - Stuck-on, waxy, tan-brown papules and/or plaques  - Benign-appearing - Discussed benign etiology and prognosis. - Observe - Call for any changes  History of atypical nevus Right Temple  Clear with biopsy. No evidence of recurrence.  AK (actinic keratosis) (6) L hand dorsum x 2, forehead x 4  Prior to procedure, discussed risks of blister formation, small wound, skin dyspigmentation, or rare scar following cryotherapy.    Destruction of lesion - L hand dorsum x 2, forehead x 4  Destruction method: cryotherapy   Informed consent: discussed and consent obtained   Lesion destroyed using liquid nitrogen: Yes   Region frozen until ice ball extended beyond lesion: Yes   Outcome: patient tolerated procedure well with no complications   Post-procedure details: wound care instructions given    Inflamed seborrheic keratosis L lateral ankle  Prior to procedure, discussed risks of blister formation, small wound, skin dyspigmentation, or rare scar following cryotherapy.   Recheck on f/up   Destruction of lesion - L lateral ankle  Destruction method: cryotherapy   Informed consent: discussed and consent obtained   Lesion destroyed using liquid nitrogen: Yes    Region frozen until ice ball extended beyond lesion: Yes   Outcome: patient tolerated procedure well with no complications   Post-procedure details: wound care instructions given    Rash Left Medial Ankle  Possible Psoriasis  Start TMC 0.1% Ointment apply to AA rash on left med ankle BID until improved dsp 30g 0Rf. Avoid face, groin, axilla.  Topical steroids (such as triamcinolone, fluocinolone, fluocinonide, mometasone, clobetasol, halobetasol, betamethasone, hydrocortisone) can cause thinning and lightening of the skin if they are used for too long in the same area. Your physician has selected the right strength medicine for your problem and area affected on the body. Please use your medication only as directed by your physician to prevent side effects.    triamcinolone ointment (KENALOG) 0.1 % - Left Medial Ankle  Return in about 1 month (around 06/29/2021) for recheck left medial and lat ankle.   IJamesetta Orleans, CMA, am acting as scribe for Brendolyn Patty, MD .  Documentation: I have reviewed the above documentation for accuracy and completeness, and I agree with the above.  Brendolyn Patty MD

## 2021-06-14 ENCOUNTER — Other Ambulatory Visit: Payer: Self-pay

## 2021-06-14 ENCOUNTER — Ambulatory Visit (INDEPENDENT_AMBULATORY_CARE_PROVIDER_SITE_OTHER): Payer: Medicare Other

## 2021-06-14 DIAGNOSIS — Z5181 Encounter for therapeutic drug level monitoring: Secondary | ICD-10-CM

## 2021-06-14 DIAGNOSIS — I2602 Saddle embolus of pulmonary artery with acute cor pulmonale: Secondary | ICD-10-CM

## 2021-06-14 LAB — POCT INR: INR: 2.1 (ref 2.0–3.0)

## 2021-06-14 NOTE — Patient Instructions (Signed)
-   continue dosage of warfarin of 1 tablet (5 mg) every day - Recheck INR in 4 weeks.

## 2021-06-19 ENCOUNTER — Other Ambulatory Visit: Payer: Self-pay

## 2021-06-19 MED ORDER — WARFARIN SODIUM 5 MG PO TABS: ORAL_TABLET | ORAL | 1 refills | Status: DC

## 2021-07-05 ENCOUNTER — Other Ambulatory Visit: Payer: Self-pay

## 2021-07-05 ENCOUNTER — Ambulatory Visit: Payer: Medicare Other | Admitting: Dermatology

## 2021-07-05 ENCOUNTER — Ambulatory Visit (INDEPENDENT_AMBULATORY_CARE_PROVIDER_SITE_OTHER): Payer: Medicare Other | Admitting: Dermatology

## 2021-07-05 DIAGNOSIS — Z85828 Personal history of other malignant neoplasm of skin: Secondary | ICD-10-CM

## 2021-07-05 DIAGNOSIS — R21 Rash and other nonspecific skin eruption: Secondary | ICD-10-CM | POA: Diagnosis not present

## 2021-07-05 DIAGNOSIS — Z872 Personal history of diseases of the skin and subcutaneous tissue: Secondary | ICD-10-CM

## 2021-07-05 DIAGNOSIS — Z86018 Personal history of other benign neoplasm: Secondary | ICD-10-CM

## 2021-07-05 DIAGNOSIS — I872 Venous insufficiency (chronic) (peripheral): Secondary | ICD-10-CM | POA: Diagnosis not present

## 2021-07-05 DIAGNOSIS — L820 Inflamed seborrheic keratosis: Secondary | ICD-10-CM

## 2021-07-05 DIAGNOSIS — L409 Psoriasis, unspecified: Secondary | ICD-10-CM | POA: Diagnosis not present

## 2021-07-05 MED ORDER — TRIAMCINOLONE ACETONIDE 0.1 % EX OINT: TOPICAL_OINTMENT | CUTANEOUS | 0 refills | Status: DC

## 2021-07-05 NOTE — Patient Instructions (Signed)

## 2021-07-05 NOTE — Progress Notes (Signed)
   Follow-Up Visit   Subjective  Adam Knight is a 80 y.o. male who presents for the following: Follow-up (Likely psoriasis follow up of left medial ankle - seems better after using TMC 0.1% oint. Also ISK follow up of left lat ankle treated with LN2).    The following portions of the chart were reviewed this encounter and updated as appropriate:       Review of Systems:  No other skin or systemic complaints except as noted in HPI or Assessment and Plan.  Objective  Well appearing patient in no apparent distress; mood and affect are within normal limits.  A focused examination was performed including left leg/foot. Relevant physical exam findings are noted in the Assessment and Plan.  Left lat ankle Clear today  Left medial ankle Violaceous plaque with thinning - no scale  Left pretibial Yellow brown discoloration   Assessment & Plan  Inflamed seborrheic keratosis Left lat ankle  Resolved  Psoriasis Left medial ankle  Psoriasis vs angiodermatitis - Improving   Decrease TMC 0.1% oint qd x ~ 2-4 weeks, then discontinue. May restart if area flares.      Venous stasis dermatitis of left lower extremity Left pretibial  Stasis in the legs causes chronic leg swelling, which may result in itchy or painful rashes, skin discoloration, skin texture changes, and sometimes ulceration.  Recommend daily compression hose/stockings- easiest to put on first thing in morning, remove at bedtime.  Elevate legs as much as possible. Avoid salt/sodium rich foods.  Rash  Related Medications triamcinolone ointment (KENALOG) 0.1 % Apply to rash on left ankle twice a day until improved. Avoid face, groin, underarms.  History of PreCancerous Actinic Keratosis  - site(s) of PreCancerous Actinic Keratosis clear today. Face/forehead - these may recur and new lesions may form requiring treatment to prevent transformation into skin cancer - observe for new or changing spots and contact  [place] for appointment if occur - photoprotection with sun protective clothing; sunglasses and broad spectrum sunscreen with SPF of at least 30 + and frequent self skin exams recommended - yearly exams by a dermatologist recommended for persons with history of PreCancerous Actinic Keratoses   History of Atypical Nevus R temple - No evidence of recurrence today - Recommend regular full body skin exams - Recommend daily broad spectrum sunscreen SPF 30+ to sun-exposed areas, reapply every 2 hours as needed.  - Call if any new or changing lesions are noted between office visits   History of Basal Cell Carcinoma of the Skin - No evidence of recurrence today forehead s/p Mohs 6/21 - Recommend regular full body skin exams - Recommend daily broad spectrum sunscreen SPF 30+ to sun-exposed areas, reapply every 2 hours as needed.  - Call if any new or changing lesions are noted between office visits   Return in about 6 months (around 01/05/2022) for AKs, h/o BCC.  I, Ashok Cordia, CMA, am acting as scribe for Brendolyn Patty, MD .  Documentation: I have reviewed the above documentation for accuracy and completeness, and I agree with the above.  Brendolyn Patty MD

## 2021-07-12 ENCOUNTER — Ambulatory Visit (INDEPENDENT_AMBULATORY_CARE_PROVIDER_SITE_OTHER): Payer: Medicare Other

## 2021-07-12 ENCOUNTER — Other Ambulatory Visit: Payer: Self-pay

## 2021-07-12 DIAGNOSIS — Z5181 Encounter for therapeutic drug level monitoring: Secondary | ICD-10-CM

## 2021-07-12 DIAGNOSIS — I2602 Saddle embolus of pulmonary artery with acute cor pulmonale: Secondary | ICD-10-CM | POA: Diagnosis not present

## 2021-07-12 LAB — POCT INR: INR: 4 — AB (ref 2.0–3.0)

## 2021-07-12 NOTE — Patient Instructions (Signed)
-   skip warfarin tonight, then  - continue dosage of warfarin of 1 tablet (5 mg) every day - Recheck INR in 4 weeks.

## 2021-08-09 ENCOUNTER — Other Ambulatory Visit: Payer: Self-pay

## 2021-08-09 ENCOUNTER — Ambulatory Visit (INDEPENDENT_AMBULATORY_CARE_PROVIDER_SITE_OTHER): Payer: Medicare Other

## 2021-08-09 DIAGNOSIS — I2602 Saddle embolus of pulmonary artery with acute cor pulmonale: Secondary | ICD-10-CM

## 2021-08-09 DIAGNOSIS — Z5181 Encounter for therapeutic drug level monitoring: Secondary | ICD-10-CM

## 2021-08-09 LAB — POCT INR: INR: 2.8 (ref 2.0–3.0)

## 2021-08-09 NOTE — Patient Instructions (Signed)
-   continue dosage of warfarin of 1 tablet (5 mg) every day - Recheck INR in 5 weeks.

## 2021-08-17 ENCOUNTER — Other Ambulatory Visit: Payer: Self-pay | Admitting: Dermatology

## 2021-08-17 DIAGNOSIS — R21 Rash and other nonspecific skin eruption: Secondary | ICD-10-CM

## 2021-08-24 ENCOUNTER — Other Ambulatory Visit: Payer: Self-pay

## 2021-08-24 ENCOUNTER — Ambulatory Visit (INDEPENDENT_AMBULATORY_CARE_PROVIDER_SITE_OTHER): Payer: Medicare Other | Admitting: Internal Medicine

## 2021-08-24 ENCOUNTER — Encounter: Payer: Self-pay | Admitting: Internal Medicine

## 2021-08-24 VITALS — BP 118/78 | HR 90 | Ht 62.0 in | Wt 227.0 lb

## 2021-08-24 DIAGNOSIS — I2724 Chronic thromboembolic pulmonary hypertension: Secondary | ICD-10-CM | POA: Diagnosis not present

## 2021-08-24 DIAGNOSIS — R079 Chest pain, unspecified: Secondary | ICD-10-CM

## 2021-08-24 DIAGNOSIS — R0609 Other forms of dyspnea: Secondary | ICD-10-CM

## 2021-08-24 DIAGNOSIS — R0600 Dyspnea, unspecified: Secondary | ICD-10-CM

## 2021-08-24 DIAGNOSIS — R0602 Shortness of breath: Secondary | ICD-10-CM

## 2021-08-24 NOTE — Progress Notes (Signed)
Follow-up Outpatient Visit Date: 08/24/2021  Primary Care Provider: Barbaraann Boys, MD [address] [place] [address]  Chief Complaint: Shortness of breath and diaphoresis  HPI:  Adam Knight is a 80 y.o. 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  I last saw him in 07/2020, at which time he noted intermittent pain in his left thigh as well as tingling in his feet.  Minimal shortness of breath was reported.  He underwent echocardiogram 3 months ago at [facility], showing normal LVEF and mild left atrial enlargement.  RV was normal in size with normal systolic function.  PA pressure could not be estimated.  [place] reports that he feels like his breathing has worsened since last year.  He has also experienced episodes of diaphoresis when walking as little as 20 steps.  A year ago, he was able to walk around the [place] field" twice.  This year he can only walk around it once and feels like he needs to stop.  He sometimes will obstruct his left nare, and feels like his breathing improves when he only breathes through the right nostril.  He notes an episode of chest pain that occurred a few days ago at rest.  He cannot describe it further.  He also has some pressure in his chest when walking.  Chronic leg edema is stable.  He denies palpitations and lightheadedness.  --------------------------------------------------------------------------------------------------  Past Medical History:  Diagnosis Date   Actinic keratosis    Atypical mole 04/18/2020   R temple Atypical Letiginous melanocytic proliferation with regression, clear post biopsy   Basal cell carcinoma 04/18/2020   Right mid upper forehead, MOHs 06/22/20   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HCC)    Dyslipidemia    Edema    Gastric ulcer    OSA on  CPAP    Prediabetes    Pulmonary embolism (HCC)    Seasonal allergies    Past Surgical History:  Procedure Laterality Date   ENDARTERECTOMY  10/2018   Pulmonary endarterectomy for CTEPH - [facility]   PULMONARY VENOGRAPHY Bilateral 01/08/2018   Procedure: PULMONARY VENOGRAPHY possible thrombolysis;  Surgeon: Katha Cabal, MD;  Location: [facility];  Service: Cardiovascular;  Laterality: Bilateral;   TONSILLECTOMY      Current Meds  Medication Sig   B Complex-C (SUPER B COMPLEX PO) Take by mouth daily.   busPIRone (BUSPAR) 10 MG tablet Take by mouth 2 (two) times daily.   Calcium Carbonate Antacid (CALCIUM CARBONATE, DOSED IN MG ELEMENTAL CALCIUM,) 1250 MG/5ML SUSP Take by mouth.   Cholecalciferol (VITAMIN D) 2000 UNITS tablet Take 2,000 Units by mouth daily.   potassium chloride (KLOR-CON) 10 MEQ tablet Take by mouth. 40 MEQ IN THE MORNING AND 30 MEQ AT NIGHT.   torsemide (DEMADEX) 20 MG tablet Take 2 tablets (40 mg total) by mouth daily.   triamcinolone ointment (KENALOG) 0.1 % APPLY TO RASH ON LEFT ANKLE TWICE A DAY UNTIL IMPROVED. AVOID FACE, GROIN, UNDERARMS.   warfarin (COUMADIN) 5 MG tablet Take 1 tablet daily or as directed    Allergies: Sulfa antibiotics, Spironolactone, and Penicillins  Social History   Tobacco Use   Smoking status: Former    Packs/day: 0.30    Years: 7.00    Pack years: 2.10    Types: Cigarettes    Quit date: 33    Years  since quitting: 55.7   Smokeless tobacco: Never  Vaping Use   Vaping Use: Never used  Substance Use Topics   Alcohol use: Not Currently   Drug use: No    Family History  Problem Relation Age of Onset   Stroke Mother    Coronary artery disease Mother    Osteoporosis Mother    Rheum arthritis Mother    Thyroid disease Mother     Review of Systems: A 12-system review of systems was performed and was negative except as noted in the  HPI.  --------------------------------------------------------------------------------------------------  Physical Exam: BP 118/78 (BP Location: Left Arm, Patient Position: Sitting, Cuff Size: Large)   Pulse 90   Ht '5\' 2"'$  (1.575 m)   Wt 227 lb (103 kg)   SpO2 94%   BMI 41.52 kg/m   General:  NAD. Neck: No JVD or HJR. Lungs: Coarse breath sounds without wheezes or crackles. Heart: Regular rate and rhythm without murmurs, rubs, or gallops. Abdomen: Soft, nontender, nondistended. Extremities: Trace pretibial edema, left greater than right.  EKG: Normal sinus rhythm with left axis deviation, right bundle branch block, and anterolateral T wave inversions.  Compared with prior tracing from 08/10/2020, anterolateral T wave inversions are more pronounced.  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  Recent echo through [facility] showed preserved LVEF with normal right ventricular function.  PA pressure could not be estimated.  Mr. Peaches appears grossly euvolemic on exam today.  We will defer medication changes at this time.  He should continue to follow with Dr. Marijean Bravo at [facility].  He will remain on lifelong anticoagulation with warfarin.  Dyspnea on exertion and chest pain: I suspect dyspnea is driven largely by pulmonary hypertension and history of pulmonary emboli.  However, Mr. Picton  also reports some exertional chest pressure and recent episode of atypical chest pain at rest.  His EKG today shows a chronic right bundle branch block though anterolateral T wave inversions are more pronounced.  We have discussed further evaluation options including cardiac catheterization, coronary CTA, and myocardial perfusion stress test.  Mr. Rauseo is concerned about effects of IV contrast on his kidneys, as he was previously told by his surgeon at [facility] that contrast could push him to dialysis.  His creatinine has been only mildly elevated on most recent check.  However, given his concern about kidney function, we will obtain a pharmacologic myocardial perfusion stress test.  We will defer medication changes at this time and continue with chronic anticoagulation with warfarin and lieu of aspirin given history of CTEPH.  Given that he also notes changes in his breathing when he obstructs his left nostril, ENT evaluation may be helpful, particularly if MPI is low risk.  Morbid obesity: BMI remains greater than 40.  I suspect that morbid obesity and deconditioning are contributing to some of his shortness of breath.  Weight  loss encouraged through diet and exercise.  Shared Decision Making/Informed Consent The risks [chest pain, shortness of breath, cardiac arrhythmias, dizziness, blood pressure fluctuations, myocardial infarction, stroke/transient ischemic attack, nausea, vomiting, allergic reaction, radiation exposure, metallic taste sensation and life-threatening complications (estimated to be 1 in 10,000)], benefits (risk stratification, diagnosing coronary artery disease, treatment guidance) and alternatives of a nuclear stress test were discussed in detail with Mr. Kirley and he agrees to proceed.  Follow-up: Return to clinic in 1 month.  Nelva Bush, MD 08/24/2021 9:57 AM

## 2021-08-24 NOTE — Patient Instructions (Signed)
Medication Instructions:   Your physician recommends that you continue on your current medications as directed. Please refer to the Current Medication list given to you today.  *If you need a refill on your cardiac medications before your next appointment, please call your pharmacy*   Lab Work:  None ordered  Testing/Procedures:  [place]  Your caregiver has ordered a Clinical cytogeneticist) Stress Test with nuclear imaging. The purpose of this test is to evaluate the blood supply to your heart muscle. This procedure is referred to as a "Non-Invasive Stress Test." This is because other than having an IV started in your vein, nothing is inserted or "invades" your body. Cardiac stress tests are done to find areas of poor blood flow to the heart by determining the extent of coronary artery disease (CAD). Some patients exercise on a treadmill, which naturally increases the blood flow to your heart, while others who are  unable to walk on a treadmill due to physical limitations have a pharmacologic/chemical stress agent called Lexiscan . This medicine will mimic walking on a treadmill by temporarily increasing your coronary blood flow.   Please note: these test may take anywhere between 2-4 hours to complete  PLEASE REPORT TO [facility] AT THE FIRST DESK WILL DIRECT YOU WHERE TO GO  Date of Procedure:_____________________________________  Arrival Time for Procedure:______________________________  Instructions regarding medication:   ____ : Hold diabetes medication morning of procedure  ____:  Hold betablocker(s) night before procedure and morning of procedure  __X__:  Hold other medications as follows: Torsemide morning of procedure   PLEASE NOTIFY THE OFFICE AT LEAST 24 HOURS IN ADVANCE IF YOU ARE UNABLE TO KEEP YOUR APPOINTMENT.  [phone] AND  PLEASE NOTIFY NUCLEAR MEDICINE AT [facility] AT LEAST 24 HOURS IN ADVANCE IF YOU ARE UNABLE TO KEEP YOUR  APPOINTMENT. [phone]  How to prepare for your Myoview test:  Do not eat or drink after midnight No caffeine for 24 hours prior to test No smoking 24 hours prior to test. Your medication may be taken with water.  If your doctor stopped a medication because of this test, do not take that medication. Please wear a short sleeve shirt. No perfume, cologne or lotion.   Follow-Up: At [facility], you and your health needs are our priority.  As part of our continuing mission to provide you with exceptional heart care, we have created designated Provider Care Teams.  These Care Teams include your primary Cardiologist (physician) and Advanced Practice Providers (APPs -  Physician Assistants and Nurse Practitioners) who all work together to provide you with the care you need, when you need it.  We recommend signing up for the patient portal called "MyChart".  Sign up information is provided on this After Visit Summary.  MyChart is used to connect with patients for Virtual Visits (Telemedicine).  Patients are able to view lab/test results, encounter notes, upcoming appointments, etc.  Non-urgent messages can be sent to your provider as well.   To learn more about what you can do with MyChart, go to [email or web address].    Your next appointment:   1 month(s)  The format for your next appointment:   In Person  Provider:   You may see Nelva Bush, MD or one of the following Advanced Practice Providers on your designated Care Team:   Murray Hodgkins, NP Christell Faith, PA-C Marrianne Mood, PA-C Cadence [place], [place]

## 2021-08-25 ENCOUNTER — Encounter: Payer: Self-pay | Admitting: Internal Medicine

## 2021-08-29 ENCOUNTER — Ambulatory Visit
Admission: RE | Admit: 2021-08-29 | Discharge: 2021-08-29 | Disposition: A | Discharge status: Home / Self Care | Payer: Medicare Other | Source: Ambulatory Visit | Attending: Internal Medicine | Admitting: Internal Medicine

## 2021-08-29 ENCOUNTER — Other Ambulatory Visit: Payer: Self-pay

## 2021-08-29 DIAGNOSIS — R0600 Dyspnea, unspecified: Secondary | ICD-10-CM | POA: Diagnosis present

## 2021-08-29 DIAGNOSIS — R079 Chest pain, unspecified: Secondary | ICD-10-CM | POA: Insufficient documentation

## 2021-08-29 DIAGNOSIS — R0609 Other forms of dyspnea: Secondary | ICD-10-CM

## 2021-08-29 LAB — NM MYOCAR MULTI W/SPECT W/WALL MOTION / EF
Base ST Depression (mm): 0 mm
Estimated workload: 1
Exercise duration (min): 0 min
Exercise duration (sec): 0 s
LV dias vol: 84 mL (ref 62–150)
LV sys vol: 21 mL
MPHR: 140 {beats}/min
Nuc Stress EF: 75 %
Peak HR: 99 {beats}/min
Percent HR: 70 %
Rest HR: 71 {beats}/min
Rest Nuclear Isotope Dose: 10.7 mCi
SDS: 1
SRS: 4
SSS: 6
ST Depression (mm): 0 mm
Stress Nuclear Isotope Dose: 30.1 mCi
TID: 0.87

## 2021-08-29 MED ORDER — TECHNETIUM TC 99M TETROFOSMIN IV KIT
30.0000 | PACK | Freq: Once | INTRAVENOUS | Status: AC
  Administered 2021-08-29: 30.13 via INTRAVENOUS

## 2021-08-29 MED ORDER — REGADENOSON 0.4 MG/5ML IV SOLN
0.4000 mg | Freq: Once | INTRAVENOUS | Status: AC
  Administered 2021-08-29: 0.4 mg via INTRAVENOUS

## 2021-08-29 MED ORDER — TECHNETIUM TC 99M TETROFOSMIN IV KIT
10.0000 | PACK | Freq: Once | INTRAVENOUS | Status: AC | PRN
  Administered 2021-08-29: 10.7 via INTRAVENOUS

## 2021-09-01 ENCOUNTER — Telehealth: Payer: Self-pay | Admitting: Internal Medicine

## 2021-09-01 NOTE — Telephone Encounter (Signed)
Patient calling to discuss recent stress testing results  ° °Please call  °  °

## 2021-09-01 NOTE — Telephone Encounter (Signed)
RC:[id number]) on 08/29/2021 - Order Result History Report  NM Myocar Multi W/Spect W/Wall Motion / EF: Result Notes  Nelva Bush, MD  08/31/2021  4:49 PM EDT     Low risk study.  Left message for patient to call [facility] office tomorrow so that we can review results.  Favor medical therapy and follow-up as previously discussed.

## 2021-09-01 NOTE — Telephone Encounter (Signed)
Attempted to call pt. No answer. Since Friday afternoon at 5 PM, left detailed message with results on vm (DPR approved). Requested on pt's vm that he call back with any further questions.

## 2021-09-05 NOTE — Telephone Encounter (Signed)
Pt returned call.  Notified pt of results of stress test and Dr. Darnelle Bos recc.  Pt voiced understanding. No further questions.  Pt will follow up as scheduled 10/05/21.

## 2021-09-13 ENCOUNTER — Ambulatory Visit (INDEPENDENT_AMBULATORY_CARE_PROVIDER_SITE_OTHER): Payer: Medicare Other

## 2021-09-13 ENCOUNTER — Other Ambulatory Visit: Payer: Self-pay

## 2021-09-13 DIAGNOSIS — Z5181 Encounter for therapeutic drug level monitoring: Secondary | ICD-10-CM

## 2021-09-13 DIAGNOSIS — I2602 Saddle embolus of pulmonary artery with acute cor pulmonale: Secondary | ICD-10-CM | POA: Diagnosis not present

## 2021-09-13 LAB — POCT INR: INR: 3.2 — AB (ref 2.0–3.0)

## 2021-09-13 NOTE — Patient Instructions (Signed)
-   take 1/2 tablet warfarin tonight, then  - continue dosage of warfarin of 1 tablet (5 mg) every day - Recheck INR in 5 weeks.

## 2021-10-04 NOTE — Progress Notes (Signed)
Follow-up Outpatient Visit Date: 10/05/2021  Primary Care Provider: Barbaraann Boys, MD [address] [place] [address]  Chief Complaint: Shortness of breath and weight gain  HPI:  Adam Knight is a 80 y.o. 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  I last saw him a month ago, at which time Mr. Bail reported worsening shortness of breath over the last year as well as diaphoresis with minimal activity.  We obtained a pharmacologic myocardial perfusion stress test to exclude underlying CAD as an anginal equivalent; this was low risk with small fixed apical inferior and apical lateral defect most consistent with scar but could not rule out artifact.  LVEF was normal.  No significant coronary artery calcification was observed.  Today, Mr. Lata reports that he feels about the same as at our last visit, with continued dyspnea with mild activity.  He denies chest pain, palpitations, and lightheadedness.  He has noticed some leg edema, which he attributes to eating out ([place] food in particular).  He has put on over 10 pounds since our last visit ~6 weeks ago.  He remains complaint with his medications, including torsemide 40 mg daily and warfarin.  --------------------------------------------------------------------------------------------------  Past Medical History:  Diagnosis Date   Actinic keratosis    Atypical mole 04/18/2020   R temple Atypical Letiginous melanocytic proliferation with regression, clear post biopsy   Basal cell carcinoma 04/18/2020   Right mid upper forehead, MOHs 06/22/20   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HCC)    Dyslipidemia    Edema    Gastric ulcer    OSA on CPAP    Prediabetes    Pulmonary embolism ([place])    Seasonal allergies    Past  Surgical History:  Procedure Laterality Date   ENDARTERECTOMY  10/2018   Pulmonary endarterectomy for CTEPH - [facility]   PULMONARY VENOGRAPHY Bilateral 01/08/2018   Procedure: PULMONARY VENOGRAPHY possible thrombolysis;  Surgeon: Katha Cabal, MD;  Location: [facility];  Service: Cardiovascular;  Laterality: Bilateral;   TONSILLECTOMY      Current Meds  Medication Sig   busPIRone (BUSPAR) 10 MG tablet Take by mouth 2 (two) times daily.   Calcium Carbonate Antacid (CALCIUM CARBONATE, DOSED IN MG ELEMENTAL CALCIUM,) 1250 MG/5ML SUSP Take by mouth daily.   Cholecalciferol (VITAMIN D) 2000 UNITS tablet Take 2,000 Units by mouth daily.   potassium chloride (KLOR-CON) 10 MEQ tablet Take by mouth. 40 MEQ IN THE MORNING AND 30 MEQ AT NIGHT.   torsemide (DEMADEX) 20 MG tablet Take 2 tablets (40 mg total) by mouth daily.   triamcinolone ointment (KENALOG) 0.1 % APPLY TO RASH ON LEFT ANKLE TWICE A DAY UNTIL IMPROVED. AVOID FACE, GROIN, UNDERARMS.   warfarin (COUMADIN) 5 MG tablet Take 1 tablet daily or as directed    Allergies: Sulfa antibiotics, Spironolactone, and Penicillins  Social History   Tobacco Use   Smoking status: Former    Packs/day: 0.30    Years: 7.00    Pack years: 2.10    Types: Cigarettes    Quit date: 46    Years since quitting: 55.8   Smokeless tobacco: Never  Vaping Use   Vaping Use: Never used  Substance Use Topics   Alcohol use: Not Currently   Drug use: No    Family History  Problem  Relation Age of Onset   Stroke Mother    Coronary artery disease Mother    Osteoporosis Mother    Rheum arthritis Mother    Thyroid disease Mother     Review of Systems: A 12-system review of systems was performed and was negative except as noted in the HPI.  --------------------------------------------------------------------------------------------------  Physical Exam: BP 130/78 (BP Location: Left Arm, Patient Position: Sitting, Cuff Size: Large)   Pulse  98   Ht 5\' 2"  (1.575 m)   Wt 238 lb (108 kg)   SpO2 95%   BMI 43.53 kg/m   General:  NAD. Neck: Difficult to assess JVP due to body habitus. Lungs: Clear to auscultation bilaterally without wheezes or crackles. Heart: Regular rate and rhythm without murmurs, rubs, or gallops. Abdomen: Soft, nontender, nondistended. Extremities: 1+ pretibial edema.   Lab Results  Component Value Date   WBC 7.8 03/28/2020   HGB 14.8 03/28/2020   HCT 44.2 03/28/2020   MCV 86.0 03/28/2020   PLT 411 (H) 03/28/2020    Lab Results  Component Value Date   NA 137 03/28/2020   K 3.1 (L) 03/28/2020   CL 99 03/28/2020   CO2 26 03/28/2020   BUN 29 (H) 03/28/2020   CREATININE 1.36 (H) 03/28/2020   GLUCOSE 115 (H) 03/28/2020   ALT 24 03/09/2019    No results found for: CHOL, HDL, LDLCALC, LDLDIRECT, TRIG, CHOLHDL  --------------------------------------------------------------------------------------------------  ASSESSMENT AND PLAN: Acute on chronic HFpEF: Mr. Evetts reports stable DOE with increased leg edema as well as 11 pound weight gain since early September, consistent with NYHA class III HFpEF.  We have discussed importance of sodium and fluid restriction.  We will also increase torsemide to 40 mg BID x 3 days.  Mr. Dunnigan should contact us early next week if he does not observe any decrease in his weight and/or edema.  I will check a BMP today in the setting of torsemide escalation.  Recent Myoview was low risk - no further ischemia evaluation recommended at this time.  CTEPH: Increase torsemide, as above.  Continue indefinite anticoagulation with warfarin.  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  Weight loss encouraged.  Follow-up: Return to clinic in 1 month.  Nelva Bush, MD 10/05/2021 7:57 AM

## 2021-10-05 ENCOUNTER — Other Ambulatory Visit: Payer: Self-pay

## 2021-10-05 ENCOUNTER — Encounter: Payer: Self-pay | Admitting: Internal Medicine

## 2021-10-05 ENCOUNTER — Ambulatory Visit (INDEPENDENT_AMBULATORY_CARE_PROVIDER_SITE_OTHER): Payer: Medicare Other | Admitting: Internal Medicine

## 2021-10-05 VITALS — BP 130/78 | HR 98 | Ht 62.0 in | Wt 238.0 lb

## 2021-10-05 DIAGNOSIS — I5033 Acute on chronic diastolic (congestive) heart failure: Secondary | ICD-10-CM | POA: Diagnosis not present

## 2021-10-05 DIAGNOSIS — I2724 Chronic thromboembolic pulmonary hypertension: Secondary | ICD-10-CM

## 2021-10-05 DIAGNOSIS — R0609 Other forms of dyspnea: Secondary | ICD-10-CM | POA: Diagnosis not present

## 2021-10-05 NOTE — Patient Instructions (Addendum)
Medication Instructions:  - Your physician has recommended you make the following change in your medication:   1) INCREASE torsemide 20 mg: - take 2 tablets (40 mg) by mouth TWICE daily x 3 days, then - resume 2 tablets (40 mg) by mouth ONCE daily    ** CALL the office on Monday 10/17, if you have not lost any weight due to the increase in your fluid pills**  *If you need a refill on your cardiac medications before your next appointment, please call your pharmacy*   Lab Work: - Your physician recommends that you have lab work today: BMP  If you have labs (blood work) drawn today and your tests are completely normal, you will receive your results only by: [place] (if you have [place]) OR A paper copy in the mail If you have any lab test that is abnormal or we need to change your treatment, we will call you to review the results.   Testing/Procedures: - none ordered   Follow-Up: At [facility], you and your health needs are our priority.  As part of our continuing mission to provide you with exceptional heart care, we have created designated Provider Care Teams.  These Care Teams include your primary Cardiologist (physician) and Advanced Practice Providers (APPs -  Physician Assistants and Nurse Practitioners) who all work together to provide you with the care you need, when you need it.  We recommend signing up for the patient portal called "MyChart".  Sign up information is provided on this After Visit Summary.  MyChart is used to connect with patients for Virtual Visits (Telemedicine).  Patients are able to view lab/test results, encounter notes, upcoming appointments, etc.  Non-urgent messages can be sent to your provider as well.   To learn more about what you can do with MyChart, go to [email or web address].    Your next appointment:   1 month(s)  The format for your next appointment:   In Person  Provider:   You may see Nelva Bush, MD or one of the  following Advanced Practice Providers on your designated Care Team:   Murray Hodgkins, NP Christell Faith, PA-C Marrianne Mood, PA-C Cadence Kath[place], [place]   Other Instructions N/a

## 2021-10-06 ENCOUNTER — Ambulatory Visit: Payer: Medicare Other | Admitting: Internal Medicine

## 2021-10-06 ENCOUNTER — Telehealth: Payer: Self-pay

## 2021-10-06 DIAGNOSIS — R7989 Other specified abnormal findings of blood chemistry: Secondary | ICD-10-CM

## 2021-10-06 LAB — BASIC METABOLIC PANEL
BUN/Creatinine Ratio: 18 (ref 10–24)
BUN: 27 mg/dL (ref 8–27)
CO2: 22 mmol/L (ref 20–29)
Calcium: 8.8 mg/dL (ref 8.6–10.2)
Chloride: 97 mmol/L (ref 96–106)
Creatinine, Ser: 1.51 mg/dL — ABNORMAL HIGH (ref 0.76–1.27)
Glucose: 92 mg/dL (ref 70–99)
Potassium: 4.1 mmol/L (ref 3.5–5.2)
Sodium: 139 mmol/L (ref 134–144)
eGFR: 46 mL/min/{1.73_m2} — ABNORMAL LOW (ref >59)

## 2021-10-06 NOTE — Telephone Encounter (Signed)
Patient aware of results. Patient will come in for lab work in 1 week. Orders have been placed.

## 2021-10-06 NOTE — Telephone Encounter (Signed)
-----   Message from Nelva Bush, MD sent at 10/06/2021  1:31 PM EDT ----- Please let Adam Knight know that his creatinine is mildly elevated above his baseline.  His electrolytes are normal.  I recommend that he proceed with increased dose of torsemide 40 mg BID x 3 days, followed by 40 mg daily thereafter.  We should repeat a BMP in ~1 week.

## 2021-10-09 ENCOUNTER — Telehealth: Payer: Self-pay | Admitting: Internal Medicine

## 2021-10-09 NOTE — Telephone Encounter (Signed)
Attempted to call pt. No answer. Lmtcb.  Pt saw Dr. Saunders Revel in office 10/05/21.  Torsemide was incr to 40 mg BID x 3 days at ov.  Pt to resume Torsemide 40 mg daily thereafter.  Pt was instructed to contact the office today if he had not had any weight loss after incr of diuretic.   Will discuss further upon return call.

## 2021-10-09 NOTE — Telephone Encounter (Signed)
Patient states for 3 days he has lost 3.6 pounds since he has started taking extra Torsemide. Please call to discuss.

## 2021-10-11 NOTE — Telephone Encounter (Signed)
Spoke to pt. States he is feeling well.  Has not weighed this morning but as of yesterday had lost 3.6 lbs since Torsemide incr.  Pt will follow up with repeat BMET this Friday.  Pt has no further questions or concerns.

## 2021-10-13 ENCOUNTER — Other Ambulatory Visit: Payer: Medicare Other

## 2021-10-13 ENCOUNTER — Other Ambulatory Visit: Payer: Self-pay | Admitting: Internal Medicine

## 2021-10-13 NOTE — Telephone Encounter (Signed)
Refill Request.  

## 2021-10-16 ENCOUNTER — Other Ambulatory Visit: Payer: Self-pay

## 2021-10-16 ENCOUNTER — Other Ambulatory Visit (INDEPENDENT_AMBULATORY_CARE_PROVIDER_SITE_OTHER): Payer: Medicare Other

## 2021-10-16 DIAGNOSIS — R7989 Other specified abnormal findings of blood chemistry: Secondary | ICD-10-CM | POA: Diagnosis not present

## 2021-10-17 LAB — BASIC METABOLIC PANEL
BUN/Creatinine Ratio: 18 (ref 10–24)
BUN: 26 mg/dL (ref 8–27)
CO2: 25 mmol/L (ref 20–29)
Calcium: 9.2 mg/dL (ref 8.6–10.2)
Chloride: 100 mmol/L (ref 96–106)
Creatinine, Ser: 1.43 mg/dL — ABNORMAL HIGH (ref 0.76–1.27)
Glucose: 108 mg/dL — ABNORMAL HIGH (ref 70–99)
Potassium: 4 mmol/L (ref 3.5–5.2)
Sodium: 141 mmol/L (ref 134–144)
eGFR: 50 mL/min/{1.73_m2} — ABNORMAL LOW (ref >59)

## 2021-10-18 ENCOUNTER — Other Ambulatory Visit: Payer: Self-pay

## 2021-10-18 ENCOUNTER — Ambulatory Visit (INDEPENDENT_AMBULATORY_CARE_PROVIDER_SITE_OTHER): Payer: Medicare Other

## 2021-10-18 DIAGNOSIS — I2602 Saddle embolus of pulmonary artery with acute cor pulmonale: Secondary | ICD-10-CM

## 2021-10-18 DIAGNOSIS — Z5181 Encounter for therapeutic drug level monitoring: Secondary | ICD-10-CM | POA: Diagnosis not present

## 2021-10-18 LAB — POCT INR: INR: 1.7 — AB (ref 2.0–3.0)

## 2021-10-18 NOTE — Patient Instructions (Signed)
-   take extra 1/2 tablet warfarin tonight, then - continue dosage of warfarin of 1 tablet (5 mg) every day - Recheck INR in 4 weeks.

## 2021-10-19 ENCOUNTER — Telehealth: Payer: Self-pay | Admitting: *Deleted

## 2021-10-19 NOTE — Telephone Encounter (Signed)
-----   Message from Nelva Bush, MD sent at 10/17/2021  8:47 PM EDT ----- Please let Mr. Deems know his labs are stable.  He should continue his current medications and follow-up as previously discussed.

## 2021-10-19 NOTE — Telephone Encounter (Signed)
Called pt with results. No answer at this time. Left detailed message on vm (DPR approved) with normal results and Dr. Darnelle Bos recc. Asked pt to call back with any further questions.

## 2021-10-23 ENCOUNTER — Other Ambulatory Visit: Payer: Self-pay | Admitting: Dermatology

## 2021-10-23 DIAGNOSIS — R21 Rash and other nonspecific skin eruption: Secondary | ICD-10-CM

## 2021-11-10 ENCOUNTER — Ambulatory Visit: Payer: Medicare Other | Admitting: Internal Medicine

## 2021-11-15 ENCOUNTER — Ambulatory Visit (INDEPENDENT_AMBULATORY_CARE_PROVIDER_SITE_OTHER): Payer: Medicare Other

## 2021-11-15 ENCOUNTER — Other Ambulatory Visit: Payer: Self-pay

## 2021-11-15 DIAGNOSIS — I2602 Saddle embolus of pulmonary artery with acute cor pulmonale: Secondary | ICD-10-CM | POA: Diagnosis not present

## 2021-11-15 DIAGNOSIS — Z5181 Encounter for therapeutic drug level monitoring: Secondary | ICD-10-CM

## 2021-11-15 LAB — POCT INR: INR: 2.6 (ref 2.0–3.0)

## 2021-11-15 NOTE — Patient Instructions (Signed)
-   continue dosage of warfarin of 1 tablet (5 mg) every day - Recheck INR in 5 weeks.

## 2021-12-16 ENCOUNTER — Other Ambulatory Visit: Payer: Self-pay | Admitting: Dermatology

## 2021-12-16 DIAGNOSIS — R21 Rash and other nonspecific skin eruption: Secondary | ICD-10-CM

## 2021-12-20 ENCOUNTER — Other Ambulatory Visit: Payer: Self-pay

## 2021-12-20 ENCOUNTER — Ambulatory Visit (INDEPENDENT_AMBULATORY_CARE_PROVIDER_SITE_OTHER): Payer: Medicare Other

## 2021-12-20 DIAGNOSIS — I2602 Saddle embolus of pulmonary artery with acute cor pulmonale: Secondary | ICD-10-CM | POA: Diagnosis not present

## 2021-12-20 DIAGNOSIS — Z5181 Encounter for therapeutic drug level monitoring: Secondary | ICD-10-CM | POA: Diagnosis not present

## 2021-12-20 LAB — POCT INR: INR: 3.6 — AB (ref 2.0–3.0)

## 2021-12-20 NOTE — Patient Instructions (Signed)
-   skip warfarin tonight, then  - continue dosage of warfarin of 1 tablet (5 mg) every day - Recheck INR in 5 weeks.

## 2022-01-10 NOTE — Progress Notes (Signed)
Follow-up Outpatient Visit Date: 01/11/2022  Primary Care Provider: Barbaraann Boys, MD [address] [place] [address]  Chief Complaint: Shortness of breath  HPI:  Adam Knight is a 81 y.o. 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  I last saw him in October, at which time he reported continued dyspnea with mild activity.  Preceding MPI was low risk with a small fixed apical inferior and apical lateral defect.  We agreed to increase torsemide to 40 mg BID x 3 days, followed by 40 mg daily.  Follow-up labs by his PCP a few weeks later were notable for rise in creatinine from 1.4 -> 2.0.  Today, Adam Knight feels about the same as at his prior visits.  He is frustrated because he feels like it is hard for him to verbalize his symptoms.  He notes some weight gain over the last few months, which he attributes to his diet rather than fluid retention.  He is concerned about his kidneys.  He denies chest pain, palpitations, and lightheadedness.  Leg edema has been minimal.  He is trying to walk with his wife but finds that it is difficult and that his stamina is not as good as it was after he completed physical therapy last year.  --------------------------------------------------------------------------------------------------  Past Medical History:  Diagnosis Date   Actinic keratosis    Atypical mole 04/18/2020   R temple Atypical Letiginous melanocytic proliferation with regression, clear post biopsy   Basal cell carcinoma 04/18/2020   Right mid upper forehead, MOHs 06/22/20   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[place])    Dyslipidemia    Edema    Gastric ulcer    OSA on CPAP    Prediabetes    Pulmonary embolism ([place])    Seasonal allergies    Past Surgical History:   Procedure Laterality Date   ENDARTERECTOMY  10/2018   Pulmonary endarterectomy for CTEPH - [facility]   PULMONARY VENOGRAPHY Bilateral 01/08/2018   Procedure: PULMONARY VENOGRAPHY possible thrombolysis;  Surgeon: Katha Cabal, MD;  Location: [facility];  Service: Cardiovascular;  Laterality: Bilateral;   TONSILLECTOMY       Recent CV Pertinent Labs: Lab Results  Component Value Date   INR 3.6 (A) 12/20/2021   INR 1.7 (H) 03/13/2019   K 4.0 10/16/2021   BUN 26 10/16/2021   CREATININE 1.43 (H) 10/16/2021    Past medical and surgical history were reviewed and updated in EPIC.  Current Meds  Medication Sig   busPIRone (BUSPAR) 10 MG tablet Take by mouth 2 (two) times daily.   Calcium Carbonate Antacid (CALCIUM CARBONATE, DOSED IN MG ELEMENTAL CALCIUM,) 1250 MG/5ML SUSP Take by mouth daily.   Cholecalciferol (VITAMIN D) 2000 UNITS tablet Take 2,000 Units by mouth daily.   potassium chloride (KLOR-CON) 10 MEQ tablet Take by mouth. 40 MEQ IN THE MORNING AND 30 MEQ AT NIGHT.   torsemide (DEMADEX) 20 MG tablet Take 2 tablets (40 mg total) by mouth daily.   triamcinolone ointment (KENALOG) 0.1 % APPLY TO RASH ON LEFT ANKLE TWICE A DAY UNTIL IMPROVED. AVOID FACE, GROIN, UNDERARMS.   warfarin (COUMADIN) 5 MG tablet TAKE 1 TABLET BY MOUTH DAILY OR AS DIRECTED    Allergies: Sulfa antibiotics, Spironolactone, and Penicillins  Social History   Tobacco Use   Smoking status: Former  Packs/day: 0.30    Years: 7.00    Pack years: 2.10    Types: Cigarettes    Quit date: 79    Years since quitting: 56.0   Smokeless tobacco: Never  Vaping Use   Vaping Use: Never used  Substance Use Topics   Alcohol use: Not Currently   Drug use: No    Family History  Problem Relation Age of Onset   Stroke Mother    Coronary artery disease Mother    Osteoporosis Mother    Rheum arthritis Mother    Thyroid disease Mother     Review of Systems: A 12-system review of systems was  performed and was negative except as noted in the HPI.  --------------------------------------------------------------------------------------------------  Physical Exam: BP 130/80 (BP Location: Left Arm, Patient Position: Sitting, Cuff Size: Large)    Pulse 96    Ht 5\' 2"  (1.575 m)    Wt 232 lb (105.2 kg)    SpO2 96%    BMI 42.43 kg/m   General:  NAD. Neck: No JVD or HJR. Lungs: Clear to auscultation bilaterally without wheezes or crackles. Heart: Regular rate and rhythm without murmurs, rubs, or gallops. Abdomen: Soft, nontender, nondistended. Extremities: Trace to 1+ distal calf/ankle edema bilaterally.   Lab Results  Component Value Date   WBC 7.8 03/28/2020   HGB 14.8 03/28/2020   HCT 44.2 03/28/2020   MCV 86.0 03/28/2020   PLT 411 (H) 03/28/2020    Lab Results  Component Value Date   NA 141 10/16/2021   K 4.0 10/16/2021   CL 100 10/16/2021   CO2 25 10/16/2021   BUN 26 10/16/2021   CREATININE 1.43 (H) 10/16/2021   GLUCOSE 108 (H) 10/16/2021   ALT 24 03/09/2019    No results found for: CHOL, HDL, LDLCALC, LDLDIRECT, TRIG, CHOLHDL  --------------------------------------------------------------------------------------------------  ASSESSMENT AND PLAN: CTEPH and dyspnea on exetion: Adam Knight continues to complain of fatigue and exertional dyspnea.  He has a hard time articulating the trajectory of his symptoms, though overall he feels a little worse than a year ago.  He continues to have mild leg edema on exam today but no JVD.  Given bump in creatinine after brief escalation of torsemide at our last visit, I am reluctant to increase his diuretic regimen again.  We will recheck a BMP today.  We discussed referral to PT to help with deconditioning versus right heart catheterization to objectively assess his volume status.  He wishes to try PT first and defer RHC.  He is scheduled to see his [facility] specialist at [facility], Dr. Marijean Bravo, in March.  RHC should be readdressed at that  time.  Continue indefinite anticoagulation with warfarin, followed in our anticoagulation clinic.  Deconditioning: This is likely contributing to some of Mr. Plath's fatigue and exertional dyspnea.  I will refer him to physical therapy for further evaluation and treatment.  Of note, stress test and echo last summer did not show evidence of cardiomyopathy or significant ischemic heart disease to explain his symptoms.  Follow-up: Return to clinic in 4 months.  Nelva Bush, MD 01/11/2022 8:09 PM

## 2022-01-11 ENCOUNTER — Encounter: Payer: Self-pay | Admitting: Internal Medicine

## 2022-01-11 ENCOUNTER — Ambulatory Visit (INDEPENDENT_AMBULATORY_CARE_PROVIDER_SITE_OTHER): Payer: Medicare Other | Admitting: Internal Medicine

## 2022-01-11 ENCOUNTER — Other Ambulatory Visit: Payer: Self-pay

## 2022-01-11 VITALS — BP 130/80 | HR 96 | Ht 62.0 in | Wt 232.0 lb

## 2022-01-11 DIAGNOSIS — I5033 Acute on chronic diastolic (congestive) heart failure: Secondary | ICD-10-CM | POA: Diagnosis not present

## 2022-01-11 DIAGNOSIS — I2724 Chronic thromboembolic pulmonary hypertension: Secondary | ICD-10-CM | POA: Diagnosis not present

## 2022-01-11 DIAGNOSIS — R5381 Other malaise: Secondary | ICD-10-CM

## 2022-01-11 DIAGNOSIS — R0609 Other forms of dyspnea: Secondary | ICD-10-CM | POA: Diagnosis not present

## 2022-01-11 NOTE — Patient Instructions (Addendum)
Medication Instructions:  - Your physician recommends that you continue on your current medications as directed. Please refer to the Current Medication list given to you today.  *If you need a refill on your cardiac medications before your next appointment, please call your pharmacy*   Lab Work: - Your physician recommends that you have lab work today: BMP   If you have labs (blood work) drawn today and your tests are completely normal, you will receive your results only by: MyChart Message (if you have MyChart) OR A paper copy in the mail If you have any lab test that is abnormal or we need to change your treatment, we will call you to review the results.   Testing/Procedures: - You have been referred to : Physical Therapy at [facility] (deconditioning). The Physical Therapy department should reach out to you directly to schedule an appointment, but if you do not hear from them in the next 2 weeks, please call us back at that office at [phone] so we may follow up on this for you.     Follow-Up: At [facility], you and your health needs are our priority.  As part of our continuing mission to provide you with exceptional heart care, we have created designated Provider Care Teams.  These Care Teams include your primary Cardiologist (physician) and Advanced Practice Providers (APPs -  Physician Assistants and Nurse Practitioners) who all work together to provide you with the care you need, when you need it.  We recommend signing up for the patient portal called "MyChart".  Sign up information is provided on this After Visit Summary.  MyChart is used to connect with patients for Virtual Visits (Telemedicine).  Patients are able to view lab/test results, encounter notes, upcoming appointments, etc.  Non-urgent messages can be sent to your provider as well.   To learn more about what you can do with MyChart, go to [email or web address].    Your next appointment:   4 month(s)  The format  for your next appointment:   In Person  Provider:   You may see Nelva Bush, MD or one of the following Advanced Practice Providers on your designated Care Team:   Murray Hodgkins, NP Christell Faith, PA-C Cadence Kathlen Mody, [place]    Other Instructions N/a

## 2022-01-12 LAB — BASIC METABOLIC PANEL
BUN/Creatinine Ratio: 15 (ref 10–24)
BUN: 25 mg/dL (ref 8–27)
CO2: 20 mmol/L (ref 20–29)
Calcium: 8.9 mg/dL (ref 8.6–10.2)
Chloride: 102 mmol/L (ref 96–106)
Creatinine, Ser: 1.69 mg/dL — ABNORMAL HIGH (ref 0.76–1.27)
Glucose: 112 mg/dL — ABNORMAL HIGH (ref 70–99)
Potassium: 3.6 mmol/L (ref 3.5–5.2)
Sodium: 145 mmol/L — ABNORMAL HIGH (ref 134–144)
eGFR: 41 mL/min/{1.73_m2} — ABNORMAL LOW (ref >59)

## 2022-01-16 ENCOUNTER — Other Ambulatory Visit: Payer: Self-pay

## 2022-01-16 ENCOUNTER — Ambulatory Visit (INDEPENDENT_AMBULATORY_CARE_PROVIDER_SITE_OTHER): Payer: Medicare Other | Admitting: Dermatology

## 2022-01-16 DIAGNOSIS — L570 Actinic keratosis: Secondary | ICD-10-CM

## 2022-01-16 DIAGNOSIS — Z85828 Personal history of other malignant neoplasm of skin: Secondary | ICD-10-CM

## 2022-01-16 DIAGNOSIS — L578 Other skin changes due to chronic exposure to nonionizing radiation: Secondary | ICD-10-CM

## 2022-01-16 DIAGNOSIS — L810 Postinflammatory hyperpigmentation: Secondary | ICD-10-CM | POA: Diagnosis not present

## 2022-01-16 DIAGNOSIS — L409 Psoriasis, unspecified: Secondary | ICD-10-CM

## 2022-01-16 DIAGNOSIS — L820 Inflamed seborrheic keratosis: Secondary | ICD-10-CM

## 2022-01-16 DIAGNOSIS — Z86018 Personal history of other benign neoplasm: Secondary | ICD-10-CM

## 2022-01-16 NOTE — Progress Notes (Signed)
Follow-Up Visit   Subjective  Adam Knight is a 81 y.o. male who presents for the following: Follow-up.  Patient here for 6 month follow-up Aks. He has a spot on his right forehead that is growing and is bothersome. 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 He has a history of BCC of the R mid upper forehead and hx of atypical of the right temple.   The following portions of the chart were reviewed this encounter and updated as appropriate:       Review of Systems:  No other skin or systemic complaints except as noted in HPI or Assessment and Plan.  Objective  Well appearing patient in no apparent distress; mood and affect are within normal limits.  A focused examination was performed including face, ankle. Relevant physical exam findings are noted in the Assessment and Plan.  R temple x 1 Erythematous stuck-on, waxy papule  L glabella x 2, R cheek x 1, L lat eye x 1 (4) Pink scaly macules.   L medial ankle, L lateral ankle Yellow/brown hyperpigmented patch of the left medial ankle; violaceous scaly papule of the left lat ankle     Assessment & Plan  Actinic Damage - chronic, secondary to cumulative UV radiation exposure/sun exposure over time - diffuse scaly erythematous macules with underlying dyspigmentation - Recommend daily broad spectrum sunscreen SPF 30+ to sun-exposed areas, reapply every 2 hours as needed.  - Recommend staying in the shade or wearing long sleeves, sun glasses (UVA+UVB protection) and wide brim hats (4-inch brim around the entire circumference of the hat). - Call for new or changing lesions.  History of Basal Cell Carcinoma of the Skin - No evidence of recurrence today of the R mid upper forehead - Recommend regular full body skin exams - Recommend daily broad spectrum sunscreen SPF 30+ to sun-exposed areas, reapply every 2 hours as needed.  - Call if any new or changing  lesions are noted between office visits  History of Atypical Nevus R temple - No evidence of recurrence today, clear with biopsy - Recommend regular full body skin exams - Recommend daily broad spectrum sunscreen SPF 30+ to sun-exposed areas, reapply every 2 hours as needed.  - Call if any new or changing lesions are noted between office visits   Inflamed seborrheic keratosis R temple x 1  Destruction of lesion - R temple x 1  Destruction method: cryotherapy   Informed consent: discussed and consent obtained   Lesion destroyed using liquid nitrogen: Yes   Region frozen until ice ball extended beyond lesion: Yes   Outcome: patient tolerated procedure well with no complications   Post-procedure details: wound care instructions given   Additional details:  Prior to procedure, discussed risks of blister formation, small wound, skin dyspigmentation, or rare scar following cryotherapy. Recommend Vaseline ointment to treated areas while healing.   AK (actinic keratosis) (4) L glabella x 2, R cheek x 1, L lat eye x 1  Actinic keratoses are precancerous spots that appear secondary to cumulative UV radiation exposure/sun exposure over time. They are chronic with expected duration over 1 year. A portion of actinic keratoses will progress to squamous cell carcinoma of the skin. It is not possible to reliably predict which spots will progress to skin cancer and so treatment is recommended to prevent development of skin cancer.  Recommend daily broad spectrum sunscreen SPF 30+ to sun-exposed areas, reapply every 2  hours as needed.  Recommend staying in the shade or wearing long sleeves, sun glasses (UVA+UVB protection) and wide brim hats (4-inch brim around the entire circumference of the hat). Call for new or changing lesions.  Destruction of lesion - L glabella x 2, R cheek x 1, L lat eye x 1  Destruction method: cryotherapy   Informed consent: discussed and consent obtained   Lesion destroyed  using liquid nitrogen: Yes   Region frozen until ice ball extended beyond lesion: Yes   Outcome: patient tolerated procedure well with no complications   Post-procedure details: wound care instructions given   Additional details:  Prior to procedure, discussed risks of blister formation, small wound, skin dyspigmentation, or rare scar following cryotherapy. Recommend Vaseline ointment to treated areas while healing.   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  L medial ankle improved.  Continue TMC 0.1% ankle to L lat ankle qd/bid until improved. May use prn to both spots for flares.     Return in about 6 months (around 07/16/2022) for UBSE, Hx atypical nevus, Hx BCC, Hx AKs.  IJamesetta Orleans, CMA, am acting as scribe for Brendolyn Patty, MD . Documentation: I have reviewed the above documentation for accuracy and completeness, and I agree with the above.  Brendolyn Patty MD

## 2022-01-16 NOTE — Patient Instructions (Addendum)

## 2022-01-24 ENCOUNTER — Ambulatory Visit (INDEPENDENT_AMBULATORY_CARE_PROVIDER_SITE_OTHER): Payer: Medicare Other

## 2022-01-24 ENCOUNTER — Other Ambulatory Visit: Payer: Self-pay

## 2022-01-24 DIAGNOSIS — Z5181 Encounter for therapeutic drug level monitoring: Secondary | ICD-10-CM | POA: Diagnosis not present

## 2022-01-24 DIAGNOSIS — I2602 Saddle embolus of pulmonary artery with acute cor pulmonale: Secondary | ICD-10-CM

## 2022-01-24 LAB — POCT INR: INR: 3.1 — AB (ref 2.0–3.0)

## 2022-01-24 NOTE — Patient Instructions (Signed)
-   START NEW dosage of warfarin of 1 tablet (5 mg) every day EXCEPT 1/2 tablet on MONDAYS & FRIDAYS - Recheck INR in 3 weeks

## 2022-01-25 ENCOUNTER — Ambulatory Visit: Payer: Medicare Other

## 2022-02-02 ENCOUNTER — Other Ambulatory Visit: Payer: Self-pay | Admitting: Internal Medicine

## 2022-02-02 NOTE — Telephone Encounter (Signed)
Refill request

## 2022-02-14 ENCOUNTER — Other Ambulatory Visit: Payer: Self-pay

## 2022-02-14 ENCOUNTER — Ambulatory Visit (INDEPENDENT_AMBULATORY_CARE_PROVIDER_SITE_OTHER): Payer: Medicare Other

## 2022-02-14 DIAGNOSIS — Z5181 Encounter for therapeutic drug level monitoring: Secondary | ICD-10-CM | POA: Diagnosis not present

## 2022-02-14 DIAGNOSIS — I2602 Saddle embolus of pulmonary artery with acute cor pulmonale: Secondary | ICD-10-CM | POA: Diagnosis not present

## 2022-02-14 LAB — POCT INR: INR: 3.6 — AB (ref 2.0–3.0)

## 2022-02-14 NOTE — Patient Instructions (Signed)
-   skip warfarin tonight, then - resume dosage of warfarin of 1 tablet (5 mg) every day EXCEPT 1/2 tablet on MONDAYS & FRIDAYS - Recheck INR in 3 weeks

## 2022-02-15 ENCOUNTER — Ambulatory Visit: Payer: Medicare Other | Attending: Internal Medicine

## 2022-02-15 DIAGNOSIS — M6281 Muscle weakness (generalized): Secondary | ICD-10-CM | POA: Diagnosis present

## 2022-02-15 DIAGNOSIS — R5381 Other malaise: Secondary | ICD-10-CM | POA: Diagnosis not present

## 2022-02-15 DIAGNOSIS — R2689 Other abnormalities of gait and mobility: Secondary | ICD-10-CM | POA: Insufficient documentation

## 2022-02-15 DIAGNOSIS — R2681 Unsteadiness on feet: Secondary | ICD-10-CM | POA: Diagnosis present

## 2022-02-15 DIAGNOSIS — R278 Other lack of coordination: Secondary | ICD-10-CM | POA: Diagnosis present

## 2022-02-15 NOTE — Therapy (Deleted)
[place] MAIN [facility] SERVICES [address] [place], [place], [address] Phone: [phone]   Fax:  [phone]  Physical Therapy Evaluation  Patient Details  Name: Adam Knight MRN: [id number] Date of Birth: June 21, 1941 Referring Provider (PT): End, Harrell Gave, MD   Encounter Date: 02/15/2022   PT End of Session - 02/15/22 1511     Visit Number 1    Number of Visits 25    Date for PT Re-Evaluation 05/10/22    Authorization Time Period 02/15/2022-05/10/2022    PT Start Time 0809    PT Stop Time 0900    PT Time Calculation (min) 51 min    Equipment Utilized During Treatment Gait belt    Activity Tolerance Patient tolerated treatment well    Behavior During Therapy [facility] for tasks assessed/performed             Past Medical History:  Diagnosis Date   Actinic keratosis    Atypical mole 04/18/2020   R temple Atypical Letiginous melanocytic proliferation with regression, clear post biopsy   Basal cell carcinoma 04/18/2020   Right mid upper forehead, MOHs 06/22/20   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    Subjective Assessment - 02/15/22 0810     Subjective Pt presents to PT with reports of deconditioning/decreased activity tolerance, balance and strength. Pt reports he noticed a decline 3 years ago following surgical procedure to remove clots from his lungs. Pt did attend pulmonary  rehab following procedure but continues to feel somewhat short of breath with activities. Pt reports he has noticed his gait speed has slowed and that his UEs and LEs are weaker. He does report lymphedema in LLE. He states I have two bad knees, but denies pain. He reports unsteadiness with gait and FOF with ascending/descending curbs. Pt endorses difficulty standing up from the floor. The pt reports no falls in past 6 months. He does not use an AD.    Pertinent History Pt presents to PT with reports of deconditioning/decreased activity tolerance, balance and strength. Pt reports he noticed a decline 3 years ago following surgical procedure to remove clots from his lungs. Pt did attend pulmonary rehab following procedure but continues to feel somewhat short of breath with activities. Pt reports he has noticed his gait speed has slowed and that his UEs and LEs are weaker. He does report lymphedema in LLE. He states I have two bad knees, but denies pain. He reports unsteadiness with gait and FOF with ascending/descending curbs. Pt endorses difficulty standing up from the floor. The pt reports no falls in past 6 months. He does not use an AD. Other PMH significant for the following: hx of surgical procedure to remove clots in lungs (per pt report 3 years ago), PE, OSA, lymphedema, chronic thromboembolic pulmonary hypertension, DOE, PVD.    Limitations Sitting;Walking;Lifting;House hold activities;Standing    How long can  you sit comfortably? Pt reports interrupted after an hour due to needing to use restroom (on lasix)    How long can you stand comfortably? <15 minutes due to fatigue    How long can you walk comfortably? Pt reports he can walk 20-30 minutes without stopping, but slowly    Diagnostic tests none recent    Patient Stated Goals Pt would like to improve his balance and his walking ability    Currently in Pain? No/denies            EXAMINATION:  PAIN: none  POSTURE: flattening of  lordotic curves, kyphotic, rounded shoulders.    PROM/AROM:  Pt observed to have impaired hip extension with gait. When cued to attempt standing hip extension at support bar, pt unable to perform without forward lean/compensatory movement. Appears to not make it past neutral.   STRENGTH:  Graded on a 0-5 scale  Pt LE strength grossly 4/5 B  SENSATION: Intact to light touch in BLEs.  Coordination: WNL in LEs and UEs    FUNCTIONAL MOBILITY:   BALANCE: Impaired (see below, SLB)  GAIT:  Pt with impaired gait speed (see 10MWT), decreased B heel strike, decreased trunk rotation and decreased step-length B. Pt exhibits R shift, and R hip drop.   OUTCOME MEASURES: TEST Outcome Interpretation  5 times sit<>stand 14.5 sec >60 yo, >15 sec indicates increased risk for falls; *however, pt having to catch breath following test  10 Knight walk test      0.8 m/s <1.0 m/s indicates increased risk for falls; limited community ambulator; pt gait appears antalgic, however, pt reports no pain but does report swelling in LLE affecting gait  6 minute walk test      720 Feet 1000 feet is community ambulator; pt must catch breath and rest after test  Berg Balance Assessment Deferred d/t time <36/56 (100% risk for falls), 37-45 (80% risk for falls); 46-51 (>50% risk for falls); 52-55 (lower risk <25% of falls)  SLB <1 sec each LE Unable to maintain balance, must use toe-touch or UE support on bar      Pt educated throughout session about proper posture and technique with exercises. Improved exercise technique, movement at target joints, use of target muscles after min to mod verbal, visual, tactile cues.     [facility] PT Assessment - 02/15/22 0816       Assessment   Referring Provider (PT) End, Harrell Gave, MD    Next MD Visit pt unsure    Prior Therapy Pt therapy in the past      Balance Screen   Has the patient fallen in the past 6 months No      [place]  residence    Living Arrangements Spouse/significant other    Available Help at Discharge Family    Type of [place] to enter    Entrance Stairs-Number of Steps 3    Entrance Stairs-Rails Right    [place] Two level   stays on first floor, has bathroom, bedroom   Alternate Level Stairs-Number of Steps 15    Alternate [place] - single point;Grab bars - tub/shower;Toilet riser;Walker - 4 wheels   pt reports he made [facility]     Prior Function   Level of Independence Independent              Plan - 02/15/22 2440  Clinical Impression Statement The pt is a pleasant 81 y/o male referred to PT due to deconditioning, weakness. PT exam reveals the following deficits: decreased BLE strength, impaired LE ROM, impaired gait speed/ability, impaired functional capacity and balance. Berg and formation of HEP deferred to next 1-2 sessions due to time limitations (pt late arrival to appointment). The pt will benefit from further skilled PT to improve gait, strength, balance and mobility to increase ease and safety with ADLs.    Personal Factors and Comorbidities Age;Comorbidity 3+    Comorbidities hx of surgical procedure to remove clots in lungs, PE, OSA, lymphedema,  (chronic thromboembolic pulmonary hypertension, DOE, PVD    Examination-Activity Limitations Bend;Lift;Locomotion Level;Reach Overhead;Sit;Squat;Stairs;Stand;Transfers    Examination-Participation Restrictions Community Activity;Yard Work;Shop;Cleaning    Stability/Clinical Decision Making Stable/Uncomplicated    Clinical Decision Making Low    Rehab Potential Good    PT Frequency 2x / week    PT Duration 12 weeks    PT Treatment/Interventions ADLs/Self Care Home Management;Aquatic Therapy;Canalith Repostioning;Biofeedback;Cryotherapy;Electrical Stimulation;Iontophoresis 4mg /ml Dexamethasone;Moist Heat;Traction;Parrafin;Contrast Bath;DME Instruction;Gait training;Stair  training;Functional mobility training;Therapeutic activities;Therapeutic exercise;Balance training;Neuromuscular re-education;Patient/family education;Orthotic Fit/Training;Manual techniques;Wheelchair mobility training;Passive range of motion;Energy conservation;Splinting;Taping;Vestibular;Vasopneumatic Device;Visual/perceptual remediation/compensation;Spinal Manipulations;Joint Manipulations    PT Next Visit Plan BERG, issue HEP, strength, balance, endurance    PT Home Exercise Plan to be issued next 1-2 sessions    Consulted and Agree with Plan of Care Patient             Patient will benefit from skilled therapeutic intervention in order to improve the following deficits and impairments:  Abnormal gait, Decreased activity tolerance, Decreased endurance, Decreased range of motion, Decreased strength, Hypomobility, Improper body mechanics, Decreased balance, Decreased mobility, Difficulty walking, Increased edema, Impaired flexibility, Postural dysfunction  Visit Diagnosis: Other abnormalities of gait and mobility  Muscle weakness (generalized)  Unsteadiness on feet     Problem List 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   Mild cognitive impairment 05/08/2018   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[place]) 05/02/2018   Chest pain 05/02/2018   Palpitations 05/02/2018   Long term (current) use of anticoagulants 02/18/2018   Pulmonary embolus ([place])  01/06/2018   Primary osteoarthritis of both knees 11/10/2017   Lymphedema of both lower extremities 10/25/2017   Hyperlipidemia, mixed 06/01/2016   Mild depression 06/01/2016   Dyspnea on exertion 03/01/2016   Non morbid obesity due to excess calories 03/01/2016   PVD (peripheral vascular disease) ([place]) 03/01/2016   Primary osteoarthritis of left knee 04/20/2015   Primary osteoarthritis of right knee 04/20/2015   BMI 40.0-44.9, adult ([place]) 04/20/2015   DOE (dyspnea on exertion) 01/26/2015   OSA on CPAP 01/26/2015   Morbid obesity ([place]) 01/26/2015   Chronic edema 01/13/2015   Hyperglycemia, unspecified 01/13/2015   Nephrolithiasis 01/13/2015   Obstructive sleep apnea 05/08/2013    Zollie Pee, PT 02/15/2022, 3:24 PM  [place] MA[place] [facility] SERVICES [address] [place], [place], [address] Phone: [phone]   Fax:  [phone]  Name: Jakob Kimberlin  MRN: [id number] Date of Birth: Mar 05, 1941

## 2022-02-15 NOTE — Therapy (Signed)
[place] MAIN [facility] SERVICES [address] [place], [place], [address] Phone: [phone]   Fax:  [phone]  Physical Therapy Evaluation  Patient Details  Name: Adam Knight MRN: [id number] Date of Birth: [birth year] Referring Provider (PT): Knight, Adam Gave, MD   Encounter Date: 02/15/2022   PT Knight of Session - 02/15/22 1511     Visit Number 1    Number of Visits 25    Date for PT Re-Evaluation 05/10/22    Authorization Time Period 02/15/2022-05/10/2022    PT Start Time 0809    PT Stop Time 0900    PT Time Calculation (min) 51 min    Equipment Utilized During Treatment Gait belt    Activity Tolerance Patient tolerated treatment well    Behavior During Therapy [facility] for tasks assessed/performed             Past Medical History:  Diagnosis Date   Actinic keratosis    Atypical mole 04/18/2020   R temple Atypical Letiginous melanocytic proliferation with regression, clear post biopsy   Basal cell carcinoma 04/18/2020   Right mid upper forehead, MOHs 06/22/20   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    Subjective Assessment - 02/15/22 0810     Subjective Pt presents to PT with reports of deconditioning/decreased activity tolerance, balance and strength. Pt reports he noticed a decline 3 years ago following surgical procedure to remove clots from his lungs. Pt did attend pulmonary  rehab following procedure but continues to feel somewhat short of breath with activities. Pt reports he has noticed his gait speed has slowed and that his UEs and LEs are weaker. He does report lymphedema in LLE. He states I have two bad knees, but denies pain. He reports unsteadiness with gait and FOF with ascending/descending curbs. Pt endorses difficulty standing up from the floor. The pt reports no falls in past 6 months. He does not use an AD.    Pertinent History Pt presents to PT with reports of deconditioning/decreased activity tolerance, balance and strength. Pt reports he noticed a decline 3 years ago following surgical procedure to remove clots from his lungs. Pt did attend pulmonary rehab following procedure but continues to feel somewhat short of breath with activities. Pt reports he has noticed his gait speed has slowed and that his UEs and LEs are weaker. He does report lymphedema in LLE. He states I have two bad knees, but denies pain. He reports unsteadiness with gait and FOF with ascending/descending curbs. Pt endorses difficulty standing up from the floor. The pt reports no falls in past 6 months. He does not use an AD. Other PMH significant for the following: hx of surgical procedure to remove clots in lungs (per pt report 3 years ago), PE, OSA, lymphedema, chronic thromboembolic pulmonary hypertension, DOE, PVD.    Limitations Sitting;Walking;Lifting;House hold activities;Standing    How long can  you sit comfortably? Pt reports interrupted after an hour due to needing to use restroom (on lasix)    How long can you stand comfortably? <15 minutes due to fatigue    How long can you walk comfortably? Pt reports he can walk 20-30 minutes without stopping, but slowly    Diagnostic tests none recent    Patient Stated Goals Pt would like to improve his balance and his walking ability    Currently in Pain? No/denies                [facility] PT Assessment - 02/15/22 0816        Assessment   Medical Diagnosis deconditioning    Referring Provider (PT) Knight, Adam Gave, MD    Onset Date/Surgical Date --   3 years ago per pt   Next MD Visit pt unsure    Prior Therapy Pt therapy in the past      Precautions   Precautions Fall      Restrictions   Weight Bearing Restrictions No      Balance Screen   Has the patient fallen in the past 6 months No      [place] residence    Living Arrangements Spouse/significant other    Available Help at Discharge Family    Type of [place] to enter    Entrance Stairs-Number of Steps 3    Entrance Stairs-Rails Right    [place] Two level   stays on first floor, has bathroom, bedroom   Alternate Level Stairs-Number of Steps 15    Alternate [place] - single point;Grab bars - tub/shower;Toilet riser;Walker - 4 wheels   pt reports he made SPCs     Prior Function   Level of Independence Independent              EXAMINATION:  PAIN: none  POSTURE: flattening of lordotic curves, kyphotic, rounded shoulders.    PROM/AROM:  Pt observed to have impaired hip extension with gait. When cued to attempt standing hip extension at support bar, pt unable to perform without forward lean/compensatory movement. Appears to not make it past neutral.   STRENGTH:  Graded on a 0-5 scale  Pt LE strength grossly 4/5 B  SENSATION: Intact to light touch in BLEs.  Coordination: WNL in LEs and UEs    FUNCTIONAL MOBILITY:   BALANCE: Impaired (see below, SLB)  GAIT:  Pt with impaired gait speed (see 10MWT), decreased B heel strike, decreased trunk rotation and decreased step-length B. Pt exhibits R shift, and R hip drop.   OUTCOME MEASURES: TEST Outcome Interpretation  5 times sit<>stand 14.5 sec >60 yo, >15 sec indicates increased risk for falls; *however, pt having to catch breath following test  10 meter walk test      0.8 m/s  <1.0 m/s indicates increased risk for falls; limited community ambulator; pt gait appears antalgic, however, pt reports no pain but does report swelling in LLE affecting gait  6 minute walk test      720 Feet 1000 feet is community ambulator; pt must catch breath and rest after test  Berg Balance Assessment Deferred d/t time <36/56 (100% risk for falls), 37-45 (80% risk for falls); 46-51 (>50% risk for falls); 52-55 (lower risk <25% of falls)  SLB <1 sec each LE Unable to maintain balance, must use toe-touch or UE support on bar  Pt educated throughout session about proper posture and technique with exercises. Improved exercise technique, movement at target joints, use of target muscles after min to mod verbal, visual, tactile cues.       PT Education - 02/15/22 1511     Education Details exam findings, indications for PT/POC    Person(s) Educated Patient    Methods Explanation    Comprehension Verbalized understanding              PT Short Term Goals - 02/15/22 0830       PT SHORT TERM GOAL #1   Title Patient will be independent in home exercise program to improve strength/mobility for better functional independence with ADLs.    Baseline 2/23: to be issued next 1-2 sessions    Time 6    Period Weeks    Status New    Target Date 03/29/22               PT Long Term Goals - 02/15/22 0828       PT LONG TERM GOAL #1   Title Patient will increase FOTO score to equal to or greater than 76  to demonstrate statistically significant improvement in mobility and quality of life.    Baseline 2/23: 75    Time 12    Period Weeks    Status New    Target Date 05/10/22      PT LONG TERM GOAL #2   Title Patient will increase six minute walk test distance to >1000 for progression to community ambulator and improve gait ability    Baseline 2/23: 720 ft    Time 12    Period Weeks    Status New    Target Date 05/10/22      PT LONG TERM GOAL #3   Title Patient will  increase 10 meter walk test to >1.89m/s as to improve gait speed for better community ambulation and to reduce fall risk.    Baseline 2/23: 0.8 m/s    Time 12    Period Weeks    Status New    Target Date 05/10/22      PT LONG TERM GOAL #4   Title Patient will tolerate 5 seconds of single leg stance without loss of balance to improve ability to get in and out of shower safely.    Baseline 2/23: unable to maintain 1 sec on each LE    Time 12    Period Weeks    Status New    Target Date 05/10/22      PT LONG TERM GOAL #5   Title Patient will increase Berg Balance score by > 6 points to demonstrate decreased fall risk during functional activities.    Baseline 2/23: deferred to future date d/t time    Time 12    Period Weeks    Status New    Target Date 05/10/22                    Plan - 02/15/22 0831     Clinical Impression Statement The pt is a pleasant 81 y/o male referred to PT due to deconditioning, weakness. PT exam reveals the following deficits: decreased BLE strength, impaired LE ROM, impaired gait speed/ability, impaired functional capacity and balance. Berg and formation of HEP deferred to next 1-2 sessions due to time limitations (pt late arrival to appointment). The pt will benefit from further skilled PT to improve gait, strength, balance and mobility to increase ease and  safety with ADLs.    Personal Factors and Comorbidities Age;Comorbidity 3+    Comorbidities hx of surgical procedure to remove clots in lungs, PE, OSA, lymphedema,  (chronic thromboembolic pulmonary hypertension, DOE, PVD    Examination-Activity Limitations Bend;Lift;Locomotion Level;Reach Overhead;Sit;Squat;Stairs;Stand;Transfers    Examination-Participation Restrictions Community Activity;Yard Work;Shop;Cleaning    Stability/Clinical Decision Making Stable/Uncomplicated    Clinical Decision Making Low    Rehab Potential Good    PT Frequency 2x / week    PT Duration 12 weeks    PT  Treatment/Interventions ADLs/Self Care Home Management;Aquatic Therapy;Canalith Repostioning;Biofeedback;Cryotherapy;Electrical Stimulation;Iontophoresis 4mg /ml Dexamethasone;Moist Heat;Traction;Parrafin;Contrast Bath;DME Instruction;Gait training;Stair training;Functional mobility training;Therapeutic activities;Therapeutic exercise;Balance training;Neuromuscular re-education;Patient/family education;Orthotic Fit/Training;Manual techniques;Wheelchair mobility training;Passive range of motion;Energy conservation;Splinting;Taping;Vestibular;Vasopneumatic Device;Visual/perceptual remediation/compensation;Spinal Manipulations;Joint Manipulations    PT Next Visit Plan BERG, issue HEP, strength, balance, endurance    PT Home Exercise Plan to be issued next 1-2 sessions    Consulted and Agree with Plan of Care Patient             Patient will benefit from skilled therapeutic intervention in order to improve the following deficits and impairments:  Abnormal gait, Decreased activity tolerance, Decreased endurance, Decreased range of motion, Decreased strength, Hypomobility, Improper body mechanics, Decreased balance, Decreased mobility, Difficulty walking, Increased edema, Impaired flexibility, Postural dysfunction  Visit Diagnosis: Other abnormalities of gait and mobility  Muscle weakness (generalized)  Unsteadiness on feet     Problem List Patient Active Problem List   Diagnosis Date Noted   Pain in both lower extremities 08/10/2020   Personal history of other malignant neoplasm of skin 06/22/2020   Left leg swelling 02/03/2020   Anticoagulated on Coumadin 08/20/2019   Chronic venous insufficiency 08/20/2019   Aspirin intolerance 06/02/2019   Chronic renal insufficiency, stage 3 (moderate) (HCC) 05/19/2019   Physical deconditioning 11/10/2018   Decreased strength, endurance, and mobility 11/10/2018   Decreased activities of daily living (ADL) 10/26/2018   Vision loss of right eye  06/05/2018   Mild cognitive impairment 05/08/2018   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[place]) 05/02/2018   Chest pain 05/02/2018   Palpitations 05/02/2018   Long term (current) use of anticoagulants 02/18/2018   Pulmonary embolus ([place]) 01/06/2018   Primary osteoarthritis of both knees 11/10/2017   Lymphedema of both lower extremities 10/25/2017   Hyperlipidemia, mixed 06/01/2016   Mild depression 06/01/2016   Dyspnea on exertion 03/01/2016   Non morbid obesity due to excess calories 03/01/2016   PVD (peripheral vascular disease) ([place]) 03/01/2016   Primary osteoarthritis of left knee 04/20/2015   Primary osteoarthritis of right knee 04/20/2015   BMI 40.0-44.9, adult ([place]) 04/20/2015   DOE (dyspnea on exertion) 01/26/2015   OSA on CPAP 01/26/2015   Morbid obesity ([place]) 01/26/2015   Chronic edema 01/13/2015   Hyperglycemia, unspecified 01/13/2015   Nephrolithiasis 01/13/2015   Obstructive sleep apnea 05/08/2013    Zollie Pee, PT 02/15/2022, 5:12 PM  [place] MAIN [facility] SERVICES [address] [place], [place], [address] Phone: [phone]   Fax:  [phone]  Name: Adam Knight MRN: [id number] Date of Birth: Sep 20, 1941

## 2022-02-20 ENCOUNTER — Ambulatory Visit: Payer: Medicare Other

## 2022-02-20 ENCOUNTER — Other Ambulatory Visit: Payer: Self-pay

## 2022-02-20 DIAGNOSIS — R2689 Other abnormalities of gait and mobility: Secondary | ICD-10-CM

## 2022-02-20 DIAGNOSIS — R278 Other lack of coordination: Secondary | ICD-10-CM

## 2022-02-20 DIAGNOSIS — M6281 Muscle weakness (generalized): Secondary | ICD-10-CM

## 2022-02-20 DIAGNOSIS — R2681 Unsteadiness on feet: Secondary | ICD-10-CM

## 2022-02-20 NOTE — Therapy (Signed)
[place] MAIN [facility] SERVICES [address] [place], [place], [address] Phone: [phone]   Fax:  [phone]  Physical Therapy Treatment  Patient Details  Name: Adam Knight MRN: [id number] Date of Birth: 09/13/1941 Referring Provider (PT): End, Harrell Gave, MD   Encounter Date: 02/20/2022   PT End of Session - 02/20/22 1236     Visit Number 2    Number of Visits 25    Date for PT Re-Evaluation 05/10/22    Authorization Time Period 02/15/2022-05/10/2022    PT Start Time 0852    PT Stop Time 0931    PT Time Calculation (min) 39 min    Equipment Utilized During Treatment Gait belt    Activity Tolerance Patient tolerated treatment well    Behavior During Therapy [facility] for tasks assessed/performed             Past Medical History:  Diagnosis Date   Actinic keratosis    Atypical mole 04/18/2020   R temple Atypical Letiginous melanocytic proliferation with regression, clear post biopsy   Basal cell carcinoma 04/18/2020   Right mid upper forehead, MOHs 06/22/20   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   Subjective Assessment - 02/20/22 0853     Subjective Pt reports no changes since last session. Pt reports no pain. pt reports no falls, stumbles.    Pertinent History Pt presents to PT with reports of deconditioning/decreased activity tolerance, balance and strength. Pt reports he  noticed a decline 3 years ago following surgical procedure to remove clots from his lungs. Pt did attend pulmonary rehab following procedure but continues to feel somewhat short of breath with activities. Pt reports he has noticed his gait speed has slowed and that his UEs and LEs are weaker. He does report lymphedema in LLE. He states I have two bad knees, but denies pain. He reports unsteadiness with gait and FOF with ascending/descending curbs. Pt endorses difficulty standing up from the floor. The pt reports no falls in past 6 months. He does not use an AD. Other PMH significant for the following: hx of surgical procedure to remove clots in lungs (per pt report 3 years ago), PE, OSA, lymphedema, chronic thromboembolic pulmonary hypertension, DOE, PVD.    Limitations Sitting;Walking;Lifting;House hold activities;Standing    How long can you sit comfortably? Pt reports interrupted after an hour due to needing to use restroom (on lasix)    How long can you stand comfortably? <15 minutes due to fatigue    How long can you walk comfortably? Pt reports he can walk 20-30 minutes without stopping, but slowly    Diagnostic tests none recent    Patient Stated Goals Pt would like to improve his balance and his walking ability    Currently in Pain? No/denies              [facility] PT Assessment - 02/20/22  0001       Standardized Balance Assessment   Standardized Balance Assessment Berg Balance Test      Berg Balance Test   Sit to Stand Able to stand without using hands and stabilize independently    Standing Unsupported Able to stand safely 2 minutes    Sitting with Back Unsupported but Feet Supported on Floor or Stool Able to sit safely and securely 2 minutes    Stand to Sit Sits safely with minimal use of hands    Transfers Able to transfer safely, minor use of hands    Standing Unsupported with Eyes Closed Able to stand 10 seconds safely    Standing Unsupported with Feet Together Able to place  feet together independently and stand 1 minute safely    From Standing, Reach Forward with Outstretched Arm Can reach forward >12 cm safely (5")    From Standing Position, Pick up Object from Floor Able to pick up shoe safely and easily    From Standing Position, Turn to Look Behind Over each Shoulder Looks behind from both sides and weight shifts well    Turn 360 Degrees Able to turn 360 degrees safely one side only in 4 seconds or less    Standing Unsupported, Alternately Place Feet on Step/Stool Able to stand independently and complete 8 steps >20 seconds    Standing Unsupported, One Foot in Front Able to plae foot ahead of the other independently and hold 30 seconds    Standing on One Leg Unable to try or needs assist to prevent fall    Total Score 48              INTERVENTIONS STS 3x10; pt rates as medium Standing hip abduction 1x15, 2x10 each LE; pt rates medium Seated march with 3# ankle weights donned each LE 2x20 alternating; pt rates medium Semi-tandem stance 2x30 sec each LE Amb. For endurance with 3# weights donned 2x 160 ft; pt rates medium, CGA provided LAQ with 3# weights donned each LE 20x alternating   Berg- 48/56  Reiewed HEP with pt: Access Code: [email or web address] URL: [email or web address] Date: 02/20/2022 Prepared by: Ricard Dillon  Exercises Sit to Stand with Counter Support - 1 x daily - 5 x weekly - 3 sets - 10 reps Semi-Tandem Balance at [employer] Eyes Open - 1 x daily - 7 x weekly - 2 sets - 2 reps - 30 hold Standing Hip Abduction with Counter Support - 1 x daily - 5 x weekly - 3 sets - 10 reps   Pt educated throughout session about proper posture and technique with exercises. Improved exercise technique, movement at target joints, use of target muscles after min to mod verbal, visual, tactile cues.        PT Education - 02/20/22 1239     Education Details exercise technique, HEP    Person(s) Educated Patient    Methods  Explanation;Demonstration;Verbal cues;Handout    Comprehension Verbalized understanding;Returned demonstration;Need further instruction;Verbal cues required              PT Short Term Goals - 02/15/22 0830       PT SHORT TERM GOAL #1   Title Patient will be independent in home exercise program to improve strength/mobility for better functional independence with ADLs.    Baseline 2/23: to be issued next 1-2 sessions    Time 6    Period Weeks    Status New    Target Date 03/29/22  PT Long Term Goals - 02/20/22 0902       PT LONG TERM GOAL #1   Title Patient will increase FOTO score to equal to or greater than 76  to demonstrate statistically significant improvement in mobility and quality of life.    Baseline 2/23: 75    Time 12    Period Weeks    Status New    Target Date 05/10/22      PT LONG TERM GOAL #2   Title Patient will increase six minute walk test distance to >1000 for progression to community ambulator and improve gait ability    Baseline 2/23: 720 ft    Time 12    Period Weeks    Status New    Target Date 05/10/22      PT LONG TERM GOAL #3   Title Patient will increase 10 meter walk test to >1.77m/s as to improve gait speed for better community ambulation and to reduce fall risk.    Baseline 2/23: 0.8 m/s    Time 12    Period Weeks    Status New    Target Date 05/10/22      PT LONG TERM GOAL #4   Title Patient will tolerate 5 seconds of single leg stance without loss of balance to improve ability to get in and out of shower safely.    Baseline 2/23: unable to maintain 1 sec on each LE    Time 12    Period Weeks    Status New    Target Date 05/10/22      PT LONG TERM GOAL #5   Title Patient will increase Berg Balance score by > 6 points to demonstrate decreased fall risk during functional activities.    Baseline 2/23: deferred to future date d/t time; 2/28: 48/56    Time 12    Period Weeks    Status New    Target Date 05/10/22                    Plan - 02/20/22 0076     Clinical Impression Statement PT provides HEP this session. Pt highly motivated to participate and responds well to interventions without pain, but is limited due to fatigue, requiring multiple seated rest breaks. He rates the majority of interventions as medium difficulty. He was most challenged with standing hip abduction and exhibited difficulty maintaining correct technique. The pt will benefit from further skilled PT to improve mobility, gait, balance and strength to increase QOL and decrease fall risk.    Personal Factors and Comorbidities Age;Comorbidity 3+    Comorbidities hx of surgical procedure to remove clots in lungs, PE, OSA, lymphedema,  (chronic thromboembolic pulmonary hypertension, DOE, PVD    Examination-Activity Limitations Bend;Lift;Locomotion Level;Reach Overhead;Sit;Squat;Stairs;Stand;Transfers    Examination-Participation Restrictions Community Activity;Yard Work;Shop;Cleaning    Stability/Clinical Decision Making Stable/Uncomplicated    Rehab Potential Good    PT Frequency 2x / week    PT Duration 12 weeks    PT Treatment/Interventions ADLs/Self Care Home Management;Aquatic Therapy;Canalith Repostioning;Biofeedback;Cryotherapy;Electrical Stimulation;Iontophoresis 4mg /ml Dexamethasone;Moist Heat;Traction;Parrafin;Contrast Bath;DME Instruction;Gait training;Stair training;Functional mobility training;Therapeutic activities;Therapeutic exercise;Balance training;Neuromuscular re-education;Patient/family education;Orthotic Fit/Training;Manual techniques;Wheelchair mobility training;Passive range of motion;Energy conservation;Splinting;Taping;Vestibular;Vasopneumatic Device;Visual/perceptual remediation/compensation;Spinal Manipulations;Joint Manipulations    PT Next Visit Plan BERG, issue HEP, strength, balance, endurance    PT Home Exercise Plan Access Code: [email or web address]    Consulted and Agree with Plan of Care Patient              Patient will benefit from  skilled therapeutic intervention in order to improve the following deficits and impairments:  Abnormal gait, Decreased activity tolerance, Decreased endurance, Decreased range of motion, Decreased strength, Hypomobility, Improper body mechanics, Decreased balance, Decreased mobility, Difficulty walking, Increased edema, Impaired flexibility, Postural dysfunction  Visit Diagnosis: Unsteadiness on feet  Other abnormalities of gait and mobility  Muscle weakness (generalized)  Other lack of coordination     Problem List 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   Mild cognitive impairment 05/08/2018   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[place]) 05/02/2018   Chest pain 05/02/2018   Palpitations 05/02/2018   Long term (current) use of anticoagulants 02/18/2018   Pulmonary embolus ([place]) 01/06/2018   Primary osteoarthritis of both knees 11/10/2017   Lymphedema of both lower extremities 10/25/2017   Hyperlipidemia, mixed 06/01/2016   Mild depression 06/01/2016   Dyspnea on exertion 03/01/2016   Non morbid obesity due to excess calories 03/01/2016   PVD (peripheral vascular disease) ([place]) 03/01/2016   Primary osteoarthritis of left knee 04/20/2015   Primary osteoarthritis of right knee 04/20/2015   BMI 40.0-44.9, adult ([place]) 04/20/2015   DOE (dyspnea on exertion) 01/26/2015   OSA on CPAP 01/26/2015   Morbid obesity  ([place]) 01/26/2015   Chronic edema 01/13/2015   Hyperglycemia, unspecified 01/13/2015   Nephrolithiasis 01/13/2015   Obstructive sleep apnea 05/08/2013    Zollie Pee, PT 02/20/2022, 12:39 PM  [place] MAIN [facility] SERVICES [address] [place], [place], [address] Phone: [phone]   Fax:  [phone]  Name: Adam Knight MRN: [id number] Date of Birth: 01-Mar-1941

## 2022-02-26 ENCOUNTER — Other Ambulatory Visit: Payer: Self-pay

## 2022-02-26 ENCOUNTER — Encounter: Payer: Self-pay | Admitting: Physical Therapy

## 2022-02-26 ENCOUNTER — Ambulatory Visit: Payer: Medicare Other | Attending: Internal Medicine | Admitting: Physical Therapy

## 2022-02-26 DIAGNOSIS — R2689 Other abnormalities of gait and mobility: Secondary | ICD-10-CM

## 2022-02-26 DIAGNOSIS — R278 Other lack of coordination: Secondary | ICD-10-CM

## 2022-02-26 DIAGNOSIS — R2681 Unsteadiness on feet: Secondary | ICD-10-CM

## 2022-02-26 DIAGNOSIS — M6281 Muscle weakness (generalized): Secondary | ICD-10-CM

## 2022-02-26 NOTE — Therapy (Signed)
[place] MAIN [facility] SERVICES [address] [place], [place], [address] Phone: [phone]   Fax:  [phone]  Physical Therapy Treatment  Patient Details  Name: Adam Knight MRN: [id number] Date of Birth: [birth year] Referring Provider (PT): End, Harrell Gave, MD   Encounter Date: 02/26/2022   PT End of Session - 02/26/22 0933     Visit Number 3    Number of Visits 25    Date for PT Re-Evaluation 05/10/22    Authorization Time Period 02/15/2022-05/10/2022    PT Start Time 0932    PT Stop Time 1015    PT Time Calculation (min) 43 min    Equipment Utilized During Treatment Gait belt    Activity Tolerance Patient tolerated treatment well    Behavior During Therapy [facility] for tasks assessed/performed             Past Medical History:  Diagnosis Date   Actinic keratosis    Atypical mole 04/18/2020   R temple Atypical Letiginous melanocytic proliferation with regression, clear post biopsy   Basal cell carcinoma 04/18/2020   Right mid upper forehead, MOHs 06/22/20   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   Subjective Assessment - 02/26/22 0936     Subjective "I was sanding the deck yesterday and I have two bad knees and now two bad ankles." He reports soaking in epsolm salt and has been taking short steps; "I don't know why I am hurting so bad, I have sanded the deck before without  issues."    Pertinent History Pt presents to PT with reports of deconditioning/decreased activity tolerance, balance and strength. Pt reports he noticed a decline 3 years ago following surgical procedure to remove clots from his lungs. Pt did attend pulmonary rehab following procedure but continues to feel somewhat short of breath with activities. Pt reports he has noticed his gait speed has slowed and that his UEs and LEs are weaker. He does report lymphedema in LLE. He states I have two bad knees, but denies pain. He reports unsteadiness with gait and FOF with ascending/descending curbs. Pt endorses difficulty standing up from the floor. The pt reports no falls in past 6 months. He does not use an AD. Other PMH significant for the following: hx of surgical procedure to remove clots in lungs (per pt report 3 years ago), PE, OSA, lymphedema, chronic thromboembolic pulmonary hypertension, DOE, PVD.    Limitations Sitting;Walking;Lifting;House hold activities;Standing    How long can you sit comfortably? Pt reports interrupted after an hour due to needing to use restroom (on lasix)    How long can you stand comfortably? <15 minutes due to fatigue    How long can you walk comfortably? Pt reports he can walk 20-30 minutes without stopping, but slowly    Diagnostic tests none recent    Patient Stated Goals Pt would like to improve his  balance and his walking ability    Currently in Pain? Yes    Pain Score 5     Pain Location Leg    Pain Orientation Right;Left    Pain Descriptors / Indicators Aching    Pain Type Acute pain    Pain Onset Today    Pain Frequency Intermittent    Aggravating Factors  worse with walking and weight bearing    Pain Relieving Factors hasn't tried ice; does help to soak with epsolm salt    Effect of Pain on Daily Activities decreased activity tolerance;    Multiple Pain Sites No               TREATMENT: Patient instructed in gait to assess mobility, he ambulates  with short shuffled steps, antalgic gait, slower gait speed, wide base of support with toes turned out 10 meter walk: 18.65 sec , 0.54 m/s   Patient hooklying: Feet apart, lumbar trunk rotation (windshield wiper) x10 reps each LE; Single knee to chest stretch 10 sec hold x1 rep each LE  Progressed to hip circles x5 reps clockwise/counterclockwise PT performed passive hamstring stretch 20 sec hold  Progressed with ankle pump x10 reps each LE PT performed passive hip adductor stretch with knee flexed/extended x5 reps each LE;  Instructed patient in ROM/strengthening exercise: -hamstring curl x10 reps each LE  Patient reports increased RLE ankle pain during curl PT assessed RLE ankle, with stiffness noted in ankle DF  Manual therapy: PT performed grade II-III AP mobs to right talocrual joint 10 sec hold x3 reps Following joint mobs, patient instructed in 5 more heel slides on RLE with no pain reported  Hooklying: -SAQ 2# x15 reps each LE with cues to slow down LE movement for better motor control;  -SLR 2# x10 reps each LE with mod VCS to keep knee straight for better motor control; Patient had difficulty keeping knee extended often allowing a 5 degree lag when lifting leg.   Seated: LAQ with cues for ankle DF to facilitate better stretch and ROM, 2# x10 reps each LE  Patient instructed in gait around gym,  10 meter walk speed 19 sec (0.53 m/s) He does exhibit better step length and less antalgic gait, normal base of support. He reports less soreness but states that his left knee is still bothering him.   Reinforced HEP, instructing patient to work on seated LAQ and ankle pumps prior to standing to help alleviate stiffness/soreness;                        PT Education - 02/26/22 0933     Education Details exercise technique, HEP    Person(s) Educated Patient    Methods Explanation;Verbal cues    Comprehension Verbalized understanding;Returned  demonstration;Verbal cues required;Need further instruction              PT Short Term Goals - 02/15/22 0830       PT SHORT TERM GOAL #1   Title Patient will be independent in home exercise program to improve strength/mobility for better functional independence with ADLs.    Baseline 2/23: to be issued next 1-2 sessions    Time 6    Period Weeks    Status New    Target Date 03/29/22               PT Long Term Goals - 02/20/22 0902       PT LONG TERM GOAL #1  Title Patient will increase FOTO score to equal to or greater than 76  to demonstrate statistically significant improvement in mobility and quality of life.    Baseline 2/23: 75    Time 12    Period Weeks    Status New    Target Date 05/10/22      PT LONG TERM GOAL #2   Title Patient will increase six minute walk test distance to >1000 for progression to community ambulator and improve gait ability    Baseline 2/23: 720 ft    Time 12    Period Weeks    Status New    Target Date 05/10/22      PT LONG TERM GOAL #3   Title Patient will increase 10 meter walk test to >1.72ms as to improve gait speed for better community ambulation and to reduce fall risk.    Baseline 2/23: 0.8 m/s    Time 12    Period Weeks    Status New    Target Date 05/10/22      PT LONG TERM GOAL #4   Title Patient will tolerate 5 seconds of single leg stance without loss of balance to improve ability to get in and out of shower safely.    Baseline 2/23: unable to maintain 1 sec on each LE    Time 12    Period Weeks    Status New    Target Date 05/10/22      PT LONG TERM GOAL #5   Title Patient will increase Berg Balance score by > 6 points to demonstrate decreased fall risk during functional activities.    Baseline 2/23: deferred to future date d/t time; 2/28: 48/56    Time 12    Period Weeks    Status New    Target Date 05/10/22                   Plan - 02/26/22 0955     Clinical Impression Statement Patient  reports increased soreness in BLE knee and ankle which limited exercise tolerance. PT instructed patient in ROM exercise to facilitate better mobility and reduce stiffness. He initially reported soreness with exercise but with increased repetition, reports less soreness. Patient does require min-mod VCs for proper positioning as he exhibits decreased motor control. He ambulates with continued slower gait speed at end of session but was able to exhibit better step length and improved base of support. Reinforced HEP with instruction to do seated exercise prior to standing for better tolerance; He would benefit from additional skilled PT Intervention to improve strength, balance and mobility;    Personal Factors and Comorbidities Age;Comorbidity 3+    Comorbidities hx of surgical procedure to remove clots in lungs, PE, OSA, lymphedema,  (chronic thromboembolic pulmonary hypertension, DOE, PVD    Examination-Activity Limitations Bend;Lift;Locomotion Level;Reach Overhead;Sit;Squat;Stairs;Stand;Transfers    Examination-Participation Restrictions Community Activity;Yard Work;Shop;Cleaning    Stability/Clinical Decision Making Stable/Uncomplicated    Rehab Potential Good    PT Frequency 2x / week    PT Duration 12 weeks    PT Treatment/Interventions ADLs/Self Care Home Management;Aquatic Therapy;Canalith Repostioning;Biofeedback;Cryotherapy;Electrical Stimulation;Iontophoresis '4mg'$ /ml Dexamethasone;Moist Heat;Traction;Parrafin;Contrast Bath;DME Instruction;Gait training;Stair training;Functional mobility training;Therapeutic activities;Therapeutic exercise;Balance training;Neuromuscular re-education;Patient/family education;Orthotic Fit/Training;Manual techniques;Wheelchair mobility training;Passive range of motion;Energy conservation;Splinting;Taping;Vestibular;Vasopneumatic Device;Visual/perceptual remediation/compensation;Spinal Manipulations;Joint Manipulations    PT Next Visit Plan BERG, issue HEP, strength,  balance, endurance    PT Home Exercise Plan Access Code: [email or web address]   Consulted and Agree with Plan of Care Patient  Patient will benefit from skilled therapeutic intervention in order to improve the following deficits and impairments:  Abnormal gait, Decreased activity tolerance, Decreased endurance, Decreased range of motion, Decreased strength, Hypomobility, Improper body mechanics, Decreased balance, Decreased mobility, Difficulty walking, Increased edema, Impaired flexibility, Postural dysfunction  Visit Diagnosis: Unsteadiness on feet  Other abnormalities of gait and mobility  Muscle weakness (generalized)  Other lack of coordination     Problem List 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   Mild cognitive impairment 05/08/2018   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[place]) 05/02/2018   Chest pain 05/02/2018   Palpitations 05/02/2018   Long term (current) use of anticoagulants 02/18/2018   Pulmonary embolus ([place]) 01/06/2018   Primary osteoarthritis of both knees 11/10/2017   Lymphedema of both lower extremities 10/25/2017   Hyperlipidemia, mixed 06/01/2016   Mild depression 06/01/2016   Dyspnea on exertion 03/01/2016   Non morbid obesity due to excess calories 03/01/2016   PVD (peripheral vascular disease) ([place]) 03/01/2016   Primary osteoarthritis of left knee 04/20/2015   Primary osteoarthritis of right knee 04/20/2015    BMI 40.0-44.9, adult ([place]) 04/20/2015   DOE (dyspnea on exertion) 01/26/2015   OSA on CPAP 01/26/2015   Morbid obesity ([place]) 01/26/2015   Chronic edema 01/13/2015   Hyperglycemia, unspecified 01/13/2015   Nephrolithiasis 01/13/2015   Obstructive sleep apnea 05/08/2013    Arsalan Brisbin, PT, DPT 02/26/2022, 11:21 AM  [place] MAIN [facility] SERVICES [address] [place], [place], [address] Phone: [phone]   Fax:  [phone]  Name: Haward Pope MRN: [id number] Date of Birth: 29-May-1941

## 2022-02-28 ENCOUNTER — Ambulatory Visit: Payer: Medicare Other | Admitting: Physical Therapy

## 2022-02-28 ENCOUNTER — Encounter: Payer: Self-pay | Admitting: Physical Therapy

## 2022-02-28 ENCOUNTER — Other Ambulatory Visit: Payer: Self-pay

## 2022-02-28 DIAGNOSIS — R2681 Unsteadiness on feet: Secondary | ICD-10-CM | POA: Diagnosis not present

## 2022-02-28 DIAGNOSIS — R2689 Other abnormalities of gait and mobility: Secondary | ICD-10-CM

## 2022-02-28 DIAGNOSIS — M6281 Muscle weakness (generalized): Secondary | ICD-10-CM

## 2022-02-28 DIAGNOSIS — R278 Other lack of coordination: Secondary | ICD-10-CM

## 2022-02-28 NOTE — Therapy (Signed)
[place] MAIN [facility] SERVICES [address] [place], [place], [address] Phone: [phone]   Fax:  [phone]  Physical Therapy Treatment  Patient Details  Name: Adam Knight MRN: [id number] Date of Birth: 06-30-1941 Referring Provider (PT): End, Harrell Gave, MD   Encounter Date: 02/28/2022   PT End of Session - 02/28/22 1009     Visit Number 4    Number of Visits 25    Date for PT Re-Evaluation 05/10/22    Authorization Time Period 02/15/2022-05/10/2022    PT Start Time 0932    PT Stop Time 1015    PT Time Calculation (min) 43 min    Activity Tolerance Patient tolerated treatment well    Behavior During Therapy [facility] for tasks assessed/performed             Past Medical History:  Diagnosis Date   Actinic keratosis    Atypical mole 04/18/2020   R temple Atypical Letiginous melanocytic proliferation with regression, clear post biopsy   Basal cell carcinoma 04/18/2020   Right mid upper forehead, MOHs 06/22/20   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   Subjective Assessment - 02/28/22 0936     Subjective Patient reports doing more sanding over the weekend. He reports that his legs are stiff and sore."They were better and then they got worse again."    Pertinent History Pt presents to PT with reports of deconditioning/decreased activity tolerance, balance and strength. Pt reports he  noticed a decline 3 years ago following surgical procedure to remove clots from his lungs. Pt did attend pulmonary rehab following procedure but continues to feel somewhat short of breath with activities. Pt reports he has noticed his gait speed has slowed and that his UEs and LEs are weaker. He does report lymphedema in LLE. He states I have two bad knees, but denies pain. He reports unsteadiness with gait and FOF with ascending/descending curbs. Pt endorses difficulty standing up from the floor. The pt reports no falls in past 6 months. He does not use an AD. Other PMH significant for the following: hx of surgical procedure to remove clots in lungs (per pt report 3 years ago), PE, OSA, lymphedema, chronic thromboembolic pulmonary hypertension, DOE, PVD.    Limitations Sitting;Walking;Lifting;House hold activities;Standing    How long can you sit comfortably? Pt reports interrupted after an hour due to needing to use restroom (on lasix)    How long can you stand comfortably? <15 minutes due to fatigue    How long can you walk comfortably? Pt reports he can walk 20-30 minutes without stopping, but slowly    Diagnostic tests none recent    Patient Stated Goals Pt would like to improve his balance and his walking ability    Currently in Pain? Yes    Pain Score 5     Pain Location Leg    Pain Orientation  Right;Left    Pain Descriptors / Indicators Aching    Pain Type Acute pain    Pain Onset In the past 7 days    Pain Frequency Intermittent    Aggravating Factors  worse with walking/weight bearing    Pain Relieving Factors soak with epsolm salt    Effect of Pain on Daily Activities decreased activity tolerance;    Multiple Pain Sites No                   TREATMENT:    Patient hooklying: Feet apart, lumbar trunk rotation (windshield wiper) x10 reps each LE; Single knee to chest stretch 20 sec hold x1 rep each LE             Progressed to hip circles x5 reps  clockwise/counterclockwise LE cross contralateral limb with hip adduction/IR for IT band stretch x5 reps AROM each LE;   Instructed patient in LE exercise to improve strength/ROM and to help alleviate lymphedema Diaphragmatic breathing x5 reps Posterior pelvic tilt x10 reps with cues to slow down pelvic rock and increase hold time for better core activation and to reduce strain; Patient also required cues to avoid bridging;  Heel slides x10 reps each LE with cues to avoid painful ROM; Hip abduction/ER x10 reps each LE; Hip abduction/adduction ROM in supine x10 reps each LE Ankle DF/PF x10 reps Ankle IV/EV x10 reps; Toe curl and toe splaying x10 reps with therapist providing cues to isolate toe movement;  Provided written HEP for patient to work on LE exercise for flexibility but to also help reduce lymphedema;  Hooklying: -SAQ 2# x15 reps each LE with cues to slow down LE movement for better motor control;  -SLR 2# x15 reps each LE with mod VCS to keep knee straight for better motor control; Patient had difficulty keeping knee extended often allowing a 5 degree lag when lifting leg.    Seated: LAQ with cues for ankle DF to facilitate better stretch and ROM, 2# x10 reps each LE   He does exhibit better step length after exercise, but continues to exhibit antalgic gait left with normal base of support. He reports less soreness but states that his left knee is still bothering him.    Provided written HEP for LE exercise, as well as reinforced importance for patient to work on seated LAQ and ankle pumps prior to standing to help alleviate stiffness/soreness;                             PT Education - 02/28/22 1008     Education Details exercise technique/HEP    Person(s) Educated Patient    Methods Explanation;Verbal cues    Comprehension Verbalized understanding;Returned demonstration;Verbal cues required;Need further instruction              PT Short Term  Goals - 02/15/22 0830       PT SHORT TERM GOAL #1   Title Patient will be independent in home exercise program to improve strength/mobility for better functional independence with ADLs.    Baseline 2/23: to be issued next 1-2 sessions    Time 6    Period Weeks    Status New    Target Date 03/29/22               PT Long Term Goals - 02/20/22 0902       PT LONG TERM GOAL #1   Title Patient will increase  FOTO score to equal to or greater than 76  to demonstrate statistically significant improvement in mobility and quality of life.    Baseline 2/23: 75    Time 12    Period Weeks    Status New    Target Date 05/10/22      PT LONG TERM GOAL #2   Title Patient will increase six minute walk test distance to >1000 for progression to community ambulator and improve gait ability    Baseline 2/23: 720 ft    Time 12    Period Weeks    Status New    Target Date 05/10/22      PT LONG TERM GOAL #3   Title Patient will increase 10 meter walk test to >1.41ms as to improve gait speed for better community ambulation and to reduce fall risk.    Baseline 2/23: 0.8 m/s    Time 12    Period Weeks    Status New    Target Date 05/10/22      PT LONG TERM GOAL #4   Title Patient will tolerate 5 seconds of single leg stance without loss of balance to improve ability to get in and out of shower safely.    Baseline 2/23: unable to maintain 1 sec on each LE    Time 12    Period Weeks    Status New    Target Date 05/10/22      PT LONG TERM GOAL #5   Title Patient will increase Berg Balance score by > 6 points to demonstrate decreased fall risk during functional activities.    Baseline 2/23: deferred to future date d/t time; 2/28: 48/56    Time 12    Period Weeks    Status New    Target Date 05/10/22                   Plan - 02/28/22 1032     Clinical Impression Statement Patient motivated and participated fair within session. He reports continued BLE knee pain but states that  his right ankle was a little better. He does report increased LE swelling related to lymphedema. PT instructed patient in lymphedema LE exercise to help with swelling but also to facilitate ROM/flexibility in LE to help reduce stiffness. He does require min VCs for proper exercise technique. Provided written handout for adherence. Patient continues to have joint discomfort likely related to OA. Instructed patient to work on moving LE more prior to standing for less stiffness. He verbalized understanding. he would benefit from additional skilled PT Intervention to improve strength and mobility;    Personal Factors and Comorbidities Age;Comorbidity 3+    Comorbidities hx of surgical procedure to remove clots in lungs, PE, OSA, lymphedema,  (chronic thromboembolic pulmonary hypertension, DOE, PVD    Examination-Activity Limitations Bend;Lift;Locomotion Level;Reach Overhead;Sit;Squat;Stairs;Stand;Transfers    Examination-Participation Restrictions Community Activity;Yard Work;Shop;Cleaning    Stability/Clinical Decision Making Stable/Uncomplicated    Rehab Potential Good    PT Frequency 2x / week    PT Duration 12 weeks    PT Treatment/Interventions ADLs/Self Care Home Management;Aquatic Therapy;Canalith Repostioning;Biofeedback;Cryotherapy;Electrical Stimulation;Iontophoresis '4mg'$ /ml Dexamethasone;Moist Heat;Traction;Parrafin;Contrast Bath;DME Instruction;Gait training;Stair training;Functional mobility training;Therapeutic activities;Therapeutic exercise;Balance training;Neuromuscular re-education;Patient/family education;Orthotic Fit/Training;Manual techniques;Wheelchair mobility training;Passive range of motion;Energy conservation;Splinting;Taping;Vestibular;Vasopneumatic Device;Visual/perceptual remediation/compensation;Spinal Manipulations;Joint Manipulations    PT Next Visit Plan BERG, issue HEP, strength, balance, endurance    PT Home Exercise Plan Access Code: [email or web address]   Consulted and Agree with  Plan of Care Patient  Patient will benefit from skilled therapeutic intervention in order to improve the following deficits and impairments:  Abnormal gait, Decreased activity tolerance, Decreased endurance, Decreased range of motion, Decreased strength, Hypomobility, Improper body mechanics, Decreased balance, Decreased mobility, Difficulty walking, Increased edema, Impaired flexibility, Postural dysfunction  Visit Diagnosis: Unsteadiness on feet  Other abnormalities of gait and mobility  Muscle weakness (generalized)  Other lack of coordination     Problem List 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   Mild cognitive impairment 05/08/2018   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[place]) 05/02/2018   Chest pain 05/02/2018   Palpitations 05/02/2018   Long term (current) use of anticoagulants 02/18/2018   Pulmonary embolus ([place]) 01/06/2018   Primary osteoarthritis of both knees 11/10/2017   Lymphedema of both lower extremities 10/25/2017   Hyperlipidemia, mixed 06/01/2016   Mild depression 06/01/2016   Dyspnea on exertion 03/01/2016   Non morbid obesity due to excess calories 03/01/2016   PVD (peripheral vascular disease) ([place]) 03/01/2016   Primary osteoarthritis of left knee 04/20/2015   Primary osteoarthritis of right knee 04/20/2015   BMI 40.0-44.9, adult ([place]) 04/20/2015   DOE (dyspnea on exertion) 01/26/2015   OSA on CPAP  01/26/2015   Morbid obesity ([place]) 01/26/2015   Chronic edema 01/13/2015   Hyperglycemia, unspecified 01/13/2015   Nephrolithiasis 01/13/2015   Obstructive sleep apnea 05/08/2013    Kennedee Kitzmiller, PT, DPT 02/28/2022, 10:41 AM  [place] [address] [place], [place], [address] Phone: [phone]   Fax:  [phone]  Name: Adam Knight MRN: [id number] Date of Birth: 1941/07/22

## 2022-03-05 ENCOUNTER — Other Ambulatory Visit: Payer: Self-pay

## 2022-03-05 ENCOUNTER — Ambulatory Visit: Payer: Medicare Other

## 2022-03-05 DIAGNOSIS — M6281 Muscle weakness (generalized): Secondary | ICD-10-CM

## 2022-03-05 DIAGNOSIS — R2681 Unsteadiness on feet: Secondary | ICD-10-CM | POA: Diagnosis not present

## 2022-03-05 DIAGNOSIS — R2689 Other abnormalities of gait and mobility: Secondary | ICD-10-CM

## 2022-03-05 NOTE — Therapy (Signed)
[place] MAIN [facility] SERVICES [address] [place], [place], [address] Phone: [phone]   Fax:  [phone]  Physical Therapy Treatment  Patient Details  Name: Adam Knight MRN: [id number] Date of Birth: 07/03/1941 Referring Provider (PT): End, Harrell Gave, MD   Encounter Date: 03/05/2022   PT End of Session - 03/05/22 0828     Visit Number 5    Number of Visits 25    Date for PT Re-Evaluation 05/10/22    Authorization Time Period 02/15/2022-05/10/2022    PT Start Time 0803    PT Stop Time 0846    PT Time Calculation (min) 43 min    Equipment Utilized During Treatment Gait belt    Activity Tolerance Patient tolerated treatment well    Behavior During Therapy [facility] for tasks assessed/performed             Past Medical History:  Diagnosis Date   Actinic keratosis    Atypical mole 04/18/2020   R temple Atypical Letiginous melanocytic proliferation with regression, clear post biopsy   Basal cell carcinoma 04/18/2020   Right mid upper forehead, MOHs 06/22/20   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   Subjective Assessment - 03/05/22 0805     Subjective Pt reports some L ankle pain and continued B knee pain. Pt difficulty rating pain level. Pt reports no stumbles/falls. Pt reports he did not perform his HEP.    Pertinent History Pt presents to PT with reports of  deconditioning/decreased activity tolerance, balance and strength. Pt reports he noticed a decline 3 years ago following surgical procedure to remove clots from his lungs. Pt did attend pulmonary rehab following procedure but continues to feel somewhat short of breath with activities. Pt reports he has noticed his gait speed has slowed and that his UEs and LEs are weaker. He does report lymphedema in LLE. He states I have two bad knees, but denies pain. He reports unsteadiness with gait and FOF with ascending/descending curbs. Pt endorses difficulty standing up from the floor. The pt reports no falls in past 6 months. He does not use an AD. Other PMH significant for the following: hx of surgical procedure to remove clots in lungs (per pt report 3 years ago), PE, OSA, lymphedema, chronic thromboembolic pulmonary hypertension, DOE, PVD.    Limitations Sitting;Walking;Lifting;House hold activities;Standing    How long can you sit comfortably? Pt reports interrupted after an hour due to needing to use restroom (on lasix)    How long can you stand comfortably? <15 minutes due to fatigue    How long can you walk comfortably? Pt reports he can walk 20-30 minutes without stopping, but slowly    Diagnostic tests none recent    Patient Stated Goals Pt would like to improve his balance and his walking ability    Currently in Pain? Yes    Pain Location Ankle  Pain Orientation Left    Pain Onset In the past 7 days            TREATMENT:    Therex- Patient hooklying: Feet apart, lumbar trunk rotation (windshield wiper) x20  Single knee to chest stretch 20 sec hold x1 rep each LE -SLR 2# 2x10 reps each LE with mod VCS to keep knee straight for better motor control; Pt challenged with intervention.   Seated: LAQ with 2# ankle weights 20x. Pt rates as easy  LAQ with 4# ankle weights 20x Marches 16x with 4# ankle weights. Pt rates as medium  STS - 10x with with use of BUEs.  Seated hip abduction/ER  6" box toe taps 15x each LE th 4# weights donned.    PT instructs pt in importance of performing HEP.    Gait training- Treadmill retro-stepping to promote hip extension x 4 minutes at 0.3 mph. PT provides frequent VC for technique/sequencing (pt tends to want to take forward steps)  Step-length intervention over 10 meters: 20 steps 17 steps 17 steps 16 steps  Pt educated throughout session about proper posture and technique with exercises. Improved exercise technique, movement at target joints, use of target muscles after min to mod verbal, visual, tactile cues.     PT Short Term Goals - 02/15/22 0830       PT SHORT TERM GOAL #1   Title Patient will be independent in home exercise program to improve strength/mobility for better functional independence with ADLs.    Baseline 2/23: to be issued next 1-2 sessions    Time 6    Period Weeks    Status New    Target Date 03/29/22               PT Long Term Goals - 02/20/22 0902       PT LONG TERM GOAL #1   Title Patient will increase FOTO score to equal to or greater than 76  to demonstrate statistically significant improvement in mobility and quality of life.    Baseline 2/23: 75    Time 12    Period Weeks    Status New    Target Date 05/10/22      PT LONG TERM GOAL #2   Title Patient will increase six minute walk test distance to >1000 for progression to community ambulator and improve gait ability    Baseline 2/23: 720 ft    Time 12    Period Weeks    Status New    Target Date 05/10/22      PT LONG TERM GOAL #3   Title Patient will increase 10 meter walk test to >1.23ms as to improve gait speed for better community ambulation and to reduce fall risk.    Baseline 2/23: 0.8 m/s    Time 12    Period Weeks    Status New    Target Date 05/10/22      PT LONG TERM GOAL #4   Title Patient will tolerate 5 seconds of single leg stance without loss of balance to improve ability to get in and out of shower safely.     Baseline 2/23: unable to maintain 1 sec on each LE    Time 12    Period Weeks    Status New    Target Date 05/10/22      PT LONG TERM GOAL #5   Title Patient will increase Berg Balance score by > 6 points to demonstrate decreased fall risk during  functional activities.    Baseline 2/23: deferred to future date d/t time; 2/28: 48/56    Time 12    Period Weeks    Status New    Target Date 05/10/22                   Plan - 03/05/22 6962     Clinical Impression Statement Pt motivated to participate in session. Pt tolerates majority of interventions well, but is still somewhat limited secondary to chronic B knee/ankle pain. Pt instructed pt in interventions to target hip extension and step length to promote ease with gait and improved gait mechanics. The pt will continue to benefit from further skilled PT intervention to improve strength and mobility.    Personal Factors and Comorbidities Age;Comorbidity 3+    Comorbidities hx of surgical procedure to remove clots in lungs, PE, OSA, lymphedema,  (chronic thromboembolic pulmonary hypertension, DOE, PVD    Examination-Activity Limitations Bend;Lift;Locomotion Level;Reach Overhead;Sit;Squat;Stairs;Stand;Transfers    Examination-Participation Restrictions Community Activity;Yard Work;Shop;Cleaning    Stability/Clinical Decision Making Stable/Uncomplicated    Rehab Potential Good    PT Frequency 2x / week    PT Duration 12 weeks    PT Treatment/Interventions ADLs/Self Care Home Management;Aquatic Therapy;Canalith Repostioning;Biofeedback;Cryotherapy;Electrical Stimulation;Iontophoresis '4mg'$ /ml Dexamethasone;Moist Heat;Traction;Parrafin;Contrast Bath;DME Instruction;Gait training;Stair training;Functional mobility training;Therapeutic activities;Therapeutic exercise;Balance training;Neuromuscular re-education;Patient/family education;Orthotic Fit/Training;Manual techniques;Wheelchair mobility training;Passive range of motion;Energy  conservation;Splinting;Taping;Vestibular;Vasopneumatic Device;Visual/perceptual remediation/compensation;Spinal Manipulations;Joint Manipulations    PT Next Visit Plan BERG, issue HEP, strength, balance, endurance    PT Home Exercise Plan Access Code: [email or web address]; no updates    Consulted and Agree with Plan of Care Patient             Patient will benefit from skilled therapeutic intervention in order to improve the following deficits and impairments:  Abnormal gait, Decreased activity tolerance, Decreased endurance, Decreased range of motion, Decreased strength, Hypomobility, Improper body mechanics, Decreased balance, Decreased mobility, Difficulty walking, Increased edema, Impaired flexibility, Postural dysfunction  Visit Diagnosis: Muscle weakness (generalized)  Other abnormalities of gait and mobility  Unsteadiness on feet     Problem List 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   Mild cognitive impairment 05/08/2018   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[place]) 05/02/2018   Chest pain 05/02/2018   Palpitations 05/02/2018   Long term (current) use of anticoagulants 02/18/2018   Pulmonary embolus ([place]) 01/06/2018   Primary osteoarthritis of both knees 11/10/2017   Lymphedema of both lower extremities 10/25/2017   Hyperlipidemia, mixed 06/01/2016   Mild depression 06/01/2016   Dyspnea on exertion 03/01/2016   Non morbid obesity due to excess  calories 03/01/2016   PVD (peripheral vascular disease) ([place]) 03/01/2016   Primary osteoarthritis of left knee 04/20/2015   Primary osteoarthritis of right knee 04/20/2015   BMI 40.0-44.9, adult ([place]) 04/20/2015   DOE (dyspnea on exertion) 01/26/2015   OSA on CPAP 01/26/2015   Morbid obesity ([place]) 01/26/2015   Chronic edema 01/13/2015   Hyperglycemia, unspecified 01/13/2015   Nephrolithiasis 01/13/2015   Obstructive sleep apnea 05/08/2013  Zollie Pee, PT 03/05/2022, 8:57 AM  [place] MAIN [facility] SERVICES [address] [place], [place], [address] Phone: [phone]   Fax:  [phone]  Name: Adam Knight MRN: [id number] Date of Birth: [birth year]

## 2022-03-07 ENCOUNTER — Other Ambulatory Visit: Payer: Self-pay

## 2022-03-07 ENCOUNTER — Ambulatory Visit: Payer: Medicare Other

## 2022-03-07 ENCOUNTER — Ambulatory Visit (INDEPENDENT_AMBULATORY_CARE_PROVIDER_SITE_OTHER): Payer: Medicare Other

## 2022-03-07 DIAGNOSIS — R2681 Unsteadiness on feet: Secondary | ICD-10-CM | POA: Diagnosis not present

## 2022-03-07 DIAGNOSIS — Z5181 Encounter for therapeutic drug level monitoring: Secondary | ICD-10-CM

## 2022-03-07 DIAGNOSIS — I2602 Saddle embolus of pulmonary artery with acute cor pulmonale: Secondary | ICD-10-CM

## 2022-03-07 LAB — POCT INR: INR: 3 (ref 2.0–3.0)

## 2022-03-07 NOTE — Therapy (Signed)
[place] ?[facility] ?[place], [place], [address] ?Phone: [phone]   Fax:  [phone] ? ?Physical Therapy Treatment ? ?Patient Details  ?Name: Adam Knight ?MRN: [id number] ?Date of Birth: [birth year] ?Referring Provider (PT): End, Harrell Gave, MD ? ? ?Encounter Date: 03/07/2022 ? ? PT End of Session - 03/08/22 0816   ? ? Visit Number 6   ? Number of Visits 25   ? Date for PT Re-Evaluation 05/10/22   ? Authorization Time Period 02/15/2022-05/10/2022   ? PT Start Time 1151   ? PT Stop Time 7616   ? PT Time Calculation (min) 38 min   ? Equipment Utilized During Treatment Gait belt   ? Activity Tolerance Patient tolerated treatment well   ? Behavior During Therapy [facility] for tasks assessed/performed   ? ?  ?  ? ?  ? ? ?Past Medical History:  ?Diagnosis Date  ? Actinic keratosis   ? Atypical mole 04/18/2020  ? R temple Atypical Letiginous melanocytic proliferation with regression, clear post biopsy  ? Basal cell carcinoma 04/18/2020  ? Right mid upper forehead, MOHs 06/22/20  ? 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 Dyslipidemia   ? Edema   ? Gastric ulcer   ? OSA on CPAP   ? Prediabetes   ? Pulmonary embolism ([place])   ? Seasonal allergies   ? ? ?Past Surgical History:  ?Procedure Laterality Date  ? ENDARTERECTOMY  10/2018  ? Pulmonary endarterectomy for CTEPH - [facility]  ? PULMONARY VENOGRAPHY Bilateral 01/08/2018  ? Procedure: PULMONARY VENOGRAPHY possible thrombolysis;  Surgeon: Katha Cabal, MD;  Location: [facility];  Service: Cardiovascular;  Laterality: Bilateral;  ? TONSILLECTOMY    ? ? ?There were no vitals filed for this visit. ? ? Subjective Assessment - 03/07/22 1155   ? ? Subjective Pt reports moderate pain currently, reports mostly L ankle.   ? Pertinent History Pt presents to PT with reports of deconditioning/decreased activity tolerance, balance and strength. Pt reports he noticed a decline 3 years ago  following surgical procedure to remove clots from his lungs. Pt did attend pulmonary rehab following procedure but continues to feel somewhat short of breath with activities. Pt reports he has noticed his gait speed has slowed and that his UEs and LEs are weaker. He does report lymphedema in LLE. He states ?I have two bad knees,? but denies pain. He reports unsteadiness with gait and FOF with ascending/descending curbs. Pt endorses difficulty standing up from the floor. The pt reports no falls in past 6 months. He does not use an AD. Other PMH significant for the following: hx of surgical procedure to remove clots in lungs (per pt report 3 years ago), PE, OSA, lymphedema, chronic thromboembolic pulmonary hypertension, DOE, PVD.   ? Limitations Sitting;Walking;Lifting;House hold activities;Standing   ? How long can you sit comfortably? Pt reports interrupted after an hour due to needing to use restroom (on lasix)   ? How long can you stand comfortably? <15 minutes due to fatigue   ? How long can you walk comfortably? Pt reports he can walk 20-30 minutes without stopping, but slowly   ? Diagnostic tests none recent   ? Patient Stated Goals Pt would like to improve his balance and his walking ability   ? Currently in Pain? Yes   ? Pain Onset In the past 7 days   ? ?  ?  ? ?  ? ?TREATMENT: ? ?Therex- ? ?  Seated: ?LAQ with 4# ankle weights 20x 2 sets; pt rates as medium ?Marches 20x 2 sets each LE with 4# ankle weights. Pt rates as medium ?  ?STS - 12x, 10x  with with use of BUEs. ?  ?Gait training- ?Step-length intervention over 10 meters: cuing throughout for increased B step length, heel-toe sequencing ?20 steps ?19 steps ?19 steps ?18 steps ? ?Treadmill training to promote increased B step length, correct sequencing and LE endurance x 6 minutes up to 1.1 mph. PT provides frequent VC for technique/sequencing, focus on increasing B step-length.  ? ? ?Pt educated throughout session about proper posture and technique with  exercises. Improved exercise technique, movement at target joints, use of target muscles after min to mod verbal, visual, tactile cues. ?  ? ? PT Short Term Goals - 02/15/22 0830   ? ?  ? PT SHORT TERM GOAL #1  ? Title Patient will be independent in home exercise program to improve strength/mobility for better functional independence with ADLs.   ? Baseline 2/23: to be issued next 1-2 sessions   ? Time 6   ? Period Weeks   ? Status New   ? Target Date 03/29/22   ? ?  ?  ? ?  ? ? ? ? PT Long Term Goals - 02/20/22 0902   ? ?  ? PT LONG TERM GOAL #1  ? Title Patient will increase FOTO score to equal to or greater than 76  to demonstrate statistically significant improvement in mobility and quality of life.   ? Baseline 2/23: 75   ? Time 12   ? Period Weeks   ? Status New   ? Target Date 05/10/22   ?  ? PT LONG TERM GOAL #2  ? Title Patient will increase six minute walk test distance to >1000 for progression to community ambulator and improve gait ability   ? Baseline 2/23: 720 ft   ? Time 12   ? Period Weeks   ? Status New   ? Target Date 05/10/22   ?  ? PT LONG TERM GOAL #3  ? Title Patient will increase 10 meter walk test to >1.45ms as to improve gait speed for better community ambulation and to reduce fall risk.   ? Baseline 2/23: 0.8 m/s   ? Time 12   ? Period Weeks   ? Status New   ? Target Date 05/10/22   ?  ? PT LONG TERM GOAL #4  ? Title Patient will tolerate 5 seconds of single leg stance without loss of balance to improve ability to get in and out of shower safely.   ? Baseline 2/23: unable to maintain 1 sec on each LE   ? Time 12   ? Period Weeks   ? Status New   ? Target Date 05/10/22   ?  ? PT LONG TERM GOAL #5  ? Title Patient will increase Berg Balance score by > 6 points to demonstrate decreased fall risk during functional activities.   ? Baseline 2/23: deferred to future date d/t time; 2/28: 48/56   ? Time 12   ? Period Weeks   ? Status New   ? Target Date 05/10/22   ? ?  ?  ? ?  ? ? ? ? ? ? ? ? Plan -  03/08/22 0818   ? ? Clinical Impression Statement Session somewhat limited secondary to pt late arrival and pt requiring frequent rest breaks throughout. Pt with fair motivation  to participate. Pt was able to progress volume of reps performed with therex. Pt continues to be most challenged with cardioresp. endurance limiting activity and with correcting for correct gait mechanics while ambulating. The pt will continue to benefit from further skilled PT to improve strength, gait and mobility.   ? Personal Factors and Comorbidities Age;Comorbidity 3+   ? Comorbidities hx of surgical procedure to remove clots in lungs, PE, OSA, lymphedema,  (chronic thromboembolic pulmonary hypertension, DOE, PVD   ? Examination-Activity Limitations Bend;Lift;Locomotion Level;Reach Overhead;Sit;Squat;Stairs;Stand;Transfers   ? Examination-Participation Restrictions Community Activity;Yard Work;Shop;Cleaning   ? Stability/Clinical Decision Making Stable/Uncomplicated   ? Rehab Potential Good   ? PT Frequency 2x / week   ? PT Duration 12 weeks   ? PT Treatment/Interventions ADLs/Self Care Home Management;Aquatic Therapy;Canalith Repostioning;Biofeedback;Cryotherapy;Electrical Stimulation;Iontophoresis '4mg'$ /ml Dexamethasone;Moist Heat;Traction;Parrafin;Contrast Bath;DME Instruction;Gait training;Stair training;Functional mobility training;Therapeutic activities;Therapeutic exercise;Balance training;Neuromuscular re-education;Patient/family education;Orthotic Fit/Training;Manual techniques;Wheelchair mobility training;Passive range of motion;Energy conservation;Splinting;Taping;Vestibular;Vasopneumatic Device;Visual/perceptual remediation/compensation;Spinal Manipulations;Joint Manipulations   ? PT Next Visit Plan BERG, issue HEP, strength, balance, endurance   ? PT Home Exercise Plan Access Code: [email or web address]; no updates   ? Consulted and Agree with Plan of Care Patient   ? ?  ?  ? ?  ? ? ?Patient will benefit from skilled therapeutic  intervention in order to improve the following deficits and impairments:  Abnormal gait, Decreased activity tolerance, Decreased endurance, Decreased range of motion, Decreased strength, Hypomobility, Improper bod

## 2022-03-07 NOTE — Patient Instructions (Signed)
-   continue dosage of warfarin of 1 tablet (5 mg) every day EXCEPT 1/2 tablet on MONDAYS & FRIDAYS ?- Recheck INR in 4 weeks ?

## 2022-03-12 ENCOUNTER — Ambulatory Visit: Payer: Medicare Other | Admitting: Physical Therapy

## 2022-03-12 ENCOUNTER — Other Ambulatory Visit: Payer: Self-pay

## 2022-03-12 DIAGNOSIS — R2689 Other abnormalities of gait and mobility: Secondary | ICD-10-CM

## 2022-03-12 DIAGNOSIS — R2681 Unsteadiness on feet: Secondary | ICD-10-CM | POA: Diagnosis not present

## 2022-03-12 DIAGNOSIS — M6281 Muscle weakness (generalized): Secondary | ICD-10-CM

## 2022-03-12 NOTE — Therapy (Signed)
[place] ?[facility] ?[place], [place], [address] ?Phone: [phone]   Fax:  [phone] ? ?Physical Therapy Treatment ? ?Patient Details  ?Name: Adam Knight ?MRN: [id number] ?Date of Birth: August 11, 1941 ?Referring Provider (PT): End, Harrell Gave, MD ? ? ?Encounter Date: 03/12/2022 ? ? PT End of Session - 03/12/22 0951   ? ? Visit Number 7   ? Number of Visits 25   ? Date for PT Re-Evaluation 05/10/22   ? Authorization Time Period 02/15/2022-05/10/2022   ? PT Start Time 0930   ? PT Stop Time 1015   ? PT Time Calculation (min) 45 min   ? Equipment Utilized During Treatment Gait belt   ? Activity Tolerance Patient tolerated treatment well   ? Behavior During Therapy [facility] for tasks assessed/performed   ? ?  ?  ? ?  ? ? ?Past Medical History:  ?Diagnosis Date  ? Actinic keratosis   ? Atypical mole 04/18/2020  ? R temple Atypical Letiginous melanocytic proliferation with regression, clear post biopsy  ? Basal cell carcinoma 04/18/2020  ? Right mid upper forehead, MOHs 06/22/20  ? 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[place])   ? Dyslipidemia   ? Edema   ? Gastric ulcer   ? OSA on CPAP   ? Prediabetes   ? Pulmonary embolism ([place])   ? Seasonal allergies   ? ? ?Past Surgical History:  ?Procedure Laterality Date  ? ENDARTERECTOMY  10/2018  ? Pulmonary endarterectomy for CTEPH - [facility]  ? PULMONARY VENOGRAPHY Bilateral 01/08/2018  ? Procedure: PULMONARY VENOGRAPHY possible thrombolysis;  Surgeon: Katha Cabal, MD;  Location: [facility];  Service: Cardiovascular;  Laterality: Bilateral;  ? TONSILLECTOMY    ? ? ?There were no vitals filed for this visit. ? ? Subjective Assessment - 03/12/22 0933   ? ? Subjective Pt reports his left knee was hurting following retro ambulation on the treadmill. Pt reports he gets knee injections about once per year and got last one done last year in June and thinks the time for another may be close.   ?  Pertinent History Pt presents to PT with reports of deconditioning/decreased activity tolerance, balance and strength. Pt reports he noticed a decline 3 years ago following surgical procedure to remove clots from his lungs. Pt did attend pulmonary rehab following procedure but continues to feel somewhat short of breath with activities. Pt reports he has noticed his gait speed has slowed and that his UEs and LEs are weaker. He does report lymphedema in LLE. He states ?I have two bad knees,? but denies pain. He reports unsteadiness with gait and FOF with ascending/descending curbs. Pt endorses difficulty standing up from the floor. The pt reports no falls in past 6 months. He does not use an AD. Other PMH significant for the following: hx of surgical procedure to remove clots in lungs (per pt report 3 years ago), PE, OSA, lymphedema, chronic thromboembolic pulmonary hypertension, DOE, PVD.   ? Limitations Sitting;Walking;Lifting;House hold activities;Standing   ? How long can you sit comfortably? Pt reports interrupted after an hour due to needing to use restroom (on lasix)   ? How long can you stand comfortably? <15 minutes due to fatigue   ? How long can you walk comfortably? Pt reports he can walk 20-30 minutes without stopping, but slowly   ? Diagnostic tests none recent   ? Patient Stated Goals Pt would like to improve his balance and his walking ability   ?  Currently in Pain? Yes   ? Pain Location Knee   ? Pain Orientation Left   ? Pain Descriptors / Indicators Aching   ? Pain Onset More than a month ago   ? ?  ?  ? ?  ? ?TREATMENT: ? ?Therex- ? ?LAQ with 4# ankle weights 10x 2-3 second holds, 2 sets; pt rates as medium ? ?Marches 20x 2 sets each LE with 4# ankle weights. Pt rates as medium, B UE assist ?  ?STS -2*10  with airex pad on chair to enable pt to complete without use of hands and with out c/o knee pain  ? ?Standing HS curls 2 x 10 ea - UE A ? ? ?NMR ?semi-tandem stance with ant LE on compliant pad 2 x  45 sec ea side  ?- to improve balance and efficacy with larger step length  ? ?Step-length intervention over 10 meters: cuing throughout for increased B step length, heel-toe sequencing ?19 steps ?18 steps ?19 steps ?18 steps ?-last few steps were very large but pt had some signs of imbalance with large steps ? ?Pt educated throughout session about proper posture and technique with exercises. Improved exercise technique, movement at target joints, use of target muscles after min to mod verbal, visual, tactile cues. ? ?Pt required occasional rest breaks due fatigue, PT was quick to ask when pt appeared to be fatiguing in order to prevent excessive fatigue. ? ?  ? ? ? ? ? ? ? ? ? ? ? ? ? ? ? ? ? ? ? ? ? ? ? ? ? ? ? PT Education - 03/12/22 1029   ? ? Education Details exercise technique and purpose   ? Person(s) Educated Patient   ? Methods Explanation;Demonstration   ? Comprehension Verbalized understanding;Returned demonstration   ? ?  ?  ? ?  ? ? ? PT Short Term Goals - 02/15/22 0830   ? ?  ? PT SHORT TERM GOAL #1  ? Title Patient will be independent in home exercise program to improve strength/mobility for better functional independence with ADLs.   ? Baseline 2/23: to be issued next 1-2 sessions   ? Time 6   ? Period Weeks   ? Status New   ? Target Date 03/29/22   ? ?  ?  ? ?  ? ? ? ? PT Long Term Goals - 02/20/22 0902   ? ?  ? PT LONG TERM GOAL #1  ? Title Patient will increase FOTO score to equal to or greater than 76  to demonstrate statistically significant improvement in mobility and quality of life.   ? Baseline 2/23: 75   ? Time 12   ? Period Weeks   ? Status New   ? Target Date 05/10/22   ?  ? PT LONG TERM GOAL #2  ? Title Patient will increase six minute walk test distance to >1000 for progression to community ambulator and improve gait ability   ? Baseline 2/23: 720 ft   ? Time 12   ? Period Weeks   ? Status New   ? Target Date 05/10/22   ?  ? PT LONG TERM GOAL #3  ? Title Patient will increase 10 meter  walk test to >1.7ms as to improve gait speed for better community ambulation and to reduce fall risk.   ? Baseline 2/23: 0.8 m/s   ? Time 12   ? Period Weeks   ? Status New   ? Target Date  05/10/22   ?  ? PT LONG TERM GOAL #4  ? Title Patient will tolerate 5 seconds of single leg stance without loss of balance to improve ability to get in and out of shower safely.   ? Baseline 2/23: unable to maintain 1 sec on each LE   ? Time 12   ? Period Weeks   ? Status New   ? Target Date 05/10/22   ?  ? PT LONG TERM GOAL #5  ? Title Patient will increase Berg Balance score by > 6 points to demonstrate decreased fall risk during functional activities.   ? Baseline 2/23: deferred to future date d/t time; 2/28: 48/56   ? Time 12   ? Period Weeks   ? Status New   ? Target Date 05/10/22   ? ?  ?  ? ?  ? ? ? ? ? ? ? ? Plan - 03/12/22 0952   ? ? Clinical Impression Statement Pt responded well to physical therapy interventions this session and demonstrated goo dmotivation for copmletion of PT exercises. Pt progressed with several exercsies to standing this session.  Patient demonstrated similar gait step length today with stepping game performed on 10 m walk test area.  Patient did demonstrate some imbalance with longer steps when attempting these interventions were added in order to address this.  Patient will continue to benefit from skilled physical therapy intervention in order to improve his lower extremity strength, endurance, balance, mobility, and overall quality of life.   ? Personal Factors and Comorbidities Age;Comorbidity 3+   ? Comorbidities hx of surgical procedure to remove clots in lungs, PE, OSA, lymphedema,  (chronic thromboembolic pulmonary hypertension, DOE, PVD   ? Examination-Activity Limitations Bend;Lift;Locomotion Level;Reach Overhead;Sit;Squat;Stairs;Stand;Transfers   ? Examination-Participation Restrictions Community Activity;Yard Work;Shop;Cleaning   ? Stability/Clinical Decision Making  Stable/Uncomplicated   ? Rehab Potential Good   ? PT Frequency 2x / week   ? PT Duration 12 weeks   ? PT Treatment/Interventions ADLs/Self Care Home Management;Aquatic Therapy;Canalith Repostioning;Biofeedback;Cryotherapy;Ele

## 2022-03-14 ENCOUNTER — Ambulatory Visit: Payer: Medicare Other

## 2022-03-20 ENCOUNTER — Other Ambulatory Visit: Payer: Self-pay

## 2022-03-20 ENCOUNTER — Ambulatory Visit: Payer: Medicare Other

## 2022-03-20 DIAGNOSIS — R2681 Unsteadiness on feet: Secondary | ICD-10-CM

## 2022-03-20 DIAGNOSIS — R2689 Other abnormalities of gait and mobility: Secondary | ICD-10-CM

## 2022-03-20 DIAGNOSIS — R278 Other lack of coordination: Secondary | ICD-10-CM

## 2022-03-20 DIAGNOSIS — M6281 Muscle weakness (generalized): Secondary | ICD-10-CM

## 2022-03-20 NOTE — Therapy (Signed)
[facility] MAIN [facility] SERVICES [address] [place], [place], [address] Phone: [phone]   Fax:  [phone]  Physical Therapy Treatment  Patient Details  Name: Adam Knight MRN: [id number] Date of Birth: May 16, 1941 Referring Provider (PT): End, Cristal Deer, MD   Encounter Date: 03/20/2022   PT End of Session - 03/20/22 1426     Visit Number 8    Number of Visits 25    Date for PT Re-Evaluation 05/10/22    Authorization Time Period 02/15/2022-05/10/2022    Progress Note Due on Visit 10    PT Start Time 1430    PT Stop Time 1514    PT Time Calculation (min) 44 min    Equipment Utilized During Treatment Gait belt    Activity Tolerance Patient tolerated treatment well    Behavior During Therapy [facility] for tasks assessed/performed             Past Medical History:  Diagnosis Date   Actinic keratosis    Atypical mole 04/18/2020   R temple Atypical Letiginous melanocytic proliferation with regression, clear post biopsy   Basal cell carcinoma 04/18/2020   Right mid upper forehead, MOHs 06/22/20   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HCC)    Dyslipidemia    Edema    Gastric ulcer    OSA on CPAP    Prediabetes    Pulmonary embolism (HCC)    Seasonal allergies     Past Surgical History:  Procedure Laterality Date   ENDARTERECTOMY  10/2018   Pulmonary endarterectomy for CTEPH - [facility]   PULMONARY VENOGRAPHY Bilateral 01/08/2018   Procedure: PULMONARY VENOGRAPHY possible thrombolysis;  Surgeon: Renford Dills, MD;  Location: [facility] INVASIVE CV LAB;  Service: Cardiovascular;  Laterality: Bilateral;   TONSILLECTOMY      There were no vitals filed for this visit.   Subjective Assessment - 03/20/22 1426     Subjective Patient reports doing about the same. States his knees are sore and he was outside working in his yard- denies any falls.    Pertinent History Pt presents to PT with reports of  deconditioning/decreased activity tolerance, balance and strength. Pt reports he noticed a decline 3 years ago following surgical procedure to remove clots from his lungs. Pt did attend pulmonary rehab following procedure but continues to feel somewhat short of breath with activities. Pt reports he has noticed his gait speed has slowed and that his UEs and LEs are weaker. He does report lymphedema in LLE. He states "I have two bad knees," but denies pain. He reports unsteadiness with gait and FOF with ascending/descending curbs. Pt endorses difficulty standing up from the floor. The pt reports no falls in past 6 months. He does not use an AD. Other PMH significant for the following: hx of surgical procedure to remove clots in lungs (per pt report 3 years ago), PE, OSA, lymphedema, chronic thromboembolic pulmonary hypertension, DOE, PVD.    Limitations Sitting;Walking;Lifting;House hold activities;Standing    How long can you sit comfortably? Pt reports interrupted after an hour due to needing to use restroom (on lasix)    How long can you stand comfortably? <15 minutes due to fatigue    How long can you walk comfortably? Pt reports he can walk 20-30 minutes without stopping, but slowly    Diagnostic tests none recent    Patient Stated Goals Pt would like to improve his balance and his walking ability    Pain Onset More than a month ago  Pain Frequency Intermittent              INTERVENTIONS:    Therex:   Seated ham curl- GTB x 12 reps x 2 sets- Patient reports as medium  Sit to stand x 12 reps x 2 sets -with airex pad on chair to enable pt to complete without use of hands and with out c/o knee pain   Step tap onto step stool with BUE Support 2 x 12 reps- Patient reports as easy Step up onto step stool with BUE support x 12 reps- Patient reports as harder  NMR: at support bar with use of gait belt and CGA  Side Step up and over 1/2 foam x 12 reps 1 UE support- VC to take a wide step.  Patient presents with difficulty achieving wide enough step to be compliant but did improve with practice.  Staggered standing on airex pad x 20-30 sec at 3 sets each without UE Support- Using mostly ankle righting strategy.   Static marching in place on airex pad- x 15 reps with intermittent reaching for support bar.   Education provided throughout session via VC/TC and demonstration to facilitate movement at target joints and correct muscle activation for all testing and exercises performed.                          PT Education - 03/20/22 2033     Education Details Exercise technique    Person(s) Educated Patient    Methods Explanation;Demonstration    Comprehension Verbalized understanding;Returned demonstration              PT Short Term Goals - 02/15/22 0830       PT SHORT TERM GOAL #1   Title Patient will be independent in home exercise program to improve strength/mobility for better functional independence with ADLs.    Baseline 2/23: to be issued next 1-2 sessions    Time 6    Period Weeks    Status New    Target Date 03/29/22               PT Long Term Goals - 02/20/22 0902       PT LONG TERM GOAL #1   Title Patient will increase FOTO score to equal to or greater than 76  to demonstrate statistically significant improvement in mobility and quality of life.    Baseline 2/23: 75    Time 12    Period Weeks    Status New    Target Date 05/10/22      PT LONG TERM GOAL #2   Title Patient will increase six minute walk test distance to >1000 for progression to community ambulator and improve gait ability    Baseline 2/23: 720 ft    Time 12    Period Weeks    Status New    Target Date 05/10/22      PT LONG TERM GOAL #3   Title Patient will increase 10 meter walk test to >1.21m/s as to improve gait speed for better community ambulation and to reduce fall risk.    Baseline 2/23: 0.8 m/s    Time 12    Period Weeks    Status New     Target Date 05/10/22      PT LONG TERM GOAL #4   Title Patient will tolerate 5 seconds of single leg stance without loss of balance to improve ability to get in and out of shower  safely.    Baseline 2/23: unable to maintain 1 sec on each LE    Time 12    Period Weeks    Status New    Target Date 05/10/22      PT LONG TERM GOAL #5   Title Patient will increase Berg Balance score by > 6 points to demonstrate decreased fall risk during functional activities.    Baseline 2/23: deferred to future date d/t time; 2/28: 48/56    Time 12    Period Weeks    Status New    Target Date 05/10/22                   Plan - 03/20/22 1427     Clinical Impression Statement Patient presents with good motivation for today's session. He was able to participate in more standing activities today without report of any increased knee pain. He was challenged with steps and later static standing balance with feet staggered. Patient will continue to benefit from skilled physical therapy intervention in order to improve his lower extremity strength, endurance, balance, mobility, and overall quality of life.    Personal Factors and Comorbidities Age;Comorbidity 3+    Comorbidities hx of surgical procedure to remove clots in lungs, PE, OSA, lymphedema,  (chronic thromboembolic pulmonary hypertension, DOE, PVD    Examination-Activity Limitations Bend;Lift;Locomotion Level;Reach Overhead;Sit;Squat;Stairs;Stand;Transfers    Examination-Participation Restrictions Community Activity;Yard Work;Shop;Cleaning    Stability/Clinical Decision Making Stable/Uncomplicated    Rehab Potential Good    PT Frequency 2x / week    PT Duration 12 weeks    PT Treatment/Interventions ADLs/Self Care Home Management;Aquatic Therapy;Canalith Repostioning;Biofeedback;Cryotherapy;Electrical Stimulation;Iontophoresis 4mg /ml Dexamethasone;Moist Heat;Traction;Parrafin;Contrast Bath;DME Instruction;Gait training;Stair training;Functional  mobility training;Therapeutic activities;Therapeutic exercise;Balance training;Neuromuscular re-education;Patient/family education;Orthotic Fit/Training;Manual techniques;Wheelchair mobility training;Passive range of motion;Energy conservation;Splinting;Taping;Vestibular;Vasopneumatic Device;Visual/perceptual remediation/compensation;Spinal Manipulations;Joint Manipulations    PT Next Visit Plan continue balance with longer stride length continue to progress standing therex    PT Home Exercise Plan Access Code: [email or web address]; no updates    Consulted and Agree with Plan of Care Patient             Patient will benefit from skilled therapeutic intervention in order to improve the following deficits and impairments:  Abnormal gait, Decreased activity tolerance, Decreased endurance, Decreased range of motion, Decreased strength, Hypomobility, Improper body mechanics, Decreased balance, Decreased mobility, Difficulty walking, Increased edema, Impaired flexibility, Postural dysfunction  Visit Diagnosis: Other abnormalities of gait and mobility  Muscle weakness (generalized)  Unsteadiness on feet  Other lack of coordination     Problem List 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   Mild cognitive impairment 05/08/2018   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HCC) 05/02/2018   Chest pain 05/02/2018   Palpitations 05/02/2018   Long term (current) use of  anticoagulants 02/18/2018   Pulmonary embolus (HCC) 01/06/2018   Primary osteoarthritis of both knees 11/10/2017   Lymphedema of both lower extremities 10/25/2017   Hyperlipidemia, mixed 06/01/2016   Mild depression 06/01/2016   Dyspnea on exertion 03/01/2016  Non morbid obesity due to excess calories 03/01/2016   PVD (peripheral vascular disease) (HCC) 03/01/2016   Primary osteoarthritis of left knee 04/20/2015   Primary osteoarthritis of right knee 04/20/2015   BMI 40.0-44.9, adult (HCC) 04/20/2015   DOE (dyspnea on exertion) 01/26/2015   OSA on CPAP 01/26/2015   Morbid obesity (HCC) 01/26/2015   Chronic edema 01/13/2015   Hyperglycemia, unspecified 01/13/2015   Nephrolithiasis 01/13/2015   Obstructive sleep apnea 05/08/2013    Lenda Kelp, PT 03/21/2022, 9:20 AM  [facility] MAIN [facility] SERVICES [address] [place], [place], [address] Phone: [phone]   Fax:  [phone]  Name: Adam Knight MRN: [id number] Date of Birth: 06-02-1941

## 2022-03-23 ENCOUNTER — Ambulatory Visit: Payer: Medicare Other | Admitting: Physical Therapy

## 2022-03-23 ENCOUNTER — Encounter: Payer: Self-pay | Admitting: Physical Therapy

## 2022-03-23 DIAGNOSIS — R2681 Unsteadiness on feet: Secondary | ICD-10-CM | POA: Diagnosis not present

## 2022-03-23 DIAGNOSIS — R278 Other lack of coordination: Secondary | ICD-10-CM

## 2022-03-23 DIAGNOSIS — M6281 Muscle weakness (generalized): Secondary | ICD-10-CM

## 2022-03-23 DIAGNOSIS — R2689 Other abnormalities of gait and mobility: Secondary | ICD-10-CM

## 2022-03-23 NOTE — Therapy (Signed)
[place] [facility] MAIN REHAB SERVICES ?[place], [place], [address] ?Phone: [phone]   Fax:  [phone] ? ?Physical Therapy Treatment ? ?Patient Details  ?Name: Adam Knight ?MRN: [id number] ?Date of Birth: 01-Jan-1941 ?Referring Provider (PT): End, Harrell Gave, MD ? ? ?Encounter Date: 03/23/2022 ? ? PT End of Session - 03/23/22 0921   ? ? Visit Number 9   ? Number of Visits 25   ? Date for PT Re-Evaluation 05/10/22   ? Authorization Time Period 02/15/2022-05/10/2022   ? Progress Note Due on Visit 10   ? PT Start Time [phone]   ? PT Stop Time 0959   ? PT Time Calculation (min) 43 min   ? Equipment Utilized During Treatment Gait belt   ? Activity Tolerance Patient tolerated treatment well   ? Behavior During Therapy [facility] for tasks assessed/performed   ? ?  ?  ? ?  ? ? ?Past Medical History:  ?Diagnosis Date  ? Actinic keratosis   ? Atypical mole 04/18/2020  ? R temple Atypical Letiginous melanocytic proliferation with regression, clear post biopsy  ? Basal cell carcinoma 04/18/2020  ? Right mid upper forehead, MOHs 06/22/20  ? 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[place])   ? Dyslipidemia   ? Edema   ? Gastric ulcer   ? OSA on CPAP   ? Prediabetes   ? Pulmonary embolism ([place])   ? Seasonal allergies   ? ? ?Past Surgical History:  ?Procedure Laterality Date  ? ENDARTERECTOMY  10/2018  ? Pulmonary endarterectomy for CTEPH - [facility]  ? PULMONARY VENOGRAPHY Bilateral 01/08/2018  ? Procedure: PULMONARY VENOGRAPHY possible thrombolysis;  Surgeon: Katha Cabal, MD;  Location: [place] CV LAB;  Service: Cardiovascular;  Laterality: Bilateral;  ? TONSILLECTOMY    ? ? ?There were no vitals filed for this visit. ? ? Subjective Assessment - 03/23/22 0918   ? ? Subjective States his knees are sore and he was outside working in his yard. Pt reports his left knee has been hot and swelling some, reports soaking in epsom salts has been helpful today.   ? Pertinent History  Pt presents to PT with reports of deconditioning/decreased activity tolerance, balance and strength. Pt reports he noticed a decline 3 years ago following surgical procedure to remove clots from his lungs. Pt did attend pulmonary rehab following procedure but continues to feel somewhat short of breath with activities. Pt reports he has noticed his gait speed has slowed and that his UEs and LEs are weaker. He does report lymphedema in LLE. He states ?I have two bad knees,? but denies pain. He reports unsteadiness with gait and FOF with ascending/descending curbs. Pt endorses difficulty standing up from the floor. The pt reports no falls in past 6 months. He does not use an AD. Other PMH significant for the following: hx of surgical procedure to remove clots in lungs (per pt report 3 years ago), PE, OSA, lymphedema, chronic thromboembolic pulmonary hypertension, DOE, PVD.   ? Limitations Sitting;Walking;Lifting;House hold activities;Standing   ? How long can you sit comfortably? Pt reports interrupted after an hour due to needing to use restroom (on lasix)   ? How long can you stand comfortably? <15 minutes due to fatigue   ? How long can you walk comfortably? Pt reports he can walk 20-30 minutes without stopping, but slowly   ? Diagnostic tests none recent   ? Patient Stated Goals Pt would like to improve his balance and his walking  ability   ? Currently in Pain? Yes   ? Pain Score 6    ? Pain Location Knee   ? Pain Orientation Left   ? Pain Descriptors / Indicators Aching   ? Pain Type Chronic pain   ? Pain Onset More than a month ago   ? Pain Frequency Intermittent   ? ?  ?  ? ?  ? ? ? ?INTERVENTIONS:  ? ? ?Therex:  ? ?Seated ham curl- GTB x 12 reps x 2 sets- Patient reports as medium ? ?Sit to stand x 8 reps x 2 sets -with airex pad on chair to enable pt to complete without use of hands  ?-Patient did have increase in knee pain following this activity, with rest knee pain was alleviated. ? ?Long arc quad 2 sets of  12 repetitions with 4 pound ankle weight ?-No increase in knee pain noted ? ? ? ?NMR: at support bar with use of gait belt and CGA ? ?SLS progression 1 LE airex 1 LE on step  ?2 x 35 sec ea side  ? ?Adducted stance with head turns  ?2x 10 turns lateral and superior/ inferior head turns  ? ?Lateral step taps to 6 inch step up from Airex pad without upper extremity assist. ?-X8 to each side ?-Increased difficulty with left lower extremity is stance lower extremity.  Intermittent upper extremity assist to prevent loss of balance. ? ?Education provided throughout session via VC/TC and demonstration to facilitate movement at target joints and correct muscle activation for all testing and exercises performed.  ? ?Note: Portions of this document were prepared using Dragon voice recognition software and although reviewed may contain unintentional dictation errors in syntax, grammar, or spelling. ? ?Pt required occasional rest breaks due fatigue, PT was quick to ask when pt appeared to be fatiguing in order to prevent excessive fatigue and increased knee soreness/ pain.  ? ? ? ? ? ? ? ? ? ? ? ? ? ? ? ? ? ? ? ? ? ? ? ? ? ? ? PT Education - 03/23/22 0920   ? ? Education Details exercise technique   ? Person(s) Educated Patient   ? Methods Explanation   ? Comprehension Verbalized understanding   ? ?  ?  ? ?  ? ? ? PT Short Term Goals - 02/15/22 0830   ? ?  ? PT SHORT TERM GOAL #1  ? Title Patient will be independent in home exercise program to improve strength/mobility for better functional independence with ADLs.   ? Baseline 2/23: to be issued next 1-2 sessions   ? Time 6   ? Period Weeks   ? Status New   ? Target Date 03/29/22   ? ?  ?  ? ?  ? ? ? ? PT Long Term Goals - 02/20/22 0902   ? ?  ? PT LONG TERM GOAL #1  ? Title Patient will increase FOTO score to equal to or greater than 76  to demonstrate statistically significant improvement in mobility and quality of life.   ? Baseline 2/23: 75   ? Time 12   ? Period Weeks   ?  Status New   ? Target Date 05/10/22   ?  ? PT LONG TERM GOAL #2  ? Title Patient will increase six minute walk test distance to >1000 for progression to community ambulator and improve gait ability   ? Baseline 2/23: 720 ft   ? Time 12   ?  Period Weeks   ? Status New   ? Target Date 05/10/22   ?  ? PT LONG TERM GOAL #3  ? Title Patient will increase 10 meter walk test to >1.35ms as to improve gait speed for better community ambulation and to reduce fall risk.   ? Baseline 2/23: 0.8 m/s   ? Time 12   ? Period Weeks   ? Status New   ? Target Date 05/10/22   ?  ? PT LONG TERM GOAL #4  ? Title Patient will tolerate 5 seconds of single leg stance without loss of balance to improve ability to get in and out of shower safely.   ? Baseline 2/23: unable to maintain 1 sec on each LE   ? Time 12   ? Period Weeks   ? Status New   ? Target Date 05/10/22   ?  ? PT LONG TERM GOAL #5  ? Title Patient will increase Berg Balance score by > 6 points to demonstrate decreased fall risk during functional activities.   ? Baseline 2/23: deferred to future date d/t time; 2/28: 48/56   ? Time 12   ? Period Weeks   ? Status New   ? Target Date 05/10/22   ? ?  ?  ? ?  ? ? ? ? ? ? ? ? Plan - 03/23/22 1028   ? ? Clinical Impression Statement Patient presents with good motivation for completion of physical therapy session.  Physical therapist did limit several standing activities due to patient's increase in left knee pain at beginning of session.  Patient reported minimal increase in knee pain with sit to stand activity with did not report any increase in knee pain with other activities.  Patient progressed with several high intensity static and dynamic balance activities and overall tolerated well without loss of balance.  Patient will continue to benefit from further progressive skilled physical therapy interventions in order to improve his lower extremity strength, balance, prevent falls, and improve his overall mobility.   ? Personal Factors  and Comorbidities Age;Comorbidity 3+   ? Comorbidities hx of surgical procedure to remove clots in lungs, PE, OSA, lymphedema,  (chronic thromboembolic pulmonary hypertension, DOE, PVD   ? Examinati

## 2022-03-27 ENCOUNTER — Ambulatory Visit: Payer: Medicare Other | Attending: Internal Medicine

## 2022-03-27 DIAGNOSIS — M6281 Muscle weakness (generalized): Secondary | ICD-10-CM | POA: Insufficient documentation

## 2022-03-27 DIAGNOSIS — R278 Other lack of coordination: Secondary | ICD-10-CM | POA: Insufficient documentation

## 2022-03-27 DIAGNOSIS — R262 Difficulty in walking, not elsewhere classified: Secondary | ICD-10-CM | POA: Diagnosis present

## 2022-03-27 DIAGNOSIS — G8929 Other chronic pain: Secondary | ICD-10-CM | POA: Diagnosis present

## 2022-03-27 DIAGNOSIS — M25562 Pain in left knee: Secondary | ICD-10-CM | POA: Insufficient documentation

## 2022-03-27 DIAGNOSIS — R2681 Unsteadiness on feet: Secondary | ICD-10-CM | POA: Insufficient documentation

## 2022-03-27 DIAGNOSIS — R2689 Other abnormalities of gait and mobility: Secondary | ICD-10-CM | POA: Diagnosis present

## 2022-03-27 NOTE — Therapy (Signed)
[facility] ?[place], [place], [address] ?Phone: [phone]   Fax:  [phone] ? ?Physical Therapy Treatment/Physical Therapy Progress Note ?Visit 10 ? ? ?Dates of reporting period  02/15/2022   to   03/27/2022 ? ? ?Patient Details  ?Name: Adam Knight ?MRN: [id number] ?Date of Birth: 1941/09/20 ?Referring Provider (PT): End, Harrell Gave, MD ? ? ?Encounter Date: 03/27/2022 ? ? PT End of Session - 03/27/22 1507   ? ? Visit Number 10   ? Number of Visits 25   ? Date for PT Re-Evaluation 05/10/22   ? Authorization Time Period 02/15/2022-05/10/2022   ? Progress Note Due on Visit 10   ? PT Start Time [phone]   ? PT Stop Time 1000   ? PT Time Calculation (min) 39 min   ? Equipment Utilized During Treatment Gait belt   ? Activity Tolerance Patient tolerated treatment well   ? Behavior During Therapy [facility] for tasks assessed/performed   ? ?  ?  ? ?  ? ? ?Past Medical History:  ?Diagnosis Date  ? Actinic keratosis   ? Atypical mole 04/18/2020  ? R temple Atypical Letiginous melanocytic proliferation with regression, clear post biopsy  ? Basal cell carcinoma 04/18/2020  ? Right mid upper forehead, MOHs 06/22/20  ? 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 Dyslipidemia   ? Edema   ? Gastric ulcer   ? OSA on CPAP   ? Prediabetes   ? Pulmonary embolism ([place])   ? Seasonal allergies   ? ? ?Past Surgical History:  ?Procedure Laterality Date  ? ENDARTERECTOMY  10/2018  ? Pulmonary endarterectomy for CTEPH - [facility]  ? PULMONARY VENOGRAPHY Bilateral 01/08/2018  ? Procedure: PULMONARY VENOGRAPHY possible thrombolysis;  Surgeon: Katha Cabal, MD;  Location: [facility];  Service: Cardiovascular;  Laterality: Bilateral;  ? TONSILLECTOMY    ? ? ?There were no vitals filed for this visit. ? ? Subjective Assessment - 03/27/22 0924   ? ? Subjective Pt reports L knee and L foot soreness. Pt reports he used a cold bag of corn applied to L knee and  that reduced his symptoms. Pt reports no stumbles/falls.   ? Pertinent History Pt presents to PT with reports of deconditioning/decreased activity tolerance, balance and strength. Pt reports he noticed a decline 3 years ago following surgical procedure to remove clots from his lungs. Pt did attend pulmonary rehab following procedure but continues to feel somewhat short of breath with activities. Pt reports he has noticed his gait speed has slowed and that his UEs and LEs are weaker. He does report lymphedema in LLE. He states ?I have two bad knees,? but denies pain. He reports unsteadiness with gait and FOF with ascending/descending curbs. Pt endorses difficulty standing up from the floor. The pt reports no falls in past 6 months. He does not use an AD. Other PMH significant for the following: hx of surgical procedure to remove clots in lungs (per pt report 3 years ago), PE, OSA, lymphedema, chronic thromboembolic pulmonary hypertension, DOE, PVD.   ? Limitations Sitting;Walking;Lifting;House hold activities;Standing   ? How long can you sit comfortably? Pt reports interrupted after an hour due to needing to use restroom (on lasix)   ? How long can you stand comfortably? <15 minutes due to fatigue   ? How long can you walk comfortably? Pt reports he can walk 20-30 minutes without stopping, but slowly   ? Diagnostic tests none  recent   ? Patient Stated Goals Pt would like to improve his balance and his walking ability   ? Currently in Pain? Yes   ? Pain Location Knee   ? Pain Orientation Left   ? Pain Onset More than a month ago   ? ?  ?  ? ?  ? ? ? ? ? [facility] PT Assessment - 03/27/22 0001   ? ?  ? Berg Balance Test  ? Sit to Stand Able to stand without using hands and stabilize independently   ? Standing Unsupported Able to stand safely 2 minutes   ? Sitting with Back Unsupported but Feet Supported on Floor or Stool Able to sit safely and securely 2 minutes   ? Stand to Sit Sits safely with minimal use of hands   ?  Transfers Able to transfer safely, minor use of hands   ? Standing Unsupported with Eyes Closed Able to stand 10 seconds safely   ? Standing Unsupported with Feet Together Able to place feet together independently and stand 1 minute safely   ? From Standing, Reach Forward with Outstretched Arm Can reach forward >12 cm safely (5")   ? From Standing Position, Pick up Object from [place] to pick up shoe safely and easily   ? From Standing Position, Turn to Look Behind Over each Shoulder Looks behind from both sides and weight shifts well   ? Turn 360 Degrees Able to turn 360 degrees safely one side only in 4 seconds or less   ? Standing Unsupported, Alternately Place Feet on Step/Stool Able to stand independently and complete 8 steps >20 seconds   ? Standing Unsupported, One Foot in Front Able to plae foot ahead of the other independently and hold 30 seconds   ? Standing on One Leg Tries to lift leg/unable to hold 3 seconds but remains standing independently   ? Total Score 49   ? ?  ?  ? ?  ? ? ? ? ?INTERVENTIONS - session consisted of retesting goals for progress note. Please see goal section for details. ? ?PT instructed pt in importance of performing his HEP in order to make gains in therapy, as pt reports he has not been consistently performing HEP. ? ?PT updated HEP and instructed pt in the following- ? ?Access Code: [email or web address] ?URL: [email or web address] ?Date: 03/27/2022 ?Prepared by: Ricard Dillon ? ?Exercises ?- Standing Single Leg Stance with Counter Support  - 1 x daily - 7 x weekly - 1 sets - 2 reps - 30 hold ? ? ?Pt educated throughout session about proper posture and technique with exercises. Improved exercise technique, movement at target joints, use of target muscles after min to mod verbal, visual, tactile cues. ? ? ? ? ? PT Education - 03/27/22 1506   ? ? Education Details goals, importance of performing HEP   ? Person(s) Educated Patient   ? Methods Explanation;Demonstration;Verbal cues    ? Comprehension Verbalized understanding;Returned demonstration;Need further instruction   ? ?  ?  ? ?  ? ? ? PT Short Term Goals - 03/27/22 1510   ? ?  ? PT SHORT TERM GOAL #1  ? Title Patient will be independent in home exercise program to improve strength/mobility for better functional independence with ADLs.   ? Baseline 2/23: to be issued next 1-2 sessions; 4/4: pt reports he has not been performing his HEP and feels too fatigued to do it after his usual activities. PT updates HEP and instructs pt in  importance of performing HEP   ? Time 6   ? Period Weeks   ? Status On-going   ? Target Date 05/08/22   ? ?  ?  ? ?  ? ? ? ? PT Long Term Goals - 03/27/22 0927   ? ?  ? PT LONG TERM GOAL #1  ? Title Patient will increase FOTO score to equal to or greater than 76  to demonstrate statistically significant improvement in mobility and quality of life.   ? Baseline 2/23: 75 4/4:   ? Time 12   ? Period Weeks   ? Status New   ? Target Date 05/10/22   ?  ? PT LONG TERM GOAL #2  ? Title Patient will increase six minute walk test distance to >1000 for progression to community ambulator and improve gait ability   ? Baseline 2/23: 720 ft; 4/4" 697 ft, pt reports L knee pain during test   ? Time 12   ? Period Weeks   ? Status On-going   ? Target Date 05/10/22   ?  ? PT LONG TERM GOAL #3  ? Title Patient will increase 10 meter walk test to >1.28ms as to improve gait speed for better community ambulation and to reduce fall risk.   ? Baseline 2/23: 0.8 m/s; 4/4: 0.72 m/s   ? Time 12   ? Period Weeks   ? Status On-going   ? Target Date 05/10/22   ?  ? PT LONG TERM GOAL #4  ? Title Patient will tolerate 5 seconds of single leg stance without loss of balance to improve ability to get in and out of shower safely.   ? Baseline 2/23: unable to maintain 1 sec on each LE; 4/4 pt unable to maintain 1 sec each LE   ? Time 12   ? Period Weeks   ? Status On-going   ? Target Date 05/10/22   ?  ? PT LONG TERM GOAL #5  ? Title Patient will  increase Berg Balance score by > 6 points to demonstrate decreased fall risk during functional activities.   ? Baseline 2/23: deferred to future date d/t time; 2/28: 48/56; 4/4: 49/56   ? Time 12   ? Peri

## 2022-03-29 ENCOUNTER — Ambulatory Visit: Payer: Medicare Other

## 2022-03-29 DIAGNOSIS — M6281 Muscle weakness (generalized): Secondary | ICD-10-CM

## 2022-03-29 DIAGNOSIS — R262 Difficulty in walking, not elsewhere classified: Secondary | ICD-10-CM | POA: Diagnosis not present

## 2022-03-29 DIAGNOSIS — G8929 Other chronic pain: Secondary | ICD-10-CM

## 2022-03-29 NOTE — Therapy (Signed)
[facility] ?[place], [place], [address] ?Phone: [phone]   Fax:  [phone] ? ?Physical Therapy Treatment ? ?Patient Details  ?Name: Adam Knight ?MRN: [id number] ?Date of Birth: Sep 24, 1941 ?Referring Provider (PT): End, Harrell Gave, MD ? ? ?Encounter Date: 03/29/2022 ? ? PT End of Session - 03/29/22 1838   ? ? Visit Number 11   ? Number of Visits 25   ? Date for PT Re-Evaluation 05/10/22   ? Authorization Time Period 02/15/2022-05/10/2022   ? Progress Note Due on Visit 10   ? PT Start Time [phone]   ? PT Stop Time 0926   ? PT Time Calculation (min) 39 min   ? Equipment Utilized During Treatment Gait belt   ? Activity Tolerance Patient tolerated treatment well   ? Behavior During Therapy [facility] for tasks assessed/performed   ? ?  ?  ? ?  ? ? ?Past Medical History:  ?Diagnosis Date  ? Actinic keratosis   ? Atypical mole 04/18/2020  ? R temple Atypical Letiginous melanocytic proliferation with regression, clear post biopsy  ? Basal cell carcinoma 04/18/2020  ? Right mid upper forehead, MOHs 06/22/20  ? 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[place])   ? Dyslipidemia   ? Edema   ? Gastric ulcer   ? OSA on CPAP   ? Prediabetes   ? Pulmonary embolism ([place])   ? Seasonal allergies   ? ? ?Past Surgical History:  ?Procedure Laterality Date  ? ENDARTERECTOMY  10/2018  ? Pulmonary endarterectomy for CTEPH - [facility]  ? PULMONARY VENOGRAPHY Bilateral 01/08/2018  ? Procedure: PULMONARY VENOGRAPHY possible thrombolysis;  Surgeon: Katha Cabal, MD;  Location: [facility];  Service: Cardiovascular;  Laterality: Bilateral;  ? TONSILLECTOMY    ? ? ?There were no vitals filed for this visit. ? ? Subjective Assessment - 03/29/22 0849   ? ? Subjective Pt with increased L knee pain today. He plans to make an appointment for an injection in L knee. Pt reports he typically has this done 1x/year, but feels he needs it sooner due to increased pain.  Despite knee pain, pt does think his balance has improved.   ? Pertinent History Pt presents to PT with reports of deconditioning/decreased activity tolerance, balance and strength. Pt reports he noticed a decline 3 years ago following surgical procedure to remove clots from his lungs. Pt did attend pulmonary rehab following procedure but continues to feel somewhat short of breath with activities. Pt reports he has noticed his gait speed has slowed and that his UEs and LEs are weaker. He does report lymphedema in LLE. He states ?I have two bad knees,? but denies pain. He reports unsteadiness with gait and FOF with ascending/descending curbs. Pt endorses difficulty standing up from the floor. The pt reports no falls in past 6 months. He does not use an AD. Other PMH significant for the following: hx of surgical procedure to remove clots in lungs (per pt report 3 years ago), PE, OSA, lymphedema, chronic thromboembolic pulmonary hypertension, DOE, PVD.   ? Limitations Sitting;Walking;Lifting;House hold activities;Standing   ? How long can you sit comfortably? Pt reports interrupted after an hour due to needing to use restroom (on lasix)   ? How long can you stand comfortably? <15 minutes due to fatigue   ? How long can you walk comfortably? Pt reports he can walk 20-30 minutes without stopping, but slowly   ? Diagnostic tests none recent   ?  Patient Stated Goals Pt would like to improve his balance and his walking ability   ? Currently in Pain? Yes   ? Pain Location Knee   ? Pain Orientation Left   ? Pain Type Chronic pain   ? Pain Onset More than a month ago   ? ?  ?  ? ?  ? ? ?INTERVENTIONS ? ?Seated- ?LAQ - 26 x alternating LE  ?Seated march 20x alternating ?Block supported hamstring stretch 60 sec each LE ?LLE LAQ 10x - no change in pain  ?RTB hamstring curl LLE - 18x. Discontinued due to pain increase ?Comments: pt generally pain limited with interventions. Modifications did not result in decrease in knee  discomfort/pain ? ?Hooklying on plinth with bolster under knees: ?SAQ 10x LLE ?SLR LLE 10x  ?Quad set LLE 10x with 5 sec hold  ?Heel slide LLE 10x 2 sets, TC/VC for decreasing range to perform within pain-free range ?Bolster supported glute bridge 10x2 sets; pt rates challenging  ? ?Pt L knee inspected. Slight swelling noted medial L knee and just superior to patella. Skin is not red. Temperature WNL.  ? ?Manual therapy: ?L patellar medial, lateral, inferior and superior glides performed for x 6 minutes in 20-30 sec bouts. Pt reports pain with inferior glide that does not improve with continuation of glide. Discontinued manual therapy as pt generally reported no improvement felt with pani. ? ?Instructed pt in possibly holding PT if knee pain continues to remain high until he can follow-up with his physician as this is currently limiting his ability to participate in PT. ? ? ?Education provided throughout session via VC/TC and demonstration to facilitate movement at target joints and correct muscle activation for all testing and exercises performed.  ?  ? ? ? ? PT Education - 03/29/22 1837   ? ? Education Details exercise technique, instruction to perform interventions in pain-free range   ? Person(s) Educated Patient   ? Methods Explanation;Demonstration;Tactile cues;Verbal cues   ? Comprehension Verbalized understanding;Returned demonstration;Tactile cues required;Verbal cues required;Need further instruction   ? ?  ?  ? ?  ? ? ? PT Short Term Goals - 03/27/22 1510   ? ?  ? PT SHORT TERM GOAL #1  ? Title Patient will be independent in home exercise program to improve strength/mobility for better functional independence with ADLs.   ? Baseline 2/23: to be issued next 1-2 sessions; 4/4: pt reports he has not been performing his HEP and feels too fatigued to do it after his usual activities. PT updates HEP and instructs pt in importance of performing HEP   ? Time 6   ? Period Weeks   ? Status On-going   ? Target Date  05/08/22   ? ?  ?  ? ?  ? ? ? ? PT Long Term Goals - 03/27/22 0927   ? ?  ? PT LONG TERM GOAL #1  ? Title Patient will increase FOTO score to equal to or greater than 76  to demonstrate statistically significant improvement in mobility and quality of life.   ? Baseline 2/23: 75 4/4: 69   ? Time 12   ? Period Weeks   ? Status New   ? Target Date 05/10/22   ?  ? PT LONG TERM GOAL #2  ? Title Patient will increase six minute walk test distance to >1000 for progression to community ambulator and improve gait ability   ? Baseline 2/23: 720 ft; 4/4" 697 ft, pt reports L knee  pain during test   ? Time 12   ? Period Weeks   ? Status On-going   ? Target Date 05/10/22   ?  ? PT LONG TERM GOAL #3  ? Title Patient will increase 10 meter walk test to >1.31ms as to improve gait speed for better community ambulation and to reduce fall risk.   ? Baseline 2/23: 0.8 m/s; 4/4: 0.72 m/s   ? Time 12   ? Period Weeks   ? Status On-going   ? Target Date 05/10/22   ?  ? PT LONG TERM GOAL #4  ? Title Patient will tolerate 5 seconds of single leg stance without loss of balance to improve ability to get in and out of shower safely.   ? Baseline 2/23: unable to maintain 1 sec on each LE; 4/4 pt unable to maintain 1 sec each LE   ? Time 12   ? Period Weeks   ? Status On-going   ? Target Date 05/10/22   ?  ? PT LONG TERM GOAL #5  ? Title Patient will increase Berg Balance score by > 6 points to demonstrate decreased fall risk during functional activities.   ? Baseline 2/23: deferred to future date d/t time; 2/28: 48/56; 4/4: 49/56   ? Time 12   ? Period Weeks   ? Status On-going   ? Target Date 05/10/22   ? ?  ?  ? ?  ? ? ? ? ? ? ? ? Plan - 03/29/22 1852   ? ? Clinical Impression Statement Interventions regressed to seated AROM and supine strengthening as pt limited due to L knee pain (chronic issue, but reports pain has increased). Pt gait antalgic. Pt noted to have some swelling medial and superior to L patella but skin otherwise WNL. PT  provided manual therapy to LLE, however, this did not improve patient's pain. PT instructed pt in possibly holding PT if knee pain continues to remain high until he can follow-up with his physician as this is currentl

## 2022-04-03 ENCOUNTER — Ambulatory Visit: Payer: Medicare Other

## 2022-04-04 ENCOUNTER — Ambulatory Visit (INDEPENDENT_AMBULATORY_CARE_PROVIDER_SITE_OTHER): Payer: Medicare Other

## 2022-04-04 DIAGNOSIS — Z5181 Encounter for therapeutic drug level monitoring: Secondary | ICD-10-CM | POA: Diagnosis not present

## 2022-04-04 DIAGNOSIS — I2602 Saddle embolus of pulmonary artery with acute cor pulmonale: Secondary | ICD-10-CM

## 2022-04-04 LAB — POCT INR: INR: 2 (ref 2.0–3.0)

## 2022-04-04 NOTE — Patient Instructions (Signed)
-   take extra 1/2 tablet warfarin tonight, then ?- continue dosage of warfarin of 1 tablet (5 mg) every day EXCEPT 1/2 tablet on MONDAYS & FRIDAYS ?- Recheck INR in 4 weeks ?

## 2022-04-05 ENCOUNTER — Ambulatory Visit: Payer: Medicare Other | Admitting: Physical Therapy

## 2022-04-10 ENCOUNTER — Ambulatory Visit: Payer: Medicare Other

## 2022-04-10 DIAGNOSIS — R262 Difficulty in walking, not elsewhere classified: Secondary | ICD-10-CM | POA: Diagnosis not present

## 2022-04-10 DIAGNOSIS — R2681 Unsteadiness on feet: Secondary | ICD-10-CM

## 2022-04-10 DIAGNOSIS — G8929 Other chronic pain: Secondary | ICD-10-CM

## 2022-04-10 DIAGNOSIS — R278 Other lack of coordination: Secondary | ICD-10-CM

## 2022-04-10 DIAGNOSIS — M6281 Muscle weakness (generalized): Secondary | ICD-10-CM

## 2022-04-10 DIAGNOSIS — R2689 Other abnormalities of gait and mobility: Secondary | ICD-10-CM

## 2022-04-10 NOTE — Therapy (Signed)
[facility] ?[place], [place], [address] ?Phone: [phone]   Fax:  [phone] ? ?Physical Therapy Treatment ? ?Patient Details  ?Name: Adam Knight ?MRN: [id number] ?Date of Birth: 18-Sep-1941 ?Referring Provider (PT): End, Harrell Gave, MD ? ? ?Encounter Date: 04/10/2022 ? ? PT End of Session - 04/10/22 1050   ? ? Visit Number 12   ? Number of Visits 25   ? Date for PT Re-Evaluation 05/10/22   ? Authorization Time Period 02/15/2022-05/10/2022   ? Progress Note Due on Visit 10   ? PT Start Time 1014   ? PT Stop Time 1047   ? PT Time Calculation (min) 33 min   ? Equipment Utilized During Treatment Gait belt   ? Activity Tolerance Patient tolerated treatment well   ? Behavior During Therapy [facility] for tasks assessed/performed   ? ?  ?  ? ?  ? ? ?Past Medical History:  ?Diagnosis Date  ? Actinic keratosis   ? Atypical mole 04/18/2020  ? R temple Atypical Letiginous melanocytic proliferation with regression, clear post biopsy  ? Basal cell carcinoma 04/18/2020  ? Right mid upper forehead, MOHs 06/22/20  ? 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[place])   ? Dyslipidemia   ? Edema   ? Gastric ulcer   ? OSA on CPAP   ? Prediabetes   ? Pulmonary embolism ([place])   ? Seasonal allergies   ? ? ?Past Surgical History:  ?Procedure Laterality Date  ? ENDARTERECTOMY  10/2018  ? Pulmonary endarterectomy for CTEPH - [facility]  ? PULMONARY VENOGRAPHY Bilateral 01/08/2018  ? Procedure: PULMONARY VENOGRAPHY possible thrombolysis;  Surgeon: Katha Cabal, MD;  Location: [facility];  Service: Cardiovascular;  Laterality: Bilateral;  ? TONSILLECTOMY    ? ? ?There were no vitals filed for this visit. ? ? Subjective Assessment - 04/10/22 1019   ? ? Subjective Pt reports he did have bilateral knee cortizone injection. States he is still having pain but some better and still recovering.   ? Pertinent History Pt presents to PT with reports of  deconditioning/decreased activity tolerance, balance and strength. Pt reports he noticed a decline 3 years ago following surgical procedure to remove clots from his lungs. Pt did attend pulmonary rehab following procedure but continues to feel somewhat short of breath with activities. Pt reports he has noticed his gait speed has slowed and that his UEs and LEs are weaker. He does report lymphedema in LLE. He states ?I have two bad knees,? but denies pain. He reports unsteadiness with gait and FOF with ascending/descending curbs. Pt endorses difficulty standing up from the floor. The pt reports no falls in past 6 months. He does not use an AD. Other PMH significant for the following: hx of surgical procedure to remove clots in lungs (per pt report 3 years ago), PE, OSA, lymphedema, chronic thromboembolic pulmonary hypertension, DOE, PVD.   ? Limitations Sitting;Walking;Lifting;House hold activities;Standing   ? How long can you sit comfortably? Pt reports interrupted after an hour due to needing to use restroom (on lasix)   ? How long can you stand comfortably? <15 minutes due to fatigue   ? How long can you walk comfortably? Pt reports he can walk 20-30 minutes without stopping, but slowly   ? Diagnostic tests none recent   ? Patient Stated Goals Pt would like to improve his balance and his walking ability   ? Currently in Pain? Yes   ? Pain  Score 4    ? Pain Location Knee   ? Pain Orientation Right;Left;Anterior   ? Pain Descriptors / Indicators Aching;Sore   ? Pain Type Chronic pain   ? Pain Onset More than a month ago   ? Pain Frequency Intermittent   ? ?  ?  ? ?  ? ? ?INTERVENTIONS:  ? ? ?Seated LE strengthening:  ?-Seated ham curl with GTB x 15 reps x BLE ?-Seated hip flex with 3lb. AW alt LE's x 15 reps each ?-Seated knee ext 3lb. AW alt LE's x 15 reps each. ?- Supine Short arc quad 3lb. AW x 15 reps ?- Supine - Hip abd 3lb AW- BLE x 15 reps ?- Supine- heel slides AW - BLE x 15 reps ?- Supine Bridging x 10  reps (VC for correct technique)  ?Education provided throughout session via VC/TC and demonstration to facilitate movement at target joints and correct muscle activation for all testing and exercises performed.  ? ? ? ? ? ? ? ? ? ? ? ? ? ? ? ? ? ? ? ? ? ? ? ? ? ? ? ? ? ? PT Education - 04/10/22 1107   ? ? Education Details Exercise technique   ? Person(s) Educated Patient   ? Methods Explanation;Demonstration;Tactile cues;Verbal cues   ? Comprehension Verbalized understanding;Returned demonstration;Verbal cues required;Tactile cues required;Need further instruction   ? ?  ?  ? ?  ? ? ? PT Short Term Goals - 03/27/22 1510   ? ?  ? PT SHORT TERM GOAL #1  ? Title Patient will be independent in home exercise program to improve strength/mobility for better functional independence with ADLs.   ? Baseline 2/23: to be issued next 1-2 sessions; 4/4: pt reports he has not been performing his HEP and feels too fatigued to do it after his usual activities. PT updates HEP and instructs pt in importance of performing HEP   ? Time 6   ? Period Weeks   ? Status On-going   ? Target Date 05/08/22   ? ?  ?  ? ?  ? ? ? ? PT Long Term Goals - 03/27/22 0927   ? ?  ? PT LONG TERM GOAL #1  ? Title Patient will increase FOTO score to equal to or greater than 76  to demonstrate statistically significant improvement in mobility and quality of life.   ? Baseline 2/23: 75 4/4: 69   ? Time 12   ? Period Weeks   ? Status New   ? Target Date 05/10/22   ?  ? PT LONG TERM GOAL #2  ? Title Patient will increase six minute walk test distance to >1000 for progression to community ambulator and improve gait ability   ? Baseline 2/23: 720 ft; 4/4" 697 ft, pt reports L knee pain during test   ? Time 12   ? Period Weeks   ? Status On-going   ? Target Date 05/10/22   ?  ? PT LONG TERM GOAL #3  ? Title Patient will increase 10 meter walk test to >1.26ms as to improve gait speed for better community ambulation and to reduce fall risk.   ? Baseline 2/23: 0.8  m/s; 4/4: 0.72 m/s   ? Time 12   ? Period Weeks   ? Status On-going   ? Target Date 05/10/22   ?  ? PT LONG TERM GOAL #4  ? Title Patient will tolerate 5 seconds of single leg stance without  loss of balance to improve ability to get in and out of shower safely.   ? Baseline 2/23: unable to maintain 1 sec on each LE; 4/4 pt unable to maintain 1 sec each LE   ? Time 12   ? Period Weeks   ? Status On-going   ? Target Date 05/10/22   ?  ? PT LONG TERM GOAL #5  ? Title Patient will increase Berg Balance score by > 6 points to demonstrate decreased fall risk during functional activities.   ? Baseline 2/23: deferred to future date d/t time; 2/28: 48/56; 4/4: 49/56   ? Time 12   ? Period Weeks   ? Status On-going   ? Target Date 05/10/22   ? ?  ?  ? ?  ? ? ? ? ? ? ? ? Plan - 04/10/22 1050   ? ? Clinical Impression Statement Patient presented with good motivation for today's session. Treatment limited to non-weight bearing LE strengthening due to last weeks bilateral cortizone injection. He was able to participate in prescribed non-weight bearing supine and seated strengthening today without report of increased pain. He was able to return demonstration for correct technique today with Verbal cues and visual demonstration provided-presented with some fatigue yet no report of increased pain. Pt verbalized understanding for all. The pt will benefit from further skilled PT to improve strength, balance, gait and mobility.   ? Personal Factors and Comorbidities Age;Comorbidity 3+   ? Comorbidities hx of surgical procedure to remove clots in lungs, PE, OSA, lymphedema,  (chronic thromboembolic pulmonary hypertension, DOE, PVD   ? Examination-Activity Limitations Bend;Lift;Locomotion Level;Reach Overhead;Sit;Squat;Stairs;Stand;Transfers   ? Examination-Participation Restrictions Community Activity;Yard Work;Shop;Cleaning   ? Stability/Clinical Decision Making Stable/Uncomplicated   ? Rehab Potential Good   ? PT Frequency 2x / week    ? PT Duration 12 weeks   ? PT Treatment/Interventions ADLs/Self Care Home Management;Aquatic Therapy;Canalith Repostioning;Biofeedback;Cryotherapy;Electrical Stimulation;Iontophoresis '4mg'$ /ml Dexamethasone;Moist Heat;Tr

## 2022-04-12 ENCOUNTER — Ambulatory Visit: Payer: Medicare Other | Admitting: Physical Therapy

## 2022-04-17 ENCOUNTER — Ambulatory Visit: Payer: Medicare Other | Admitting: Physical Therapy

## 2022-04-17 ENCOUNTER — Encounter: Payer: Self-pay | Admitting: Physical Therapy

## 2022-04-17 DIAGNOSIS — R2681 Unsteadiness on feet: Secondary | ICD-10-CM

## 2022-04-17 DIAGNOSIS — R262 Difficulty in walking, not elsewhere classified: Secondary | ICD-10-CM

## 2022-04-17 DIAGNOSIS — R2689 Other abnormalities of gait and mobility: Secondary | ICD-10-CM

## 2022-04-17 DIAGNOSIS — R278 Other lack of coordination: Secondary | ICD-10-CM

## 2022-04-17 DIAGNOSIS — G8929 Other chronic pain: Secondary | ICD-10-CM

## 2022-04-17 DIAGNOSIS — M6281 Muscle weakness (generalized): Secondary | ICD-10-CM

## 2022-04-17 NOTE — Therapy (Signed)
[place] [facility] ?[place], [place], [address] ?Phone: [phone]   Fax:  [phone] ? ?Physical Therapy Treatment ? ?Patient Details  ?Name: Adam Knight ?MRN: [id number] ?Date of Birth: 01/14/1941 ?Referring Provider (PT): End, Harrell Gave, MD ? ? ?Encounter Date: 04/17/2022 ? ? PT End of Session - 04/17/22 0951   ? ? Visit Number 13   ? Number of Visits 25   ? Date for PT Re-Evaluation 05/10/22   ? Authorization Time Period 02/15/2022-05/10/2022   ? Progress Note Due on Visit 10   ? PT Start Time [phone]   ? PT Stop Time 1030   ? PT Time Calculation (min) 40 min   ? Equipment Utilized During Treatment Gait belt   ? Activity Tolerance Patient tolerated treatment well   ? Behavior During Therapy [facility] for tasks assessed/performed   ? ?  ?  ? ?  ? ? ?Past Medical History:  ?Diagnosis Date  ? Actinic keratosis   ? Atypical mole 04/18/2020  ? R temple Atypical Letiginous melanocytic proliferation with regression, clear post biopsy  ? Basal cell carcinoma 04/18/2020  ? Right mid upper forehead, MOHs 06/22/20  ? 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[place])   ? Dyslipidemia   ? Edema   ? Gastric ulcer   ? OSA on CPAP   ? Prediabetes   ? Pulmonary embolism ([place])   ? Seasonal allergies   ? ? ?Past Surgical History:  ?Procedure Laterality Date  ? ENDARTERECTOMY  10/2018  ? Pulmonary endarterectomy for CTEPH - [facility]  ? PULMONARY VENOGRAPHY Bilateral 01/08/2018  ? Procedure: PULMONARY VENOGRAPHY possible thrombolysis;  Surgeon: Katha Cabal, MD;  Location: [facility];  Service: Cardiovascular;  Laterality: Bilateral;  ? TONSILLECTOMY    ? ? ?There were no vitals filed for this visit. ? ? Subjective Assessment - 04/17/22 0950   ? ? Subjective Pt reports he did have bilateral knee cortizone injection. He reports feeling good today. He reports no pain; He reports exercises at home "are not going good." He reports he has too much other work  to do.   ? Pertinent History Pt presents to PT with reports of deconditioning/decreased activity tolerance, balance and strength. Pt reports he noticed a decline 3 years ago following surgical procedure to remove clots from his lungs. Pt did attend pulmonary rehab following procedure but continues to feel somewhat short of breath with activities. Pt reports he has noticed his gait speed has slowed and that his UEs and LEs are weaker. He does report lymphedema in LLE. He states ?I have two bad knees,? but denies pain. He reports unsteadiness with gait and FOF with ascending/descending curbs. Pt endorses difficulty standing up from the floor. The pt reports no falls in past 6 months. He does not use an AD. Other PMH significant for the following: hx of surgical procedure to remove clots in lungs (per pt report 3 years ago), PE, OSA, lymphedema, chronic thromboembolic pulmonary hypertension, DOE, PVD.   ? Limitations Sitting;Walking;Lifting;House hold activities;Standing   ? How long can you sit comfortably? Pt reports interrupted after an hour due to needing to use restroom (on lasix)   ? How long can you stand comfortably? <15 minutes due to fatigue   ? How long can you walk comfortably? Pt reports he can walk 20-30 minutes without stopping, but slowly   ? Diagnostic tests none recent   ? Patient Stated Goals Pt would like to improve his  balance and his walking ability   ? Currently in Pain? No/denies   ? Pain Onset More than a month ago   ? ?  ?  ? ?  ? ? ? ? ? ? ? ? ?INTERVENTIONS:  ?  ?  ?Seated LE strengthening:  ?-Seated ham curl with GTB x 15 reps x BLE ?-Seated hip flex with 3lb. AW alt LE's x 15 reps each ?-Seated knee ext 3lb. AW alt LE's x 15 reps each. ?- Supine Short arc quad 3lb. AW x 15 reps, slowly, required min VCs to do single leg to avoid pull on low back;  ?- Supine- heel slides 3#AW - BLE x 15 reps ?-SLR hip flexion 3# x10  reps each LE with cues to keep knee straight for better strengthening and  motor control;  ?- Supine Bridging x 15 reps (VC for correct technique)  ? ?Standing with 3# ankle weight: ?BLE heel/toe raises x10 reps with cues for proper positioning to isolate ankle strengthening ?Single leg hip abduction SLR x10 reps each LE with cues to keep foot oriented forward for better hip strengthening; ? ?Side stepping over 1/2 bolster x10 reps each direction ?Forward/backward stepping over 1/2 bolster x10 reps with 1 UE assist ?Reported mild difficulty; ? ?Forward step ups x5 reps each LE, exhibits increased difficulty leading with RLE, however denies any increase pain with advanced difficulty;  ? ?Forward/backward walking x10 meter x3 laps with CGA especially when walking backwards. Pt exhibits decreased step length with backward walking;  ? ?Education provided throughout session via VC/TC and demonstration to facilitate movement at target joints and correct muscle activation for all testing and exercises performed.  ?  ? ? ? ? ? ? ? ? ? ? ? ? ? ? ? ? ? ? ? ? PT Education - 04/17/22 0951   ? ? Education Details exercise technique   ? Person(s) Educated Patient   ? Methods Explanation;Verbal cues   ? Comprehension Verbalized understanding;Returned demonstration;Verbal cues required;Need further instruction   ? ?  ?  ? ?  ? ? ? PT Short Term Goals - 03/27/22 1510   ? ?  ? PT SHORT TERM GOAL #1  ? Title Patient will be independent in home exercise program to improve strength/mobility for better functional independence with ADLs.   ? Baseline 2/23: to be issued next 1-2 sessions; 4/4: pt reports he has not been performing his HEP and feels too fatigued to do it after his usual activities. PT updates HEP and instructs pt in importance of performing HEP   ? Time 6   ? Period Weeks   ? Status On-going   ? Target Date 05/08/22   ? ?  ?  ? ?  ? ? ? ? PT Long Term Goals - 03/27/22 0927   ? ?  ? PT LONG TERM GOAL #1  ? Title Patient will increase FOTO score to equal to or greater than 76  to demonstrate  statistically significant improvement in mobility and quality of life.   ? Baseline 2/23: 75 4/4: 69   ? Time 12   ? Period Weeks   ? Status New   ? Target Date 05/10/22   ?  ? PT LONG TERM GOAL #2  ? Title Patient will increase six minute walk test distance to >1000 for progression to community ambulator and improve gait ability   ? Baseline 2/23: 720 ft; 4/4" 697 ft, pt reports L knee pain during test   ?  Time 12   ? Period Weeks   ? Status On-going   ? Target Date 05/10/22   ?  ? PT LONG TERM GOAL #3  ? Title Patient will increase 10 meter walk test to >1.59ms as to improve gait speed for better community ambulation and to reduce fall risk.   ? Baseline 2/23: 0.8 m/s; 4/4: 0.72 m/s   ? Time 12   ? Period Weeks   ? Status On-going   ? Target Date 05/10/22   ?  ? PT LONG TERM GOAL #4  ? Title Patient will tolerate 5 seconds of single leg stance without loss of balance to improve ability to get in and out of shower safely.   ? Baseline 2/23: unable to maintain 1 sec on each LE; 4/4 pt unable to maintain 1 sec each LE   ? Time 12   ? Period Weeks   ? Status On-going   ? Target Date 05/10/22   ?  ? PT LONG TERM GOAL #5  ? Title Patient will increase Berg Balance score by > 6 points to demonstrate decreased fall risk during functional activities.   ? Baseline 2/23: deferred to future date d/t time; 2/28: 48/56; 4/4: 49/56   ? Time 12   ? Period Weeks   ? Status On-going   ? Target Date 05/10/22   ? ?  ?  ? ?  ? ? ? ? ? ? ? ? Plan - 04/17/22 1006   ? ? Clinical Impression Statement Patient motivated and participated well within session; He was instructed in advanced LE strengthening. He does require min VCS for proper exercise technique and positioning. He reports increased difficulty with LLE exercise due to weakness and chronic pain. He denies any increase in pain in BLE with increased exercise. Instructed patient in standing exercise. He required BUE HHA on counter for stability especially when negotiating obstacle.  Patient would benefit from additional skilled PT Intervention to improve strength, balance and mobility; Plan to focus on LE strengthening in subsequent sessions to build stability and control. Patient toler

## 2022-04-19 ENCOUNTER — Encounter: Payer: Self-pay | Admitting: Physical Therapy

## 2022-04-19 ENCOUNTER — Ambulatory Visit: Payer: Medicare Other | Admitting: Physical Therapy

## 2022-04-19 DIAGNOSIS — R262 Difficulty in walking, not elsewhere classified: Secondary | ICD-10-CM

## 2022-04-19 DIAGNOSIS — G8929 Other chronic pain: Secondary | ICD-10-CM

## 2022-04-19 DIAGNOSIS — R2681 Unsteadiness on feet: Secondary | ICD-10-CM

## 2022-04-19 DIAGNOSIS — R2689 Other abnormalities of gait and mobility: Secondary | ICD-10-CM

## 2022-04-19 DIAGNOSIS — M6281 Muscle weakness (generalized): Secondary | ICD-10-CM

## 2022-04-19 DIAGNOSIS — R278 Other lack of coordination: Secondary | ICD-10-CM

## 2022-04-19 NOTE — Therapy (Signed)
[facility] ?[place], [place], [address] ?Phone: [phone]   Fax:  [phone] ? ?Physical Therapy Treatment ? ?Patient Details  ?Name: Adam Knight ?MRN: [id number] ?Date of Birth: 02/09/1941 ?Referring Provider (PT): End, Harrell Gave, MD ? ? ?Encounter Date: 04/19/2022 ? ? PT End of Session - 04/19/22 0940   ? ? Visit Number 14   ? Number of Visits 25   ? Date for PT Re-Evaluation 05/10/22   ? Authorization Time Period 02/15/2022-05/10/2022   ? Progress Note Due on Visit 10   ? PT Start Time 2458   ? PT Stop Time 1015   ? PT Time Calculation (min) 43 min   ? Equipment Utilized During Treatment Gait belt   ? Activity Tolerance Patient tolerated treatment well   ? Behavior During Therapy [facility] for tasks assessed/performed   ? ?  ?  ? ?  ? ? ?Past Medical History:  ?Diagnosis Date  ? Actinic keratosis   ? Atypical mole 04/18/2020  ? R temple Atypical Letiginous melanocytic proliferation with regression, clear post biopsy  ? Basal cell carcinoma 04/18/2020  ? Right mid upper forehead, MOHs 06/22/20  ? 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[place])   ? Dyslipidemia   ? Edema   ? Gastric ulcer   ? OSA on CPAP   ? Prediabetes   ? Pulmonary embolism ([place])   ? Seasonal allergies   ? ? ?Past Surgical History:  ?Procedure Laterality Date  ? ENDARTERECTOMY  10/2018  ? Pulmonary endarterectomy for CTEPH - [facility]  ? PULMONARY VENOGRAPHY Bilateral 01/08/2018  ? Procedure: PULMONARY VENOGRAPHY possible thrombolysis;  Surgeon: Katha Cabal, MD;  Location: [facility];  Service: Cardiovascular;  Laterality: Bilateral;  ? TONSILLECTOMY    ? ? ?There were no vitals filed for this visit. ? ? Subjective Assessment - 04/19/22 0936   ? ? Subjective Patient reports shampooing a lot of carpets in his home yesterday and reports increased soreness in BLE knee;   ? Pertinent History Pt presents to PT with reports of deconditioning/decreased activity  tolerance, balance and strength. Pt reports he noticed a decline 3 years ago following surgical procedure to remove clots from his lungs. Pt did attend pulmonary rehab following procedure but continues to feel somewhat short of breath with activities. Pt reports he has noticed his gait speed has slowed and that his UEs and LEs are weaker. He does report lymphedema in LLE. He states ?I have two bad knees,? but denies pain. He reports unsteadiness with gait and FOF with ascending/descending curbs. Pt endorses difficulty standing up from the floor. The pt reports no falls in past 6 months. He does not use an AD. Other PMH significant for the following: hx of surgical procedure to remove clots in lungs (per pt report 3 years ago), PE, OSA, lymphedema, chronic thromboembolic pulmonary hypertension, DOE, PVD.   ? Limitations Sitting;Walking;Lifting;House hold activities;Standing   ? How long can you sit comfortably? Pt reports interrupted after an hour due to needing to use restroom (on lasix)   ? How long can you stand comfortably? <15 minutes due to fatigue   ? How long can you walk comfortably? Pt reports he can walk 20-30 minutes without stopping, but slowly   ? Diagnostic tests none recent   ? Patient Stated Goals Pt would like to improve his balance and his walking ability   ? Currently in Pain? Yes   ? Pain Score 3    ?  Pain Location Knee   ? Pain Orientation Right;Left   ? Pain Descriptors / Indicators Aching;Sore   ? Pain Type Chronic pain   ? Pain Onset More than a month ago   ? Pain Frequency Intermittent   ? Aggravating Factors  worse with walking/weight bearing   ? Pain Relieving Factors soak with epsolm salt   ? Effect of Pain on Daily Activities decreased activity tolerance;   ? ?  ?  ? ?  ? ? ? ? ? ? ? ? ?  ?INTERVENTIONS:  ?  ?  ?Seated LE strengthening:  ?-Seated ham curl with GTB x 15 reps x BLE, exhibits less knee instability on LLE compared to previous session;  ?-Seated knee ext 3lb. AW alt LE's x 15  reps each. Exhibits good control and positioning;  ? ?Hooklying:  ?- Supine Short arc quad 3lb. AW x 15 reps, slowly, required min VCs to do single leg to avoid pull on low back;  ?-Hooklying hip flexion march x15 reps each LE with cues to avoid excessive knee flexion for better tolerance;  ?-SLR hip flexion 3# x10  reps each LE with cues to keep knee straight for better strengthening and motor control;  ?- Supine Bridging x 15 reps (VC for correct technique)  ?Pt reports less pain in BLE knee upon standing compared to start of session;  ?  ?Standing:  ? ?Side stepping over 1/2 bolster x10 reps each direction unsupported with moderate difficulty;  ?Forward/backward stepping over 1/2 bolster with single leg x5 reps with 1 UE assist, pt unable to complete without rail assist with difficulty shifting weight posteriorly; ?Reported mild difficulty; ?  ?Instructed patient in obstacle negotiation: ?Weave around #5 cones ?Stepping over 1/2 bolster ?Step up on airex pad and over ?X4 sets each with CGA when stepping over or up for balance control;  ?  ?Forward/backward walking x10 meter x3 laps with close supervision especially when walking backwards. Pt exhibits decreased step length with backward walking; He was able to exhibit better step length when walking backward today compared to previous sessions;  ?  ?Education provided throughout session via VC/TC and demonstration to facilitate movement at target joints and correct muscle activation for all testing and exercises performed.  ? ? ? ? ? ? ? ? ? ? ? ? ? ? ? ? ? ? ? ? PT Education - 04/19/22 0940   ? ? Education Details exercise technique;   ? Person(s) Educated Patient   ? Methods Explanation;Verbal cues   ? Comprehension Verbalized understanding;Returned demonstration;Verbal cues required;Need further instruction   ? ?  ?  ? ?  ? ? ? PT Short Term Goals - 03/27/22 1510   ? ?  ? PT SHORT TERM GOAL #1  ? Title Patient will be independent in home exercise program to  improve strength/mobility for better functional independence with ADLs.   ? Baseline 2/23: to be issued next 1-2 sessions; 4/4: pt reports he has not been performing his HEP and feels too fatigued to do it after his usual activities. PT updates HEP and instructs pt in importance of performing HEP   ? Time 6   ? Period Weeks   ? Status On-going   ? Target Date 05/08/22   ? ?  ?  ? ?  ? ? ? ? PT Long Term Goals - 03/27/22 0927   ? ?  ? PT LONG TERM GOAL #1  ? Title Patient will increase FOTO score to  equal to or greater than 76  to demonstrate statistically significant improvement in mobility and quality of life.   ? Baseline 2/23: 75 4/4: 69   ? Time 12   ? Period Weeks   ? Status New   ? Target Date 05/10/22   ?  ? PT LONG TERM GOAL #2  ? Title Patient will increase six minute walk test distance to >1000 for progression to community ambulator and improve gait ability   ? Baseline 2/23: 720 ft; 4/4" 697 ft, pt reports L knee pain during test   ? Time 12   ? Period Weeks   ? Status On-going   ? Target Date 05/10/22   ?  ? PT LONG TERM GOAL #3  ? Title Patient will increase 10 meter walk test to >1.64ms as to improve gait speed for better community ambulation and to reduce fall risk.   ? Baseline 2/23: 0.8 m/s; 4/4: 0.72 m/s   ? Time 12   ? Period Weeks   ? Status On-going   ? Target Date 05/10/22   ?  ? PT LONG TERM GOAL #4  ? Title Patient will tolerate 5 seconds of single leg stance without loss of balance to improve ability to get in and out of shower safely.   ? Baseline 2/23: unable to maintain 1 sec on each LE; 4/4 pt unable to maintain 1 sec each LE   ? Time 12   ? Period Weeks   ? Status On-going   ? Target Date 05/10/22   ?  ? PT LONG TERM GOAL #5  ? Title Patient will increase Berg Balance score by > 6 points to demonstrate decreased fall risk during functional activities.   ? Baseline 2/23: deferred to future date d/t time; 2/28: 48/56; 4/4: 49/56   ? Time 12   ? Period Weeks   ? Status On-going   ? Target  Date 05/10/22   ? ?  ?  ? ?  ? ? ? ? ? ? ? ? Plan - 04/19/22 1217   ? ? Clinical Impression Statement Patient motivated and participated fair within session. Initially he reports increased soreness in

## 2022-04-24 ENCOUNTER — Ambulatory Visit: Payer: Medicare Other | Attending: Internal Medicine | Admitting: Physical Therapy

## 2022-04-24 ENCOUNTER — Encounter: Payer: Self-pay | Admitting: Physical Therapy

## 2022-04-24 DIAGNOSIS — G8929 Other chronic pain: Secondary | ICD-10-CM | POA: Diagnosis present

## 2022-04-24 DIAGNOSIS — M6281 Muscle weakness (generalized): Secondary | ICD-10-CM | POA: Insufficient documentation

## 2022-04-24 DIAGNOSIS — R2681 Unsteadiness on feet: Secondary | ICD-10-CM | POA: Insufficient documentation

## 2022-04-24 DIAGNOSIS — R278 Other lack of coordination: Secondary | ICD-10-CM | POA: Diagnosis present

## 2022-04-24 DIAGNOSIS — R262 Difficulty in walking, not elsewhere classified: Secondary | ICD-10-CM | POA: Diagnosis present

## 2022-04-24 DIAGNOSIS — R2689 Other abnormalities of gait and mobility: Secondary | ICD-10-CM | POA: Insufficient documentation

## 2022-04-24 DIAGNOSIS — M25562 Pain in left knee: Secondary | ICD-10-CM | POA: Diagnosis present

## 2022-04-24 NOTE — Patient Instructions (Addendum)
Reiewed HEP with pt: ?Access Code: [email or web address] ?URL: [email or web address] ?Date: 02/20/2022 ?Prepared by: Ricard Dillon ?  ?Exercises ?Sit to Stand with Counter Support - 1 x daily - 5 x weekly - 3 sets - 10 reps ?Semi-Tandem Balance at [employer] Open - 1 x daily - 7 x weekly - 2 sets - 2 reps - 30 hold ?Standing Hip Abduction with Counter Support - 1 x daily - 5 x weekly - 3 sets - 10 reps ? ?Access Code: [email or web address] ?URL: [email or web address] ?Date: 03/27/2022 ?Prepared by: Ricard Dillon ?  ?Exercises ?- Standing Single Leg Stance with Counter Support  - 1 x daily - 7 x weekly - 1 sets - 2 reps - 30 hold ?  ?

## 2022-04-24 NOTE — Therapy (Signed)
[facility] ?[place], [place], [address] ?Phone: [phone]   Fax:  [phone] ? ?Physical Therapy Treatment ? ?Patient Details  ?Name: Adam Knight ?MRN: [id number] ?Date of Birth: 02-Nov-1941 ?Referring Provider (PT): End, Harrell Gave, MD ? ? ?Encounter Date: 04/24/2022 ? ? PT End of Session - 04/24/22 0936   ? ? Visit Number 15   ? Number of Visits 25   ? Date for PT Re-Evaluation 05/10/22   ? Authorization Time Period 02/15/2022-05/10/2022   ? Progress Note Due on Visit 10   ? PT Start Time 5916   ? PT Stop Time 1015   ? PT Time Calculation (min) 43 min   ? Equipment Utilized During Treatment Gait belt   ? Activity Tolerance Patient tolerated treatment well   ? Behavior During Therapy [facility] for tasks assessed/performed   ? ?  ?  ? ?  ? ? ?Past Medical History:  ?Diagnosis Date  ? Actinic keratosis   ? Atypical mole 04/18/2020  ? R temple Atypical Letiginous melanocytic proliferation with regression, clear post biopsy  ? Basal cell carcinoma 04/18/2020  ? Right mid upper forehead, MOHs 06/22/20  ? 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 Dyslipidemia   ? Edema   ? Gastric ulcer   ? OSA on CPAP   ? Prediabetes   ? Pulmonary embolism ([place])   ? Seasonal allergies   ? ? ?Past Surgical History:  ?Procedure Laterality Date  ? ENDARTERECTOMY  10/2018  ? Pulmonary endarterectomy for CTEPH - [facility]  ? PULMONARY VENOGRAPHY Bilateral 01/08/2018  ? Procedure: PULMONARY VENOGRAPHY possible thrombolysis;  Surgeon: Katha Cabal, MD;  Location: [facility];  Service: Cardiovascular;  Laterality: Bilateral;  ? TONSILLECTOMY    ? ? ?There were no vitals filed for this visit. ? ? Subjective Assessment - 04/24/22 0935   ? ? Subjective Patient reports doing okay today. He reports some soreness in left knee; No other new complaints; He reports staying busy with things to do at home but hasn't done any of the exercises given by PT. Pt  reports walking around baseball field track 3x last week;   ? Pertinent History Pt presents to PT with reports of deconditioning/decreased activity tolerance, balance and strength. Pt reports he noticed a decline 3 years ago following surgical procedure to remove clots from his lungs. Pt did attend pulmonary rehab following procedure but continues to feel somewhat short of breath with activities. Pt reports he has noticed his gait speed has slowed and that his UEs and LEs are weaker. He does report lymphedema in LLE. He states ?I have two bad knees,? but denies pain. He reports unsteadiness with gait and FOF with ascending/descending curbs. Pt endorses difficulty standing up from the floor. The pt reports no falls in past 6 months. He does not use an AD. Other PMH significant for the following: hx of surgical procedure to remove clots in lungs (per pt report 3 years ago), PE, OSA, lymphedema, chronic thromboembolic pulmonary hypertension, DOE, PVD.   ? Limitations Sitting;Walking;Lifting;House hold activities;Standing   ? How long can you sit comfortably? Pt reports interrupted after an hour due to needing to use restroom (on lasix)   ? How long can you stand comfortably? <15 minutes due to fatigue   ? How long can you walk comfortably? Pt reports he can walk 20-30 minutes without stopping, but slowly   ? Diagnostic tests none recent   ?  Patient Stated Goals Pt would like to improve his balance and his walking ability   ? Currently in Pain? Yes   ? Pain Score 2    ? Pain Location Knee   ? Pain Orientation Left   ? Pain Descriptors / Indicators Aching;Sore   ? Pain Type Chronic pain   ? Pain Onset More than a month ago   ? Pain Frequency Intermittent   ? Aggravating Factors  worse with walking/weight bearing   ? Pain Relieving Factors soak with epsolm salt   ? Effect of Pain on Daily Activities decreased activity tolerance;   ? Multiple Pain Sites No   ? ?  ?  ? ?  ? ? ? ? ?INTERVENTIONS:  ?  ?  ?Seated LE  strengthening:  ?-Seated ham curl with GTB x 15 reps x BLE, exhibits less knee instability on LLE compared to previous session;  ?-Seated knee ext 3lb. AW alt LE's x 15 reps each. Exhibits good control and positioning;  ?-seated ankle DF/PF 3# ankle weight x15 reps;  ?-BLE LAQ with ball between feet to challenge quad control and positioning x15 reps; ?  ?Standing:  ?With 3# ankle weight on BLE: ?-hip flexion march x10 reps each LE; ?-hip abduction x10 reps with cues to avoid hip ER ?-hamstring curl x10 reps with cues for erect posture ?Patient required BUE rail assist and cues to avoid flexed posture and improve trunk extension; ? ?Re-educated patient in HEP: ?Sit<>Stand from chair x10 reps ?Modified tandem stance with 1-0 rail assist 30 sec hold x2 reps each foot in front; ?SLS on firm surface with 1 UE support 30 sec hold x2 reps each LE ?Hip abduction SLR without resistance x10 reps each LE ?Patient able to complete with some difficulty. He does require min VCs for proper positioning; Reports increased fatigue after exercise. ?Pt able to complete exercise in <10 min, educated patient on importance of adherence for LE strengthening, balance and to facilitate better knee stability for improved mobility and safety;  ? ?Side stepping over 1/2 bolster x10 reps each direction unsupported with moderate difficulty;  ?Forward/backward stepping over 1/2 bolster with single leg x10 reps with 1-0 UE assist, pt more unsteady without rail assist requiring min A for safety;  ?Reported mild difficulty; ?  ?  ?Education provided throughout session via VC/TC and demonstration to facilitate movement at target joints and correct muscle activation for all testing and exercises performed.  ?  ?  ?  ? ? ? ? ? ? ? ? ? ? ? ? ? ? ? ? ? ? ? ? ? ? ? ? PT Education - 04/24/22 0936   ? ? Education Details exercise technique;   ? Person(s) Educated Patient   ? Methods Explanation;Verbal cues   ? Comprehension Verbalized understanding;Returned  demonstration;Verbal cues required;Need further instruction   ? ?  ?  ? ?  ? ? ? PT Short Term Goals - 03/27/22 1510   ? ?  ? PT SHORT TERM GOAL #1  ? Title Patient will be independent in home exercise program to improve strength/mobility for better functional independence with ADLs.   ? Baseline 2/23: to be issued next 1-2 sessions; 4/4: pt reports he has not been performing his HEP and feels too fatigued to do it after his usual activities. PT updates HEP and instructs pt in importance of performing HEP   ? Time 6   ? Period Weeks   ? Status On-going   ? Target  Date 05/08/22   ? ?  ?  ? ?  ? ? ? ? PT Long Term Goals - 03/27/22 0927   ? ?  ? PT LONG TERM GOAL #1  ? Title Patient will increase FOTO score to equal to or greater than 76  to demonstrate statistically significant improvement in mobility and quality of life.   ? Baseline 2/23: 75 4/4: 69   ? Time 12   ? Period Weeks   ? Status New   ? Target Date 05/10/22   ?  ? PT LONG TERM GOAL #2  ? Title Patient will increase six minute walk test distance to >1000 for progression to community ambulator and improve gait ability   ? Baseline 2/23: 720 ft; 4/4" 697 ft, pt reports L knee pain during test   ? Time 12   ? Period Weeks   ? Status On-going   ? Target Date 05/10/22   ?  ? PT LONG TERM GOAL #3  ? Title Patient will increase 10 meter walk test to >1.35ms as to improve gait speed for better community ambulation and to reduce fall risk.   ? Baseline 2/23: 0.8 m/s; 4/4: 0.72 m/s   ? Time 12   ? Period Weeks   ? Status On-going   ? Target Date 05/10/22   ?  ? PT LONG TERM GOAL #4  ? Title Patient will tolerate 5 seconds of single leg stance without loss of balance to improve ability to get in and out of shower safely.   ? Baseline 2/23: unable to maintain 1 sec on each LE; 4/4 pt unable to maintain 1 sec each LE   ? Time 12   ? Period Weeks   ? Status On-going   ? Target Date 05/10/22   ?  ? PT LONG TERM GOAL #5  ? Title Patient will increase Berg Balance score by  > 6 points to demonstrate decreased fall risk during functional activities.   ? Baseline 2/23: deferred to future date d/t time; 2/28: 48/56; 4/4: 49/56   ? Time 12   ? Period Weeks   ? Status On-going   ? [place]

## 2022-04-26 ENCOUNTER — Ambulatory Visit: Payer: Medicare Other | Admitting: Physical Therapy

## 2022-04-26 ENCOUNTER — Encounter: Payer: Self-pay | Admitting: Physical Therapy

## 2022-04-26 DIAGNOSIS — R2689 Other abnormalities of gait and mobility: Secondary | ICD-10-CM

## 2022-04-26 DIAGNOSIS — M6281 Muscle weakness (generalized): Secondary | ICD-10-CM

## 2022-04-26 DIAGNOSIS — M25562 Pain in left knee: Secondary | ICD-10-CM | POA: Diagnosis not present

## 2022-04-26 DIAGNOSIS — R262 Difficulty in walking, not elsewhere classified: Secondary | ICD-10-CM

## 2022-04-26 DIAGNOSIS — G8929 Other chronic pain: Secondary | ICD-10-CM

## 2022-04-26 DIAGNOSIS — R2681 Unsteadiness on feet: Secondary | ICD-10-CM

## 2022-04-26 DIAGNOSIS — R278 Other lack of coordination: Secondary | ICD-10-CM

## 2022-04-26 NOTE — Therapy (Signed)
[facility] ?[place], [place], [address] ?Phone: [phone]   Fax:  [phone] ? ?Physical Therapy Treatment ? ?Patient Details  ?Name: Adam Knight ?MRN: [id number] ?Date of Birth: 1941-07-30 ?Referring Provider (PT): End, Harrell Gave, MD ? ? ?Encounter Date: 04/26/2022 ? ? PT End of Session - 04/26/22 0945   ? ? Visit Number 16   ? Number of Visits 25   ? Date for PT Re-Evaluation 05/10/22   ? Authorization Time Period 02/15/2022-05/10/2022   ? Progress Note Due on Visit 10   ? PT Start Time 3419   ? PT Stop Time 1015   ? PT Time Calculation (min) 43 min   ? Equipment Utilized During Treatment Gait belt   ? Activity Tolerance Patient tolerated treatment well   ? Behavior During Therapy [facility] for tasks assessed/performed   ? ?  ?  ? ?  ? ? ?Past Medical History:  ?Diagnosis Date  ? Actinic keratosis   ? Atypical mole 04/18/2020  ? R temple Atypical Letiginous melanocytic proliferation with regression, clear post biopsy  ? Basal cell carcinoma 04/18/2020  ? Right mid upper forehead, MOHs 06/22/20  ? 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[place])   ? Dyslipidemia   ? Edema   ? Gastric ulcer   ? OSA on CPAP   ? Prediabetes   ? Pulmonary embolism ([place])   ? Seasonal allergies   ? ? ?Past Surgical History:  ?Procedure Laterality Date  ? ENDARTERECTOMY  10/2018  ? Pulmonary endarterectomy for CTEPH - [facility]  ? PULMONARY VENOGRAPHY Bilateral 01/08/2018  ? Procedure: PULMONARY VENOGRAPHY possible thrombolysis;  Surgeon: Katha Cabal, MD;  Location: [facility];  Service: Cardiovascular;  Laterality: Bilateral;  ? TONSILLECTOMY    ? ? ?There were no vitals filed for this visit. ? ? Subjective Assessment - 04/26/22 0934   ? ? Subjective Pt reports feeling weaker today. He had to take his cat to the vet and experienced a lot of scratches. reports 3/10 in LLE knee; reports he did try some of his HEP and state it went [facility].   ? Pertinent  History Pt presents to PT with reports of deconditioning/decreased activity tolerance, balance and strength. Pt reports he noticed a decline 3 years ago following surgical procedure to remove clots from his lungs. Pt did attend pulmonary rehab following procedure but continues to feel somewhat short of breath with activities. Pt reports he has noticed his gait speed has slowed and that his UEs and LEs are weaker. He does report lymphedema in LLE. He states ?I have two bad knees,? but denies pain. He reports unsteadiness with gait and FOF with ascending/descending curbs. Pt endorses difficulty standing up from the floor. The pt reports no falls in past 6 months. He does not use an AD. Other PMH significant for the following: hx of surgical procedure to remove clots in lungs (per pt report 3 years ago), PE, OSA, lymphedema, chronic thromboembolic pulmonary hypertension, DOE, PVD.   ? Limitations Sitting;Walking;Lifting;House hold activities;Standing   ? How long can you sit comfortably? Pt reports interrupted after an hour due to needing to use restroom (on lasix)   ? How long can you stand comfortably? <15 minutes due to fatigue   ? How long can you walk comfortably? Pt reports he can walk 20-30 minutes without stopping, but slowly   ? Diagnostic tests none recent   ? Patient Stated Goals Pt would like to improve  his balance and his walking ability   ? Currently in Pain? Yes   ? Pain Score 3    ? Pain Location Knee   ? Pain Orientation Left   ? Pain Descriptors / Indicators Aching;Sore   ? Pain Type Chronic pain   ? Pain Onset More than a month ago   ? Pain Frequency Intermittent   ? Aggravating Factors  worse with walking/weight bearing   ? Pain Relieving Factors soak wiht epsolm salt   ? Effect of Pain on Daily Activities decreased activity tolerance;   ? Multiple Pain Sites No   ? ?  ?  ? ?  ? ? ? ? ? ?  ?  ?INTERVENTIONS:  ? Instructed patient in LE strengthening exercise ? ?Instructed patient in gait around gym  x180 feet x2 laps for warm up with cues for erect posture and to increase step length;  ?  ?Seated LE strengthening:  ?-Seated ham curl with GTB x 15 reps x BLE, exhibits less knee instability on LE compared to previous sessions;  ?-Seated knee ext 3lb. AW alt LE's 2 x 15 reps each. Exhibits good control and positioning;  ?-seated ankle DF/PF 3# ankle weight x15 reps;  ? ?  ?Standing:  ?With 3# ankle weight on BLE: ?-heel/toe raises x15 reps with cues to keep knee straight and limit hip movement to isolate ankle strengthening;  ?-hip flexion march x15 reps each LE; ?-hip abduction x15 reps with cues to avoid hip ER ?-hamstring curl x15 reps with cues for erect posture ?Patient required BUE rail assist and cues to avoid flexed posture and improve trunk extension; ?  ?Sit<>Stand from chair x10 reps ? ?Forward/backward step airex to airex to challenge step length unsupported x10 reps each ?Side stepping airex to airex to challenge step length x10 reps, required intermittent rail assist, exhibiting increased difficulty with side stepping;  ? ?Side stepping over orange hurdle x15 reps each direction with 2 HHA, increased fatigue, required cues to increase step length;  ?Forward/backward stepping over orange hurdle with both legs x15 reps with 1 UE assist,   ?  ?Education provided throughout session via VC/TC and demonstration to facilitate movement at target joints and correct muscle activation for all testing and exercises performed.  ?  ?  ?He reports a little throbbing in LLE knee after session but not significant increase in pain;  ? ? ? ? ? ? ? ? ? ? ? ? ? ? ? ? ? ? ? ? ? ? ? PT Education - 04/26/22 0945   ? ? Education Details exercise technique;   ? Person(s) Educated Patient   ? Methods Explanation;Verbal cues   ? Comprehension Verbalized understanding;Returned demonstration;Verbal cues required;Need further instruction   ? ?  ?  ? ?  ? ? ? PT Short Term Goals - 03/27/22 1510   ? ?  ? PT SHORT TERM GOAL #1  ?  Title Patient will be independent in home exercise program to improve strength/mobility for better functional independence with ADLs.   ? Baseline 2/23: to be issued next 1-2 sessions; 4/4: pt reports he has not been performing his HEP and feels too fatigued to do it after his usual activities. PT updates HEP and instructs pt in importance of performing HEP   ? Time 6   ? Period Weeks   ? Status On-going   ? Target Date 05/08/22   ? ?  ?  ? ?  ? ? ? ? PT  Long Term Goals - 03/27/22 0927   ? ?  ? PT LONG TERM GOAL #1  ? Title Patient will increase FOTO score to equal to or greater than 76  to demonstrate statistically significant improvement in mobility and quality of life.   ? Baseline 2/23: 75 4/4: 69   ? Time 12   ? Period Weeks   ? Status New   ? Target Date 05/10/22   ?  ? PT LONG TERM GOAL #2  ? Title Patient will increase six minute walk test distance to >1000 for progression to community ambulator and improve gait ability   ? Baseline 2/23: 720 ft; 4/4" 697 ft, pt reports L knee pain during test   ? Time 12   ? Period Weeks   ? Status On-going   ? Target Date 05/10/22   ?  ? PT LONG TERM GOAL #3  ? Title Patient will increase 10 meter walk test to >1.80ms as to improve gait speed for better community ambulation and to reduce fall risk.   ? Baseline 2/23: 0.8 m/s; 4/4: 0.72 m/s   ? Time 12   ? Period Weeks   ? Status On-going   ? Target Date 05/10/22   ?  ? PT LONG TERM GOAL #4  ? Title Patient will tolerate 5 seconds of single leg stance without loss of balance to improve ability to get in and out of shower safely.   ? Baseline 2/23: unable to maintain 1 sec on each LE; 4/4 pt unable to maintain 1 sec each LE   ? Time 12   ? Period Weeks   ? Status On-going   ? Target Date 05/10/22   ?  ? PT LONG TERM GOAL #5  ? Title Patient will increase Berg Balance score by > 6 points to demonstrate decreased fall risk during functional activities.   ? Baseline 2/23: deferred to future date d/t time; 2/28: 48/56; 4/4:  49/56   ? Time 12   ? Period Weeks   ? Status On-going   ? Target Date 05/10/22   ? ?  ?  ? ?  ? ? ? ? ? ? ? ? Plan - 04/26/22 1003   ? ? Clinical Impression Statement Patient motivated and participated well

## 2022-05-01 ENCOUNTER — Encounter: Payer: Self-pay | Admitting: Physical Therapy

## 2022-05-01 ENCOUNTER — Ambulatory Visit: Payer: Medicare Other | Admitting: Physical Therapy

## 2022-05-01 DIAGNOSIS — G8929 Other chronic pain: Secondary | ICD-10-CM

## 2022-05-01 DIAGNOSIS — M25562 Pain in left knee: Secondary | ICD-10-CM | POA: Diagnosis not present

## 2022-05-01 DIAGNOSIS — R2689 Other abnormalities of gait and mobility: Secondary | ICD-10-CM

## 2022-05-01 DIAGNOSIS — M6281 Muscle weakness (generalized): Secondary | ICD-10-CM

## 2022-05-01 DIAGNOSIS — R2681 Unsteadiness on feet: Secondary | ICD-10-CM

## 2022-05-01 DIAGNOSIS — R262 Difficulty in walking, not elsewhere classified: Secondary | ICD-10-CM

## 2022-05-01 DIAGNOSIS — R278 Other lack of coordination: Secondary | ICD-10-CM

## 2022-05-01 NOTE — Therapy (Signed)
[facility] ?[place], [place], [address] ?Phone: [phone]   Fax:  [phone] ? ?Physical Therapy Treatment ? ?Patient Details  ?Name: Adam Knight ?MRN: [id number] ?Date of Birth: 1941/02/08 ?Referring Provider (PT): End, Harrell Gave, MD ? ? ?Encounter Date: 05/01/2022 ? ? PT End of Session - 05/01/22 0920   ? ? Visit Number 17   ? Number of Visits 25   ? Date for PT Re-Evaluation 05/10/22   ? Authorization Time Period 02/15/2022-05/10/2022   ? Progress Note Due on Visit 10   ? PT Start Time [phone]   ? PT Stop Time 1012   ? PT Time Calculation (min) 43 min   ? Equipment Utilized During Treatment Gait belt   ? Activity Tolerance Patient tolerated treatment well   ? Behavior During Therapy [facility] for tasks assessed/performed   ? ?  ?  ? ?  ? ? ?Past Medical History:  ?Diagnosis Date  ? Actinic keratosis   ? Atypical mole 04/18/2020  ? R temple Atypical Letiginous melanocytic proliferation with regression, clear post biopsy  ? Basal cell carcinoma 04/18/2020  ? Right mid upper forehead, MOHs 06/22/20  ? 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[place])   ? Dyslipidemia   ? Edema   ? Gastric ulcer   ? OSA on CPAP   ? Prediabetes   ? Pulmonary embolism ([place])   ? Seasonal allergies   ? ? ?Past Surgical History:  ?Procedure Laterality Date  ? ENDARTERECTOMY  10/2018  ? Pulmonary endarterectomy for CTEPH - [facility]  ? PULMONARY VENOGRAPHY Bilateral 01/08/2018  ? Procedure: PULMONARY VENOGRAPHY possible thrombolysis;  Surgeon: Katha Cabal, MD;  Location: [facility];  Service: Cardiovascular;  Laterality: Bilateral;  ? TONSILLECTOMY    ? ? ?There were no vitals filed for this visit. ? ? Subjective Assessment - 05/01/22 0936   ? ? Subjective Patient reports being busy getting ready for a garage sale this weekend. He exhibits increased stiffness in BLE at start of session. Still reports knee pain; He reports increased soreness after last  session; He denies doing HEP but did do some walking   ? Pertinent History Pt presents to PT with reports of deconditioning/decreased activity tolerance, balance and strength. Pt reports he noticed a decline 3 years ago following surgical procedure to remove clots from his lungs. Pt did attend pulmonary rehab following procedure but continues to feel somewhat short of breath with activities. Pt reports he has noticed his gait speed has slowed and that his UEs and LEs are weaker. He does report lymphedema in LLE. He states ?I have two bad knees,? but denies pain. He reports unsteadiness with gait and FOF with ascending/descending curbs. Pt endorses difficulty standing up from the floor. The pt reports no falls in past 6 months. He does not use an AD. Other PMH significant for the following: hx of surgical procedure to remove clots in lungs (per pt report 3 years ago), PE, OSA, lymphedema, chronic thromboembolic pulmonary hypertension, DOE, PVD.   ? Limitations Sitting;Walking;Lifting;House hold activities;Standing   ? How long can you sit comfortably? Pt reports interrupted after an hour due to needing to use restroom (on lasix)   ? How long can you stand comfortably? <15 minutes due to fatigue   ? How long can you walk comfortably? Pt reports he can walk 20-30 minutes without stopping, but slowly   ? Diagnostic tests none recent   ? Patient Stated Goals Pt  would like to improve his balance and his walking ability   ? Currently in Pain? Yes   ? Pain Score 4    ? Pain Location Knee   ? Pain Orientation Left   ? Pain Descriptors / Indicators Aching;Sore   ? Pain Type Chronic pain   ? Pain Onset More than a month ago   ? Pain Frequency Intermittent   ? Aggravating Factors  worse with walking/weight bearing   ? Pain Relieving Factors soak with epsolm salt   ? Effect of Pain on Daily Activities decreased activity tolerance;   ? Multiple Pain Sites No   ? ?  ?  ? ?  ? ? ? ? ?  ?  ?INTERVENTIONS:  ? Instructed patient in LE  strengthening/ROM exercise ?Hooklying: ?Heel slides x10 reps each LE ?SAQ 2# ankle weight x15 reps each LE; ?SLR hip flexion 2# x10 reps each LE with cues to keep knee extended for better stabilization;  ? ?Instructed patient in gait around gym x180 feet x2 laps for warm up with cues for erect posture and to increase step length;  ?  ?Seated LE strengthening:  ?-Seated ham curl with GTB x 15 reps x BLE, exhibits less knee instability on LE compared to previous sessions;  ?-Seated knee ext 3lb. AW alt LE's 2 x 15 reps each. Exhibits good control and positioning;  ?-seated ankle DF/PF 3# ankle weight x15 reps;  ?  ?  ?Standing:  ?With 3# ankle weight on BLE: ?-heel/toe raises x15 reps with cues to keep knee straight and limit hip movement to isolate ankle strengthening;  ?-hip flexion march x15 reps each LE; ?-hip abduction x15 reps with cues to avoid hip ER ?-hamstring curl x15 reps with cues for erect posture ?Patient required BUE rail assist and cues to avoid flexed posture and improve trunk extension; ?  ?Standing on airex pad: ?-alternate toe taps airex to 6 inch step with 1-0 rail assist x15 reps with CGA to min A for safety ?-standing one foot on airex, one foot on 6 inch step: ? Unsupported 30 sec hold with increased instability in knee noted- reinforced importance of working on SLS exercise;  ?  ?Education provided throughout session via VC/TC and demonstration to facilitate movement at target joints and correct muscle activation for all testing and exercises performed.  ?  ?Pt does exhibit increased antalgic gait when first taking a few steps after prolonged sitting. Recommend patient do LAQ to help loosen knee for better mobility and to improve walking tolerance prior to getting up after sitting; pt verbalized understanding;  ? ? ? ? ? ? ? ? ? ? ? ? ? ? ? ? ? ? ? ? ? ? ? ? PT Education - 05/01/22 0920   ? ? Education Details exercise technique;   ? Person(s) Educated Patient   ? Methods Explanation;Verbal  cues   ? Comprehension Verbalized understanding;Returned demonstration;Verbal cues required;Need further instruction   ? ?  ?  ? ?  ? ? ? PT Short Term Goals - 03/27/22 1510   ? ?  ? PT SHORT TERM GOAL #1  ? Title Patient will be independent in home exercise program to improve strength/mobility for better functional independence with ADLs.   ? Baseline 2/23: to be issued next 1-2 sessions; 4/4: pt reports he has not been performing his HEP and feels too fatigued to do it after his usual activities. PT updates HEP and instructs pt in importance of performing HEP   ?  Time 6   ? Period Weeks   ? Status On-going   ? Target Date 05/08/22   ? ?  ?  ? ?  ? ? ? ? PT Long Term Goals - 03/27/22 0927   ? ?  ? PT LONG TERM GOAL #1  ? Title Patient will increase FOTO score to equal to or greater than 76  to demonstrate statistically significant improvement in mobility and quality of life.   ? Baseline 2/23: 75 4/4: 69   ? Time 12   ? Period Weeks   ? Status New   ? Target Date 05/10/22   ?  ? PT LONG TERM GOAL #2  ? Title Patient will increase six minute walk test distance to >1000 for progression to community ambulator and improve gait ability   ? Baseline 2/23: 720 ft; 4/4" 697 ft, pt reports L knee pain during test   ? Time 12   ? Period Weeks   ? Status On-going   ? Target Date 05/10/22   ?  ? PT LONG TERM GOAL #3  ? Title Patient will increase 10 meter walk test to >1.52ms as to improve gait speed for better community ambulation and to reduce fall risk.   ? Baseline 2/23: 0.8 m/s; 4/4: 0.72 m/s   ? Time 12   ? Period Weeks   ? Status On-going   ? Target Date 05/10/22   ?  ? PT LONG TERM GOAL #4  ? Title Patient will tolerate 5 seconds of single leg stance without loss of balance to improve ability to get in and out of shower safely.   ? Baseline 2/23: unable to maintain 1 sec on each LE; 4/4 pt unable to maintain 1 sec each LE   ? Time 12   ? Period Weeks   ? Status On-going   ? Target Date 05/10/22   ?  ? PT LONG TERM GOAL  #5  ? Title Patient will increase Berg Balance score by > 6 points to demonstrate decreased fall risk during functional activities.   ? Baseline 2/23: deferred to future date d/t time; 2/28: 48/56; 4/

## 2022-05-02 ENCOUNTER — Ambulatory Visit (INDEPENDENT_AMBULATORY_CARE_PROVIDER_SITE_OTHER): Payer: Medicare Other

## 2022-05-02 DIAGNOSIS — Z5181 Encounter for therapeutic drug level monitoring: Secondary | ICD-10-CM | POA: Diagnosis not present

## 2022-05-02 DIAGNOSIS — I2602 Saddle embolus of pulmonary artery with acute cor pulmonale: Secondary | ICD-10-CM

## 2022-05-02 LAB — POCT INR: INR: 2.5 (ref 2.0–3.0)

## 2022-05-02 NOTE — Patient Instructions (Signed)
-   continue dosage of warfarin of 1 tablet (5 mg) every day EXCEPT 1/2 tablet on MONDAYS & FRIDAYS - Recheck INR in 6 weeks 

## 2022-05-03 ENCOUNTER — Ambulatory Visit: Payer: Medicare Other

## 2022-05-08 ENCOUNTER — Ambulatory Visit: Payer: Medicare Other

## 2022-05-08 DIAGNOSIS — R262 Difficulty in walking, not elsewhere classified: Secondary | ICD-10-CM

## 2022-05-08 DIAGNOSIS — M6281 Muscle weakness (generalized): Secondary | ICD-10-CM

## 2022-05-08 DIAGNOSIS — R2681 Unsteadiness on feet: Secondary | ICD-10-CM

## 2022-05-08 DIAGNOSIS — M25562 Pain in left knee: Secondary | ICD-10-CM | POA: Diagnosis not present

## 2022-05-08 NOTE — Therapy (Signed)
[facility] ?[place], [place], [address] ?Phone: [phone]   Fax:  [phone] ? ?Physical Therapy Treatment/DISCHARGE ? ?Patient Details  ?Name: Adam Knight ?MRN: [id number] ?Date of Birth: 09-12-1941 ?Referring Provider (PT): End, Harrell Gave, MD ? ? ?Encounter Date: 05/08/2022 ? ? PT End of Session - 05/09/22 0946   ? ? Visit Number 18   ? Number of Visits 25   ? Date for PT Re-Evaluation 05/10/22   ? Authorization Time Period 02/15/2022-05/10/2022   ? Progress Note Due on Visit 10   ? PT Start Time 1017   ? PT Stop Time 6712   ? PT Time Calculation (min) 36 min   ? Equipment Utilized During Treatment Gait belt   ? Activity Tolerance Patient tolerated treatment well   ? Behavior During Therapy [facility] for tasks assessed/performed   ? ?  ?  ? ?  ? ? ?Past Medical History:  ?Diagnosis Date  ? Actinic keratosis   ? Atypical mole 04/18/2020  ? R temple Atypical Letiginous melanocytic proliferation with regression, clear post biopsy  ? Basal cell carcinoma 04/18/2020  ? Right mid upper forehead, MOHs 06/22/20  ? 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[place])   ? Dyslipidemia   ? Edema   ? Gastric ulcer   ? OSA on CPAP   ? Prediabetes   ? Pulmonary embolism ([place])   ? Seasonal allergies   ? ? ?Past Surgical History:  ?Procedure Laterality Date  ? ENDARTERECTOMY  10/2018  ? Pulmonary endarterectomy for CTEPH - [facility]  ? PULMONARY VENOGRAPHY Bilateral 01/08/2018  ? Procedure: PULMONARY VENOGRAPHY possible thrombolysis;  Surgeon: Katha Cabal, MD;  Location: [facility];  Service: Cardiovascular;  Laterality: Bilateral;  ? TONSILLECTOMY    ? ? ?There were no vitals filed for this visit. ? ? Subjective Assessment - 05/08/22 1021   ? ? Subjective Pt reports he has done some of his HEP, but not consistent. Pt reports 3-4/10 pain in his knee.   ? Pertinent History Pt presents to PT with reports of deconditioning/decreased activity  tolerance, balance and strength. Pt reports he noticed a decline 3 years ago following surgical procedure to remove clots from his lungs. Pt did attend pulmonary rehab following procedure but continues to feel somewhat short of breath with activities. Pt reports he has noticed his gait speed has slowed and that his UEs and LEs are weaker. He does report lymphedema in LLE. He states ?I have two bad knees,? but denies pain. He reports unsteadiness with gait and FOF with ascending/descending curbs. Pt endorses difficulty standing up from the floor. The pt reports no falls in past 6 months. He does not use an AD. Other PMH significant for the following: hx of surgical procedure to remove clots in lungs (per pt report 3 years ago), PE, OSA, lymphedema, chronic thromboembolic pulmonary hypertension, DOE, PVD.   ? Limitations Sitting;Walking;Lifting;House hold activities;Standing   ? How long can you sit comfortably? Pt reports interrupted after an hour due to needing to use restroom (on lasix)   ? How long can you stand comfortably? <15 minutes due to fatigue   ? How long can you walk comfortably? Pt reports he can walk 20-30 minutes without stopping, but slowly   ? Diagnostic tests none recent   ? Patient Stated Goals Pt would like to improve his balance and his walking ability   ? Currently in Pain? Yes   ? Pain Score 4    ?  Pain Orientation Left   ? Pain Onset More than a month ago   ? ?  ?  ? ?  ? ? ? ? ? ?INTERVENTIONS - goals retested (see goal section for details).  ? ?PT provides discharge instructions, reviewed instruction for HEP and encouraged pt to perform HEP to maintain gains and continue benefits of therapy after discharge. Pt verbalizes understanding, however, reports he thinks he will continue to be inconsistent with HEP. ? ? ? ?Pt agreeable to plan for discharge.  ? ? ?Pt educated throughout session about proper posture and technique with exercises. Improved exercise technique, movement at target joints,  use of target muscles after min to mod verbal, visual, tactile cues. ? ? ? PT Education - 05/09/22 0945   ? ? Education Details exercise technique, body mechanics, discharge recommendations   ? Person(s) Educated Patient   ? Methods Explanation;Demonstration;Tactile cues;Verbal cues   ? Comprehension Verbalized understanding;Returned demonstration;Verbal cues required;Need further instruction   ? ?  ?  ? ?  ? ? ? PT Short Term Goals - 05/08/22 1024   ? ?  ? PT SHORT TERM GOAL #1  ? Title Patient will be independent in home exercise program to improve strength/mobility for better functional independence with ADLs.   ? Baseline 2/23: to be issued next 1-2 sessions; 4/4: pt reports he has not been performing his HEP and feels too fatigued to do it after his usual activities. PT updates HEP and instructs pt in importance of performing HEP. 5/16: Pt states, "I'm alright" he has sometimes been working on balance, reports no other interventions, feels confident with interventions but has trouble being consistent   ? Time 6   ? Period Weeks   ? Status Not Met   ? Target Date 05/08/22   ? ?  ?  ? ?  ? ? ? ? PT Long Term Goals - 05/08/22 1025   ? ?  ? PT LONG TERM GOAL #1  ? Title Patient will increase FOTO score to equal to or greater than 76  to demonstrate statistically significant improvement in mobility and quality of life.   ? Baseline 2/23: 75 4/4: 69; 5/16: 73   ? Time 12   ? Period Weeks   ? Status On-going   ? Target Date 05/10/22   ?  ? PT LONG TERM GOAL #2  ? Title Patient will increase six minute walk test distance to >1000 for progression to community ambulator and improve gait ability   ? Baseline 2/23: 720 ft; 4/4" 697 ft, pt reports L knee pain during test; 5/16: 795 ft without   ? Time 12   ? Period Weeks   ? Status On-going   ?  ? PT LONG TERM GOAL #3  ? Title Patient will increase 10 meter walk test to >1.85ms as to improve gait speed for better community ambulation and to reduce fall risk.   ? Baseline  2/23: 0.8 m/s; 4/4: 0.72 m/s; 5/16: 0.89 m/s   ? Time 12   ? Period Weeks   ? Status On-going   ? Target Date 05/10/22   ?  ? PT LONG TERM GOAL #4  ? Title Patient will tolerate 5 seconds of single leg stance without loss of balance to improve ability to get in and out of shower safely.   ? Baseline 2/23: unable to maintain 1 sec on each LE; 4/4 pt unable to maintain 1 sec each LE; 5/16: unable to maintain 1 sec  each LE   ? Time 12   ? Period Weeks   ? Status On-going   ? Target Date 05/10/22   ?  ? PT LONG TERM GOAL #5  ? Title Patient will increase Berg Balance score by > 6 points to demonstrate decreased fall risk during functional activities.   ? Baseline 2/23: deferred to future date d/t time; 2/28: 48/56; 4/4: 49/56; 5/16: 50/56   ? Time 12   ? Period Weeks   ? Status On-going   ? Target Date 05/10/22   ? ?  ?  ? ?  ? ? ? ? ? ? ? ? Plan - 05/09/22 0950   ? ? Clinical Impression Statement Goals retested. Pt has made progress with FOTO, 6MWT, 10MWT, indicating improvement in perceived functional mobility/QOL, improved gait ability and speed. Pt with 1 pt improvement on BERG and no improvement with SLB, likely impacted by inconsistency with HEP. Pt agreeable on this date for plan to discharge from PT. He would like to try interventions independently and was instructed to seek new referral should he feel he needs further physical therpay. Pt verbalized understanding. The pt is to be discharged from PT at this time.   ? Personal Factors and Comorbidities Age;Comorbidity 3+   ? Comorbidities hx of surgical procedure to remove clots in lungs, PE, OSA, lymphedema,  (chronic thromboembolic pulmonary hypertension, DOE, PVD   ? Examination-Activity Limitations Bend;Lift;Locomotion Level;Reach Overhead;Sit;Squat;Stairs;Stand;Transfers   ? Examination-Participation Restrictions Community Activity;Yard Work;Shop;Cleaning   ? Stability/Clinical Decision Making Stable/Uncomplicated   ? Rehab Potential Good   ? PT Frequency 2x  / week   ? PT Duration 12 weeks   ? PT Treatment/Interventions ADLs/Self Care Home Management;Aquatic Therapy;Canalith Repostioning;Biofeedback;Cryotherapy;Electrical Stimulation;Iontophoresis 106m/ml Dex

## 2022-05-10 ENCOUNTER — Ambulatory Visit: Payer: Medicare Other

## 2022-05-15 ENCOUNTER — Ambulatory Visit: Payer: Medicare Other

## 2022-05-15 NOTE — Progress Notes (Signed)
Follow-up Outpatient Visit Date: 05/16/2022  Primary Care Provider: Orene Desanctis, MD [address] [facility] [place] [address]  Chief Complaint: Follow-up CTEPH  HPI:  [name] is a 81 y.o. 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  I last saw him in mid January, at which time he reported stable fatigue and exertional dyspnea.  We had previously increased his torsemide to see if it would help improve his symptoms as well as his chronic edema.  However, this resulted in worsening renal function.  We discussed right heart catheterization to objectively assess his hemodynamics but agreed to defer this in favor of physical therapy.  Echocardiogram last month at [facility] showed normal LVEF.  RV was mildly dilated with mildly reduced systolic function.  Today, Mr. Classon continues to complain of some fatigue and exertional dyspnea.  He notes that he has good days and bad days.  He has not heard anything from Dr. Ala Dach at [facility] regarding the results of his recent echocardiogram and further recommendations.  He denies chest pain and palpitations.  He has sporadic transient lightheadedness, most recently yesterday.  He has not passed out or fall mild leg edema is stable.  He has completed physical therapy and is trying to walk regularly with his wife, though he is not noticed a dramatic improvement in his energy level.  --------------------------------------------------------------------------------------------------  Past Medical History:  Diagnosis Date   Actinic keratosis    Atypical mole 04/18/2020   R temple Atypical Letiginous melanocytic proliferation with regression, clear post biopsy   Basal cell carcinoma 04/18/2020   Right mid upper forehead, MOHs 06/22/20   CTEPH (chronic thromboembolic pulmonary  hypertension) ([facility])    Dyslipidemia    Edema    Gastric ulcer    OSA on CPAP    Prediabetes    Pulmonary embolism ([facility])    Seasonal allergies    Past Surgical History:  Procedure Laterality Date   ENDARTERECTOMY  10/2018   Pulmonary endarterectomy for CTEPH - [facility]   PULMONARY VENOGRAPHY Bilateral 01/08/2018   Procedure: PULMONARY VENOGRAPHY possible thrombolysis;  Surgeon: Renford Dills, MD;  Location: [facility];  Service: Cardiovascular;  Laterality: Bilateral;   TONSILLECTOMY      Current Meds  Medication Sig   busPIRone (BUSPAR) 10 MG tablet Take by mouth 2 (two) times daily.   Calcium Carbonate Antacid (CALCIUM CARBONATE, DOSED IN MG ELEMENTAL CALCIUM,) 1250 MG/5ML SUSP Take by mouth daily.   Cholecalciferol (VITAMIN D) 2000 UNITS tablet Take 2,000 Units by mouth daily.   potassium chloride (KLOR-CON) 10 MEQ tablet Take by mouth. 40 MEQ IN THE MORNING AND 30 MEQ AT NIGHT.   torsemide (DEMADEX) 20 MG tablet Take 2 tablets (40 mg total) by mouth daily.   triamcinolone ointment (KENALOG) 0.1 % APPLY TO RASH ON LEFT ANKLE TWICE A DAY UNTIL IMPROVED. AVOID FACE, GROIN, UNDERARMS.   warfarin (COUMADIN) 5 MG tablet TAKE 1 TABLET BY MOUTH DAILY OR AS DIRECTED    Allergies: Sulfa antibiotics, Spironolactone, and Penicillins  Social History   Tobacco Use   Smoking status: Former    Packs/day: 0.30    Years: 7.00    Pack years: 2.10    Types: Cigarettes    Quit date: 31    Years since quitting: 56.4   Smokeless tobacco: Never  Vaping Use  Vaping Use: Never used  Substance Use Topics   Alcohol use: Not Currently   Drug use: No    Family History  Problem Relation Age of Onset   Stroke Mother    Coronary artery disease Mother    Osteoporosis Mother    Rheum arthritis Mother    Thyroid disease Mother     Review of Systems: A 12-system review of systems was performed and was negative except as noted in the  HPI.  --------------------------------------------------------------------------------------------------  Physical Exam: BP 116/74 (BP Location: Left Arm, Patient Position: Sitting, Cuff Size: Large)   Pulse 78   Ht 5\' 2"  (1.575 m)   Wt 214 lb (97.1 kg)   SpO2 97%   BMI 39.14 kg/m   General:  NAD. Neck: No JVD or HJR. Lungs: Clear to auscultation bilaterally without wheezes or crackles. Heart: Regular rate and rhythm without murmurs, rubs, or gallops. Abdomen: Soft, nontender, nondistended. Extremities: Trace pretibial edema bilaterally.  EKG: Normal sinus rhythm with left axis deviation, right bundle branch block, and anterolateral T wave inversions.  No significant change from prior tracing on 08/24/2021..  Lab Results  Component Value Date   WBC 7.8 03/28/2020   HGB 14.8 03/28/2020   HCT 44.2 03/28/2020   MCV 86.0 03/28/2020   PLT 411 (H) 03/28/2020    Lab Results  Component Value Date   NA 145 (H) 01/11/2022   K 3.6 01/11/2022   CL 102 01/11/2022   CO2 20 01/11/2022   BUN 25 01/11/2022   CREATININE 1.69 (H) 01/11/2022   GLUCOSE 112 (H) 01/11/2022   ALT 24 03/09/2019    No results found for: CHOL, HDL, LDLCALC, LDLDIRECT, TRIG, CHOLHDL  --------------------------------------------------------------------------------------------------  ASSESSMENT AND PLAN: CTEPH and history of pulmonary embolism: Mr. Kohlhoff continues to complain of exertional dyspnea and fatigue that did not improve significantly with physical therapy and regular exercise.  Echocardiogram last month at [facility] was notable for mildly reduced RV function, new compared to 2021.  Other than chronic trace pretibial edema, Mr. Clisham appears euvolemic on exam today.  I am concerned that some of his symptoms could be related to residual pulmonary hypertension.  Pulmonary artery pressure could not be estimated on recent echo.  I think it would be worthwhile to move forward with right heart catheterization to  better assess Mr. Mcnabb's hemodynamics.  If he has significant residual pulmonary hypertension, specific pharmacotherapy may be beneficial.  I will reach out to Dr. Ala Dach at [facility] to get his thoughts.  Right heart catheterization could be performed here or at [facility], though Mr. Mosteller would prefer to do it here from a convenience standpoint.  I have discussed right heart catheterization with Mr. Pepple, including the risks of the procedure including but not limited to bleeding, arrhythmia, and injury to the heart or lungs.  He voices understanding and would be okay to proceed if Dr. Ala Dach and I agree that it would be the next step.  Follow-up: Return to clinic in 6 months.  Yvonne Kendall, MD 05/16/2022 9:09 AM

## 2022-05-16 ENCOUNTER — Ambulatory Visit (INDEPENDENT_AMBULATORY_CARE_PROVIDER_SITE_OTHER): Payer: Medicare Other | Admitting: Internal Medicine

## 2022-05-16 ENCOUNTER — Encounter: Payer: Self-pay | Admitting: Internal Medicine

## 2022-05-16 VITALS — BP 116/74 | HR 78 | Ht 62.0 in | Wt 214.0 lb

## 2022-05-16 DIAGNOSIS — Z86711 Personal history of pulmonary embolism: Secondary | ICD-10-CM | POA: Diagnosis not present

## 2022-05-16 DIAGNOSIS — I2724 Chronic thromboembolic pulmonary hypertension: Secondary | ICD-10-CM | POA: Diagnosis not present

## 2022-05-16 NOTE — Patient Instructions (Addendum)
Medication Instructions:   Your physician recommends that you continue on your current medications as directed. Please refer to the Current Medication list given to you today.  *If you need a refill on your cardiac medications before your next appointment, please call your pharmacy*   Lab Work:  None ordered  Testing/Procedures:  Dr. Saunders Revel will discuss procedure with your pulmonologist and our office will be in touch with you.  If you have not heard back from [place] in the next week, please call us at [phone].   Follow-Up: At [facility], you and your health needs are our priority.  As part of our continuing mission to provide you with exceptional heart care, we have created designated Provider Care Teams.  These Care Teams include your primary Cardiologist (physician) and Advanced Practice Providers (APPs -  Physician Assistants and Nurse Practitioners) who all work together to provide you with the care you need, when you need it.  We recommend signing up for the patient portal called "MyChart".  Sign up information is provided on this After Visit Summary.  MyChart is used to connect with patients for Virtual Visits (Telemedicine).  Patients are able to view lab/test results, encounter notes, upcoming appointments, etc.  Non-urgent messages can be sent to your provider as well.   To learn more about what you can do with MyChart, go to [email or web address].    Your next appointment:   6 month(s)  The format for your next appointment:   In Person  Provider:   You may see Nelva Bush, MD or one of the following Advanced Practice Providers on your designated Care Team:   Murray Hodgkins, NP Christell Faith, PA-C Cadence Kathlen Mody, PA-C{   Important Information About Sugar

## 2022-05-17 ENCOUNTER — Ambulatory Visit: Payer: Medicare Other

## 2022-05-22 ENCOUNTER — Ambulatory Visit: Payer: Medicare Other

## 2022-05-24 ENCOUNTER — Ambulatory Visit: Payer: Medicare Other

## 2022-05-29 ENCOUNTER — Ambulatory Visit: Payer: Medicare Other

## 2022-05-31 ENCOUNTER — Ambulatory Visit: Payer: Medicare Other

## 2022-06-05 ENCOUNTER — Ambulatory Visit: Payer: Medicare Other

## 2022-06-07 ENCOUNTER — Ambulatory Visit: Payer: Medicare Other

## 2022-06-12 ENCOUNTER — Other Ambulatory Visit: Payer: Self-pay | Admitting: Internal Medicine

## 2022-06-12 ENCOUNTER — Ambulatory Visit: Payer: Medicare Other

## 2022-06-12 NOTE — Telephone Encounter (Signed)
Refill Request.  

## 2022-06-12 NOTE — Telephone Encounter (Signed)
Pt is overdue for INR monitoring. Last INR check was on 04/04/2022. Warfarin is a monitored medication. Pt has an appt on 06/13/22; called pt to ensure he has enough warfarin until appt on tomorrow and he stated he does.  He stated he has plenty and reminded him of his appt since he is overdue. Will address refill at appt tomorrow and pt is aware.

## 2022-06-13 ENCOUNTER — Ambulatory Visit (INDEPENDENT_AMBULATORY_CARE_PROVIDER_SITE_OTHER): Payer: Medicare Other

## 2022-06-13 DIAGNOSIS — Z5181 Encounter for therapeutic drug level monitoring: Secondary | ICD-10-CM

## 2022-06-13 DIAGNOSIS — I2602 Saddle embolus of pulmonary artery with acute cor pulmonale: Secondary | ICD-10-CM

## 2022-06-13 LAB — POCT INR: INR: 1.9 — AB (ref 2.0–3.0)

## 2022-06-13 MED ORDER — WARFARIN SODIUM 5 MG PO TABS: ORAL_TABLET | ORAL | 1 refills | Status: DC

## 2022-06-13 NOTE — Patient Instructions (Signed)
-  TAKE 1.5 TABLETS TODAY - continue dosage of warfarin of 1 tablet (5 mg) every day EXCEPT 1/2 tablet on MONDAYS & FRIDAYS - Recheck INR in 6 weeks

## 2022-06-14 ENCOUNTER — Emergency Department: Payer: Medicare Other

## 2022-06-14 ENCOUNTER — Ambulatory Visit: Payer: Medicare Other

## 2022-06-14 ENCOUNTER — Emergency Department
Admission: EM | Admit: 2022-06-14 | Discharge: 2022-06-14 | Disposition: A | Discharge status: Home / Self Care | Payer: Medicare Other | Attending: Emergency Medicine | Admitting: Emergency Medicine

## 2022-06-14 DIAGNOSIS — R600 Localized edema: Secondary | ICD-10-CM | POA: Diagnosis present

## 2022-06-14 DIAGNOSIS — N189 Chronic kidney disease, unspecified: Secondary | ICD-10-CM | POA: Insufficient documentation

## 2022-06-14 DIAGNOSIS — L03119 Cellulitis of unspecified part of limb: Secondary | ICD-10-CM

## 2022-06-14 DIAGNOSIS — L03115 Cellulitis of right lower limb: Secondary | ICD-10-CM | POA: Diagnosis not present

## 2022-06-14 DIAGNOSIS — I129 Hypertensive chronic kidney disease with stage 1 through stage 4 chronic kidney disease, or unspecified chronic kidney disease: Secondary | ICD-10-CM | POA: Diagnosis not present

## 2022-06-14 LAB — COMPREHENSIVE METABOLIC PANEL
ALT: 16 U/L (ref 0–44)
AST: 19 U/L (ref 15–41)
Albumin: 3.9 g/dL (ref 3.5–5.0)
Alkaline Phosphatase: 78 U/L (ref 38–126)
Anion gap: 11 (ref 5–15)
BUN: 26 mg/dL — ABNORMAL HIGH (ref 8–23)
CO2: 26 mmol/L (ref 22–32)
Calcium: 8.6 mg/dL — ABNORMAL LOW (ref 8.9–10.3)
Chloride: 98 mmol/L (ref 98–111)
Creatinine, Ser: 1.44 mg/dL — ABNORMAL HIGH (ref 0.61–1.24)
GFR, Estimated: 49 mL/min — ABNORMAL LOW (ref >60)
Glucose, Bld: 140 mg/dL — ABNORMAL HIGH (ref 70–99)
Potassium: 3.5 mmol/L (ref 3.5–5.1)
Sodium: 135 mmol/L (ref 135–145)
Total Bilirubin: 0.7 mg/dL (ref 0.3–1.2)
Total Protein: 7.3 g/dL (ref 6.5–8.1)

## 2022-06-14 LAB — CBC WITH DIFFERENTIAL/PLATELET
Abs Immature Granulocytes: 0.03 10*3/uL (ref 0.00–0.07)
Basophils Absolute: 0.1 10*3/uL (ref 0.0–0.1)
Basophils Relative: 1 %
Eosinophils Absolute: 0.1 10*3/uL (ref 0.0–0.5)
Eosinophils Relative: 1 %
HCT: 44.2 % (ref 39.0–52.0)
Hemoglobin: 14.7 g/dL (ref 13.0–17.0)
Immature Granulocytes: 0 %
Lymphocytes Relative: 16 %
Lymphs Abs: 1.5 10*3/uL (ref 0.7–4.0)
MCH: 28.5 pg (ref 26.0–34.0)
MCHC: 33.3 g/dL (ref 30.0–36.0)
MCV: 85.7 fL (ref 80.0–100.0)
Monocytes Absolute: 0.8 10*3/uL (ref 0.1–1.0)
Monocytes Relative: 8 %
Neutro Abs: 7.1 10*3/uL (ref 1.7–7.7)
Neutrophils Relative %: 74 %
Platelets: 410 10*3/uL — ABNORMAL HIGH (ref 150–400)
RBC: 5.16 MIL/uL (ref 4.22–5.81)
RDW: 13.9 % (ref 11.5–15.5)
WBC: 9.6 10*3/uL (ref 4.0–10.5)
nRBC: 0 % (ref 0.0–0.2)

## 2022-06-14 LAB — PROTIME-INR
INR: 1.8 — ABNORMAL HIGH (ref 0.8–1.2)
Prothrombin Time: 20.9 seconds — ABNORMAL HIGH (ref 11.4–15.2)

## 2022-06-14 LAB — LACTIC ACID, PLASMA: Lactic Acid, Venous: 1.3 mmol/L (ref 0.5–1.9)

## 2022-06-14 LAB — BRAIN NATRIURETIC PEPTIDE: B Natriuretic Peptide: 30.9 pg/mL (ref 0.0–100.0)

## 2022-06-14 MED ORDER — ACETAMINOPHEN 500 MG PO TABS
1000.0000 mg | ORAL_TABLET | Freq: Once | ORAL | Status: AC
Start: 2022-06-14 — End: 2022-06-14
  Administered 2022-06-14: 1000 mg via ORAL
  Filled 2022-06-14: qty 2

## 2022-06-14 MED ORDER — CEPHALEXIN 500 MG PO CAPS: 500.0000 mg | ORAL_CAPSULE | Freq: Three times a day (TID) | ORAL | 0 refills | Status: AC

## 2022-06-14 MED ORDER — DOXYCYCLINE HYCLATE 100 MG PO CAPS: 100.0000 mg | ORAL_CAPSULE | Freq: Two times a day (BID) | ORAL | 0 refills | Status: AC

## 2022-06-14 MED ORDER — VANCOMYCIN HCL 2000 MG/400ML IV SOLN
2000.0000 mg | Freq: Once | INTRAVENOUS | Status: AC
  Administered 2022-06-14: 2000 mg via INTRAVENOUS
  Filled 2022-06-14: qty 400

## 2022-06-14 MED ORDER — SODIUM CHLORIDE 0.9 % IV SOLN
2.0000 g | Freq: Once | INTRAVENOUS | Status: AC
  Administered 2022-06-14: 2 g via INTRAVENOUS
  Filled 2022-06-14: qty 20

## 2022-06-14 NOTE — ED Triage Notes (Addendum)
Pt comes pov from home with right lower leg swelling, redness, pain since yesterday. Pt is on coumadin and had levels checked yesterday. Pt states he tried to soak his leg in epson salt for the pain but it didn't help.    MD gave pt extra 0.5 tab of coumadin yesterday for INR being 1.9

## 2022-06-14 NOTE — Discharge Instructions (Signed)
Take your normal Coumadin dose.  The doxycycline that you are being started on we will increase your INR, so this should be enough to get it over 2.  Please call your doctor this afternoon.  Discuss your ER visit and arrange follow-up within the next 48 hours.  Also recommend discussing your INR with your Coumadin clinic as well as notifying them that you are being started on antibiotics.

## 2022-06-14 NOTE — ED Notes (Signed)
Pt was able to ambulate around room independently with slight limp.

## 2022-06-14 NOTE — Consult Note (Signed)
PHARMACY -  BRIEF ANTIBIOTIC NOTE   Pharmacy has received consult(s) for Vancomycin from an ED provider.  The patient's profile has been reviewed for ht/wt/allergies/indication/available labs.    One time order(s) placed for  Vancomycin 2000 mg   Further antibiotics/pharmacy consults should be ordered by admitting physician if indicated.                       Thank you, Dorothe Pea, PharmD, BCPS Clinical Pharmacist   06/14/2022  10:24 AM

## 2022-06-15 ENCOUNTER — Ambulatory Visit: Payer: Medicare Other

## 2022-06-19 ENCOUNTER — Ambulatory Visit: Payer: Medicare Other | Admitting: Physical Therapy

## 2022-06-21 ENCOUNTER — Ambulatory Visit: Payer: Medicare Other | Admitting: Physical Therapy

## 2022-07-25 ENCOUNTER — Ambulatory Visit (INDEPENDENT_AMBULATORY_CARE_PROVIDER_SITE_OTHER): Payer: Medicare Other

## 2022-07-25 DIAGNOSIS — Z5181 Encounter for therapeutic drug level monitoring: Secondary | ICD-10-CM | POA: Diagnosis not present

## 2022-07-25 DIAGNOSIS — I2602 Saddle embolus of pulmonary artery with acute cor pulmonale: Secondary | ICD-10-CM

## 2022-07-25 LAB — POCT INR: INR: 2.5 (ref 2.0–3.0)

## 2022-07-25 NOTE — Patient Instructions (Signed)
-   continue dosage of warfarin of 1 tablet (5 mg) every day EXCEPT 1/2 tablet on MONDAYS & FRIDAYS - Recheck INR in 6 weeks 

## 2022-08-06 ENCOUNTER — Ambulatory Visit (INDEPENDENT_AMBULATORY_CARE_PROVIDER_SITE_OTHER): Payer: Medicare Other | Admitting: Dermatology

## 2022-08-06 DIAGNOSIS — L570 Actinic keratosis: Secondary | ICD-10-CM

## 2022-08-06 DIAGNOSIS — L814 Other melanin hyperpigmentation: Secondary | ICD-10-CM

## 2022-08-06 DIAGNOSIS — Z85828 Personal history of other malignant neoplasm of skin: Secondary | ICD-10-CM | POA: Diagnosis not present

## 2022-08-06 DIAGNOSIS — D692 Other nonthrombocytopenic purpura: Secondary | ICD-10-CM | POA: Diagnosis not present

## 2022-08-06 DIAGNOSIS — L578 Other skin changes due to chronic exposure to nonionizing radiation: Secondary | ICD-10-CM

## 2022-08-06 DIAGNOSIS — L820 Inflamed seborrheic keratosis: Secondary | ICD-10-CM | POA: Diagnosis not present

## 2022-08-06 DIAGNOSIS — Z1283 Encounter for screening for malignant neoplasm of skin: Secondary | ICD-10-CM | POA: Diagnosis not present

## 2022-08-06 DIAGNOSIS — L821 Other seborrheic keratosis: Secondary | ICD-10-CM

## 2022-08-06 DIAGNOSIS — D1800 Hemangioma unspecified site: Secondary | ICD-10-CM

## 2022-08-06 NOTE — Progress Notes (Signed)
Follow-Up Visit   Subjective  Adam Knight is a 81 y.o. male who presents for the following: Upper body skine exam (Hx of BCC R mid upper forehead, hx of Atypical Letiginous melanocytic proliferation with regression R temple, hx of AKs). Spot on chest gets irritated.  The patient presents for Upper Body Skin Exam (UBSE) for skin cancer screening and mole check.  The patient has spots, moles and lesions to be evaluated, some may be new or changing and the patient has concerns that these could be cancer.   The following portions of the chart were reviewed this encounter and updated as appropriate:       Review of Systems:  No other skin or systemic complaints except as noted in HPI or Assessment and Plan.  Objective  Well appearing patient in no apparent distress; mood and affect are within normal limits.  All skin waist up examined.  R upper forehead x 1, R mid cheek x 1, L zygoma x 1, L temple x 2, L nasal tip x 1, L forearm x 2, R forearm x 1 (9) Pink scaly macules  Upper chest x 1 Stuck on waxy pap with erythema    Assessment & Plan   History of Basal Cell Carcinoma of the Skin - No evidence of recurrence today - Recommend regular full body skin exams - Recommend daily broad spectrum sunscreen SPF 30+ to sun-exposed areas, reapply every 2 hours as needed.  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  Treatable, but not curable. - Violaceous macules and patches - Benign - Related to trauma, age, sun damage and/or use of blood thinners, chronic use of topical and/or oral steroids - Observe - Can use OTC arnica containing moisturizer such as Dermend Bruise Formula if desired - Call for worsening or other concerns  Lentigines - Scattered tan  macules - Due to sun exposure - Benign-appering, observe - Recommend daily broad spectrum sunscreen SPF 30+ to sun-exposed areas, reapply every 2 hours as needed. - Call for any changes - back, arms  Seborrheic Keratoses - Stuck-on, waxy, tan-brown papules and/or plaques  - Benign-appearing - Discussed benign etiology and prognosis. - Observe - Call for any changes - back, arms  Hemangiomas - Red papules - Discussed benign nature - Observe - Call for any changes  - trunk  Actinic Damage - Chronic condition, secondary to cumulative UV/sun exposure - diffuse scaly erythematous macules with underlying dyspigmentation - Recommend daily broad spectrum sunscreen SPF 30+ to sun-exposed areas, reapply every 2 hours as needed.  - Staying in the shade or wearing long sleeves, sun glasses (UVA+UVB protection) and wide brim hats (4-inch brim around the entire circumference of the hat) are also recommended for sun protection.  - Call for new or changing lesions.  Skin cancer screening performed today.   AK (actinic keratosis) (9) R upper forehead x 1, R mid cheek x 1, L zygoma x 1, L temple x 2, L nasal tip x 1, L forearm x 2, R forearm x 1  Destruction of lesion - R upper forehead x 1, R mid cheek x 1, L zygoma x 1, L temple x 2, L nasal tip x 1, L forearm x 2, R forearm x 1  Destruction method: cryotherapy   Informed consent:  discussed and consent obtained   Lesion destroyed using liquid nitrogen: Yes   Region frozen until ice ball extended beyond lesion: Yes   Outcome: patient tolerated procedure well with no complications   Post-procedure details: wound care instructions given   Additional details:  Prior to procedure, discussed risks of blister formation, small wound, skin dyspigmentation, or rare scar following cryotherapy. Recommend Vaseline ointment to treated areas while healing.   Inflamed seborrheic keratosis Upper chest x 1  Destruction of lesion - Upper chest x  1  Destruction method: cryotherapy   Informed consent: discussed and consent obtained   Lesion destroyed using liquid nitrogen: Yes   Region frozen until ice ball extended beyond lesion: Yes   Outcome: patient tolerated procedure well with no complications   Post-procedure details: wound care instructions given   Additional details:  Prior to procedure, discussed risks of blister formation, small wound, skin dyspigmentation, or rare scar following cryotherapy. Recommend Vaseline ointment to treated areas while healing.    Return in about 6 months (around 02/06/2023) for AK f/u.  I, Othelia Pulling, RMA, am acting as scribe for Brendolyn Patty, MD .  Documentation: I have reviewed the above documentation for accuracy and completeness, and I agree with the above.  Brendolyn Patty MD

## 2022-08-06 NOTE — Patient Instructions (Addendum)
Cryotherapy Aftercare  Wash gently with soap and water everyday.   Apply Vaseline and Band-Aid daily until healed.     Due to recent changes in healthcare laws, you may see results of your pathology and/or laboratory studies on MyChart before the doctors have had a chance to review them. We understand that in some cases there may be results that are confusing or concerning to you. Please understand that not all results are received at the same time and often the doctors may need to interpret multiple results in order to provide you with the best plan of care or course of treatment. Therefore, we ask that you please give us 2 business days to thoroughly review all your results before contacting the office for clarification. Should we see a critical lab result, you will be contacted sooner.   If You Need Anything After Your Visit  If you have any questions or concerns for your doctor, please call our main line at [phone] and press option 4 to reach your doctor's medical assistant. If no one answers, please leave a voicemail as directed and we will return your call as soon as possible. Messages left after 4 pm will be answered the following business day.   You may also send us a message via MyChart. We typically respond to MyChart messages within 1-2 business days.  For prescription refills, please ask your pharmacy to contact our office. Our fax number is [phone].  If you have an urgent issue when the clinic is closed that cannot wait until the next business day, you can page your doctor at the number below.    Please note that while we do our best to be available for urgent issues outside of office hours, we are not available 24/7.   If you have an urgent issue and are unable to reach us, you may choose to seek medical care at your doctor's office, retail clinic, urgent care center, or emergency room.  If you have a medical emergency, please immediately call 911 or go to the  emergency department.  Pager Numbers  - Dr. Kowalski: [phone]  - Dr. Moye: [phone]  - Dr. Stewart: [phone]  In the event of inclement weather, please call our main line at [phone] for an update on the status of any delays or closures.  Dermatology Medication Tips: Please keep the boxes that topical medications come in in order to help keep track of the instructions about where and how to use these. Pharmacies typically print the medication instructions only on the boxes and not directly on the medication tubes.   If your medication is too expensive, please contact our office at [phone] option 4 or send us a message through MyChart.   We are unable to tell what your co-pay for medications will be in advance as this is different depending on your insurance coverage. However, we may be able to find a substitute medication at lower cost or fill out paperwork to get insurance to cover a needed medication.   If a prior authorization is required to get your medication covered by your insurance company, please allow us 1-2 business days to complete this process.  Drug prices often vary depending on where the prescription is filled and some pharmacies may offer cheaper prices.  The website www.goodrx.com contains coupons for medications through different pharmacies. The prices here do not account for what the cost may be with help from insurance (it may be cheaper with your insurance), but the website can   give you the price if you did not use any insurance.  - You can print the associated coupon and take it with your prescription to the pharmacy.  - You may also stop by our office during regular business hours and pick up a GoodRx coupon card.  - If you need your prescription sent electronically to a different pharmacy, notify our office through [place] MyChart or by phone at [phone] option 4.     Si Usted Necesita Algo Despus de Su Visita  Tambin puede  enviarnos un mensaje a travs de MyChart. Por lo general respondemos a los mensajes de MyChart en el transcurso de 1 a 2 das hbiles.  Para renovar recetas, por favor pida a su farmacia que se ponga en contacto con nuestra oficina. Nuestro nmero de fax es el [phone].  Si tiene un asunto urgente cuando la clnica est cerrada y que no puede esperar hasta el siguiente da hbil, puede llamar/localizar a su doctor(a) al nmero que aparece a continuacin.   Por favor, tenga en cuenta que aunque hacemos todo lo posible para estar disponibles para asuntos urgentes fuera del horario de oficina, no estamos disponibles las 24 horas del da, los 7 das de la semana.   Si tiene un problema urgente y no puede comunicarse con nosotros, puede optar por buscar atencin mdica  en el consultorio de su doctor(a), en una clnica privada, en un centro de atencin urgente o en una sala de emergencias.  Si tiene una emergencia mdica, por favor llame inmediatamente al 911 o vaya a la sala de emergencias.  Nmeros de bper  - Dr. Kowalski: [phone]  - Dra. Moye: [phone]  - Dra. Stewart: [phone]  En caso de inclemencias del tiempo, por favor llame a nuestra lnea principal al [phone] para una actualizacin sobre el estado de cualquier retraso o cierre.  Consejos para la medicacin en dermatologa: Por favor, guarde las cajas en las que vienen los medicamentos de uso tpico para ayudarle a seguir las instrucciones sobre dnde y cmo usarlos. Las farmacias generalmente imprimen las instrucciones del medicamento slo en las cajas y no directamente en los tubos del medicamento.   Si su medicamento es muy caro, por favor, pngase en contacto con nuestra oficina llamando al [phone] y presione la opcin 4 o envenos un mensaje a travs de MyChart.   No podemos decirle cul ser su copago por los medicamentos por adelantado ya que esto es diferente dependiendo de la cobertura de su seguro.  Sin embargo, es posible que podamos encontrar un medicamento sustituto a menor costo o llenar un formulario para que el seguro cubra el medicamento que se considera necesario.   Si se requiere una autorizacin previa para que su compaa de seguros cubra su medicamento, por favor permtanos de 1 a 2 das hbiles para completar este proceso.  Los precios de los medicamentos varan con frecuencia dependiendo del lugar de dnde se surte la receta y alguna farmacias pueden ofrecer precios ms baratos.  El sitio web www.goodrx.com tiene cupones para medicamentos de diferentes farmacias. Los precios aqu no tienen en cuenta lo que podra costar con la ayuda del seguro (puede ser ms barato con su seguro), pero el sitio web puede darle el precio si no utiliz ningn seguro.  - Puede imprimir el cupn correspondiente y llevarlo con su receta a la farmacia.  - Tambin puede pasar por nuestra oficina durante el horario de atencin regular y recoger una tarjeta de cupones de GoodRx.  -   Si necesita que su receta se enve electrnicamente a una farmacia diferente, informe a nuestra oficina a travs de MyChart de [place] o por telfono llamando al [phone] y presione la opcin 4.  

## 2022-09-02 ENCOUNTER — Other Ambulatory Visit: Payer: Self-pay

## 2022-09-02 ENCOUNTER — Emergency Department: Payer: Medicare Other

## 2022-09-02 ENCOUNTER — Emergency Department
Admission: EM | Admit: 2022-09-02 | Discharge: 2022-09-02 | Disposition: A | Discharge status: Home / Self Care | Payer: Medicare Other | Attending: Emergency Medicine | Admitting: Emergency Medicine

## 2022-09-02 ENCOUNTER — Encounter: Payer: Self-pay | Admitting: Emergency Medicine

## 2022-09-02 DIAGNOSIS — X500XXA Overexertion from strenuous movement or load, initial encounter: Secondary | ICD-10-CM | POA: Diagnosis not present

## 2022-09-02 DIAGNOSIS — Y9301 Activity, walking, marching and hiking: Secondary | ICD-10-CM | POA: Diagnosis not present

## 2022-09-02 DIAGNOSIS — M25572 Pain in left ankle and joints of left foot: Secondary | ICD-10-CM | POA: Diagnosis present

## 2022-09-02 DIAGNOSIS — Z7901 Long term (current) use of anticoagulants: Secondary | ICD-10-CM | POA: Insufficient documentation

## 2022-09-02 DIAGNOSIS — N183 Chronic kidney disease, stage 3 unspecified: Secondary | ICD-10-CM | POA: Insufficient documentation

## 2022-09-02 MED ORDER — ACETAMINOPHEN 325 MG PO TABS
650.0000 mg | ORAL_TABLET | Freq: Once | ORAL | Status: AC
  Administered 2022-09-02: 650 mg via ORAL
  Filled 2022-09-02: qty 2

## 2022-09-02 NOTE — Discharge Instructions (Addendum)
Your x-rays were normal.  You may use the Ace wrap for extra support while ambulating, please remove it at night.  Continue to rest, ice, elevate your ankle.  Put a thin towel between the ice and your skin as you do not injure your skin.  Please follow-up with your outpatient provider.  Please return for any new, worsening, or change in symptoms or other concerns.

## 2022-09-02 NOTE — ED Triage Notes (Signed)
Pt reports 2 days ago was walking and his left ankle twisted. Pt reports used ice to bring the swelling down but ankle still painful.

## 2022-09-02 NOTE — ED Notes (Signed)
See triage note  Presents with pain to left ankle  States he twisted his ankle while walking  Min swelling noted   good pulses

## 2022-09-02 NOTE — ED Provider Notes (Signed)
[facility] Provider Note    Event Date/Time   First MD Initiated Contact with Patient 09/02/22 [phone]     (approximate)   History   Ankle Pain   HPI  Adam Knight is a 81 y.o. male with a past medical history of CKD stage III, pulmonary embolism on long-term anticoagulation, hyperlipidemia, obesity who presents today for evaluation of ankle pain x2 days.  Patient reports that 2 days ago he was walking and he twisted his left ankle on uneven ground.  He denies fall to the ground or head strike or LOC.  He has been able to ambulate, but noticed that he still had swelling over the side of his ankle so he came in for evaluation.  He denies numbness or tingling.  Denies any other injury sustained.  He denies any pain elsewhere.  He has not been taking anything for pain.  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   Mild cognitive impairment 05/08/2018   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   Chest pain 05/02/2018   Palpitations 05/02/2018   Long term (current) use of anticoagulants 02/18/2018   Pulmonary embolus ([place]) 01/06/2018   Primary osteoarthritis of both knees 11/10/2017   Lymphedema of both lower extremities 10/25/2017   Hyperlipidemia, mixed 06/01/2016   Mild depression 06/01/2016   Dyspnea on exertion 03/01/2016   Non morbid obesity due to excess calories 03/01/2016   PVD (peripheral vascular  disease) ([place]) 03/01/2016   Primary osteoarthritis of left knee 04/20/2015   Primary osteoarthritis of right knee 04/20/2015   BMI 40.0-44.9, adult ([place]) 04/20/2015   DOE (dyspnea on exertion) 01/26/2015   OSA on CPAP 01/26/2015   Morbid obesity ([place]) 01/26/2015   Chronic edema 01/13/2015   Hyperglycemia, unspecified 01/13/2015   Nephrolithiasis 01/13/2015   Obstructive sleep apnea 05/08/2013          Physical Exam   Triage Vital Signs: ED Triage Vitals  Enc Vitals Group     BP 09/02/22 0759 (!) 119/97     Pulse Rate 09/02/22 0759 62     Resp 09/02/22 0759 20     Temp 09/02/22 0759 98.3 F (36.8 C)     Temp Source 09/02/22 0759 Oral     SpO2 09/02/22 0759 94 %     Weight 09/02/22 0757 216 lb 0.8 oz (98 kg)     Height 09/02/22 0757 '5\' 2"'$  (1.575 m)     Head Circumference --      Peak Flow --      Pain Score 09/02/22 0757 6     Pain Loc --      Pain Edu? --      Excl. in [place]? --     Most recent vital signs: Vitals:   09/02/22 0759  BP: (!) 119/97  Pulse: 62  Resp: 20  Temp: 98.3 F (36.8 C)  SpO2: 94%    Physical Exam Vitals and nursing note reviewed.  Constitutional:      General:  Awake and alert. No acute distress.    Appearance: Normal appearance. The patient is overweight.  HENT:     Head: Normocephalic and atraumatic.     Mouth: Mucous membranes are moist.  Eyes:     General: PERRL. Normal EOMs        Right eye: No discharge.        Left eye: No discharge.     Conjunctiva/sclera: Conjunctivae normal.  Cardiovascular:     Rate and Rhythm: Normal rate and regular rhythm.     Pulses: Normal pulses.     Heart sounds: Normal heart sounds Pulmonary:     Effort: Pulmonary effort is normal. No respiratory distress.     Breath sounds: Normal breath sounds.  Abdominal:     Abdomen is soft. There is no abdominal tenderness. No rebound or guarding. No distention. Musculoskeletal:        General: No swelling. Normal range of motion.     Cervical back:  Normal range of motion and neck supple.  Left ankle: Mild TTP over lateral aspect of ankle with ecchymosis, swelling, or erythema/wounds, tenderness over the anterior talofibular ligament and posterior to lateral malleolus, no lateral or medial malleolar tenderness or proximal fifth metacarpal tenderness. No proximal fibular tenderness. 2+ pedal pulses with brisk capillary refill. Intact distal sensation and strength with normal ROM. Able to plantar flex and dorsiflex against resistance. Able to invert and evert against resistance. Negative  dorsiflexion external rotation test. Negative squeeze test. Negative Thompson test Left knee: No deformity or rash. No joint line tenderness. No patellar tenderness, no ballotment Warm and well perfused extremity with 2+ pedal pulses 5/5 strength to dorsiflexion and plantarflexion at the ankle with intact sensation throughout extremity Normal range of motion of the knee, with intact flexion and extension to active and passive range of motion. Extensor mechanism intact. No ligamentous laxity. Negative anterior/posterior drawer/negative lachman, negative mcmurrays No effusion or warmth Intact quadriceps, hamstring function, patellar tendon function Pelvis stable Full ROM of ankle without pain or swelling Foot warm and well perfused Skin:    General: Skin is warm and dry.     Capillary Refill: Capillary refill takes less than 2 seconds.     Findings: No rash.  Neurological:     Mental Status: The patient is awake and alert.      ED Results / Procedures / Treatments   Labs (all labs ordered are listed, but only abnormal results are displayed) Labs Reviewed - No data to display   EKG     RADIOLOGY I independently reviewed and interpreted imaging and agree with radiologists findings.     PROCEDURES:  Critical Care performed:   Procedures   MEDICATIONS ORDERED IN ED: Medications  acetaminophen (TYLENOL) tablet 650 mg (650 mg Oral Given  09/02/22 4656)     IMPRESSION / MDM / ASSESSMENT AND PLAN / ED COURSE  I reviewed the triage vital signs and the nursing notes.   Differential diagnosis includes, but is not limited to, ankle fracture, ankle sprain, contusion. Patient has mild swelling and tenderness to palpation over lateral ankle.  Therefore per [place] ankle rules x-ray was obtained.  There is no evidence of fracture on x-ray.  There is no tenderness to proximal fibula that would be concerning for occult fracture.  There is no knee pain or swelling and no ligamental laxity, do not suspect knee injury.  There is no proximal fifth metatarsal tenderness concerning for Jones fracture.  Xrays of foot and tib fib  were normal. Negative squeeze test so do not suspect high ankle sprain.  Negative Thompson test, do not suspect Achilles tendon rupture. Patient is able to bear weight with pain.  Patient was given ace wrap for extra support, advised to remove it at night.  We discussed [place] and outpatient follow-up.  Patient was treated symptomatically in the emergency department with improvement of symptoms.  He is able to ambulate. Patient understands and agrees with plan.    Patient's presentation is most consistent with acute complicated illness / injury requiring diagnostic workup.      FINAL CLINICAL IMPRESSION(S) / ED DIAGNOSES   Final diagnoses:  Acute left ankle pain     Rx / DC Orders   ED Discharge Orders     None        Note:  This document was prepared using Dragon voice recognition software and may include unintentional dictation errors.   Marquette Old, PA-C 09/02/22 [id number]    Blake Divine, MD 09/02/22 [phone]

## 2022-09-05 ENCOUNTER — Ambulatory Visit: Payer: Medicare Other | Attending: Internal Medicine

## 2022-09-05 DIAGNOSIS — Z5181 Encounter for therapeutic drug level monitoring: Secondary | ICD-10-CM | POA: Insufficient documentation

## 2022-09-05 DIAGNOSIS — I2602 Saddle embolus of pulmonary artery with acute cor pulmonale: Secondary | ICD-10-CM | POA: Diagnosis not present

## 2022-09-05 LAB — POCT INR: INR: 2.5 (ref 2.0–3.0)

## 2022-09-05 NOTE — Patient Instructions (Signed)
-   continue dosage of warfarin of 1 tablet (5 mg) every day EXCEPT 1/2 tablet on MONDAYS & FRIDAYS - Recheck INR in 6 weeks 

## 2022-09-19 IMAGING — US US EXTREM LOW VENOUS*R*
1 series · 13 of 24 positions shown · non-contrast
Comparison: None Available.

CLINICAL DATA: Right lower extremity pain and edema.



[Series 1: us venous img lower uni right (dvt) · portal-venous · 13 of 33 slices shown]
[im 1/33]
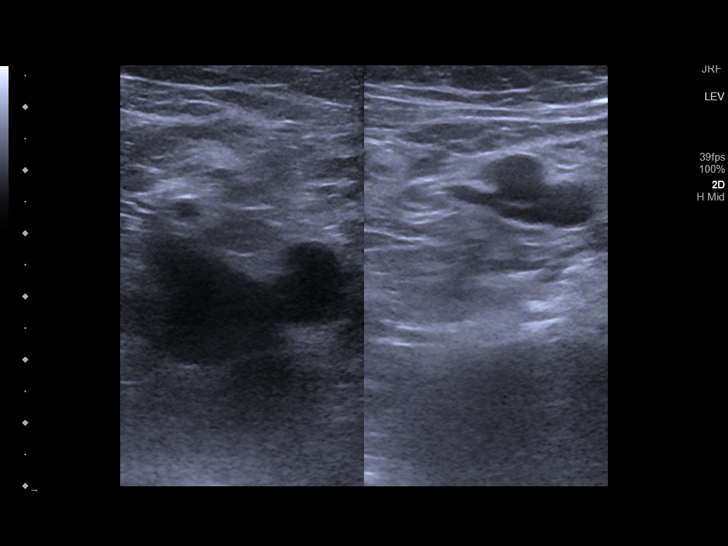
[im 3/33]
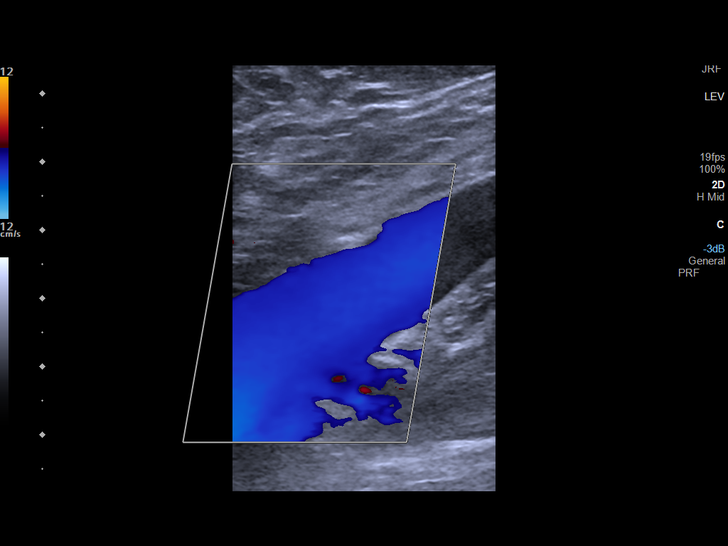
[im 6/33]
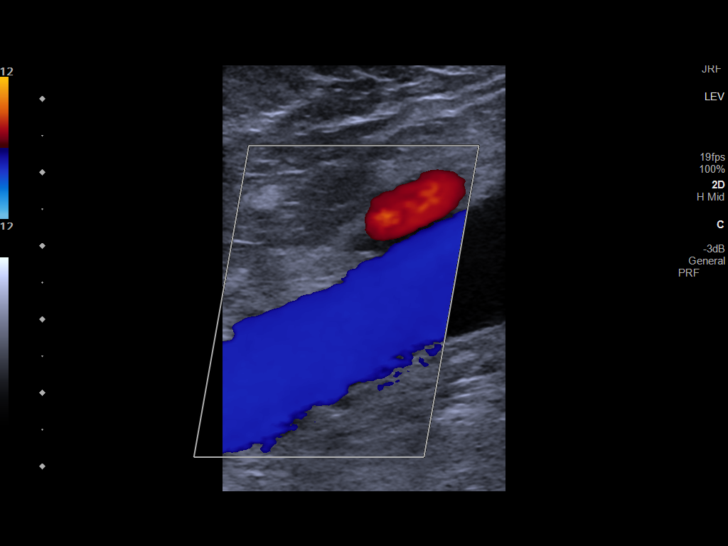
[im 9/33]
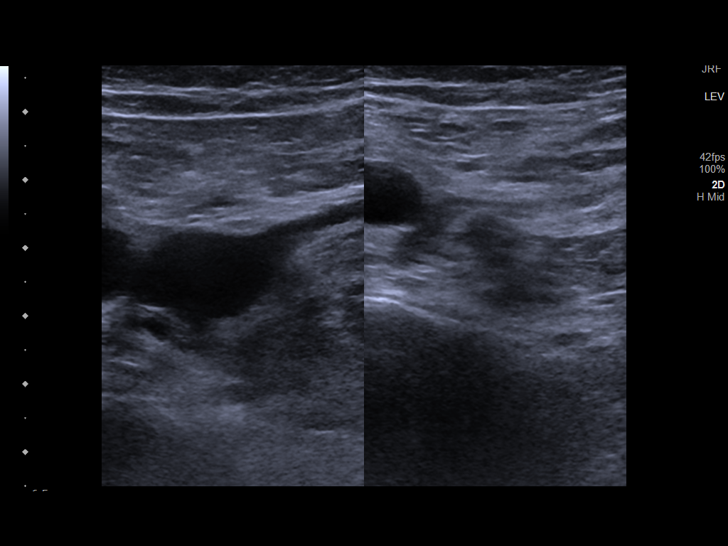
[im 12/33]
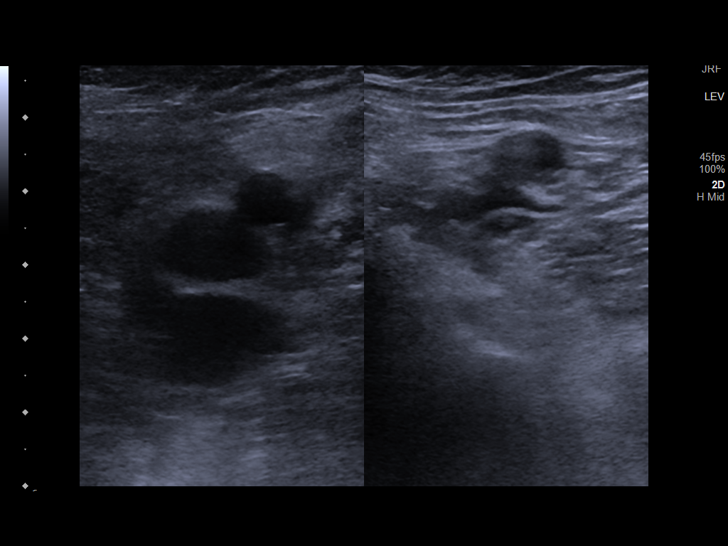
[im 14/33]
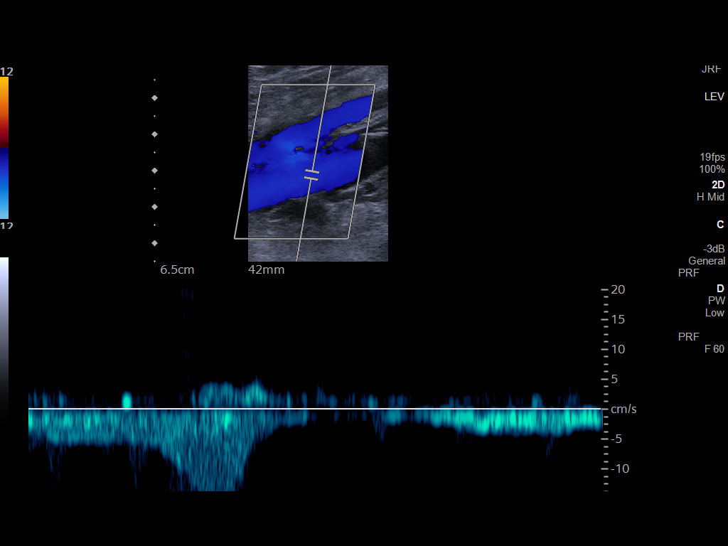
[im 17/33]
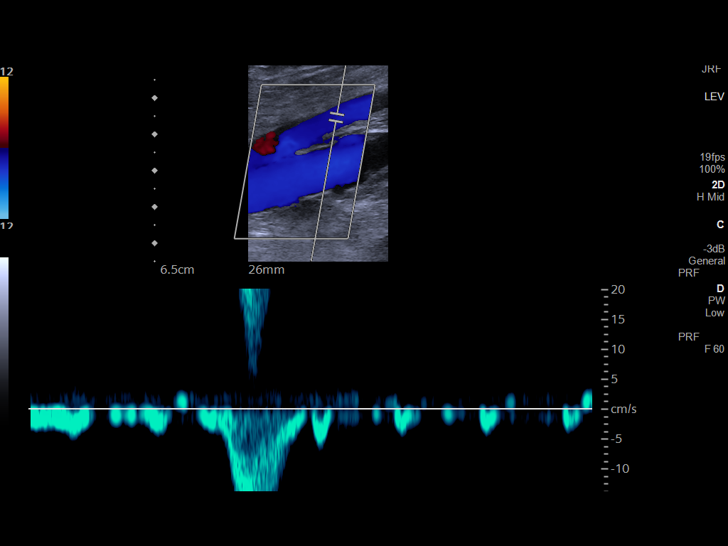
[im 19/33]
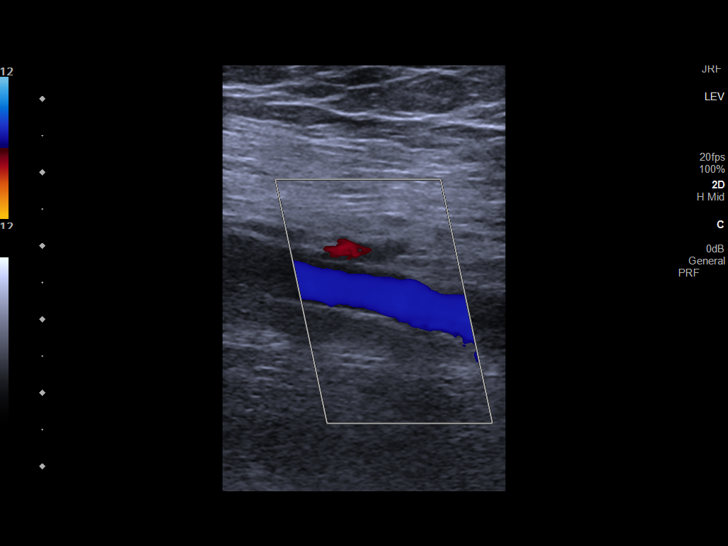
[im 21/33]
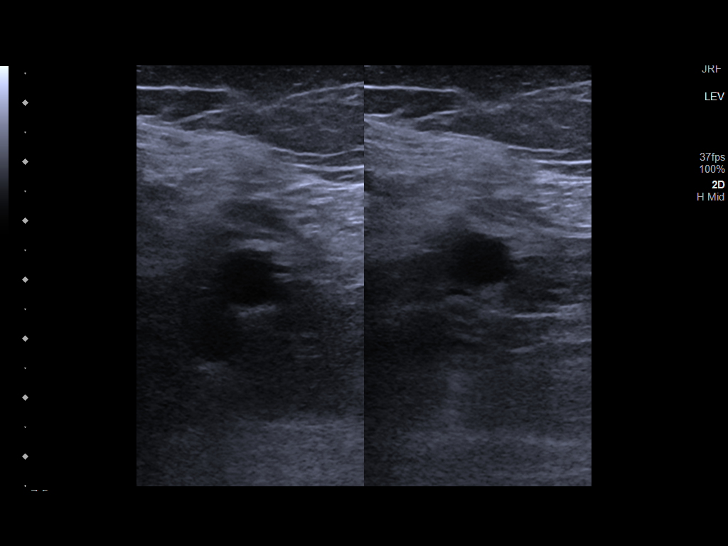
[im 24/33]
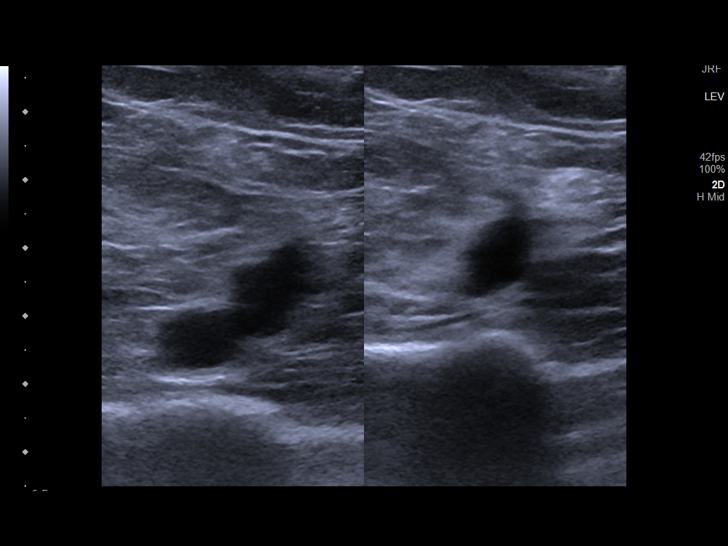
[im 27/33]
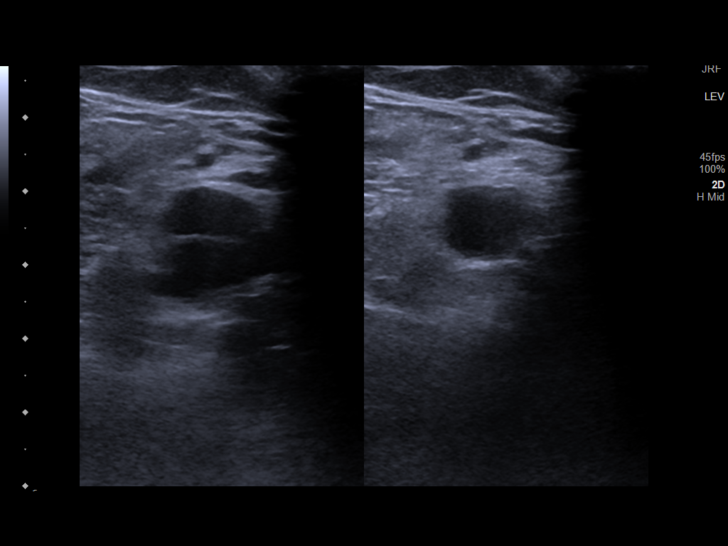
[im 30/33]
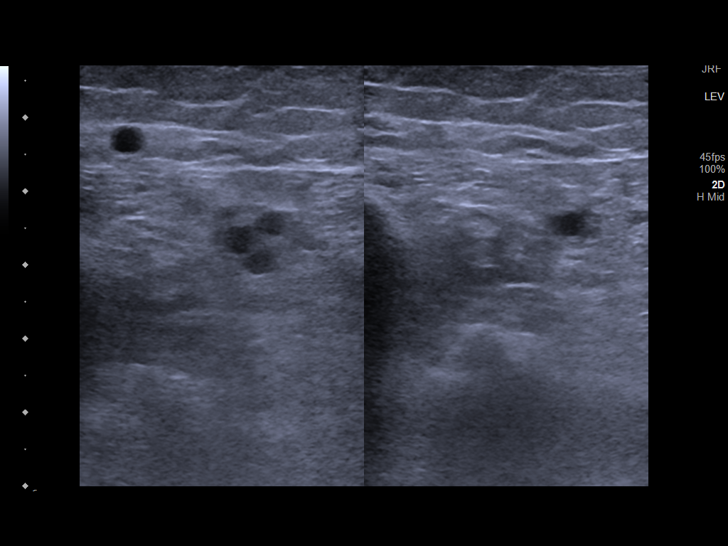
[im 33/33]
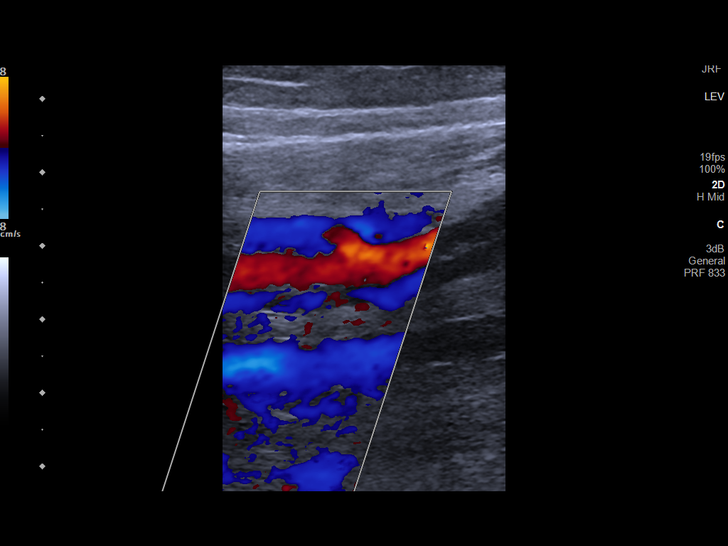

[13 of 24 positions shown; findings below may reference images not displayed]

FINDINGS: Contralateral Common Femoral Vein: Respiratory phasicity is normal
and symmetric with the symptomatic side. No evidence of thrombus.
Normal compressibility.

Common Femoral Vein: No evidence of thrombus. Normal
compressibility, respiratory phasicity and response to augmentation.

Saphenofemoral Junction: No evidence of thrombus. Normal
compressibility and flow on color Doppler imaging.

Profunda Femoral Vein: No evidence of thrombus. Normal
compressibility and flow on color Doppler imaging.

Femoral Vein: No evidence of thrombus. Normal compressibility,
respiratory phasicity and response to augmentation.

Popliteal Vein: No evidence of thrombus. Normal compressibility,
respiratory phasicity and response to augmentation.

Calf Veins: No evidence of thrombus. Normal compressibility and flow
on color Doppler imaging.

Superficial Great Saphenous Vein: No evidence of thrombus. Normal
compressibility.

Venous Reflux:  None.

Other Findings: No evidence of superficial thrombophlebitis or
abnormal fluid collection.
IMPRESSION: No evidence of right lower extremity deep venous thrombosis.

## 2022-09-27 ENCOUNTER — Telehealth: Payer: Self-pay | Admitting: Internal Medicine

## 2022-09-27 DIAGNOSIS — I2724 Chronic thromboembolic pulmonary hypertension: Secondary | ICD-10-CM

## 2022-09-27 DIAGNOSIS — Z86711 Personal history of pulmonary embolism: Secondary | ICD-10-CM

## 2022-09-27 NOTE — Telephone Encounter (Signed)
Calling for a referral to pulmonary rehab center. Please advise

## 2022-09-27 NOTE — Telephone Encounter (Signed)
Patient last seen 05/16/22 with Dr. Saunders Revel.  To Dr. Saunders Revel to review the patient's request.

## 2022-09-28 NOTE — Telephone Encounter (Signed)
Referral has been placed. Left message for patient to make him aware.

## 2022-09-28 NOTE — Telephone Encounter (Signed)
Please place a referral to pulmonary rehab for Adam Knight.  Diagnosis is chronic thromboembolic pulmonary hypertension.  Thanks.  Nelva Bush, MD [facility]

## 2022-10-08 ENCOUNTER — Telehealth: Payer: Self-pay | Admitting: Internal Medicine

## 2022-10-08 NOTE — Telephone Encounter (Signed)
End, Harrell Gave, MD  P Cv Div [place] Triage Please let Mr. Levingston know that he does not qualify for pulmonary rehab.  If he wishes to exercise in a supervised fashion, I would encourage him to join the [employer].   Gerald Stabs        Previous Messages    ----- Message -----  From: Lynford Humphrey, RN  Sent: 10/03/2022   1:35 PM EDT  To: Nelva Bush, MD  Subject: Referral  for Adam Knight afternoon,  Tkai attended Pulmonary Rehab 2/21-9/21 for this diagnosis.  He also has completed  PT in May 2023  , stating decline since his PE. He would be a candidate for more PT or the  [place] to continue his exercise routine.  [facility] can be reached at [phone].    Thank you , Wynona Canes

## 2022-10-08 NOTE — Telephone Encounter (Signed)
Attempted to call the [facility] at [facility] at [phone] to inquire what is the process the patient would need to follow to connect with the [facility]. No answer- I left a message to please call back.

## 2022-10-10 NOTE — Telephone Encounter (Signed)
Received a call from [facility] and was informed that all the patient needed to do was come in to their location and they can get him signed up.   Called patient and informed him of this option and he stated that he does not want to go there. He stated that he has been there once before and they did not treat him right.  Patient thanked me for the follow up phone call.

## 2022-10-12 ENCOUNTER — Other Ambulatory Visit: Payer: Self-pay | Admitting: Internal Medicine

## 2022-10-12 NOTE — Telephone Encounter (Signed)
Refill Request.  

## 2022-10-12 NOTE — Telephone Encounter (Signed)
Prescription refill request received for warfarin  Lov: End, 05/16/2022 Next INR check: 10/17/2022 Warfarin tablet strength: '5mg'$    Refill sent.

## 2022-10-17 ENCOUNTER — Telehealth: Payer: Self-pay | Admitting: Internal Medicine

## 2022-10-17 ENCOUNTER — Ambulatory Visit: Payer: Medicare Other | Attending: Internal Medicine

## 2022-10-17 DIAGNOSIS — I2602 Saddle embolus of pulmonary artery with acute cor pulmonale: Secondary | ICD-10-CM

## 2022-10-17 DIAGNOSIS — Z5181 Encounter for therapeutic drug level monitoring: Secondary | ICD-10-CM

## 2022-10-17 LAB — POCT INR: INR: 2.2 (ref 2.0–3.0)

## 2022-10-17 NOTE — Telephone Encounter (Signed)
Hi there, I am a nurse from the [place] team helping with [facility] triage, not sure who your nurse is to route this to but wanted to make you aware this is at the office to be filled out for the patient. Thanks!

## 2022-10-17 NOTE — Telephone Encounter (Signed)
Form pulled from nurse bin.  Completed & signed by Dr. Saunders Revel.   Attempted to call the patient. No answer- I left a message on his identified voice mail that his form is completed and ready for pick up at our front desk.   Form placed at the front desk in an envelope with the patient's name on it.

## 2022-10-17 NOTE — Telephone Encounter (Signed)
Patient dropped off Renewal of Disability Parking Placard paperwork to be signed Placed in nurse box

## 2022-10-17 NOTE — Patient Instructions (Signed)
-   continue dosage of warfarin of 1 tablet (5 mg) every day EXCEPT 1/2 tablet on MONDAYS & FRIDAYS - Recheck INR in 5 weeks

## 2022-10-17 NOTE — Telephone Encounter (Signed)
Thank you for the info.  We will keep an eye out for the disability parking form and notify Mr. Colter when it has been completed.  Nelva Bush, MD [facility]

## 2022-11-13 ENCOUNTER — Ambulatory Visit: Payer: Medicare Other | Admitting: Orthopaedic Surgery

## 2022-11-19 NOTE — Progress Notes (Unsigned)
Follow-up Outpatient Visit Date: 11/21/2022  Primary Care Provider: Barbaraann Boys, [place] [facility] [place] [address]  Chief Complaint: Follow-up CTEPH  HPI:  Adam Knight is a 81 y.o. 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  I last saw him in May, at which time he continued to complain of fatigue and exertional dyspnea.  We discussed moving forward with a right heart catheterization, though Adam Knight wished for me to discuss this with Dr. Marijean Bravo.  Attempts to reach Dr. Marijean Bravo were unsuccessful.  Today, Adam Knight reports feeling about the same as at our prior visits.  He continues to have exertional dyspnea with modest activity.  He has not been able to walk as much due to joint problems involving his knees and ankles.  He notes that one of his knees gave out a couple of months ago and led him to twist his ankle.  He has been following with orthopedics and is due to see them again next week.  He has not followed with Dr. Marijean Bravo since our last visit.  He is following with [facility] nephrology in [place] as well as [facility] heme/onc.  Adam Knight notes sporadic episodes of transient left-sided chest pain that is not exertional or related to other specific activities.  He also denies palpitations and lightheadedness.  Chronic leg edema is unchanged.  --------------------------------------------------------------------------------------------------  Past Medical History:  Diagnosis Date   Actinic keratosis    Atypical mole 04/18/2020   R temple Atypical Letiginous melanocytic proliferation with regression, clear post biopsy   Basal cell carcinoma 04/18/2020   Right mid upper forehead, MOHs 06/22/20   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HCC)    Dyslipidemia    Edema     Gastric ulcer    OSA on CPAP    Prediabetes    Pulmonary embolism (HCC)    Seasonal allergies    Past Surgical History:  Procedure Laterality Date   ENDARTERECTOMY  10/2018   Pulmonary endarterectomy for CTEPH - [facility]   PULMONARY VENOGRAPHY Bilateral 01/08/2018   Procedure: PULMONARY VENOGRAPHY possible thrombolysis;  Surgeon: Katha Cabal, MD;  Location: [place] [facility];  Service: Cardiovascular;  Laterality: Bilateral;   TONSILLECTOMY      Current Meds  Medication Sig   Calcium Carbonate Antacid (CALCIUM CARBONATE, DOSED IN MG ELEMENTAL CALCIUM,) 1250 MG/5ML SUSP Take by mouth daily.   Cholecalciferol (VITAMIN D) 2000 UNITS tablet Take 2,000 Units by mouth daily.   potassium chloride (KLOR-CON) 10 MEQ tablet Take by mouth. 40 MEQ IN THE MORNING AND 30 MEQ AT NIGHT.   torsemide (DEMADEX) 20 MG tablet Take 2 tablets (40 mg total) by mouth daily.   warfarin (COUMADIN) 5 MG tablet TAKE 1/2 TO 1 TABLET BY MOUTH DAILY AS DIRECTED BY THE COUMADIN CLINIC.    Allergies: Sulfa antibiotics, Spironolactone, and Penicillins  Social History   Tobacco Use   Smoking status: Former    Packs/day: 0.30    Years: 7.00    Total pack years: 2.10    Types: Cigarettes    Quit date: 32    Years since quitting: 56.9   Smokeless tobacco: Never  Vaping Use   Vaping Use: Never used  Substance Use Topics   Alcohol use: Not Currently   Drug use: No    Family History  Problem Relation  Age of Onset   Stroke Mother    Coronary artery disease Mother    Osteoporosis Mother    Rheum arthritis Mother    Thyroid disease Mother     Review of Systems: A 12-system review of systems was performed and was negative except as noted in the HPI.  --------------------------------------------------------------------------------------------------  Physical Exam: BP (!) 140/84 (BP Location: Left Arm, Patient Position: Sitting, Cuff Size: Large)   Pulse (!) 101   Ht '5\' 2"'$  (1.575 m)   Wt 227  lb (103 kg)   SpO2 95%   BMI 41.52 kg/m   General:  NAD. Neck: No JVD or HJR.  Faint right carotid bruit noted. Lungs: Clear to auscultation bilaterally without wheezes or crackles. Heart: Tachycardic but regular without murmurs, rubs, or gallops. Abdomen: Soft, nontender, nondistended. Extremities: Trace calf edema bilaterally.  EKG: Sinus tachycardia with left axis deviation, right bundle branch block, and anterolateral T wave inversions.  Heart rate has increased since 05/16/2022.  Otherwise, no significant interval change.  Lab Results  Component Value Date   WBC 9.6 06/14/2022   HGB 14.7 06/14/2022   HCT 44.2 06/14/2022   MCV 85.7 06/14/2022   PLT 410 (H) 06/14/2022    Lab Results  Component Value Date   NA 135 06/14/2022   K 3.5 06/14/2022   CL 98 06/14/2022   CO2 26 06/14/2022   BUN 26 (H) 06/14/2022   CREATININE 1.44 (H) 06/14/2022   GLUCOSE 140 (H) 06/14/2022   ALT 16 06/14/2022    --------------------------------------------------------------------------------------------------  ASSESSMENT AND PLAN: Dyspnea on exertion and CTEPH: Overall, Adam Knight feels similar to prior visits with NYHA class III symptoms.  He has not followed with Dr. Marijean Bravo, his pulmonary hypertension specialist, since our last visit in May.  I attempted to reach out to Dr. Barbera Setters office after our last visit as well but did not receive a response.  Most recent echocardiogram at [facility] in 03/2022 showed normal LV size and function and mildly dilated RV with mildly reduced function.  PA pressure could not be estimated.  We discussed continued medical therapy versus right heart catheterization to better understand Adam Knight's hemodynamics.  He is concerned about his kidneys; I reassured him that right heart catheterization does not involve contrast and would not affect his renal function.  We discussed the risks and benefits of right heart catheterization and have agreed to move forward with this next  week.  In the meantime, we will continue his current medications with the exception of brief interruption of chronic warfarin therapy to minimize risk for bleeding with the right heart catheterization.  I have reached out to our anticoagulation team to help assist with this.  Chest pain: This is atypical.  Given concerns about renal function and low risk stress test a year ago, we will not proceed with coronary angiography.  Addition of statin therapy should be considered in the future, however, given LDL of 163 and triglycerides of 183 on last check in 05/2022 through PCP.  Sinus tachycardia: Heart rate mildly elevated today.  Adam Knight does not report any new symptoms.  However, in anticipation of upcoming right heart catheterization and elevated heart rates, I will check a CBC, BMP, and TSH today.  Carotid bruit: Incidentally noted on exam today.  No stroke/TIA symptoms reported.  Will obtain carotid Dopplers at [employer].  Statin therapy to be considered based on lipid panel noted above; evidence of carotid plaque would further emphasize this.  We will readdress  this after completion of the carotid Doppler.  Chronic kidney disease stage IIIa: Ongoing follow-up with [facility] nephrology.  Avoid nephrotoxic agents when possible.  Follow-up: Return to clinic in 1 month.  Nelva Bush, MD 11/21/2022 9:39 AM

## 2022-11-21 ENCOUNTER — Ambulatory Visit: Payer: Medicare Other | Attending: Internal Medicine | Admitting: Internal Medicine

## 2022-11-21 ENCOUNTER — Telehealth: Payer: Self-pay | Admitting: Pharmacist

## 2022-11-21 ENCOUNTER — Encounter: Payer: Self-pay | Admitting: Internal Medicine

## 2022-11-21 ENCOUNTER — Ambulatory Visit (INDEPENDENT_AMBULATORY_CARE_PROVIDER_SITE_OTHER): Payer: Medicare Other

## 2022-11-21 VITALS — BP 140/84 | HR 101 | Ht 62.0 in | Wt 227.0 lb

## 2022-11-21 DIAGNOSIS — R0989 Other specified symptoms and signs involving the circulatory and respiratory systems: Secondary | ICD-10-CM | POA: Diagnosis present

## 2022-11-21 DIAGNOSIS — I2602 Saddle embolus of pulmonary artery with acute cor pulmonale: Secondary | ICD-10-CM | POA: Diagnosis present

## 2022-11-21 DIAGNOSIS — R000 Tachycardia, unspecified: Secondary | ICD-10-CM

## 2022-11-21 DIAGNOSIS — Z01812 Encounter for preprocedural laboratory examination: Secondary | ICD-10-CM | POA: Diagnosis present

## 2022-11-21 DIAGNOSIS — I2724 Chronic thromboembolic pulmonary hypertension: Secondary | ICD-10-CM | POA: Diagnosis present

## 2022-11-21 DIAGNOSIS — N1831 Chronic kidney disease, stage 3a: Secondary | ICD-10-CM | POA: Diagnosis present

## 2022-11-21 DIAGNOSIS — R0789 Other chest pain: Secondary | ICD-10-CM | POA: Diagnosis not present

## 2022-11-21 DIAGNOSIS — Z5181 Encounter for therapeutic drug level monitoring: Secondary | ICD-10-CM

## 2022-11-21 DIAGNOSIS — R0609 Other forms of dyspnea: Secondary | ICD-10-CM | POA: Diagnosis not present

## 2022-11-21 LAB — POCT INR: INR: 2.6 (ref 2.0–3.0)

## 2022-11-21 MED ORDER — SODIUM CHLORIDE 0.9% FLUSH: 3.0000 mL | Freq: Two times a day (BID) | INTRAVENOUS | Status: DC

## 2022-11-21 NOTE — Telephone Encounter (Signed)
I spoke with patient to inform him of Coumadin instructions and he has decided not to have Right Heart Cath next Tuesday 12/5.

## 2022-11-21 NOTE — Telephone Encounter (Signed)
Patient takes warfarin 2.'5mg'$  Mondays and Fridays, '5mg'$  all other days of the week. INR today 2.6. Recommend patient hold warfarin Sunday and Monday which should achieve an INR between 1.0-2.0

## 2022-11-21 NOTE — Telephone Encounter (Signed)
Attempted to contact pt.  Left message to call back.  

## 2022-11-21 NOTE — Patient Instructions (Signed)
-   continue dosage of warfarin of 1 tablet (5 mg) every day EXCEPT 1/2 tablet on MONDAYS & FRIDAYS - Recheck INR in 6 weeks

## 2022-11-21 NOTE — Patient Instructions (Addendum)
Medication Instructions:  Your Physician recommend you continue on your current medication as directed.    *If you need a refill on your cardiac medications before your next appointment, please call your pharmacy*   Lab Work: Your provider would like for you to have the following labs drawn today: (CBC, BMP, TSH).   Please go to the [facility] entrance and check in at the front desk.  You do not need an appointment.  They are open from 7am-6 pm.  You will not need to be fasting.  If you have labs (blood work) drawn today and your tests are completely normal, you will receive your results only by: [place] (if you have MyChart) OR A paper copy in the mail If you have any lab test that is abnormal or we need to change your treatment, we will call you to review the results.   Testing/Procedures: Your physician has requested that you have a cardiac catheterization. Cardiac catheterization is used to diagnose and/or treat various heart conditions. Doctors may recommend this procedure for a number of different reasons. The most common reason is to evaluate chest pain. Chest pain can be a symptom of coronary artery disease (CAD), and cardiac catheterization can show whether plaque is narrowing or blocking your heart's arteries. This procedure is also used to evaluate the valves, as well as measure the blood flow and oxygen levels in different parts of your heart. For further information please visit [email or web address].[email or web address]. Please follow instruction sheet, as given.  Your physician has requested that you have a carotid duplex. This test is an ultrasound of the carotid arteries in your neck. It looks at blood flow through these arteries that supply the brain with blood.   Allow one hour for this exam.  There are no restrictions or special instructions.  This will take place at [facility] [id number], [place]    Follow-Up: At [facility],  you and your health needs are our priority.  As part of our continuing mission to provide you with exceptional heart care, we have created designated Provider Care Teams.  These Care Teams include your primary Cardiologist (physician) and Advanced Practice Providers (APPs -  Physician Assistants and Nurse Practitioners) who all work together to provide you with the care you need, when you need it.  We recommend signing up for the patient portal called "MyChart".  Sign up information is provided on this After Visit Summary.  MyChart is used to connect with patients for Virtual Visits (Telemedicine).  Patients are able to view lab/test results, encounter notes, upcoming appointments, etc.  Non-urgent messages can be sent to your provider as well.   To learn more about what you can do with MyChart, go to [email or web address].    Your next appointment:   1 month(s)  The format for your next appointment:   In Person  Provider:   Nelva Bush, MD      [facility] [address] [place] [address] Dept: [phone] Loc: [phone]   Cardiac/Peripheral Catheterization   You are scheduled for a Cardiac Catheterization on Tuesday, December 5 with Dr. Harrell Gave End.  1. Arrive at the [facility] entrance at 9:30 am, one hour prior to your procedure. Free valet service is available.  After entering the [facility] please check-in at the registration desk (1st desk on your right) to receive your armband. After receiving your armband someone will escort you to the cardiac cath/special procedures  waiting area. The support person will be asked to wait in the waiting room.  It is OK to have someone drop you off and come back when you are ready to be discharged.        Special note: Every effort is made to have your procedure done on time. Please understand that emergencies sometimes delay scheduled procedures.   . 2. Diet: Do not eat solid foods after  midnight.  You may have clear liquids until 5 AM the day of the procedure.  3. Labs: You will need to have blood drawn today (CBC, BMP, TSH)  4. Medication instructions in preparation for your procedure:   Contrast Allergy: No  Hold Torsemide morning of procedure  Will call you regarding instructions to hold coumadin   On the morning of your procedure, take Aspirin 81 mg and any morning medicines NOT listed above.  You may use sips of water.  5. Plan to go home the same day, you will only stay overnight if medically necessary. 6. You MUST have a responsible adult to drive you home. 7. An adult MUST be with you the first 24 hours after you arrive home. 8. Bring a current list of your medications, and the last time and date medication taken. 9. Bring ID and current insurance cards. 10.Please wear clothes that are easy to get on and off and wear slip-on shoes.  Thank you for allowing [place] to care for you!   -- [facility]

## 2022-11-21 NOTE — Telephone Encounter (Signed)
-----   Message from Nelva Bush, MD sent at 11/21/2022 11:48 AM EST ----- Regarding: Warfarin dosing Good morning,  I would greatly appreciate your help with periprocedural management of Adam Knight's warfarin.  He is scheduled for a RHC next Tuesday (12/5).  His INR today was 2.6.  Since we will only be getting venous access, we don't need to allow his INR to completely normalize.  However, I would prefer to have his INR be below 2.  Thanks in advance for your guidance.  Chris End

## 2022-11-23 NOTE — Telephone Encounter (Signed)
Called pt to confirm he wanted to cancel cath scheduled for Tuesday 12/5. Pt voiced he wishes not to proceed. Nurse asked pt if he had any concerns or questions he would like to discuss with MD or would he like to reschedule. Pt declined.  Cath Lab notified.

## 2022-11-27 ENCOUNTER — Encounter: Admission: RE | Payer: Self-pay | Source: Home / Self Care

## 2022-11-27 ENCOUNTER — Ambulatory Visit (HOSPITAL_COMMUNITY)
Admission: RE | Admit: 2022-11-27 | Discharge: 2022-11-27 | Disposition: A | Discharge status: Home / Self Care | Payer: Medicare Other | Source: Ambulatory Visit | Attending: Cardiology | Admitting: Cardiology

## 2022-11-27 ENCOUNTER — Ambulatory Visit: Admission: RE | Admit: 2022-11-27 | Payer: Medicare Other | Source: Home / Self Care | Admitting: Internal Medicine

## 2022-11-27 DIAGNOSIS — R0989 Other specified symptoms and signs involving the circulatory and respiratory systems: Secondary | ICD-10-CM | POA: Diagnosis present

## 2022-11-27 DIAGNOSIS — R079 Chest pain, unspecified: Secondary | ICD-10-CM

## 2022-11-27 SURGERY — RIGHT HEART CATH
Anesthesia: Moderate Sedation | Laterality: Right

## 2022-11-28 ENCOUNTER — Ambulatory Visit: Payer: Medicare Other | Admitting: Orthopaedic Surgery

## 2022-12-26 ENCOUNTER — Ambulatory Visit (INDEPENDENT_AMBULATORY_CARE_PROVIDER_SITE_OTHER): Payer: Medicare Other

## 2022-12-26 ENCOUNTER — Encounter: Payer: Self-pay | Admitting: Orthopaedic Surgery

## 2022-12-26 ENCOUNTER — Ambulatory Visit: Payer: Self-pay

## 2022-12-26 ENCOUNTER — Ambulatory Visit (INDEPENDENT_AMBULATORY_CARE_PROVIDER_SITE_OTHER): Payer: Medicare Other | Admitting: Orthopaedic Surgery

## 2022-12-26 VITALS — Wt 227.0 lb

## 2022-12-26 DIAGNOSIS — M1712 Unilateral primary osteoarthritis, left knee: Secondary | ICD-10-CM | POA: Diagnosis not present

## 2022-12-26 DIAGNOSIS — M1711 Unilateral primary osteoarthritis, right knee: Secondary | ICD-10-CM | POA: Diagnosis not present

## 2022-12-26 NOTE — Progress Notes (Signed)
The patient is someone I am seeing for the first time.  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  He said that I did perform knee replacement surgery on someone who lives on the street and that patient recommended him seeing me.  He has had steroid injections in his knees with the last being in September of this past year.  His left knee hurts him worse than the right knee.  He does ambulate with a cane.  He does have significant cardiac issues and I was able to review notes in epic especially from Dr. Saunders Revel his cardiologist.  He does have chronic renal insufficiency as well as is on Coumadin with previous history of pulmonary embolus.  He has had cardiac surgery in 2019.  He is on significant medications as well.  He has developed also urinary issues in the last 24 hours with frequency issues and difficulty with holding his urine in.  On exam both knees have varus malalignment and significant grinding with flexion extension and severe pain along medial and lateral joint lines of both knees.  There is significant patellofemoral crepitation as well.  X-rays of both knees show severe end-stage arthritis with bone-on-bone wear and osteophytes in all 3 compartments.  Both knees have varus malalignment and end-stage arthritis which is tricompartmental and again severe.  He does need to see his primary care physician or at least call their office today to let them know about his urinary frequency issues that have just started up.  I did give him a note to give to his cardiologist about considering clearance for knee replacement surgery.  If he does get cleared for this type of surgery we would need him off of Coumadin for at least 5 days prior to surgery.  I would also see him back in the office before scheduling surgery in order to go over knee replacement surgery in detail and showed him a knee replacement model.  The patient does agree with this treatment plan.  He will be  in touch once he sees his cardiologist.

## 2022-12-27 ENCOUNTER — Encounter: Payer: Self-pay | Admitting: Internal Medicine

## 2022-12-27 ENCOUNTER — Ambulatory Visit: Payer: Medicare Other | Attending: Internal Medicine | Admitting: Internal Medicine

## 2022-12-27 NOTE — Progress Notes (Deleted)
Follow-up Outpatient Visit Date: 12/27/2022  Primary Care Provider: Barbaraann Boys, MD [address] [facility] [place] [address]  Chief Complaint: ***  HPI:  Adam Knight is a 82 y.o. 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  I last saw him in late November, at which time Adam Knight continued to complain of exertional dyspnea with modest activity.  He also noted an episode of transient left-sided chest pain before our last visit that was felt to be noncardiac.  We discussed moving forward with right heart catheterization, which Adam Knight initially agreed to.  However, he subsequently contacted our office and asked to cancel the procedure.  Carotid Doppler was performed due to carotid bruit; no significant stenosis was noted in either carotid artery.  --------------------------------------------------------------------------------------------------  Past Medical History:  Diagnosis Date   Actinic keratosis    Atypical mole 04/18/2020   R temple Atypical Letiginous melanocytic proliferation with regression, clear post biopsy   Basal cell carcinoma 04/18/2020   Right mid upper forehead, MOHs 06/22/20   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HCC)    Dyslipidemia    Edema    Gastric ulcer    OSA on CPAP    Prediabetes    Pulmonary embolism (HCC)    Seasonal allergies    Past Surgical History:  Procedure Laterality Date   ENDARTERECTOMY  10/2018   Pulmonary endarterectomy for CTEPH - [facility]   PULMONARY VENOGRAPHY Bilateral 01/08/2018   Procedure: PULMONARY VENOGRAPHY possible thrombolysis;  Surgeon: Katha Cabal, MD;  Location: [facility];  Service: Cardiovascular;  Laterality: Bilateral;   TONSILLECTOMY      No outpatient medications have been  marked as taking for the 12/27/22 encounter (Appointment) with Abigail Teall, Harrell Gave, MD.    Allergies: Sulfa antibiotics, Spironolactone, and Penicillins  Social History   Tobacco Use   Smoking status: Former    Packs/day: 0.30    Years: 7.00    Total pack years: 2.10    Types: Cigarettes    Quit date: 101    Years since quitting: 57.0   Smokeless tobacco: Never  Vaping Use   Vaping Use: Never used  Substance Use Topics   Alcohol use: Not Currently   Drug use: No    Family History  Problem Relation Age of Onset   Stroke Mother    Coronary artery disease Mother    Osteoporosis Mother    Rheum arthritis Mother    Thyroid disease Mother     Review of Systems: A 12-system review of systems was performed and was negative except as noted in the HPI.  --------------------------------------------------------------------------------------------------  Physical Exam: There were no vitals taken for this visit.  General:  NAD. Neck: No JVD or HJR. Lungs: Clear to auscultation bilaterally without wheezes or crackles. Heart: Regular rate and rhythm without murmurs, rubs, or gallops. Abdomen: Soft, nontender, nondistended. Extremities: No lower extremity edema.  EKG:  ***  Lab Results  Component Value Date   WBC 9.6 06/14/2022   HGB 14.7 06/14/2022   HCT 44.2 06/14/2022   MCV 85.7 06/14/2022   PLT 410 (H) 06/14/2022    Lab Results  Component Value Date   NA 135 06/14/2022   K 3.5 06/14/2022   CL 98 06/14/2022   CO2 26 06/14/2022   BUN 26 (H) 06/14/2022   CREATININE 1.44 (H)  06/14/2022   GLUCOSE 140 (H) 06/14/2022   ALT 16 06/14/2022    No results found for: "CHOL", "HDL", "[place]", "LDLDIRECT", "TRIG", "CHOLHDL"  --------------------------------------------------------------------------------------------------  ASSESSMENT AND PLAN: ***  Nelva Bush, MD 12/27/2022 7:07 AM

## 2023-01-02 ENCOUNTER — Ambulatory Visit: Payer: Medicare Other | Attending: Internal Medicine

## 2023-01-02 DIAGNOSIS — I2602 Saddle embolus of pulmonary artery with acute cor pulmonale: Secondary | ICD-10-CM

## 2023-01-02 DIAGNOSIS — Z5181 Encounter for therapeutic drug level monitoring: Secondary | ICD-10-CM

## 2023-01-02 LAB — POCT INR: INR: 2.6 (ref 2.0–3.0)

## 2023-01-02 NOTE — Patient Instructions (Signed)
-   continue dosage of warfarin of 1 tablet (5 mg) every day EXCEPT 1/2 tablet on MONDAYS & FRIDAYS - Recheck INR in 6 weeks

## 2023-01-11 ENCOUNTER — Ambulatory Visit
Admission: EM | Admit: 2023-01-11 | Discharge: 2023-01-11 | Disposition: A | Discharge status: Home / Self Care | Payer: Medicare Other

## 2023-01-11 ENCOUNTER — Other Ambulatory Visit: Payer: Self-pay | Admitting: Internal Medicine

## 2023-01-11 DIAGNOSIS — M79672 Pain in left foot: Secondary | ICD-10-CM

## 2023-01-11 MED ORDER — PREDNISONE 20 MG PO TABS: 40.0000 mg | ORAL_TABLET | Freq: Every day | ORAL | 0 refills | Status: AC

## 2023-01-11 NOTE — Discharge Instructions (Addendum)
Follow up with your primary care provider if your symptoms are worsening or not improving.    

## 2023-01-11 NOTE — ED Triage Notes (Signed)
Pt. Presents to UC w/ c/o pain and swelling in his left leg that started 2 days ago. Pt. States he has a hx of lymphedema in this leg as well. Pt. Also states about 4 weeks ago he was treated at a local UC for similar symptoms in his right leg.

## 2023-01-11 NOTE — ED Provider Notes (Signed)
Adam Knight    CSN: [id number] Arrival date & time: 01/11/23  2694      History   Chief Complaint Chief Complaint  Patient presents with   Ankle Pain    HPI Adam Knight is a 82 y.o. male.    Ankle Pain   Patient presents to urgent care with acute pain and swelling in his left leg starting 2 days ago.  No known injury or precipitating event.  Patient endorses history of lymphedema in his L leg. Denies hx of same in R leg though chart indicates bilateral.  Patient recalls treatment about 4 weeks for "gout" in his R foot and states he was treated with antibiotic. He cannot recall the name of the medication and states he begins with a "c". He recalls filled at [facility] who states they filled Cipro on 1/3 prescribed by Laurie Panda at [facility] [address].  Chart indicates CKD with recent GFR = 46.  Past Medical History:  Diagnosis Date   Actinic keratosis    Atypical mole 04/18/2020   R temple Atypical Letiginous melanocytic proliferation with regression, clear post biopsy   Basal cell carcinoma 04/18/2020   Right mid upper forehead, MOHs 06/22/20   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HCC)    Dyslipidemia    Edema    Gastric ulcer    OSA on CPAP    Prediabetes    Pulmonary embolism (HCC)    Seasonal allergies     Patient Active Problem List   Diagnosis Date Noted   Sinus tachycardia 11/21/2022   Bruit of right carotid artery 11/21/2022   Pain in both lower extremities 08/10/2020   Personal history of other malignant neoplasm of skin 06/22/2020   Left leg swelling 02/03/2020   Anticoagulated on Coumadin 08/20/2019   Chronic venous insufficiency 08/20/2019   Aspirin intolerance 06/02/2019   Chronic kidney disease, stage 3a ([place]) 05/19/2019   Physical deconditioning 11/10/2018   Decreased strength, endurance, and mobility 11/10/2018   Decreased activities of daily living (ADL) 10/26/2018   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   Mild cognitive impairment 05/08/2018   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[place]) 05/02/2018   Atypical chest pain 05/02/2018   Palpitations 05/02/2018   Long term (current) use of anticoagulants 02/18/2018   Pulmonary embolus ([place]) 01/06/2018   Primary osteoarthritis of both knees 11/10/2017   Lymphedema of both lower extremities 10/25/2017   Hyperlipidemia, mixed 06/01/2016   Mild depression 06/01/2016   Dyspnea on exertion 03/01/2016   Non morbid obesity due to excess calories 03/01/2016   PVD (peripheral vascular disease) ([place]) 03/01/2016   Primary osteoarthritis of left knee 04/20/2015   Primary osteoarthritis of right knee 04/20/2015   BMI 40.0-44.9, adult ([place]) 04/20/2015   DOE (dyspnea on exertion) 01/26/2015   OSA on CPAP 01/26/2015   Morbid obesity ([place]) 01/26/2015   Chronic edema 01/13/2015   Hyperglycemia, unspecified 01/13/2015   Nephrolithiasis 01/13/2015   Obstructive sleep apnea 05/08/2013    Past Surgical History:  Procedure Laterality Date   ENDARTERECTOMY  10/2018   Pulmonary endarterectomy for CTEPH - [facility]   PULMONARY VENOGRAPHY Bilateral 01/08/2018   Procedure: PULMONARY VENOGRAPHY possible thrombolysis;  Surgeon: Katha Cabal, MD;  Location: [facility];  Service: Cardiovascular;  Laterality: Bilateral;   TONSILLECTOMY         Home Medications    Prior to Admission medications   Medication Sig Start Date End Date Taking? Authorizing Provider  ciprofloxacin (CIPRO)  500 MG tablet SMARTSIG:1 Tablet(s) By Mouth Every 12 Hours 12/26/22  Yes [provider]  Calcium Carbonate Antacid (CALCIUM CARBONATE, DOSED IN MG ELEMENTAL CALCIUM,) 1250 MG/5ML SUSP Take by mouth daily.    [provider]  Cholecalciferol (VITAMIN D) 2000 UNITS tablet Take 2,000 Units by mouth daily.    [provider]  potassium chloride (KLOR-CON) 10 MEQ tablet Take by mouth. [place] MORNING AND 30 MEQ AT  NIGHT. 07/22/19 01/29/23  [provider]  torsemide (DEMADEX) 20 MG tablet Take 2 tablets (40 mg total) by mouth daily. 04/01/19   End, Harrell Gave, MD  warfarin (COUMADIN) 5 MG tablet TAKE 1/2 TO 1 TABLET BY MOUTH DAILY AS DIRECTED BY THE COUMADIN CLINIC. 01/11/23   End, Harrell Gave, MD    Family History Family History  Problem Relation Age of Onset   Stroke Mother    Coronary artery disease Mother    Osteoporosis Mother    Rheum arthritis Mother    Thyroid disease Mother     Social History Social History   Tobacco Use   Smoking status: Former    Packs/day: 0.30    Years: 7.00    Total pack years: 2.10    Types: Cigarettes    Quit date: 47    Years since quitting: 57.0   Smokeless tobacco: Never  Vaping Use   Vaping Use: Never used  Substance Use Topics   Alcohol use: Not Currently   Drug use: No     Allergies   Sulfa antibiotics, Spironolactone, and Penicillins   Review of Systems Review of Systems   Physical Exam Triage Vital Signs ED Triage Vitals  Enc Vitals Group     BP 01/11/23 0954 121/70     Pulse Rate 01/11/23 0954 90     Resp 01/11/23 0954 18     Temp 01/11/23 0954 99.1 F (37.3 C)     Temp Source 01/11/23 0954 Oral     SpO2 01/11/23 0954 94 %     Weight --      Height --      Head Circumference --      Peak Flow --      Pain Score 01/11/23 0953 7     Pain Loc --      Pain Edu? --      Excl. in [place]? --    No data found.  Updated Vital Signs BP 121/70 (BP Location: Left Arm)   Pulse 90   Temp 99.1 F (37.3 C) (Oral)   Resp 18   SpO2 94%   Visual Acuity Right Eye Distance:   Left Eye Distance:   Bilateral Distance:    Right Eye Near:   Left Eye Near:    Bilateral Near:     Physical Exam Vitals reviewed.  Constitutional:      General: He is in acute distress.     Appearance: Normal appearance. He is not ill-appearing.  Cardiovascular:     Rate and Rhythm: Normal rate and regular rhythm.  Skin:    General: Skin is  warm and dry.  Neurological:     General: No focal deficit present.     Mental Status: He is alert and oriented to person, place, and time.  Psychiatric:        Mood and Affect: Mood normal.        Behavior: Behavior normal.      UC Treatments / Results  Labs (all labs ordered are listed, but  only abnormal results are displayed) Labs Reviewed - No data to display  EKG   Radiology No results found.  Procedures Procedures (including critical care time)  Medications Ordered in UC Medications - No data to display  Initial Impression / Assessment and Plan / UC Course  I have reviewed the triage vital signs and the nursing notes.  Pertinent labs & imaging results that were available during my care of the patient were reviewed by me and considered in my medical decision making (see chart for details).   Patient is afebrile here without recent antipyretics. Satting well on room air. Overall is well appearing though in some acute distress related to his L foot/ankle pain. He appears well hydrated and without respiratory distress.  Unclear etiology for his symptoms today, possibly gout. Unable to determine the diagnosis from his recent [facility] visit or why prescribed with antibiotics. Treating with colchicine is contraindicated d/t his reduced GFR. Will prescribe a course of antiinflammatory steroid and ask him to f/up with his PCP.  Final Clinical Impressions(s) / UC Diagnoses   Final diagnoses:  Left foot pain   Discharge Instructions   None    ED Prescriptions   None    PDMP not reviewed this encounter.   Rose Phi, [place] 01/11/23 1035

## 2023-01-11 NOTE — Telephone Encounter (Signed)
Prescription refill request received for warfarin Lov: End, 11/21/2022 Next INR check: 2/21 Warfarin tablet strength: '5mg'$ 

## 2023-01-11 NOTE — Telephone Encounter (Signed)
Refill request

## 2023-02-04 ENCOUNTER — Other Ambulatory Visit: Payer: Self-pay | Admitting: Internal Medicine

## 2023-02-04 NOTE — Telephone Encounter (Signed)
Prescription refill request received for warfarin Lov: 04/26/2022 Next INR check: 2/21 Warfarin tablet strength:  12m

## 2023-02-04 NOTE — Telephone Encounter (Signed)
Refill Request.  

## 2023-02-13 ENCOUNTER — Ambulatory Visit: Payer: Medicare Other | Attending: Internal Medicine | Admitting: Pharmacist

## 2023-02-13 DIAGNOSIS — I2602 Saddle embolus of pulmonary artery with acute cor pulmonale: Secondary | ICD-10-CM

## 2023-02-13 DIAGNOSIS — Z5181 Encounter for therapeutic drug level monitoring: Secondary | ICD-10-CM | POA: Diagnosis present

## 2023-02-13 LAB — POCT INR: INR: 8 — AB (ref 2.0–3.0)

## 2023-02-13 NOTE — Progress Notes (Signed)
Patient did not want to go to lab to verify. Completed course of prednisone on Monday.  Spoke with wife over phone. Emphasized need to feed husband Vitamin K rich foods. Gave ER precautions. Wife voiced understanding.

## 2023-02-13 NOTE — Patient Instructions (Addendum)
Description   -Hold Coumadin today through next Tuesday -Eat green leafy vegetables the rest of the week such as spinach or broccoli - Take 1 tablet next Tuesday - Recheck INR in 1 week

## 2023-02-20 ENCOUNTER — Ambulatory Visit: Payer: Medicare Other | Attending: Internal Medicine

## 2023-02-20 DIAGNOSIS — I2602 Saddle embolus of pulmonary artery with acute cor pulmonale: Secondary | ICD-10-CM

## 2023-02-20 DIAGNOSIS — Z5181 Encounter for therapeutic drug level monitoring: Secondary | ICD-10-CM

## 2023-02-20 LAB — POCT INR: INR: 4.5 — AB (ref 2.0–3.0)

## 2023-02-20 NOTE — Patient Instructions (Signed)
Description   Skip today and tomorrow's dosage of Warfarin, then resume same dosage 1 tablet daily except 1/2 tablet on Mondays and Fridays.  Recheck INR in 2 weeks.

## 2023-02-25 ENCOUNTER — Ambulatory Visit (INDEPENDENT_AMBULATORY_CARE_PROVIDER_SITE_OTHER): Payer: Medicare Other | Admitting: Dermatology

## 2023-02-25 VITALS — BP 130/74 | HR 99

## 2023-02-25 DIAGNOSIS — Z85828 Personal history of other malignant neoplasm of skin: Secondary | ICD-10-CM

## 2023-02-25 DIAGNOSIS — L578 Other skin changes due to chronic exposure to nonionizing radiation: Secondary | ICD-10-CM

## 2023-02-25 DIAGNOSIS — L814 Other melanin hyperpigmentation: Secondary | ICD-10-CM

## 2023-02-25 DIAGNOSIS — L821 Other seborrheic keratosis: Secondary | ICD-10-CM | POA: Diagnosis not present

## 2023-02-25 DIAGNOSIS — D692 Other nonthrombocytopenic purpura: Secondary | ICD-10-CM

## 2023-02-25 DIAGNOSIS — L570 Actinic keratosis: Secondary | ICD-10-CM

## 2023-02-25 DIAGNOSIS — Z86018 Personal history of other benign neoplasm: Secondary | ICD-10-CM

## 2023-02-25 NOTE — Patient Instructions (Addendum)
 Cryotherapy Aftercare  Wash gently with soap and water everyday.   Apply Vaseline and Band-Aid daily until healed. Due to recent changes in healthcare laws, you may see results of your pathology and/or laboratory studies on MyChart before the doctors have had a chance to review them. We understand that in some cases there may be results that are confusing or concerning to you. Please understand that not all results are received at the same time and often the doctors may need to interpret multiple results in order to provide you with the best plan of care or course of treatment. Therefore, we ask that you please give us 2 business days to thoroughly review all your results before contacting the office for clarification. Should we see a critical lab result, you will be contacted sooner.   If You Need Anything After Your Visit  If you have any questions or concerns for your doctor, please call our main line at [phone] and press option 4 to reach your doctor's medical assistant. If no one answers, please leave a voicemail as directed and we will return your call as soon as possible. Messages left after 4 pm will be answered the following business day.   You may also send us a message via MyChart. We typically respond to MyChart messages within 1-2 business days.  For prescription refills, please ask your pharmacy to contact our office. Our fax number is [phone].  If you have an urgent issue when the clinic is closed that cannot wait until the next business day, you can page your doctor at the number below.    Please note that while we do our best to be available for urgent issues outside of office hours, we are not available 24/7.   If you have an urgent issue and are unable to reach us, you may choose to seek medical care at your doctor's office, retail clinic, urgent care center, or emergency room.  If you have a medical emergency, please immediately call 911 or go to the emergency  department.  Pager Numbers  - Dr. Kowalski: [phone]  - Dr. Moye: [phone]  - Dr. Stewart: [phone]  In the event of inclement weather, please call our main line at [phone] for an update on the status of any delays or closures.  Dermatology Medication Tips: Please keep the boxes that topical medications come in in order to help keep track of the instructions about where and how to use these. Pharmacies typically print the medication instructions only on the boxes and not directly on the medication tubes.   If your medication is too expensive, please contact our office at [phone] option 4 or send us a message through MyChart.   We are unable to tell what your co-pay for medications will be in advance as this is different depending on your insurance coverage. However, we may be able to find a substitute medication at lower cost or fill out paperwork to get insurance to cover a needed medication.   If a prior authorization is required to get your medication covered by your insurance company, please allow us 1-2 business days to complete this process.  Drug prices often vary depending on where the prescription is filled and some pharmacies may offer cheaper prices.  The website www.goodrx.com contains coupons for medications through different pharmacies. The prices here do not account for what the cost may be with help from insurance (it may be cheaper with your insurance), but the website can give you the   price if you did not use any insurance.  - You can print the associated coupon and take it with your prescription to the pharmacy.  - You may also stop by our office during regular business hours and pick up a GoodRx coupon card.  - If you need your prescription sent electronically to a different pharmacy, notify our office through [facility] MyChart or by phone at [phone] option 4.     Si Usted Necesita Algo Despus de Su Visita  Tambin puede enviarnos un  mensaje a travs de MyChart. Por lo general respondemos a los mensajes de MyChart en el transcurso de 1 a 2 das hbiles.  Para renovar recetas, por favor pida a su farmacia que se ponga en contacto con nuestra oficina. Nuestro nmero de fax es el [phone].  Si tiene un asunto urgente cuando la clnica est cerrada y que no puede esperar hasta el siguiente da hbil, puede llamar/localizar a su doctor(a) al nmero que aparece a continuacin.   Por favor, tenga en cuenta que aunque hacemos todo lo posible para estar disponibles para asuntos urgentes fuera del horario de oficina, no estamos disponibles las 24 horas del da, los 7 das de la semana.   Si tiene un problema urgente y no puede comunicarse con nosotros, puede optar por buscar atencin mdica  en el consultorio de su doctor(a), en una clnica privada, en un centro de atencin urgente o en una sala de emergencias.  Si tiene una emergencia mdica, por favor llame inmediatamente al 911 o vaya a la sala de emergencias.  Nmeros de bper  - Dr. Kowalski: [phone]  - Dra. Moye: [phone]  - Dra. Stewart: [phone]  En caso de inclemencias del tiempo, por favor llame a nuestra lnea principal al [phone] para una actualizacin sobre el estado de cualquier retraso o cierre.  Consejos para la medicacin en dermatologa: Por favor, guarde las cajas en las que vienen los medicamentos de uso tpico para ayudarle a seguir las instrucciones sobre dnde y cmo usarlos. Las farmacias generalmente imprimen las instrucciones del medicamento slo en las cajas y no directamente en los tubos del medicamento.   Si su medicamento es muy caro, por favor, pngase en contacto con nuestra oficina llamando al [phone] y presione la opcin 4 o envenos un mensaje a travs de MyChart.   No podemos decirle cul ser su copago por los medicamentos por adelantado ya que esto es diferente dependiendo de la cobertura de su seguro. Sin embargo,  es posible que podamos encontrar un medicamento sustituto a menor costo o llenar un formulario para que el seguro cubra el medicamento que se considera necesario.   Si se requiere una autorizacin previa para que su compaa de seguros cubra su medicamento, por favor permtanos de 1 a 2 das hbiles para completar este proceso.  Los precios de los medicamentos varan con frecuencia dependiendo del lugar de dnde se surte la receta y alguna farmacias pueden ofrecer precios ms baratos.  El sitio web www.goodrx.com tiene cupones para medicamentos de diferentes farmacias. Los precios aqu no tienen en cuenta lo que podra costar con la ayuda del seguro (puede ser ms barato con su seguro), pero el sitio web puede darle el precio si no utiliz ningn seguro.  - Puede imprimir el cupn correspondiente y llevarlo con su receta a la farmacia.  - Tambin puede pasar por nuestra oficina durante el horario de atencin regular y recoger una tarjeta de cupones de GoodRx.  - Si necesita que   su receta se enve electrnicamente a una farmacia diferente, informe a nuestra oficina a travs de MyChart de [place] o por telfono llamando al [phone] y presione la opcin 4.  

## 2023-02-25 NOTE — Progress Notes (Signed)
Follow-Up Visit   Subjective  Adam Knight is a 82 y.o. male who presents for the following: Follow-up.  Patient presents for 6 month follow-up Aks. He hasn't noticed any new or bothersome spots. History of BCC of the right mid upper forehead.  The following portions of the chart were reviewed this encounter and updated as appropriate:       Review of Systems:  No other skin or systemic complaints except as noted in HPI or Assessment and Plan.  Objective  Well appearing patient in no apparent distress; mood and affect are within normal limits.  A focused examination was performed including face, scalp, arms. Relevant physical exam findings are noted in the Assessment and Plan.  upper forehead x 3, right mid forearm x 1, left forearm x 2 (6) Pink scaly macules.    Assessment & Plan  Actinic Damage - chronic, secondary to cumulative UV radiation exposure/sun exposure over time - diffuse scaly erythematous macules with underlying dyspigmentation - Recommend daily broad spectrum sunscreen SPF 30+ to sun-exposed areas, reapply every 2 hours as needed.  - Recommend staying in the shade or wearing long sleeves, sun glasses (UVA+UVB protection) and wide brim hats (4-inch brim around the entire circumference of the hat). - Call for new or changing lesions.  History of Basal Cell Carcinoma of the Skin - No evidence of recurrence today of the right mid upper forehead, Mohs 06/22/2020. - Recommend regular full body skin exams - Recommend daily broad spectrum sunscreen SPF 30+ to sun-exposed areas, reapply every 2 hours as needed.  - Call if any new or changing lesions are noted between office visits  History of Atypical Lentiginous Melanocytic Proliferation with Regression - No evidence of recurrence today of the right temple, clear with biopsy, 04/18/2020 - Recommend regular full body skin exams - Recommend daily broad spectrum sunscreen SPF 30+ to sun-exposed areas, reapply every  2 hours as needed.  - Call if any new or changing lesions are noted between office visits  Purpura - Chronic; persistent and recurrent.  Treatable, but not curable. - Violaceous macules and patches - Benign - Related to trauma, age, sun damage and/or use of blood thinners, chronic use of topical and/or oral steroids - Observe - Can use OTC arnica containing moisturizer such as Dermend Bruise Formula if desired - Call for worsening or other concerns  AK (actinic keratosis) (6) upper forehead x 3, right mid forearm x 1, left forearm x 2  Actinic keratoses are precancerous spots that appear secondary to cumulative UV radiation exposure/sun exposure over time. They are chronic with expected duration over 1 year. A portion of actinic keratoses will progress to squamous cell carcinoma of the skin. It is not possible to reliably predict which spots will progress to skin cancer and so treatment is recommended to prevent development of skin cancer.  Recommend daily broad spectrum sunscreen SPF 30+ to sun-exposed areas, reapply every 2 hours as needed.  Recommend staying in the shade or wearing long sleeves, sun glasses (UVA+UVB protection) and wide brim hats (4-inch brim around the entire circumference of the hat). Call for new or changing lesions.  Destruction of lesion - upper forehead x 3, right mid forearm x 1, left forearm x 2  Destruction method: cryotherapy   Informed consent: discussed and consent obtained   Lesion destroyed using liquid nitrogen: Yes   Region frozen until ice ball extended beyond lesion: Yes   Outcome: patient tolerated procedure well with no complications   Post-procedure  details: wound care instructions given   Additional details:  Prior to procedure, discussed risks of blister formation, small wound, skin dyspigmentation, or rare scar following cryotherapy. Recommend Vaseline ointment to treated areas while healing.   Seborrheic Keratoses - Stuck-on, waxy,  tan-brown papules and/or plaques  - Benign-appearing - Discussed benign etiology and prognosis. - Observe - Call for any changes  Lentigines - Scattered tan macules - Due to sun exposure - Benign-appearing, observe - Recommend daily broad spectrum sunscreen SPF 30+ to sun-exposed areas, reapply every 2 hours as needed. - Call for any changes  Return in about 6 months (around 08/28/2023) for UBSE, Hx AKs, Hx BCC.  IJamesetta Orleans, CMA, am acting as scribe for Brendolyn Patty, MD .  Documentation: I have reviewed the above documentation for accuracy and completeness, and I agree with the above.  Brendolyn Patty MD

## 2023-03-06 ENCOUNTER — Ambulatory Visit: Payer: Medicare Other | Attending: Internal Medicine

## 2023-03-06 DIAGNOSIS — Z5181 Encounter for therapeutic drug level monitoring: Secondary | ICD-10-CM | POA: Diagnosis not present

## 2023-03-06 DIAGNOSIS — I2602 Saddle embolus of pulmonary artery with acute cor pulmonale: Secondary | ICD-10-CM | POA: Diagnosis not present

## 2023-03-06 LAB — POCT INR: INR: 4.5 — AB (ref 2.0–3.0)

## 2023-03-06 NOTE — Patient Instructions (Signed)
Description   Skip today and tomorrow's dosage of Warfarin, then start taking 1 tablet daily except 1/2 tablet on Mondays, Wednesdays and Fridays.  Recheck INR in 2 weeks.

## 2023-03-13 ENCOUNTER — Telehealth: Payer: Self-pay | Admitting: Orthopaedic Surgery

## 2023-03-13 ENCOUNTER — Encounter: Payer: Self-pay | Admitting: Internal Medicine

## 2023-03-13 NOTE — Telephone Encounter (Signed)
error 

## 2023-03-13 NOTE — Telephone Encounter (Signed)
Patient stating he need Dr. Ninfa Linden to call Dr. Saunders Revel, at [phone], patient states that his PCP needs to speak to Dr. Ninfa Linden, and he was told to tell Dr. Ninfa Linden. Please advise.

## 2023-03-20 ENCOUNTER — Ambulatory Visit
Payer: Medicare Other | Attending: Internal Medicine | Admitting: Pharmacist Clinician (PhC)/ Clinical Pharmacy Specialist

## 2023-03-20 DIAGNOSIS — I2602 Saddle embolus of pulmonary artery with acute cor pulmonale: Secondary | ICD-10-CM | POA: Diagnosis not present

## 2023-03-20 DIAGNOSIS — Z5181 Encounter for therapeutic drug level monitoring: Secondary | ICD-10-CM | POA: Insufficient documentation

## 2023-03-20 LAB — POCT INR: INR: 4 — AB (ref 2.0–3.0)

## 2023-03-20 NOTE — Patient Instructions (Signed)
Skip warfarin today (Wednesday March 27), then start taking 1/2 tablet daily except 1 tablet each Sunday, Tuesday and Thursday.   Recheck INR in 2 weeks.

## 2023-04-03 ENCOUNTER — Ambulatory Visit: Payer: Medicare Other | Attending: Internal Medicine

## 2023-04-03 DIAGNOSIS — I2602 Saddle embolus of pulmonary artery with acute cor pulmonale: Secondary | ICD-10-CM | POA: Diagnosis present

## 2023-04-03 DIAGNOSIS — Z5181 Encounter for therapeutic drug level monitoring: Secondary | ICD-10-CM | POA: Insufficient documentation

## 2023-04-03 LAB — POCT INR: INR: 1.8 — AB (ref 2.0–3.0)

## 2023-04-03 NOTE — Patient Instructions (Signed)
Description   Take 1.5 tablets today, then start taking 1 tablet daily except 1/2 tablet on Mondays, Wednesdays and Fridays.   Recheck INR in 3 weeks.

## 2023-04-24 ENCOUNTER — Ambulatory Visit: Payer: Medicare Other | Attending: Internal Medicine | Admitting: *Deleted

## 2023-04-24 ENCOUNTER — Telehealth (HOSPITAL_COMMUNITY): Payer: Self-pay | Admitting: *Deleted

## 2023-04-24 DIAGNOSIS — Z5181 Encounter for therapeutic drug level monitoring: Secondary | ICD-10-CM

## 2023-04-24 DIAGNOSIS — I2602 Saddle embolus of pulmonary artery with acute cor pulmonale: Secondary | ICD-10-CM

## 2023-04-24 LAB — POCT INR: INR: 2.8 (ref 2.0–3.0)

## 2023-04-24 NOTE — Patient Instructions (Signed)
Continue warfarin 1 tablet daily except 1/2 tablet on Mondays, Wednesdays and Fridays.   Recheck INR in 4 weeks.

## 2023-04-24 NOTE — Telephone Encounter (Signed)
Received pulmonary rehab referral from Dr. Ala Dach and [facility].  Noted that pt resides in [place].  Called and left message for pt to please contact for preference of location.  [facility] verses [facility]. Alanson Aly, BSN Cardiac and Emergency planning/management officer

## 2023-05-02 ENCOUNTER — Telehealth (HOSPITAL_COMMUNITY): Payer: Self-pay

## 2023-05-02 NOTE — Telephone Encounter (Signed)
Outside/paper referral received by Dr. Ala Dach from [facility].

## 2023-05-02 NOTE — Telephone Encounter (Signed)
Pt would rather attend [facility] for pulmonary rehab. Pt referral is in their workque.

## 2023-05-10 ENCOUNTER — Encounter: Payer: Self-pay | Admitting: Internal Medicine

## 2023-05-10 ENCOUNTER — Ambulatory Visit: Payer: Medicare Other | Attending: Internal Medicine | Admitting: Internal Medicine

## 2023-05-10 VITALS — BP 120/70 | HR 73 | Ht 62.0 in | Wt 216.0 lb

## 2023-05-10 DIAGNOSIS — M25562 Pain in left knee: Secondary | ICD-10-CM | POA: Diagnosis present

## 2023-05-10 DIAGNOSIS — G8929 Other chronic pain: Secondary | ICD-10-CM | POA: Diagnosis present

## 2023-05-10 DIAGNOSIS — I2724 Chronic thromboembolic pulmonary hypertension: Secondary | ICD-10-CM

## 2023-05-10 DIAGNOSIS — R0989 Other specified symptoms and signs involving the circulatory and respiratory systems: Secondary | ICD-10-CM | POA: Diagnosis not present

## 2023-05-10 DIAGNOSIS — Z01810 Encounter for preprocedural cardiovascular examination: Secondary | ICD-10-CM

## 2023-05-10 DIAGNOSIS — R0609 Other forms of dyspnea: Secondary | ICD-10-CM | POA: Diagnosis not present

## 2023-05-10 DIAGNOSIS — N1831 Chronic kidney disease, stage 3a: Secondary | ICD-10-CM | POA: Diagnosis not present

## 2023-05-10 NOTE — Progress Notes (Signed)
Follow-up Outpatient Visit Date: 05/10/2023  Primary Care Provider: Orene Desanctis, MD [address] [facility] [place] [address]  Chief Complaint: Follow-up shortness of breath and chronic thromboembolic pulmonary hypertension  HPI:  Adam Knight is a 82 y.o. 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  I last saw him in late November, which time he felt the same as at prior visits with stable exertional dyspnea with moderate activity.  We agreed to perform right heart catheterization for further evaluation, though he subsequently canceled the procedure.  He was unable to keep our follow-up visit in January due to a UTI and gout flare.  Today, Adam Knight reports that he feels about the same as at prior visits with stable exertional dyspnea.  He also complains of sporadic left lower quadrant pain.  He denies chest pain and palpitations.  He is bothered by bilateral knee pain and anticipates needing to undergo left knee replacement in the future by Dr. Magnus Ivan.  He has not had any bleeding, though his INR's were labile after treatment of his UTI in the winter.  Last INR was therapeutic again at 2.8.  --------------------------------------------------------------------------------------------------  Past Medical History:  Diagnosis Date   Actinic keratosis    Atypical mole 04/18/2020   R temple Atypical Letiginous melanocytic proliferation with regression, clear post biopsy   Basal cell carcinoma 04/18/2020   Right mid upper forehead, MOHs 06/22/20   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HCC)    Dyslipidemia    Edema    Gastric ulcer    OSA on CPAP    Prediabetes    Pulmonary embolism (HCC)    Seasonal allergies    Past Surgical History:  Procedure Laterality Date    ENDARTERECTOMY  10/2018   Pulmonary endarterectomy for CTEPH - [facility]   PULMONARY VENOGRAPHY Bilateral 01/08/2018   Procedure: PULMONARY VENOGRAPHY possible thrombolysis;  Surgeon: Renford Dills, MD;  Location: [facility] INVASIVE CV LAB;  Service: Cardiovascular;  Laterality: Bilateral;   TONSILLECTOMY      Current Meds  Medication Sig   Calcium Carbonate Antacid (CALCIUM CARBONATE, DOSED IN MG ELEMENTAL CALCIUM,) 1250 MG/5ML SUSP Take by mouth daily.   Cholecalciferol (VITAMIN D) 2000 UNITS tablet Take 2,000 Units by mouth daily.   ciprofloxacin (CIPRO) 500 MG tablet SMARTSIG:1 Tablet(s) By Mouth Every 12 Hours   potassium chloride (KLOR-CON) 10 MEQ tablet Take by mouth. 40 MEQ IN THE MORNING AND 30 MEQ AT NIGHT.   torsemide (DEMADEX) 20 MG tablet Take 2 tablets (40 mg total) by mouth daily.   warfarin (COUMADIN) 5 MG tablet TAKE 1/2 TO 1 TABLET BY MOUTH DAILY AS DIRECTED BY THE COUMADIN CLINIC.    Allergies: Sulfa antibiotics, Spironolactone, and Penicillins  Social History   Tobacco Use   Smoking status: Former    Packs/day: 0.30    Years: 7.00    Additional pack years: 0.00    Total pack years: 2.10    Types: Cigarettes    Quit date: 52    Years since quitting: 57.4   Smokeless tobacco: Never  Vaping Use   Vaping Use: Never used  Substance Use Topics   Alcohol use: Not Currently   Drug use: No    Family History  Problem Relation Age of Onset   Stroke Mother    Coronary artery disease Mother  Osteoporosis Mother    Rheum arthritis Mother    Thyroid disease Mother     Review of Systems: A 12-system review of systems was performed and was negative except as noted in the HPI.  --------------------------------------------------------------------------------------------------  Physical Exam: BP 120/70 (BP Location: Left Arm, Patient Position: Sitting, Cuff Size: Normal)   Pulse 73   Ht 5\' 2"  (1.575 m)   Wt 216 lb (98 kg)   SpO2 95%   BMI 39.51 kg/m    General:  NAD. Neck: No JVD or HJR. Lungs: Clear to auscultation bilaterally without wheezes or crackles. Heart: Regular rate and rhythm without murmurs, rubs, or gallops. Abdomen: Soft, nontender, nondistended. Extremities: Trace pretibial edema.  Left leg compression stocking in place.  EKG: Normal sinus rhythm with left axis deviation and right bundle branch block.  Heart rate has decreased since 11/21/2022.  Otherwise, no significant interval change.  Lab Results  Component Value Date   WBC 9.6 06/14/2022   HGB 14.7 06/14/2022   HCT 44.2 06/14/2022   MCV 85.7 06/14/2022   PLT 410 (H) 06/14/2022    Lab Results  Component Value Date   NA 135 06/14/2022   K 3.5 06/14/2022   CL 98 06/14/2022   CO2 26 06/14/2022   BUN 26 (H) 06/14/2022   CREATININE 1.44 (H) 06/14/2022   GLUCOSE 140 (H) 06/14/2022   ALT 16 06/14/2022    --------------------------------------------------------------------------------------------------  ASSESSMENT AND PLAN: Dyspnea on exertion and chronic thromboembolic pulmonary hypertension: Chronic exertional dyspnea is stable.  Adam Knight was seen by Dr. Ala Dach at [facility] last month with recommendations made to repeat echocardiogram and CT of the chest.  CT has been completed and did not show any evidence of interstitial lung disease.  We discussed repeating an echo versus proceeding with the right heart catheterization that we discussed at our prior visit.  Adam Knight wishes to move forward with the echocardiogram first, which we will schedule at his earliest convenience.  Continue long-term anticoagulation with warfarin.  Carotid bruit: Noted on exam at our last visit.  Subsequent carotid Doppler without evidence of significant disease in either extracranial carotid artery.  No further workup recommended at this time.  Chronic kidney disease stage III: Avoid nephrotoxic agents and continue follow-up with PCP.  Left knee pain and preoperative cardiovascular  risk assessment: Adam Knight received left knee steroid steroid injection earlier this week but anticipates needing left total knee replacement in the near future.  He does not have any unstable symptoms.  However, given his history of severe pulmonary hypertension in the past in the setting of chronic thromboembolic pulmonary hypertension, I think it would be appropriate to reassess his left and right heart function as well as attempt to measure his PA pressures.  We will therefore proceed with echocardiogram and provide further recommendations regarding perioperative risk at that time.  He will need enoxaparin bridging for any elective surgical procedures.  Follow-up: Return to clinic in 3-4 months.  Yvonne Kendall, MD 05/10/2023 9:24 AM

## 2023-05-10 NOTE — Patient Instructions (Signed)
Medication Instructions:  Your Physician recommend you continue on your current medication as directed.    *If you need a refill on your cardiac medications before your next appointment, please call your pharmacy*   Lab Work: None ordered today   Testing/Procedures: Your physician has requested that you have an echocardiogram. Echocardiography is a painless test that uses sound waves to create images of your heart. It provides your doctor with information about the size and shape of your heart and how well your heart's chambers and valves are working.   You may receive an ultrasound enhancing agent through an IV if needed to better visualize your heart during the echo. This procedure takes approximately one hour.  There are no restrictions for this procedure.  This will take place at [address], [place] [address]    Follow-Up: At [facility], you and your health needs are our priority.  As part of our continuing mission to provide you with exceptional heart care, we have created designated Provider Care Teams.  These Care Teams include your primary Cardiologist (physician) and Advanced Practice Providers (APPs -  Physician Assistants and Nurse Practitioners) who all work together to provide you with the care you need, when you need it.  We recommend signing up for the patient portal called "MyChart".  Sign up information is provided on this After Visit Summary.  MyChart is used to connect with patients for Virtual Visits (Telemedicine).  Patients are able to view lab/test results, encounter notes, upcoming appointments, etc.  Non-urgent messages can be sent to your provider as well.   To learn more about what you can do with MyChart, go to [email or web address].    Your next appointment:   3-4 month(s)  Provider:   You may see Yvonne Kendall, MD or one of the following Advanced Practice Providers on your designated Care Team:    Nicolasa Ducking, NP Eula Listen, PA-C Cadence Fransico Michael, PA-C Charlsie Quest, NP

## 2023-05-12 ENCOUNTER — Other Ambulatory Visit: Payer: Self-pay | Admitting: Internal Medicine

## 2023-05-13 NOTE — Telephone Encounter (Signed)
Prescription refill request received for warfarin Lov: End, 05/10/2023 Next INR check: 5/29 Warfarin tablet strength: 5mg 

## 2023-05-13 NOTE — Telephone Encounter (Signed)
Refill request

## 2023-05-14 ENCOUNTER — Ambulatory Visit: Payer: Medicare Other | Attending: Internal Medicine

## 2023-05-14 DIAGNOSIS — I2724 Chronic thromboembolic pulmonary hypertension: Secondary | ICD-10-CM | POA: Diagnosis present

## 2023-05-15 LAB — ECHOCARDIOGRAM COMPLETE
AR max vel: 3.62 cm2
AV Area VTI: 3.64 cm2
AV Area mean vel: 3.51 cm2
AV Mean grad: 2 mmHg
AV Peak grad: 4.2 mmHg
Ao pk vel: 1.03 m/s
Area-P 1/2: 3.65 cm2
Calc EF: 44.3 %
Single Plane A2C EF: 47.3 %
Single Plane A4C EF: 43.8 %

## 2023-05-17 ENCOUNTER — Telehealth: Payer: Self-pay

## 2023-05-17 NOTE — Telephone Encounter (Signed)
Patient left voice mail for return call.  I called patient and left voice mail for return call.

## 2023-05-22 ENCOUNTER — Telehealth: Payer: Self-pay | Admitting: Internal Medicine

## 2023-05-22 ENCOUNTER — Ambulatory Visit: Payer: Medicare Other | Attending: Internal Medicine

## 2023-05-22 DIAGNOSIS — I2602 Saddle embolus of pulmonary artery with acute cor pulmonale: Secondary | ICD-10-CM | POA: Diagnosis present

## 2023-05-22 DIAGNOSIS — Z5181 Encounter for therapeutic drug level monitoring: Secondary | ICD-10-CM

## 2023-05-22 LAB — POCT INR: INR: 2.7 (ref 2.0–3.0)

## 2023-05-22 NOTE — Telephone Encounter (Signed)
Please see results note.

## 2023-05-22 NOTE — Patient Instructions (Signed)
Continue warfarin 1 tablet daily except 1/2 tablet on Mondays, Wednesdays and Fridays.   Recheck INR in 8 weeks.

## 2023-05-22 NOTE — Telephone Encounter (Signed)
Patient is returning call in regards to echo results. Requesting return call.   

## 2023-07-17 ENCOUNTER — Ambulatory Visit: Payer: Medicare Other | Attending: Internal Medicine

## 2023-07-17 DIAGNOSIS — Z5181 Encounter for therapeutic drug level monitoring: Secondary | ICD-10-CM | POA: Insufficient documentation

## 2023-07-17 DIAGNOSIS — I2602 Saddle embolus of pulmonary artery with acute cor pulmonale: Secondary | ICD-10-CM | POA: Diagnosis present

## 2023-07-17 LAB — POCT INR: INR: 2.6 (ref 2.0–3.0)

## 2023-07-17 NOTE — Patient Instructions (Signed)
Continue warfarin 1 tablet daily except 1/2 tablet on Mondays, Wednesdays and Fridays.   Recheck INR in 8 weeks.

## 2023-08-20 NOTE — Progress Notes (Unsigned)
Cardiology Office Note:  .   Date:  08/21/2023  ID:  Adam Knight, DOB 1941-10-02, MRN [id number] PCP: Adam Desanctis, MD  [facility] Providers Cardiologist:  Adam Kendall, MD     History of Present Illness: .   Adam Knight is a 82 y.o. 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  I last saw him in May, at which time he reported stable dyspnea on exertion.  He also noted sporadic left lower quadrant pain as well as bilateral knee pain.  He noted that he would likely need to undergo left knee replacement in the future by Dr. Magnus Knight.  We agreed to obtain an echocardiogram, as previously recommended by Dr. Ala Knight at [facility], which showed normal LVEF with moderately reduced RV function as well as flattening of the interventricular septum in systole suggestive of of RV pressure overload.  PA pressure could not be estimated.  Today, [name] reports that he feels about the same as her prior visits with chronic exertional dyspnea when walking extended distances.  He sometimes has to stop to catch his breath when dragging branches on his property.  He is also limited by continued knee pain and inquires about the safety of undergoing knee surgery.  He denies chest pain, palpitations, lightheadedness, and syncope.  Chronic swelling in the left leg is well-controlled with regular compression stocking use.  He has not had any significant bleeding, with his most recent INR is being therapeutic.  He notes that some forms may have been forwarded to our office regarding his CPAP, though I do not see anything in our system about this.  ROS: See HPI  Studies Reviewed: Marland Kitchen        TTE (05/14/2023): Normal LV size and wall thickness.  LVEF 55-60% without wall motion abnormality.  Interventricular  septum is flattened in systole consistent with right ventricular pressure overload.  Grade 1 diastolic dysfunction present.  RV is normal in size with moderately reduced contraction.  Unable to assess PA pressure.  Normal biatrial size.  No pericardial effusion.  Normal mitral and tricuspid valves.  Mild aortic regurgitation present.  CVP 8 mmHg.  Risk Assessment/Calculations:             Physical Exam:   VS:  BP 120/76 (BP Location: Left Arm, Patient Position: Sitting, Cuff Size: Normal)   Pulse 87   Ht 5\' 2"  (1.575 m)   Wt 206 lb 12.8 oz (93.8 kg)   SpO2 97%   BMI 37.82 kg/m    Wt Readings from Last 3 Encounters:  08/21/23 206 lb 12.8 oz (93.8 kg)  05/10/23 216 lb (98 kg)  12/26/22 227 lb (103 kg)    General:  NAD. Neck: No JVD or HJR. Lungs: Clear to auscultation bilaterally without wheezes or crackles. Heart: Regular rate and rhythm without murmurs, rubs, or gallops. Abdomen: Soft, nontender, nondistended. Extremities: Trace pretibial edema on the right.  Left lower extremity with compression stocking in place.  ASSESSMENT AND PLAN: .    Dyspnea on exertion and chronic thromboembolic pulmonary hypertension: Overall, chronic DOE is unchanged.  Recent echo was unable to an estimate RVSP but was notable for moderately reduced RV systolic function and findings suggestive of RV pressure overload.  I have again recommended that we proceed with right heart catheterization.  In particular, it will be  important to understand [name] PA pressures before undergoing deep sedation or general anesthesia, as severe pulmonary hypertension carries significant risk for cardiovascular collapse.  We discussed right heart catheterization, including the rationale, risks, and benefits again today; [name] remains reluctant to move forward.  I have encouraged him to think about it and to let us know if he wishes to proceed.  I do not think we would need to interrupt his anticoagulation for RHC.   He should continue follow-up with Dr. Ala Knight at the [facility] pulmonary hypertension clinic as well.  Continue long-term anticoagulation with warfarin, managed per our [facility] clinic.  I asked [name] to reach out to Dr. Marily Knight office for further guidance about his CPAP questions.  Chronic kidney disease stage IIIb: [name] appears euvolemic.  Labs in June through Dr. Othella Knight office showed stable renal function and electrolytes.  Continue current doses of torsemide and potassium.  Knee pain: Ongoing issue.  As outlined above, I am unable to provide complete preoperative cardiovascular risk assessment without better understanding of [name]'s pulmonary artery pressures.  I have to assume based on his echo findings that he has moderate to severe pulmonary hypertension, placing him at high risk for perioperative cardiovascular complications.  I would favor continued conservative management of his knee pain in an effort to avoid anesthesia.    Dispo: Return to clinic in 6 months.  Signed, Adam Kendall, MD

## 2023-08-21 ENCOUNTER — Ambulatory Visit: Payer: Medicare Other | Attending: Internal Medicine | Admitting: Internal Medicine

## 2023-08-21 ENCOUNTER — Encounter: Payer: Self-pay | Admitting: Internal Medicine

## 2023-08-21 VITALS — BP 120/76 | HR 87 | Ht 62.0 in | Wt 206.8 lb

## 2023-08-21 DIAGNOSIS — N1832 Chronic kidney disease, stage 3b: Secondary | ICD-10-CM

## 2023-08-21 DIAGNOSIS — I2724 Chronic thromboembolic pulmonary hypertension: Secondary | ICD-10-CM | POA: Diagnosis present

## 2023-08-21 DIAGNOSIS — M25562 Pain in left knee: Secondary | ICD-10-CM

## 2023-08-21 DIAGNOSIS — R0609 Other forms of dyspnea: Secondary | ICD-10-CM | POA: Diagnosis present

## 2023-08-21 DIAGNOSIS — G8929 Other chronic pain: Secondary | ICD-10-CM | POA: Diagnosis present

## 2023-08-21 NOTE — Patient Instructions (Signed)

## 2023-09-03 ENCOUNTER — Ambulatory Visit (INDEPENDENT_AMBULATORY_CARE_PROVIDER_SITE_OTHER): Payer: Medicare Other | Admitting: Dermatology

## 2023-09-03 VITALS — BP 142/79 | HR 76

## 2023-09-03 DIAGNOSIS — D692 Other nonthrombocytopenic purpura: Secondary | ICD-10-CM

## 2023-09-03 DIAGNOSIS — L814 Other melanin hyperpigmentation: Secondary | ICD-10-CM | POA: Diagnosis not present

## 2023-09-03 DIAGNOSIS — L821 Other seborrheic keratosis: Secondary | ICD-10-CM

## 2023-09-03 DIAGNOSIS — L570 Actinic keratosis: Secondary | ICD-10-CM

## 2023-09-03 DIAGNOSIS — Z1283 Encounter for screening for malignant neoplasm of skin: Secondary | ICD-10-CM | POA: Diagnosis not present

## 2023-09-03 DIAGNOSIS — L578 Other skin changes due to chronic exposure to nonionizing radiation: Secondary | ICD-10-CM | POA: Diagnosis not present

## 2023-09-03 DIAGNOSIS — D1801 Hemangioma of skin and subcutaneous tissue: Secondary | ICD-10-CM

## 2023-09-03 DIAGNOSIS — Z85828 Personal history of other malignant neoplasm of skin: Secondary | ICD-10-CM

## 2023-09-03 DIAGNOSIS — W908XXA Exposure to other nonionizing radiation, initial encounter: Secondary | ICD-10-CM | POA: Diagnosis not present

## 2023-09-03 DIAGNOSIS — D229 Melanocytic nevi, unspecified: Secondary | ICD-10-CM

## 2023-09-03 DIAGNOSIS — L820 Inflamed seborrheic keratosis: Secondary | ICD-10-CM | POA: Diagnosis not present

## 2023-09-03 DIAGNOSIS — Z872 Personal history of diseases of the skin and subcutaneous tissue: Secondary | ICD-10-CM

## 2023-09-03 DIAGNOSIS — Z86018 Personal history of other benign neoplasm: Secondary | ICD-10-CM

## 2023-09-03 NOTE — Progress Notes (Signed)
Follow-Up Visit   Subjective  Adam Knight is a 82 y.o. male who presents for the following:  6 months  Skin Cancer Screening and Upper Body Skin Exam, hx of BCC,  hx of precancers. He has new scaly spot under his left eye that is bothersome.  The patient presents for Upper Body Skin Exam (UBSE) for skin cancer screening and mole check. The patient has spots, moles and lesions to be evaluated, some may be new or changing and the patient may have concern these could be cancer.    The following portions of the chart were reviewed this encounter and updated as appropriate: medications, allergies, medical history  Review of Systems:  No other skin or systemic complaints except as noted in HPI or Assessment and Plan.  Objective  Well appearing patient in no apparent distress; mood and affect are within normal limits.  All skin waist up examined. Relevant physical exam findings are noted in the Assessment and Plan.  left upper forehead x 4,mid forehead x 2, right temple x 2, right mid cheek x 1, left lateral canthus x 1 (10) Erythematous thin papules/macules with gritty scale.   left infraocular x 1 Stuck-on, waxy, tan-brown papule with erythema and crusting --Discussed benign etiology and prognosis.     Assessment & Plan   AK (actinic keratosis) (10) left upper forehead x 4,mid forehead x 2, right temple x 2, right mid cheek x 1, left lateral canthus x 1  Actinic keratoses are precancerous spots that appear secondary to cumulative UV radiation exposure/sun exposure over time. They are chronic with expected duration over 1 year. A portion of actinic keratoses will progress to squamous cell carcinoma of the skin. It is not possible to reliably predict which spots will progress to skin cancer and so treatment is recommended to prevent development of skin cancer.  Recommend daily broad spectrum sunscreen SPF 30+ to sun-exposed areas, reapply every 2 hours as needed.  Recommend  staying in the shade or wearing long sleeves, sun glasses (UVA+UVB protection) and wide brim hats (4-inch brim around the entire circumference of the hat). Call for new or changing lesions.   Destruction of lesion - left upper forehead x 4,mid forehead x 2, right temple x 2, right mid cheek x 1, left lateral canthus x 1 (10)  Destruction method: cryotherapy   Informed consent: discussed and consent obtained   Lesion destroyed using liquid nitrogen: Yes   Region frozen until ice ball extended beyond lesion: Yes   Outcome: patient tolerated procedure well with no complications   Post-procedure details: wound care instructions given   Additional details:  Prior to procedure, discussed risks of blister formation, small wound, skin dyspigmentation, or rare scar following cryotherapy. Recommend Vaseline ointment to treated areas while healing.   Inflamed seborrheic keratosis left infraocular x 1  Symptomatic, irritating, patient would like treated.   Destruction of lesion - left infraocular x 1  Destruction method: cryotherapy   Informed consent: discussed and consent obtained   Lesion destroyed using liquid nitrogen: Yes   Region frozen until ice ball extended beyond lesion: Yes   Outcome: patient tolerated procedure well with no complications   Post-procedure details: wound care instructions given   Additional details:  Prior to procedure, discussed risks of blister formation, small wound, skin dyspigmentation, or rare scar following cryotherapy. Recommend Vaseline ointment to treated areas while healing.    Skin cancer screening performed today.  Actinic Damage - Chronic condition, secondary to cumulative UV/sun exposure -  diffuse scaly erythematous macules with underlying dyspigmentation - Recommend daily broad spectrum sunscreen SPF 30+ to sun-exposed areas, reapply every 2 hours as needed.  - Staying in the shade or wearing long sleeves, sun glasses (UVA+UVB protection) and wide  brim hats (4-inch brim around the entire circumference of the hat) are also recommended for sun protection.  - Call for new or changing lesions.  Lentigines, Seborrheic Keratoses, Hemangiomas - Benign normal skin lesions - Benign-appearing - Call for any changes  Melanocytic Nevi - Tan-brown and/or pink-flesh-colored symmetric macules and papules - Benign appearing on exam today - Observation - Call clinic for new or changing moles - Recommend daily use of broad spectrum spf 30+ sunscreen to sun-exposed areas.   Purpura - Chronic; persistent and recurrent.  Treatable, but not curable Arms  - Violaceous macules and patches - Benign - Related to trauma, age, sun damage and/or use of blood thinners, chronic use of topical and/or oral steroids - Observe - Can use OTC arnica containing moisturizer such as Dermend Bruise Formula if desired - Call for worsening or other concerns    History of Atypical Lentiginous Melanocytic Proliferation with Regression - No evidence of recurrence today of the right temple, clear with biopsy, 04/18/2020 - Recommend regular full body skin exams - Recommend daily broad spectrum sunscreen SPF 30+ to sun-exposed areas, reapply every 2 hours as needed.  - Call if any new or changing lesions are noted between office visits   HISTORY OF BASAL CELL CARCINOMA OF THE SKIN Right mid upper forehead 06/22/20 Mohs - No evidence of recurrence today - Recommend regular full body skin exams - Recommend daily broad spectrum sunscreen SPF 30+ to sun-exposed areas, reapply every 2 hours as needed.  - Call if any new or changing lesions are noted between office visits   Return in about 6 months (around 03/02/2024) for Aks f/up.  I, Angelique Holm, CMA, am acting as scribe for Adam Niece, MD .   Documentation: I have reviewed the above documentation for accuracy and completeness, and I agree with the above.  Adam Niece, MD

## 2023-09-03 NOTE — Patient Instructions (Addendum)

## 2023-09-11 ENCOUNTER — Ambulatory Visit: Payer: Medicare Other | Attending: Internal Medicine

## 2023-09-11 DIAGNOSIS — I2602 Saddle embolus of pulmonary artery with acute cor pulmonale: Secondary | ICD-10-CM

## 2023-09-11 DIAGNOSIS — Z5181 Encounter for therapeutic drug level monitoring: Secondary | ICD-10-CM | POA: Diagnosis present

## 2023-09-11 LAB — POCT INR: INR: 2.5 (ref 2.0–3.0)

## 2023-09-11 NOTE — Patient Instructions (Signed)
Continue warfarin 1 tablet daily except 1/2 tablet on Mondays, Wednesdays and Fridays.   Recheck INR in 8 weeks.

## 2023-11-06 ENCOUNTER — Ambulatory Visit: Payer: Medicare Other | Attending: Internal Medicine

## 2023-11-06 DIAGNOSIS — Z5181 Encounter for therapeutic drug level monitoring: Secondary | ICD-10-CM

## 2023-11-06 DIAGNOSIS — I2602 Saddle embolus of pulmonary artery with acute cor pulmonale: Secondary | ICD-10-CM | POA: Diagnosis not present

## 2023-11-06 LAB — POCT INR: INR: 4.5 — AB (ref 2.0–3.0)

## 2023-11-06 NOTE — Patient Instructions (Signed)
HOLD TODAY and THURSDAY THEN Continue warfarin 1 tablet daily except 1/2 tablet on Mondays, Wednesdays and Fridays.   Recheck INR in 4 weeks.

## 2023-12-04 ENCOUNTER — Ambulatory Visit: Payer: Medicare Other | Attending: Internal Medicine

## 2023-12-04 DIAGNOSIS — Z5181 Encounter for therapeutic drug level monitoring: Secondary | ICD-10-CM | POA: Insufficient documentation

## 2023-12-04 DIAGNOSIS — I2602 Saddle embolus of pulmonary artery with acute cor pulmonale: Secondary | ICD-10-CM | POA: Insufficient documentation

## 2023-12-04 LAB — POCT INR: INR: 4 — AB (ref 2.0–3.0)

## 2023-12-04 NOTE — Patient Instructions (Signed)
HOLD TODAY and THURSDAY THEN Decrease to 0.5 tablet daily except 1 tablet on Mondays, Wednesdays and Fridays.   Recheck INR in 4 weeks.

## 2023-12-12 ENCOUNTER — Ambulatory Visit
Admission: EM | Admit: 2023-12-12 | Discharge: 2023-12-12 | Disposition: A | Discharge status: Home / Self Care | Payer: Medicare Other | Attending: Emergency Medicine | Admitting: Emergency Medicine

## 2023-12-12 DIAGNOSIS — R300 Dysuria: Secondary | ICD-10-CM

## 2023-12-12 LAB — POCT URINALYSIS DIP (MANUAL ENTRY)
Bilirubin, UA: NEGATIVE
Glucose, UA: NEGATIVE mg/dL
Ketones, POC UA: NEGATIVE mg/dL
Nitrite, UA: NEGATIVE
Protein Ur, POC: NEGATIVE mg/dL
Spec Grav, UA: 1.015 (ref 1.010–1.025)
Urobilinogen, UA: 0.2 U/dL
pH, UA: 5.5 (ref 5.0–8.0)

## 2023-12-12 MED ORDER — CEPHALEXIN 500 MG PO CAPS: 500.0000 mg | ORAL_CAPSULE | Freq: Two times a day (BID) | ORAL | 0 refills | Status: AC

## 2023-12-12 NOTE — Discharge Instructions (Addendum)
Follow up with your primary care provider tomorrow.  Go to the emergency department if you have worsening symptoms.    Take the antibiotic as directed.  The urine culture is pending.  We will call you if it shows the need to change or discontinue your antibiotic.

## 2023-12-12 NOTE — ED Provider Notes (Signed)
Adam Knight    CSN: [id number] Arrival date & time: 12/12/23  1738      History   Chief Complaint Chief Complaint  Patient presents with   burning with urination    HPI Adam Knight is a 82 y.o. male.  Patient presents with 1 week history of dysuria.  He has been treating this with Tylenol.  He states he has some hesitancy with starting a urine stream but feels like he is able to fully empty his bladder and does not have any lower abdominal pain.  He denies fever, flank pain, or other symptoms.  Patient is on warfarin.  The history is provided by the patient and medical records.    Past Medical History:  Diagnosis Date   Actinic keratosis    Atypical mole 04/18/2020   R temple Atypical Letiginous melanocytic proliferation with regression, clear post biopsy   Basal cell carcinoma 04/18/2020   Right mid upper forehead, MOHs 06/22/20   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HCC)    Dyslipidemia    Edema    Gastric ulcer    OSA on CPAP    Prediabetes    Pulmonary embolism (HCC)    Seasonal allergies     Patient Active Problem List   Diagnosis Date Noted   Chronic kidney disease, stage 3b (HCC) 08/21/2023   Chronic pain of left knee 05/10/2023   Sinus tachycardia 11/21/2022   Bruit of right carotid artery 11/21/2022   Pain in both lower extremities 08/10/2020   Personal history of other malignant neoplasm of skin 06/22/2020   Left leg swelling 02/03/2020   Anticoagulated on Coumadin 08/20/2019   Chronic venous insufficiency 08/20/2019   Aspirin intolerance 06/02/2019   Chronic kidney disease, stage 3a (HCC) 05/19/2019   Physical deconditioning 11/10/2018   Decreased strength, endurance, and mobility 11/10/2018   Decreased activities of daily living (ADL) 10/26/2018   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   Mild cognitive impairment 05/08/2018   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HCC) 05/02/2018   Atypical chest pain  05/02/2018   Palpitations 05/02/2018   Long term (current) use of anticoagulants 02/18/2018   Pulmonary embolus (HCC) 01/06/2018   Primary osteoarthritis of both knees 11/10/2017   Lymphedema of both lower extremities 10/25/2017   Hyperlipidemia, mixed 06/01/2016   Mild depression 06/01/2016   Dyspnea on exertion 03/01/2016   Non morbid obesity due to excess calories 03/01/2016   PVD (peripheral vascular disease) (HCC) 03/01/2016   Primary osteoarthritis of left knee 04/20/2015   Primary osteoarthritis of right knee 04/20/2015   BMI 40.0-44.9, adult (HCC) 04/20/2015   DOE (dyspnea on exertion) 01/26/2015   OSA on CPAP 01/26/2015   Morbid obesity (HCC) 01/26/2015   Chronic edema 01/13/2015   Hyperglycemia, unspecified 01/13/2015   Nephrolithiasis 01/13/2015   Obstructive sleep apnea 05/08/2013    Past Surgical History:  Procedure Laterality Date   ENDARTERECTOMY  10/2018   Pulmonary endarterectomy for CTEPH - [facility]   PULMONARY VENOGRAPHY Bilateral 01/08/2018   Procedure: PULMONARY VENOGRAPHY possible thrombolysis;  Surgeon: Renford Dills, MD;  Location: [facility] INVASIVE CV LAB;  Service: Cardiovascular;  Laterality: Bilateral;   TONSILLECTOMY         Home Medications    Prior to Admission medications   Medication Sig Start Date End Date Taking? Authorizing Provider  cephALEXin (KEFLEX) 500 MG capsule Take 1 capsule (500 mg total) by mouth 2 (two) times daily for 5 days. 12/12/23 12/17/23 Yes Mickie Bail, NP  Cholecalciferol (  VITAMIN D) 2000 UNITS tablet Take 2,000 Units by mouth daily.   Yes [provider]  potassium chloride (KLOR-CON) 10 MEQ tablet Take by mouth. 40 MEQ IN THE MORNING AND 30 MEQ AT NIGHT. 07/22/19  Yes [provider]  torsemide (DEMADEX) 20 MG tablet Take 2 tablets (40 mg total) by mouth daily. 04/01/19  Yes End, Cristal Deer, MD  warfarin (COUMADIN) 5 MG tablet TAKE 1/2 TO 1 TABLET BY MOUTH DAILY AS DIRECTED BY THE COUMADIN CLINIC.  05/13/23  Yes End, Cristal Deer, MD  Calcium Carbonate Antacid (CALCIUM CARBONATE, DOSED IN MG ELEMENTAL CALCIUM,) 1250 MG/5ML SUSP Take by mouth daily.    [provider]  Multiple Vitamins-Minerals (PRESERVISION AREDS PO) Take by mouth.    [provider]    Family History Family History  Problem Relation Age of Onset   Stroke Mother    Coronary artery disease Mother    Osteoporosis Mother    Rheum arthritis Mother    Thyroid disease Mother     Social History Social History   Tobacco Use   Smoking status: Former    Current packs/day: 0.00    Average packs/day: 0.3 packs/day for 7.0 years (2.1 ttl pk-yrs)    Types: Cigarettes    Start date: 58    Quit date: 12    Years since quitting: 58.0   Smokeless tobacco: Never  Vaping Use   Vaping status: Never Used  Substance Use Topics   Alcohol use: Not Currently   Drug use: No     Allergies   Sulfa antibiotics, Spironolactone, and Penicillins   Review of Systems Review of Systems  Constitutional:  Negative for chills and fever.  Gastrointestinal:  Negative for abdominal pain, nausea and vomiting.  Genitourinary:  Positive for dysuria. Negative for flank pain and hematuria.       Urinary hesitancy.     Physical Exam Triage Vital Signs ED Triage Vitals [12/12/23 1805]  Encounter Vitals Group     BP 130/83     Systolic BP Percentile      Diastolic BP Percentile      Pulse Rate 100     Resp 18     Temp 98.8 F (37.1 C)     Temp src      SpO2 95 %     Weight      Height      Head Circumference      Peak Flow      Pain Score      Pain Loc      Pain Education      Exclude from Growth Chart    No data found.  Updated Vital Signs BP 130/83 (BP Location: Left Arm)   Pulse 100   Temp 98.8 F (37.1 C)   Resp 18   SpO2 95%   Visual Acuity Right Eye Distance:   Left Eye Distance:   Bilateral Distance:    Right Eye Near:   Left Eye Near:    Bilateral Near:     Physical  Exam Constitutional:      General: He is not in acute distress. HENT:     Mouth/Throat:     Mouth: Mucous membranes are moist.  Cardiovascular:     Rate and Rhythm: Normal rate and regular rhythm.  Pulmonary:     Effort: Pulmonary effort is normal. No respiratory distress.  Abdominal:     General: Bowel sounds are normal.     Palpations: Abdomen is soft.  Tenderness: There is no abdominal tenderness. There is no right CVA tenderness, left CVA tenderness, guarding or rebound.  Skin:    General: Skin is warm and dry.  Neurological:     Mental Status: He is alert.      UC Treatments / Results  Labs (all labs ordered are listed, but only abnormal results are displayed) Labs Reviewed  POCT URINALYSIS DIP (MANUAL ENTRY) - Abnormal; Notable for the following components:      Result Value   Clarity, UA cloudy (*)    Blood, UA moderate (*)    Leukocytes, UA Moderate (2+) (*)    All other components within normal limits  URINE CULTURE    EKG   Radiology No results found.  Procedures Procedures (including critical care time)  Medications Ordered in UC Medications - No data to display  Initial Impression / Assessment and Plan / UC Course  I have reviewed the triage vital signs and the nursing notes.  Pertinent labs & imaging results that were available during my care of the patient were reviewed by me and considered in my medical decision making (see chart for details).    Dysuria.  Afebrile and vital signs are stable.  Patient reports hesitancy with his urine stream but states he is able to fully empty his bladder and does not have any bladder discomfort.  His abdomen is soft and nontender.  He is allergic to sulfa and penicillin.  He is on warfarin.  Treating today with cephalexin.  Urine culture pending.  Instructed him to follow-up with his PCP tomorrow.  ED precautions given.  He agrees to plan of care.  Final Clinical Impressions(s) / UC Diagnoses   Final  diagnoses:  Dysuria     Discharge Instructions      Follow up with your primary care provider tomorrow.  Go to the emergency department if you have worsening symptoms.    Take the antibiotic as directed.  The urine culture is pending.  We will call you if it shows the need to change or discontinue your antibiotic.        ED Prescriptions     Medication Sig Dispense Auth. Provider   cephALEXin (KEFLEX) 500 MG capsule Take 1 capsule (500 mg total) by mouth 2 (two) times daily for 5 days. 10 capsule Mickie Bail, NP      PDMP not reviewed this encounter.   Mickie Bail, NP 12/12/23 [phone]

## 2023-12-12 NOTE — ED Triage Notes (Signed)
Pt states he is having burning pain with urination that started a week ago. Taking tylenol.

## 2023-12-14 LAB — URINE CULTURE: Culture: >=100000 — AB

## 2023-12-16 ENCOUNTER — Telehealth (HOSPITAL_COMMUNITY): Payer: Self-pay

## 2023-12-16 MED ORDER — CIPROFLOXACIN HCL 250 MG PO TABS: 250.0000 mg | ORAL_TABLET | Freq: Two times a day (BID) | ORAL | 0 refills | Status: DC

## 2023-12-16 NOTE — Telephone Encounter (Signed)
Per A. Hermanns, PA-C, "I would do cipro and make sure he montiors his INR since he is on the warfarin."  Reviewed with patient, verified pharmacy, prescription sent

## 2023-12-31 ENCOUNTER — Telehealth: Payer: Self-pay | Admitting: Internal Medicine

## 2023-12-31 NOTE — Telephone Encounter (Signed)
 Patient states he was advised to call back when he is ready to schedule a procedure with Dr. Okey Dupre. He states he is ready to schedule, but he does not remember the name of the procedure. Please advise.

## 2023-12-31 NOTE — Telephone Encounter (Signed)
 Since it has been over 4 months since our last visit, I recommend that [name] see me or an APP at his convenience to discuss and schedule right heart catheterization.  It can be a virtual visit if that is more convenient for him.  Lonni Hanson, MD [facility]

## 2024-01-01 NOTE — Telephone Encounter (Signed)
 LVM to schedule appt

## 2024-01-08 ENCOUNTER — Ambulatory Visit: Payer: Medicare Other | Attending: Internal Medicine

## 2024-01-08 DIAGNOSIS — I2602 Saddle embolus of pulmonary artery with acute cor pulmonale: Secondary | ICD-10-CM | POA: Insufficient documentation

## 2024-01-08 DIAGNOSIS — Z5181 Encounter for therapeutic drug level monitoring: Secondary | ICD-10-CM | POA: Insufficient documentation

## 2024-01-08 LAB — POCT INR: INR: 3 (ref 2.0–3.0)

## 2024-01-08 NOTE — Patient Instructions (Signed)
 Continue 0.5 tablet daily except 1 tablet on Mondays, Wednesdays and Fridays.   Recheck INR in 6 weeks.  [phone]

## 2024-01-31 ENCOUNTER — Telehealth (INDEPENDENT_AMBULATORY_CARE_PROVIDER_SITE_OTHER): Payer: Self-pay | Admitting: Vascular Surgery

## 2024-01-31 NOTE — Telephone Encounter (Signed)
 Patient left voicemail to schedule with [facility] for left ankle (per pt). LS 12/26/2020. Spoke to patient and advised we need new referral for new issue. Patient stated that he just saw his pcp for issue. Advised that referral may be still coming over from [facility] and we'd be happy to schedule once we receive it.

## 2024-02-10 ENCOUNTER — Encounter: Payer: Self-pay | Admitting: Podiatry

## 2024-02-10 ENCOUNTER — Ambulatory Visit (INDEPENDENT_AMBULATORY_CARE_PROVIDER_SITE_OTHER): Payer: Medicare Other

## 2024-02-10 ENCOUNTER — Ambulatory Visit (INDEPENDENT_AMBULATORY_CARE_PROVIDER_SITE_OTHER): Payer: Medicare Other | Admitting: Podiatry

## 2024-02-10 DIAGNOSIS — M7752 Other enthesopathy of left foot: Secondary | ICD-10-CM

## 2024-02-10 DIAGNOSIS — M19072 Primary osteoarthritis, left ankle and foot: Secondary | ICD-10-CM | POA: Diagnosis not present

## 2024-02-10 DIAGNOSIS — M779 Enthesopathy, unspecified: Secondary | ICD-10-CM
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
## 2024-02-19 ENCOUNTER — Ambulatory Visit: Payer: Medicare Other | Attending: Internal Medicine

## 2024-02-19 DIAGNOSIS — Z5181 Encounter for therapeutic drug level monitoring: Secondary | ICD-10-CM

## 2024-02-19 DIAGNOSIS — I2602 Saddle embolus of pulmonary artery with acute cor pulmonale: Secondary | ICD-10-CM | POA: Diagnosis not present

## 2024-02-19 LAB — POCT INR: INR: 2.4 (ref 2.0–3.0)

## 2024-02-19 NOTE — Patient Instructions (Signed)
 Continue 0.5 tablet daily except 1 tablet on Mondays, Wednesdays and Fridays.   Recheck INR in 6 weeks.  [phone]

## 2024-02-20 ENCOUNTER — Ambulatory Visit: Payer: Medicare Other | Attending: Internal Medicine | Admitting: Internal Medicine

## 2024-02-20 ENCOUNTER — Encounter: Payer: Self-pay | Admitting: Internal Medicine

## 2024-02-20 VITALS — BP 120/70 | HR 76 | Ht 62.0 in | Wt 209.1 lb

## 2024-02-20 DIAGNOSIS — G8929 Other chronic pain: Secondary | ICD-10-CM | POA: Insufficient documentation

## 2024-02-20 DIAGNOSIS — Z01810 Encounter for preprocedural cardiovascular examination: Secondary | ICD-10-CM | POA: Insufficient documentation

## 2024-02-20 DIAGNOSIS — M25562 Pain in left knee: Secondary | ICD-10-CM | POA: Insufficient documentation

## 2024-02-20 DIAGNOSIS — I2724 Chronic thromboembolic pulmonary hypertension: Secondary | ICD-10-CM | POA: Insufficient documentation

## 2024-02-20 NOTE — Patient Instructions (Signed)
 Medication Instructions:  Your physician recommends that you continue on your current medications as directed. Please refer to the Current Medication list given to you today.   *If you need a refill on your cardiac medications before your next appointment, please call your pharmacy*   Lab Work: Your provider would like for you to have following labs drawn today (CBC, BMP).     Testing/Procedures: Your physician has requested that you have a cardiac catheterization. Cardiac catheterization is used to diagnose and/or treat various heart conditions. Doctors may recommend this procedure for a number of different reasons. The most common reason is to evaluate chest pain. Chest pain can be a symptom of coronary artery disease (CAD), and cardiac catheterization can show whether plaque is narrowing or blocking your heart's arteries. This procedure is also used to evaluate the valves, as well as measure the blood flow and oxygen levels in different parts of your heart. For further information please visit [email or web address] Please follow instruction sheet, as given. Please see instructions below   Follow-Up: At [facility], you and your health needs are our priority.  As part of our continuing mission to provide you with exceptional heart care, we have created designated Provider Care Teams.  These Care Teams include your primary Cardiologist (physician) and Advanced Practice Providers (APPs -  Physician Assistants and Nurse Practitioners) who all work together to provide you with the care you need, when you need it.  We recommend signing up for the patient portal called "MyChart".  Sign up information is provided on this After Visit Summary.  MyChart is used to connect with patients for Virtual Visits (Telemedicine).  Patients are able to view lab/test results, encounter notes, upcoming appointments, etc.  Non-urgent messages can be sent to your provider as well.   To learn more about what you  can do with MyChart, go to [email or web address].    Your next appointment:   6 month(s)  Provider:   You may see Yvonne Kendall, MD or one of the following Advanced Practice Providers on your designated Care Team:   Nicolasa Ducking, NP Eula Listen, PA-C Cadence Fransico Michael, PA-C Charlsie Quest, NP Carlos Levering, NP     [facility] A DEPT OF [facility] AT [facility] [address] [place] [address] Dept: [phone] Loc: [phone]  Sohil Timko  02/20/2024  You are scheduled for a Cardiac Catheterization on Tuesday, March 4 with Dr. Cristal Deer End.  1. Please arrive at the [facility] Entrance of [facility], [address], [place] [address] at 9:00 AM (This is 1 hour(s) prior to your procedure time).  Proceed to the Check-In Desk directly inside the entrance.  Procedure Parking: Use the entrance off of the [address] side of the hospital. Turn right upon entering and follow the driveway to parking that is directly in front of the Heart [facility] Vascular Center. There is no valet parking available at this entrance, however there is an awning directly in front of the Heart & Vascular Center for drop off/ pick up for patients.  Special note: Every effort is made to have your procedure done on time. Please understand that emergencies sometimes delay scheduled procedures.  2. Diet: Do not eat solid foods after midnight.  The patient may have clear liquids until 5am upon the day of the procedure.  3. Labs: You will need to have blood drawn today (CBC, BMP)  4. Medication instructions in preparation  for your procedure:   Contrast Allergy: No  Hold torsemide morning of procedure Continue to take Coumadin as prescribed   5. Plan to go home the same day, you will only stay overnight if medically necessary. 6. Bring a current list of your medications and current insurance cards. 7. You  MUST have a responsible person to drive you home. 8. Someone MUST be with you the first 24 hours after you arrive home or your discharge will be delayed. 9. Please wear clothes that are easy to get on and off and wear slip-on shoes.  Thank you for allowing [place] to care for you!   -- [place] [facility]

## 2024-02-20 NOTE — H&P (View-Only) (Signed)
 Cardiology Office Note:  .   Date:  02/20/2024  ID:  Francene Finders, DOB 06-Apr-1941, MRN [id number] PCP: Orene Desanctis, MD   HeartCare Providers Cardiologist:  Yvonne Kendall, MD     History of Present Illness: .   Adam Knight is a 83 y.o. 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  I last saw him in August, at which time he reported stable exertional dyspnea.  We again discussed proceeding with right heart catheterization, which Adam Knight was reluctant to pursue though he ultimately reached out to [place] last month inquiring about moving forward with this.  Today, Adam Knight reports that he feels similar to prior visits.  He notes intermittent shortness of breath, though sometimes he wonders if it is actually an irregular heartbeat.  He denies chest pain and lightheadedness.  He notes mild lower extremity edema for which he uses compression stockings.  He remains compliant with warfarin and has not had any bleeding.  He is still considering undergoing knee replacement, though he is concerned about perioperative risks at his age.  ROS: See HPI  Studies Reviewed: Marland Kitchen   EKG Interpretation Date/Time:  Thursday February 20 2024 09:17:40 EST Ventricular Rate:  76 PR Interval:  186 QRS Duration:  170 QT Interval:  454 QTC Calculation: 510 R Axis:   -59  Text Interpretation: Normal sinus rhythm Left axis deviation Right bundle branch block When compared with ECG of 10-May-2023 No significant change was found Confirmed by Tryphena Perkovich, Cristal Deer [id number]) on 02/20/2024 2:24:42 PM    TTE (05/14/2023): Normal LV size and wall thickness.  Interventricular septum flattened in systole consistent with right ventricular pressure overload.  LVEF 55-60% with grade 1 diastolic dysfunction.   Normal RV size and wall thickness with moderately reduced function.  Unable to assess RVSP.  Normal biatrial size.  No pericardial effusion.  No significant valvular abnormalities.  CVP approximately 8 mmHg.  Risk Assessment/Calculations:             Physical Exam:   VS:  BP 120/70 (BP Location: Left Arm, Patient Position: Sitting, Cuff Size: Normal)   Pulse 76   Ht 5\' 2"  (1.575 m)   Wt 209 lb 2 oz (94.9 kg)   SpO2 96%   BMI 38.25 kg/m    Wt Readings from Last 3 Encounters:  02/20/24 209 lb 2 oz (94.9 kg)  08/21/23 206 lb 12.8 oz (93.8 kg)  05/10/23 216 lb (98 kg)    General:  NAD. Neck: No JVD or HJR. Lungs: Clear to auscultation bilaterally without wheezes or crackles. Heart: Regular rate and rhythm without murmurs, rubs, or gallops. Abdomen: Soft, nontender, nondistended. Extremities: Trace pretibial edema bilaterally  ASSESSMENT AND PLAN: .    Chronic thromboembolic pulmonary hypertension: Adam Knight has stable NYHA class II-III symptoms and mild leg edema.  As his prior echos have demonstrated RV dysfunction and signs of pressure overload without being able to estimate RVSP, we have agreed to move forward with right heart catheterization.  We will plan for brachial vein approach without need to discontinue warfarin.  I will check a CBC and BMP in anticipation of this today.  INR therapeutic on last check yesterday with plans to continue indefinite warfarin therapy.  Knee pain and preop evaluation: Adam Knight is still contemplating knee replacement.  I think it is  prudent to better understand his pulmonary hypertension and hemodynamics, given his history of CTEPH before moving forward with surgery.  Further recommendations will be provided after [facility]HC.    Informed Consent   Shared Decision Making/Informed Consent The risks [stroke (1 in 1000), arrhythmia, cardiac injury, death (1 in 1000), bleeding (1 in 200), , benefits (diagnostic support and management of coronary artery  disease) and alternatives of a cardiac catheterization were discussed in detail with Adam Knight and he is willing to proceed.     Dispo: Return to clinic in 6 months.  Signed, Yvonne Kendall, MD

## 2024-02-20 NOTE — Progress Notes (Signed)
 Cardiology Office Note:  .   Date:  02/20/2024  ID:  Adam Knight, DOB 06-Apr-1941, MRN [id number] PCP: Orene Desanctis, MD   [facility] Providers Cardiologist:  Yvonne Kendall, MD     History of Present Illness: .   Adam Knight is a 83 y.o. 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  I last saw him in August, at which time he reported stable exertional dyspnea.  We again discussed proceeding with right heart catheterization, which Adam Knight was reluctant to pursue though he ultimately reached out to [place] last month inquiring about moving forward with this.  Today, Adam Knight reports that he feels similar to prior visits.  He notes intermittent shortness of breath, though sometimes he wonders if it is actually an irregular heartbeat.  He denies chest pain and lightheadedness.  He notes mild lower extremity edema for which he uses compression stockings.  He remains compliant with warfarin and has not had any bleeding.  He is still considering undergoing knee replacement, though he is concerned about perioperative risks at his age.  ROS: See HPI  Studies Reviewed: Marland Kitchen   EKG Interpretation Date/Time:  Thursday February 20 2024 09:17:40 EST Ventricular Rate:  76 PR Interval:  186 QRS Duration:  170 QT Interval:  454 QTC Calculation: 510 R Axis:   -59  Text Interpretation: Normal sinus rhythm Left axis deviation Right bundle branch block When compared with ECG of 10-May-2023 No significant change was found Confirmed by Tryphena Perkovich, Cristal Deer [id number]) on 02/20/2024 2:24:42 PM    TTE (05/14/2023): Normal LV size and wall thickness.  Interventricular septum flattened in systole consistent with right ventricular pressure overload.  LVEF 55-60% with grade 1 diastolic dysfunction.   Normal RV size and wall thickness with moderately reduced function.  Unable to assess RVSP.  Normal biatrial size.  No pericardial effusion.  No significant valvular abnormalities.  CVP approximately 8 mmHg.  Risk Assessment/Calculations:             Physical Exam:   VS:  BP 120/70 (BP Location: Left Arm, Patient Position: Sitting, Cuff Size: Normal)   Pulse 76   Ht 5\' 2"  (1.575 m)   Wt 209 lb 2 oz (94.9 kg)   SpO2 96%   BMI 38.25 kg/m    Wt Readings from Last 3 Encounters:  02/20/24 209 lb 2 oz (94.9 kg)  08/21/23 206 lb 12.8 oz (93.8 kg)  05/10/23 216 lb (98 kg)    General:  NAD. Neck: No JVD or HJR. Lungs: Clear to auscultation bilaterally without wheezes or crackles. Heart: Regular rate and rhythm without murmurs, rubs, or gallops. Abdomen: Soft, nontender, nondistended. Extremities: Trace pretibial edema bilaterally  ASSESSMENT AND PLAN: .    Chronic thromboembolic pulmonary hypertension: Adam Knight has stable NYHA class II-III symptoms and mild leg edema.  As his prior echos have demonstrated RV dysfunction and signs of pressure overload without being able to estimate RVSP, we have agreed to move forward with right heart catheterization.  We will plan for brachial vein approach without need to discontinue warfarin.  I will check a CBC and BMP in anticipation of this today.  INR therapeutic on last check yesterday with plans to continue indefinite warfarin therapy.  Knee pain and preop evaluation: Adam Knight is still contemplating knee replacement.  I think it is  prudent to better understand his pulmonary hypertension and hemodynamics, given his history of CTEPH before moving forward with surgery.  Further recommendations will be provided after RHC.    Informed Consent   Shared Decision Making/Informed Consent The risks [stroke (1 in 1000), arrhythmia, cardiac injury, death (1 in 1000), bleeding (1 in 200), , benefits (diagnostic support and management of coronary artery  disease) and alternatives of a cardiac catheterization were discussed in detail with Adam Knight and he is willing to proceed.     Dispo: Return to clinic in 6 months.  Signed, Yvonne Kendall, MD

## 2024-02-21 LAB — BASIC METABOLIC PANEL
BUN/Creatinine Ratio: 21 (ref 10–24)
BUN: 33 mg/dL — ABNORMAL HIGH (ref 8–27)
CO2: 26 mmol/L (ref 20–29)
Calcium: 9.3 mg/dL (ref 8.6–10.2)
Chloride: 101 mmol/L (ref 96–106)
Creatinine, Ser: 1.54 mg/dL — ABNORMAL HIGH (ref 0.76–1.27)
Glucose: 96 mg/dL (ref 70–99)
Potassium: 4.7 mmol/L (ref 3.5–5.2)
Sodium: 144 mmol/L (ref 134–144)
eGFR: 45 mL/min/{1.73_m2} — ABNORMAL LOW (ref >59)

## 2024-02-21 LAB — CBC
Hematocrit: 48 % (ref 37.5–51.0)
Hemoglobin: 15.3 g/dL (ref 13.0–17.7)
MCH: 27.8 pg (ref 26.6–33.0)
MCHC: 31.9 g/dL (ref 31.5–35.7)
MCV: 87 fL (ref 79–97)
Platelets: 432 10*3/uL (ref 150–450)
RBC: 5.5 x10E6/uL (ref 4.14–5.80)
RDW: 14.5 % (ref 11.6–15.4)
WBC: 6.2 10*3/uL (ref 3.4–10.8)

## 2024-02-25 ENCOUNTER — Ambulatory Visit
Admission: RE | Admit: 2024-02-25 | Discharge: 2024-02-25 | Disposition: A | Discharge status: Home / Self Care | Payer: Medicare Other | Attending: Internal Medicine | Admitting: Internal Medicine

## 2024-02-25 ENCOUNTER — Encounter
Admission: RE | Disposition: A | Discharge status: Home / Self Care | Payer: Self-pay | Source: Home / Self Care | Attending: Internal Medicine

## 2024-02-25 DIAGNOSIS — Z86711 Personal history of pulmonary embolism: Secondary | ICD-10-CM | POA: Insufficient documentation

## 2024-02-25 DIAGNOSIS — I272 Pulmonary hypertension, unspecified: Secondary | ICD-10-CM

## 2024-02-25 DIAGNOSIS — M25569 Pain in unspecified knee: Secondary | ICD-10-CM | POA: Diagnosis not present

## 2024-02-25 DIAGNOSIS — G4733 Obstructive sleep apnea (adult) (pediatric): Secondary | ICD-10-CM | POA: Diagnosis not present

## 2024-02-25 DIAGNOSIS — Z7901 Long term (current) use of anticoagulants: Secondary | ICD-10-CM | POA: Insufficient documentation

## 2024-02-25 DIAGNOSIS — I2724 Chronic thromboembolic pulmonary hypertension: Secondary | ICD-10-CM

## 2024-02-25 DIAGNOSIS — E785 Hyperlipidemia, unspecified: Secondary | ICD-10-CM | POA: Insufficient documentation

## 2024-02-25 DIAGNOSIS — Z8711 Personal history of peptic ulcer disease: Secondary | ICD-10-CM | POA: Insufficient documentation

## 2024-02-25 HISTORY — PX: RIGHT HEART CATH: CATH118263

## 2024-02-25 LAB — POCT I-STAT EG7
Acid-Base Excess: 3 mmol/L — ABNORMAL HIGH (ref 0.0–2.0)
Bicarbonate: 28.3 mmol/L — ABNORMAL HIGH (ref 20.0–28.0)
Calcium, Ion: 1.14 mmol/L — ABNORMAL LOW (ref 1.15–1.40)
HCT: 43 % (ref 39.0–52.0)
Hemoglobin: 14.6 g/dL (ref 13.0–17.0)
O2 Saturation: 67 %
Potassium: 3.9 mmol/L (ref 3.5–5.1)
Sodium: 139 mmol/L (ref 135–145)
TCO2: 30 mmol/L (ref 22–32)
pCO2, Ven: 45.9 mmHg (ref 44–60)
pH, Ven: 7.398 (ref 7.25–7.43)
pO2, Ven: 35 mmHg (ref 32–45)

## 2024-02-25 SURGERY — RIGHT HEART CATH
Anesthesia: Moderate Sedation | Laterality: Right

## 2024-02-25 MED ORDER — HYDRALAZINE HCL 20 MG/ML IJ SOLN: 10.0000 mg | INTRAMUSCULAR | Status: DC | PRN

## 2024-02-25 MED ORDER — SODIUM CHLORIDE 0.9 % IV SOLN: 250.0000 mL | INTRAVENOUS | Status: DC | PRN

## 2024-02-25 MED ORDER — SODIUM CHLORIDE 0.9% FLUSH: 3.0000 mL | Freq: Two times a day (BID) | INTRAVENOUS | Status: DC

## 2024-02-25 MED ORDER — LIDOCAINE HCL 1 % IJ SOLN
INTRAMUSCULAR | Status: AC
  Filled 2024-02-25: qty 20

## 2024-02-25 MED ORDER — ACETAMINOPHEN 325 MG PO TABS: 650.0000 mg | ORAL_TABLET | ORAL | Status: DC | PRN

## 2024-02-25 MED ORDER — SODIUM CHLORIDE 0.9 % IV SOLN
INTRAVENOUS | Status: DC
Start: 2024-02-25 — End: 2024-02-25

## 2024-02-25 MED ORDER — HEPARIN (PORCINE) IN NACL 2000-0.9 UNIT/L-% IV SOLN
INTRAVENOUS | Status: DC | PRN
  Administered 2024-02-25: 1000 mL

## 2024-02-25 MED ORDER — SODIUM CHLORIDE 0.9% FLUSH: 3.0000 mL | INTRAVENOUS | Status: DC | PRN

## 2024-02-25 MED ORDER — LIDOCAINE HCL (PF) 1 % IJ SOLN
INTRAMUSCULAR | Status: DC | PRN
  Administered 2024-02-25: 2 mL

## 2024-02-25 MED ORDER — HEPARIN (PORCINE) IN NACL 1000-0.9 UT/500ML-% IV SOLN
INTRAVENOUS | Status: AC
  Filled 2024-02-25: qty 500

## 2024-02-25 SURGICAL SUPPLY — 7 items
CATH BALLN WEDGE 5F 110CM (CATHETERS) IMPLANT
DRAPE BRACHIAL (DRAPES) IMPLANT
PACK CARDIAC CATH (CUSTOM PROCEDURE TRAY) ×1 IMPLANT
PROTECTION STATION PRESSURIZED (MISCELLANEOUS) ×1 IMPLANT
SET ATX-X65L (MISCELLANEOUS) IMPLANT
SHEATH GLIDE SLENDER 4/5FR (SHEATH) IMPLANT
STATION PROTECTION PRESSURIZED (MISCELLANEOUS) IMPLANT

## 2024-02-25 NOTE — Interval H&P Note (Signed)
 History and Physical Interval Note:  02/25/2024 10:17 AM  Adam Knight  has presented today for surgery, with pulmonary hyertension.  The various methods of treatment have been discussed with the patient and family. After consideration of risks, benefits and other options for treatment, the patient has consented to  Procedure(s): RIGHT HEART CATH (Right) as a surgical intervention.  The patient's history has been reviewed, patient examined, no change in status, stable for surgery.  I have reviewed the patient's chart and labs.  Questions were answered to the patient's satisfaction.     Vasili Fok

## 2024-02-25 NOTE — Discharge Instructions (Signed)
Right Heart Cath, Care After This sheet gives you information about how to care for yourself after your procedure. Your health care provider may also give you more specific instructions. If you have problems or questions, contact your health care provider. What can I expect after the procedure? After the procedure, it is common to have: Bruising or mild discomfort in the area where the IV was inserted (insertion site). Follow these instructions at home: Eating and drinking  You may eat and drink after your procedure.  Drink a lot of fluids for the first several days after the procedure, as directed by your health care provider. This helps to wash (flush) the contrast out of your body. Examples of healthy fluids include water or low-calorie drinks. General instructions Check your IV insertion area and also your venous access site every day for signs of infection. Check for: Redness, swelling, or pain. Fluid or blood. Warmth. Pus or a bad smell. Take over-the-counter and prescription medicines only as told by your health care provider. Rest and return to your normal activities as told by your health care provider. Ask your health care provider what activities are safe for you. Do not drive for 24 hours if you were given a medicine to help you relax (sedative), or until your health care provider approves. Keep all follow-up visits as told by your health care provider. This is important. Contact a health care provider if: Your skin becomes itchy or you develop a rash or hives. You have a fever that does not get better with medicine. You feel nauseous. You vomit. You have redness, swelling, or pain around the insertion site. You have fluid or blood coming from the insertion site. Your insertion area feels warm to the touch. You have pus or a bad smell coming from the insertion site. Get help right away if: You have difficulty breathing or shortness of breath. You develop chest pain. You  faint. You feel very dizzy. These symptoms may represent a serious problem that is an emergency. Do not wait to see if the symptoms will go away. Get medical help right away. Call your local emergency services (911 in the [place] Do not drive yourself to the hospital. Summary After your procedure, it is common to have bruising or mild discomfort in the area where the IV was inserted. You should check your IV insertion area every day for signs of infection. Take over-the-counter and prescription medicines only as told by your health care provider. You should drink a lot of fluids for the first several days after the procedure to help flush the contrast from your body. This information is not intended to replace advice given to you by your health care provider. Make sure you discuss any questions you have with your health care provider. Document Released: 09/30/2013 Document Revised: 11/22/2017 Document Reviewed: 11/03/2016 Elsevier Patient Education  2020 [employer] 

## 2024-02-26 ENCOUNTER — Encounter: Payer: Self-pay | Admitting: Internal Medicine

## 2024-03-03 ENCOUNTER — Ambulatory Visit (INDEPENDENT_AMBULATORY_CARE_PROVIDER_SITE_OTHER): Payer: Medicare Other | Admitting: Dermatology

## 2024-03-03 ENCOUNTER — Telehealth: Payer: Self-pay | Admitting: Internal Medicine

## 2024-03-03 DIAGNOSIS — L570 Actinic keratosis: Secondary | ICD-10-CM

## 2024-03-03 DIAGNOSIS — L578 Other skin changes due to chronic exposure to nonionizing radiation: Secondary | ICD-10-CM | POA: Diagnosis not present

## 2024-03-03 DIAGNOSIS — L814 Other melanin hyperpigmentation: Secondary | ICD-10-CM

## 2024-03-03 DIAGNOSIS — L821 Other seborrheic keratosis: Secondary | ICD-10-CM

## 2024-03-03 DIAGNOSIS — Z85828 Personal history of other malignant neoplasm of skin: Secondary | ICD-10-CM

## 2024-03-03 DIAGNOSIS — W908XXA Exposure to other nonionizing radiation, initial encounter: Secondary | ICD-10-CM

## 2024-03-03 DIAGNOSIS — D692 Other nonthrombocytopenic purpura: Secondary | ICD-10-CM

## 2024-03-03 DIAGNOSIS — Z872 Personal history of diseases of the skin and subcutaneous tissue: Secondary | ICD-10-CM

## 2024-03-03 DIAGNOSIS — L219 Seborrheic dermatitis, unspecified: Secondary | ICD-10-CM | POA: Diagnosis not present

## 2024-03-03 MED ORDER — FLUOCINONIDE 0.05 % EX SOLN: 1.0000 | Freq: Two times a day (BID) | CUTANEOUS | 3 refills | Status: AC

## 2024-03-03 MED ORDER — KETOCONAZOLE 2 % EX SHAM: 1.0000 | MEDICATED_SHAMPOO | Freq: Once | CUTANEOUS | 3 refills | Status: AC

## 2024-03-03 MED ORDER — WARFARIN SODIUM 5 MG PO TABS: ORAL_TABLET | ORAL | 1 refills | Status: DC

## 2024-03-03 NOTE — Telephone Encounter (Signed)
*  STAT* If patient is at the pharmacy, call can be transferred to refill team.   1. Which medications need to be refilled? (please list name of each medication and dose if known) warfarin (COUMADIN) 5 MG tablet   2. Which pharmacy/location (including street and city if local pharmacy) is medication to be sent to? [id number] [address], [place]   3. Do they need a 30 day or 90 day supply? [place]

## 2024-03-03 NOTE — Progress Notes (Signed)
 Follow-Up Visit   Subjective  Adam Knight is a 83 y.o. male who presents for the following: AK follow-up. Pt does have some spots at scalp he would like looked at. Patient with hx AK, BCC.  The patient has spots, moles and lesions to be evaluated, some may be new or changing and the patient may have concern these could be cancer.   The following portions of the chart were reviewed this encounter and updated as appropriate: medications, allergies, medical history  Review of Systems:  No other skin or systemic complaints except as noted in HPI or Assessment and Plan.  Objective  Well appearing patient in no apparent distress; mood and affect are within normal limits.   A focused examination was performed of the following areas: Face, scalp   Relevant exam findings are noted in the Assessment and Plan.  L temple x2, R mid ear helix x1, R mid forearm x1, L forearm x2, central upper forehead x2 (8) Pink scaly macules  Assessment & Plan   ACTINIC DAMAGE - chronic, secondary to cumulative UV radiation exposure/sun exposure over time - diffuse scaly erythematous macules with underlying dyspigmentation - Recommend daily broad spectrum sunscreen SPF 30+ to sun-exposed areas, reapply every 2 hours as needed.  - Recommend staying in the shade or wearing long sleeves, sun glasses (UVA+UVB protection) and wide brim hats (4-inch brim around the entire circumference of the hat). - Call for new or changing lesions.  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  - Call if any new or changing lesions are noted between office visits  HISTORY OF AMP (Atypical melanocytic proliferation with regression)- resolved with biopsy only  No evidence of recurrence today R temple Recommend regular full body skin exams Recommend daily broad spectrum sunscreen SPF 30+ to sun-exposed areas, reapply every 2 hours as needed.  Call if any new or changing lesions are noted between office visits   LENTIGINES Exam: scattered tan macules face, scalp, arms Due to sun exposure Treatment Plan: Benign-appearing, observe. Recommend daily broad spectrum sunscreen SPF 30+ to sun-exposed areas, reapply every 2 hours as needed.  Call for any changes   SEBORRHEIC KERATOSIS - Stuck-on, waxy, tan-brown papules  face, scalp, arms - Benign-appearing - Discussed benign etiology and prognosis. - Observe - Call for any changes   Purpura - Chronic; persistent and recurrent.  Treatable, but not curable. 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 Condition is bothersome/symptomatic for patient. Currently flared.   Seborrheic Dermatitis is a chronic persistent rash characterized by pinkness and scaling most commonly of the  mid face but also can occur on the scalp (dandruff), ears; mid chest, mid back and groin.  It tends to be exacerbated by stress and cooler weather.  People who have neurologic disease may experience new onset or exacerbation of existing seborrheic dermatitis.  The condition is not curable but treatable and can be controlled.  Treatment Plan: start fluocinonide (LIDEX) 0.05% solution apply to aa prn itching 1-2 drops, BID morning and night.    start Ketoconazole 2% shampoo, rub into scalp let sit several minutes before rinsing out three times a week. Can alternate with Head and Shoulders shampoo as tolerated.   AK (ACTINIC KERATOSIS) (8) L temple x2, R mid ear helix x1, R mid forearm x1, L forearm x2, central upper forehead x2 (8) Actinic keratoses are precancerous spots that appear secondary to cumulative UV radiation exposure/sun exposure over time. They are chronic with expected duration over 1 year. A portion of actinic keratoses will progress to squamous cell carcinoma of the skin. It is not possible to reliably predict which spots will progress to skin cancer and so treatment is recommended to prevent development of skin cancer.  Recommend daily broad spectrum sunscreen SPF 30+ to sun-exposed areas, reapply every 2 hours as needed.  Recommend staying in the shade or wearing long sleeves, sun glasses (UVA+UVB protection) and wide brim hats (4-inch brim around the entire circumference of the hat). Call for new or changing lesions. Destruction of lesion - L temple x2, R mid ear helix x1, R mid forearm x1, L forearm x2, central upper forehead x2 (8)  Destruction method: cryotherapy   Informed consent: discussed and consent obtained   Lesion destroyed using liquid nitrogen: Yes   Region frozen until ice ball extended beyond lesion: Yes   Outcome: patient tolerated procedure well with no complications   Post-procedure details: wound care instructions given   Additional details:  Prior to  procedure, discussed risks of blister formation, small wound, skin dyspigmentation, or rare scar following cryotherapy. Recommend Vaseline ointment to treated areas while healing.   Return in about 6 months (around 09/03/2024) for w/ Dr. Roseanne Reno, AK follow-up, [facility], Earnest Bailey  I, Soundra Pilon, CMA, am acting as scribe for [name], MD .  Documentation: I have reviewed the above documentation for accuracy and completeness, and I agree with the above.  [name], MD

## 2024-03-03 NOTE — Telephone Encounter (Signed)
 Refill request for warfarin:  Last INR was 2.4 on 02/19/24 Next INR due 04/01/24 LOV 02/20/24    Refill approved.

## 2024-03-03 NOTE — Telephone Encounter (Signed)
 Refill request

## 2024-03-03 NOTE — Patient Instructions (Addendum)

## 2024-04-01 ENCOUNTER — Ambulatory Visit: Payer: Medicare Other | Attending: Internal Medicine

## 2024-04-01 DIAGNOSIS — Z5181 Encounter for therapeutic drug level monitoring: Secondary | ICD-10-CM | POA: Insufficient documentation

## 2024-04-01 DIAGNOSIS — I2602 Saddle embolus of pulmonary artery with acute cor pulmonale: Secondary | ICD-10-CM | POA: Insufficient documentation

## 2024-04-01 LAB — POCT INR: INR: 1.4 — AB (ref 2.0–3.0)

## 2024-04-01 NOTE — Patient Instructions (Signed)
 Take 2 tablets today only then Continue 0.5 tablet daily except 1 tablet on Mondays, Wednesdays and Fridays.   Recheck INR in 4 weeks.  [phone]

## 2024-04-29 ENCOUNTER — Ambulatory Visit: Attending: Internal Medicine

## 2024-04-29 DIAGNOSIS — Z5181 Encounter for therapeutic drug level monitoring: Secondary | ICD-10-CM

## 2024-04-29 DIAGNOSIS — I2602 Saddle embolus of pulmonary artery with acute cor pulmonale: Secondary | ICD-10-CM

## 2024-04-29 LAB — POCT INR: INR: 2.1 (ref 2.0–3.0)

## 2024-04-29 NOTE — Patient Instructions (Signed)
 Continue 0.5 tablet daily except 1 tablet on Mondays, Wednesdays and Fridays.   Recheck INR in 6 weeks.  [phone]

## 2024-05-12 ENCOUNTER — Other Ambulatory Visit (INDEPENDENT_AMBULATORY_CARE_PROVIDER_SITE_OTHER): Payer: Self-pay | Admitting: Nurse Practitioner

## 2024-05-12 DIAGNOSIS — I739 Peripheral vascular disease, unspecified: Secondary | ICD-10-CM

## 2024-05-14 ENCOUNTER — Ambulatory Visit (INDEPENDENT_AMBULATORY_CARE_PROVIDER_SITE_OTHER)

## 2024-05-14 ENCOUNTER — Encounter (INDEPENDENT_AMBULATORY_CARE_PROVIDER_SITE_OTHER): Payer: Self-pay | Admitting: Vascular Surgery

## 2024-05-14 ENCOUNTER — Ambulatory Visit (INDEPENDENT_AMBULATORY_CARE_PROVIDER_SITE_OTHER): Admitting: Vascular Surgery

## 2024-05-14 VITALS — BP 132/85 | HR 92 | Resp 17 | Ht 62.0 in | Wt 216.0 lb

## 2024-05-14 DIAGNOSIS — Z7901 Long term (current) use of anticoagulants: Secondary | ICD-10-CM | POA: Diagnosis not present

## 2024-05-14 DIAGNOSIS — I739 Peripheral vascular disease, unspecified: Secondary | ICD-10-CM

## 2024-05-14 DIAGNOSIS — M25572 Pain in left ankle and joints of left foot: Secondary | ICD-10-CM | POA: Diagnosis not present

## 2024-05-14 NOTE — Progress Notes (Signed)
 Subjective:    Patient ID: Adam Knight, male    DOB: Nov 25, 1941, 83 y.o.   MRN: [id number] Chief Complaint  Patient presents with   Establish Care    Adam Knight is an 83 year old male who presents to clinic today with complaints of left lower extremity/foot and ankle pain.  Patient states he has more pain upon ambulation than when at rest.  He describes the pain as all sorts of types of pain.  Today it throbs and aches more as he walks.  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  He also presents today for review of bilateral lower extremity duplex arterial ultrasounds with ABIs.  Patient states his condition has been the same for the past 10 years but his left ankle and foot pain have worsened over the last couple of months.  He currently sees podiatry for his issues with toenails and feet.  He endorses he has never seen an orthopedic physician or surgeon.  His arthritis is being addressed by his primary care physician.    Review of Systems  Constitutional: Negative.   Musculoskeletal:  Positive for arthralgias and joint swelling.       History of arthritis with joint swelling.  History of gout as well.  Skin:  Positive for color change.       History of vascular insufficiency with color change to his feet and toes.  Neurological:  Positive for numbness.       Intermittent numbness to his left foot and ankle.  All other systems reviewed and are negative.      Objective:    Physical Exam Constitutional:      Appearance: Normal appearance. He is obese.  HENT:     Head: Normocephalic.  Eyes:     Pupils: Pupils are equal, round, and reactive to light.  Cardiovascular:     Rate and Rhythm: Normal rate and regular rhythm.     Pulses: Normal pulses.     Heart sounds: Normal heart sounds.  Pulmonary:     Effort: Pulmonary effort is normal.     Breath sounds: Normal  breath sounds.  Abdominal:     General: Bowel sounds are normal.     Palpations: Abdomen is soft.  Musculoskeletal:        General: Tenderness present.     Comments: Pain to his left ankle and left foot.  History of chronic arthritis with chronic gout.  Positive tenderness on palpation to his left ankle.  Skin:    General: Skin is warm and dry.     Capillary Refill: Capillary refill takes 2 to 3 seconds.  Neurological:     General: No focal deficit present.     Mental Status: He is alert and oriented to person, place, and time. Mental status is at baseline.  Psychiatric:        Mood and Affect: Mood normal.        Behavior: Behavior normal.        Thought Content: Thought content normal.        Judgment: Judgment normal.     BP 132/85 (BP Location: Left Arm, Patient Position: Sitting, Cuff Size: Large)   Pulse 92   Resp 17   Ht 5\' 2"  (1.575 m)   Wt 216 lb (98 kg)   BMI 39.51 kg/m   Past Medical History:  Diagnosis Date  Actinic keratosis    Atypical mole 04/18/2020   R temple Atypical Letiginous melanocytic proliferation with regression, clear post biopsy   Basal cell carcinoma 04/18/2020   Right mid upper forehead, MOHs 06/22/20   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HCC)    Dyslipidemia    Edema    Gastric ulcer    OSA on CPAP    Prediabetes    Pulmonary embolism (HCC)    Seasonal allergies     Social History   Socioeconomic History   Marital status: Married    Spouse name: Not on file   Number of children: Not on file   Years of education: Not on file   Highest education level: Not on file  Occupational History   Not on file  Tobacco Use   Smoking status: Former    Current packs/day: 0.00    Average packs/day: 0.3 packs/day for 7.0 years (2.1 ttl pk-yrs)    Types: Cigarettes    Start date: 38    Quit date: 30    Years since quitting: 58.4   Smokeless tobacco: Never  Vaping Use   Vaping status: Never Used  Substance and  Sexual Activity   Alcohol use: Not Currently   Drug use: No   Sexual activity: Not on file  Other Topics Concern   Not on file  Social History Narrative   Not on file   Social Drivers of Health   Financial Resource Strain: Low Risk  (01/22/2024)   Received from [facility]   Overall Financial Resource Strain (CARDIA)    Difficulty of Paying Living Expenses: Not hard at all  Food Insecurity: No Food Insecurity (01/22/2024)   Received from [facility]   Hunger Vital Sign    Worried About Running Out of Food in the Last Year: Never true    Ran Out of Food in the Last Year: Never true  Transportation Needs: No Transportation Needs (01/22/2024)   Received from [facility] - Transportation    In the past 12 months, has lack of transportation kept you from medical appointments or from getting medications?: No    Lack of Transportation (Non-Medical): No  Physical Activity: Insufficiently Active (11/14/2020)   Received from [facility]   Exercise Vital Sign  Stress: No Stress Concern Present (11/14/2020)   Received from [facility] - Occupational Stress Questionnaire  Social Connections: Socially Isolated (11/14/2020)   Received from [facility]   Social Connection and Isolation Panel [NHANES]  Intimate Partner Violence: Not on file    Past Surgical History:  Procedure Laterality Date   ENDARTERECTOMY  10/2018   Pulmonary endarterectomy for CTEPH - [facility]   PULMONARY VENOGRAPHY Bilateral 01/08/2018   Procedure: PULMONARY VENOGRAPHY possible thrombolysis;  Surgeon: Jackquelyn Mass, MD;  Location: [facility] INVASIVE CV LAB;  Service: Cardiovascular;  Laterality: Bilateral;   RIGHT HEART CATH Right 02/25/2024   Procedure: RIGHT HEART CATH;  Surgeon: Sammy Crisp, MD;  Location: [facility] INVASIVE CV LAB;  Service: Cardiovascular;   Laterality: Right;   TONSILLECTOMY      Family History  Problem Relation Age of Onset   Stroke Mother    Coronary artery disease Mother    Osteoporosis Mother    Rheum arthritis Mother    Thyroid disease Mother     Allergies  Allergen Reactions   Sulfa Antibiotics Itching, Swelling and Other (See Comments)  Spironolactone      Headache   Penicillins Itching and Other (See Comments)    Has patient had a PCN reaction causing immediate rash, facial/tongue/throat swelling, SOB or lightheadedness with hypotension: Yes  Has patient had a PCN reaction causing severe rash involving mucus membranes or skin necrosis: No  Has patient had a PCN reaction that required hospitalization: No  Has patient had a PCN reaction occurring within the last 10 years: No  If all of the above answers are "NO", then may proceed with Cephalosporin use.       Latest Ref Rng & Units 02/25/2024   10:32 AM 02/20/2024   10:10 AM 06/14/2022    8:57 AM  CBC  WBC 3.4 - 10.8 x10E3/uL  6.2  9.6   Hemoglobin 13.0 - 17.0 g/dL 16.1  09.6  04.5   Hematocrit 39.0 - 52.0 % 43.0  48.0  44.2   Platelets 150 - 450 x10E3/uL  432  410        CMP     Component Value Date/Time   NA 139 02/25/2024 1032   NA 144 02/20/2024 1010   K 3.9 02/25/2024 1032   CL 101 02/20/2024 1010   CO2 26 02/20/2024 1010   GLUCOSE 96 02/20/2024 1010   GLUCOSE 140 (H) 06/14/2022 0857   BUN 33 (H) 02/20/2024 1010   CREATININE 1.54 (H) 02/20/2024 1010   CALCIUM 9.3 02/20/2024 1010   PROT 7.3 06/14/2022 0857   ALBUMIN 3.9 06/14/2022 0857   AST 19 06/14/2022 0857   ALT 16 06/14/2022 0857   ALKPHOS 78 06/14/2022 0857   BILITOT 0.7 06/14/2022 0857   EGFR 45 (L) 02/20/2024 1010   GFRNONAA 49 (L) 06/14/2022 0857     No results found.     Assessment & Plan:   1. Pain of joint of left ankle and foot (Primary) Patient presents today for left ankle and foot pain.  Duplex ultrasounds of his arterial system with ABIs were completed.   There is no noted claudication on testing.  Patient's right ABI is 1.13 today.  Patient's left ABI is 1.17 today.  All within normal limits.  All waveforms are triphasic.  Patient has had prior venous ultrasound studies to check for DVTs and those were negative now that he has been fully anticoagulated on Coumadin .  Patient's description of the pain appears to be arthritic.  Patient also endorses he is being treated for gout.  He has a history of arthritis with bilateral knee pain as well.  I recommend that he follow-up with orthopedics to check his left ankle and his left foot pain.  Ambulatory referral will be made today.  - Ambulatory referral to Orthopedics  No follow-up will be made today as he is vascular ulcer sounds for claudication were all normal.  I did express to the patient that if his condition changes and he develops severe leg swelling or what he felt was a DVT to his lower extremity to please call us  would be more than glad to see him again and do duplex venous ultrasounds of his bilateral lower extremities to check for DVTs. Patient agrees at this time.  He does not wish to undergo venous Doppler ultrasounds to check for DVTs he feels that is unnecessary.  He is in agreement with ambulatory referral to orthopedics for his left ankle and left foot pain.   2. Anticoagulated on Coumadin  Continue anticoagulation as prescribed for prior history of pulmonary embolisms and DVTs.  Follow-up with primary care  provider who follows INR checks as scheduled.   Current Outpatient Medications on File Prior to Visit  Medication Sig Dispense Refill   acetaminophen  (TYLENOL ) 500 MG tablet Take 500 mg by mouth every 6 (six) hours as needed for mild pain (pain score 1-3), moderate pain (pain score 4-6) or headache.     Calcium Carbonate Antacid (CALCIUM CARBONATE, DOSED IN MG ELEMENTAL CALCIUM,) 1250 MG/5ML SUSP Take 1,000 mg of elemental calcium by mouth daily.     Cholecalciferol (VITAMIN D) 2000  UNITS tablet Take 2,000 Units by mouth daily.     Cyanocobalamin (VITAMIN B-12) 5000 MCG SUBL Place under the tongue.     fluocinonide  (LIDEX ) 0.05 % external solution Apply 1 Application topically 2 (two) times daily. Apply to aa itching 1-2 drops, BID 60 mL 3   ketoconazole  (NIZORAL ) 2 % shampoo PLEASE SEE ATTACHED FOR DETAILED DIRECTIONS     Lutein 20 MG CAPS Take 20 mg by mouth daily.     potassium chloride (KLOR-CON) 10 MEQ tablet Take 30-40 mEq by mouth See admin instructions. Take 40 MEQ IN THE MORNING AND 30 MEQ AT NIGHT.     TART CHERRY PO Take 1 tablet by mouth daily.     torsemide  (DEMADEX ) 20 MG tablet Take 2 tablets by mouth daily.     warfarin (COUMADIN ) 5 MG tablet TAKE 1/2 TO 1 TABLET BY MOUTH DAILY AS DIRECTED BY THE COUMADIN  CLINIC. 100 tablet 1   busPIRone (BUSPAR) 10 MG tablet Take 10 mg by mouth 2 (two) times daily. (Patient not taking: Reported on 05/14/2024)     torsemide  (DEMADEX ) 20 MG tablet Take 2 tablets (40 mg total) by mouth daily. (Patient not taking: Reported on 05/14/2024) 180 tablet 1   No current facility-administered medications on file prior to visit.    There are no Patient Instructions on file for this visit. No follow-ups on file.   Annamaria Barrette, NP

## 2024-05-19 LAB — VAS US ABI WITH/WO TBI
Left ABI: 1.17
Right ABI: 1.13

## 2024-05-27 ENCOUNTER — Ambulatory Visit (INDEPENDENT_AMBULATORY_CARE_PROVIDER_SITE_OTHER): Admitting: Dermatology

## 2024-05-27 ENCOUNTER — Encounter: Payer: Self-pay | Admitting: Dermatology

## 2024-05-27 DIAGNOSIS — W908XXA Exposure to other nonionizing radiation, initial encounter: Secondary | ICD-10-CM

## 2024-05-27 DIAGNOSIS — L03113 Cellulitis of right upper limb: Secondary | ICD-10-CM

## 2024-05-27 DIAGNOSIS — L255 Unspecified contact dermatitis due to plants, except food: Secondary | ICD-10-CM

## 2024-05-27 DIAGNOSIS — L578 Other skin changes due to chronic exposure to nonionizing radiation: Secondary | ICD-10-CM

## 2024-05-27 DIAGNOSIS — D692 Other nonthrombocytopenic purpura: Secondary | ICD-10-CM | POA: Diagnosis not present

## 2024-05-27 DIAGNOSIS — Z7189 Other specified counseling: Secondary | ICD-10-CM

## 2024-05-27 DIAGNOSIS — Z79899 Other long term (current) drug therapy: Secondary | ICD-10-CM

## 2024-05-27 MED ORDER — DOXYCYCLINE MONOHYDRATE 100 MG PO CAPS: ORAL_CAPSULE | ORAL | 0 refills | Status: AC

## 2024-05-27 MED ORDER — MUPIROCIN 2 % EX OINT: TOPICAL_OINTMENT | CUTANEOUS | 1 refills | Status: AC

## 2024-05-27 NOTE — Progress Notes (Signed)
   Follow Up Visit   Subjective  Adam Knight is a 83 y.o. male who presents for the following: Rash on arms. States he has been working in the yard picking up bushes. Itching. Using Calamine lotion. C/O heat and infection at right forearm. Picked at a lesion and pus drained out. The patient has spots, moles and lesions to be evaluated, some may be new or changing and the patient may have concern these could be cancer.  The following portions of the chart were reviewed this encounter and updated as appropriate: medications, allergies, medical history  Review of Systems:  No other skin or systemic complaints except as noted in HPI or Assessment and Plan.  Objective  Well appearing patient in no apparent distress; mood and affect are within normal limits.  A focused examination was performed of the following areas: B/L arms  Relevant exam findings are noted in the Assessment and Plan.  right dorsal forearm Crust with surrounding erythema         Assessment & Plan   Purpura - Chronic; persistent and recurrent.  Treatable, but not curable. - Violaceous macules and patches at hands and arms - Benign - Related to trauma, age, sun damage and/or use of blood thinners, chronic use of topical and/or oral steroids - Observe - Can use OTC arnica containing moisturizer such as Dermend Bruise Formula if desired - Call for worsening or other concerns  CELLULITIS OF RIGHT UPPER EXTREMITY right dorsal forearm Apply Mupirocin twice daily to scab on right arm 10 days or until healed.   Take Doxycycline  twice daily with food for 10 days  doxycycline  (MONODOX ) 100 MG capsule - right dorsal forearm Take 1 capsule twice daily with food and drink  mupirocin ointment (BACTROBAN) 2 % - right dorsal forearm Apply twice daily to wound on right arm ACTINIC SKIN DAMAGE   PURPURA (HCC)   CONTACT DERMATITIS DUE TO PLANTS, EXCEPT FOOD, UNSPECIFIED CONTACT DERMATITIS TYPE   COUNSELING AND  COORDINATION OF CARE   MEDICATION MANAGEMENT     ALLERGIC vs IRRITANT CONTACT DERMATITIS L arm due to working with plants Exam: scaly pink papules and/or plaques +/- vesiculation at ventral left forearm Treatment Plan: Start Triamcinolone  0.1% cream twice daily to affected areas on arms as needed for itching. Avoid applying to face, groin, and axilla. Use as directed. Long-term use can cause thinning of the skin. (Patient has at home)  Topical steroids (such as triamcinolone , fluocinolone, fluocinonide , mometasone, clobetasol, halobetasol, betamethasone, hydrocortisone) can cause thinning and lightening of the skin if they are used for too long in the same area. Your physician has selected the right strength medicine for your problem and area affected on the body. Please use your medication only as directed by your physician to prevent side effects.   ACTINIC DAMAGE - chronic, secondary to cumulative UV radiation exposure/sun exposure over time - diffuse scaly erythematous macules with underlying dyspigmentation - Recommend daily broad spectrum sunscreen SPF 30+ to sun-exposed areas, reapply every 2 hours as needed.  - Recommend staying in the shade or wearing long sleeves, sun glasses (UVA+UVB protection) and wide brim hats (4-inch brim around the entire circumference of the hat). - Call for new or changing lesions.   Return if symptoms worsen or fail to improve.  I, Jill Parcell, CMA, am acting as scribe for Celine Collard, MD.   Documentation: I have reviewed the above documentation for accuracy and completeness, and I agree with the above.  Celine Collard, MD

## 2024-05-27 NOTE — Patient Instructions (Addendum)
 Use your Triamcinolone  ointment twice daily to rash on left arm.    Apply Mupirocin twice daily to scab on right arm 10 days or until healed.   Take Doxycycline  twice daily with food for 10 days    Doxycycline  should be taken with food to prevent nausea. Do not lay down for 30 minutes after taking. Be cautious with sun exposure and use good sun protection while on this medication. Pregnant women should not take this medication.     Recommend daily broad spectrum sunscreen SPF 30+ to sun-exposed areas, reapply every 2 hours as needed. Call for new or changing lesions.  Staying in the shade or wearing long sleeves, sun glasses (UVA+UVB protection) and wide brim hats (4-inch brim around the entire circumference of the hat) are also recommended for sun protection.      Due to recent changes in healthcare laws, you may see results of your pathology and/or laboratory studies on MyChart before the doctors have had a chance to review them. We understand that in some cases there may be results that are confusing or concerning to you. Please understand that not all results are received at the same time and often the doctors may need to interpret multiple results in order to provide you with the best plan of care or course of treatment. Therefore, we ask that you please give us  2 business days to thoroughly review all your results before contacting the office for clarification. Should we see a critical lab result, you will be contacted sooner.   If You Need Anything After Your Visit  If you have any questions or concerns for your doctor, please call our main line at [phone] and press option 4 to reach your doctor's medical assistant. If no one answers, please leave a voicemail as directed and we will return your call as soon as possible. Messages left after 4 pm will be answered the following business day.   You may also send us  a message via MyChart. We typically respond to MyChart messages  within 1-2 business days.  For prescription refills, please ask your pharmacy to contact our office. Our fax number is [phone].  If you have an urgent issue when the clinic is closed that cannot wait until the next business day, you can page your doctor at the number below.    Please note that while we do our best to be available for urgent issues outside of office hours, we are not available 24/7.   If you have an urgent issue and are unable to reach us , you may choose to seek medical care at your doctor's office, retail clinic, urgent care center, or emergency room.  If you have a medical emergency, please immediately call 911 or go to the emergency department.  Pager Numbers  - Dr. Bary Likes: [phone]  - Dr. Annette Barters: [phone]  - Dr. Felipe Horton: [phone]   In the event of inclement weather, please call our main line at [phone] for an update on the status of any delays or closures.  Dermatology Medication Tips: Please keep the boxes that topical medications come in in order to help keep track of the instructions about where and how to use these. Pharmacies typically print the medication instructions only on the boxes and not directly on the medication tubes.   If your medication is too expensive, please contact our office at [phone] option 4 or send us  a message through MyChart.   We are unable to tell what your co-pay for medications  will be in advance as this is different depending on your insurance coverage. However, we may be able to find a substitute medication at lower cost or fill out paperwork to get insurance to cover a needed medication.   If a prior authorization is required to get your medication covered by your insurance company, please allow us  1-2 business days to complete this process.  Drug prices often vary depending on where the prescription is filled and some pharmacies may offer cheaper prices.  The website www.goodrx.com contains coupons  for medications through different pharmacies. The prices here do not account for what the cost may be with help from insurance (it may be cheaper with your insurance), but the website can give you the price if you did not use any insurance.  - You can print the associated coupon and take it with your prescription to the pharmacy.  - You may also stop by our office during regular business hours and pick up a GoodRx coupon card.  - If you need your prescription sent electronically to a different pharmacy, notify our office through [facility] or by phone at [phone] option 4.     Si Usted Necesita Algo Despus de Su Visita  Tambin puede enviarnos un mensaje a travs de Clinical cytogeneticist. Por lo general respondemos a los mensajes de MyChart en el transcurso de 1 a 2 das hbiles.  Para renovar recetas, por favor pida a su farmacia que se ponga en contacto con nuestra oficina. Franz Jacks de fax es [place] [phone].  Si tiene un asunto urgente cuando la clnica est cerrada y que no puede esperar hasta el siguiente da hbil, puede llamar/localizar a su doctor(a) al nmero que aparece a continuacin.   Por favor, tenga en cuenta que aunque hacemos todo lo posible para estar disponibles para asuntos urgentes fuera del horario de [place], no estamos disponibles las 24 horas del da, los 7 [address]  [address] de la [place].   Si tiene un problema urgente y no puede comunicarse con nosotros, puede optar por buscar atencin mdica  en el consultorio de su doctor(a), en una clnica privada, en un centro de atencin urgente o en una sala de emergencias.  Si tiene Engineer, drilling, por favor llame inmediatamente al 911 o vaya a la sala de emergencias.  Nmeros de bper  - Dr. Bary Likes: [phone]  - Dra. Annette Barters: [phone]  - Dr. Felipe Horton: [phone]   En caso de inclemencias del tiempo, por favor llame a [place] P[place] principal al [phone] para una actualizacin sobre el Waucoma de cualquier  retraso o cierre.  Consejos para la medicacin en dermatologa: Por favor, guarde las cajas en las que vienen los medicamentos de uso tpico para ayudarle a seguir las instrucciones sobre dnde y cmo usarlos. Las farmacias generalmente imprimen las instrucciones del medicamento slo en las cajas y no directamente en los tubos del [place].   Si su medicamento es muy caro, por favor, pngase en contacto con Bettyjane Brunet llamando al [phone] y presione la opcin 4 o envenos un mensaje a travs de Clinical cytogeneticist.   No podemos decirle cul ser su copago por los medicamentos por adelantado ya que esto es diferente dependiendo de la cobertura de su seguro. Sin embargo, es posible que podamos encontrar un medicamento sustituto a Audiological scientist un formulario para que el seguro cubra el medicamento que se considera necesario.   Si se requiere una autorizacin previa para que su compaa de seguros [place] su medicamento, por favor permtanos  de 1 a 2 das hbiles para completar [address]  Los precios de los medicamentos varan con frecuencia dependiendo del Environmental consultant de dnde se surte la receta y alguna farmacias pueden ofrecer precios ms baratos.  El sitio web www.goodrx.com tiene cupones para medicamentos de Health and safety inspector. Los precios aqu no tienen en cuenta lo que podra costar con la ayuda del seguro (puede ser ms barato con su seguro), pero el sitio web puede darle el precio si no utiliz Tourist information centre manager.  - Puede imprimir el cupn correspondiente y llevarlo con su receta a la farmacia.  - Tambin puede pasar por nuestra oficina durante el horario de atencin regular y Education officer, museum una tarjeta de cupones de GoodRx.  - Si necesita que su receta se enve electrnicamente a una farmacia diferente, informe a nuestra oficina a travs de MyChart de [place] o por telfono llamando al [phone] y presione la opcin 4.

## 2024-06-01 ENCOUNTER — Ambulatory Visit (INDEPENDENT_AMBULATORY_CARE_PROVIDER_SITE_OTHER): Admitting: Dermatology

## 2024-06-01 ENCOUNTER — Encounter: Payer: Self-pay | Admitting: Dermatology

## 2024-06-01 DIAGNOSIS — D692 Other nonthrombocytopenic purpura: Secondary | ICD-10-CM

## 2024-06-01 DIAGNOSIS — Z7189 Other specified counseling: Secondary | ICD-10-CM | POA: Diagnosis not present

## 2024-06-01 DIAGNOSIS — L237 Allergic contact dermatitis due to plants, except food: Secondary | ICD-10-CM | POA: Diagnosis not present

## 2024-06-01 DIAGNOSIS — Z79899 Other long term (current) drug therapy: Secondary | ICD-10-CM

## 2024-06-01 DIAGNOSIS — L03113 Cellulitis of right upper limb: Secondary | ICD-10-CM | POA: Diagnosis not present

## 2024-06-01 DIAGNOSIS — R21 Rash and other nonspecific skin eruption: Secondary | ICD-10-CM

## 2024-06-01 MED ORDER — CLOBETASOL PROPIONATE 0.05 % EX CREA: TOPICAL_CREAM | CUTANEOUS | 1 refills | Status: AC

## 2024-06-01 NOTE — Patient Instructions (Addendum)
 Start Clobetasol cream twice a day to rash for 2-3 weeks. Avoid applying to face, groin, and axilla. Use as directed. Long-term use can cause thinning of the skin.  Topical steroids (such as triamcinolone , fluocinolone, fluocinonide , mometasone, clobetasol, halobetasol, betamethasone, hydrocortisone) can cause thinning and lightening of the skin if they are used for too long in the same area. Your physician has selected the right strength medicine for your problem and area affected on the body. Please use your medication only as directed by your physician to prevent side effects.   Finish Doxycycline  as directed. Continue Mupirocin  as directed.    Due to recent changes in healthcare laws, you may see results of your pathology and/or laboratory studies on MyChart before the doctors have had a chance to review them. We understand that in some cases there may be results that are confusing or concerning to you. Please understand that not all results are received at the same time and often the doctors may need to interpret multiple results in order to provide you with the best plan of care or course of treatment. Therefore, we ask that you please give us  2 business days to thoroughly review all your results before contacting the office for clarification. Should we see a critical lab result, you will be contacted sooner.   If You Need Anything After Your Visit  If you have any questions or concerns for your doctor, please call our main line at [phone] and press option 4 to reach your doctor's medical assistant. If no one answers, please leave a voicemail as directed and we will return your call as soon as possible. Messages left after 4 pm will be answered the following business day.   You may also send us  a message via MyChart. We typically respond to MyChart messages within 1-2 business days.  For prescription refills, please ask your pharmacy to contact our office. Our fax number is  [phone].  If you have an urgent issue when the clinic is closed that cannot wait until the next business day, you can page your doctor at the number below.    Please note that while we do our best to be available for urgent issues outside of office hours, we are not available 24/7.   If you have an urgent issue and are unable to reach us , you may choose to seek medical care at your doctor's office, retail clinic, urgent care center, or emergency room.  If you have a medical emergency, please immediately call 911 or go to the emergency department.  Pager Numbers  - Dr. Bary Likes: [phone]  - Dr. Annette Barters: [phone]  - Dr. Felipe Horton: [phone]   In the event of inclement weather, please call our main line at [phone] for an update on the status of any delays or closures.  Dermatology Medication Tips: Please keep the boxes that topical medications come in in order to help keep track of the instructions about where and how to use these. Pharmacies typically print the medication instructions only on the boxes and not directly on the medication tubes.   If your medication is too expensive, please contact our office at [phone] option 4 or send us  a message through MyChart.   We are unable to tell what your co-pay for medications will be in advance as this is different depending on your insurance coverage. However, we may be able to find a substitute medication at lower cost or fill out paperwork to get insurance to cover a needed medication.  If a prior authorization is required to get your medication covered by your insurance company, please allow us  1-2 business days to complete this process.  Drug prices often vary depending on where the prescription is filled and some pharmacies may offer cheaper prices.  The website www.goodrx.com contains coupons for medications through different pharmacies. The prices here do not account for what the cost may be with help from  insurance (it may be cheaper with your insurance), but the website can give you the price if you did not use any insurance.  - You can print the associated coupon and take it with your prescription to the pharmacy.  - You may also stop by our office during regular business hours and pick up a GoodRx coupon card.  - If you need your prescription sent electronically to a different pharmacy, notify our office through [facility] or by phone at [phone] option 4.     Si Usted Necesita Algo Despus de Su Visita  Tambin puede enviarnos un mensaje a travs de Clinical cytogeneticist. Por lo general respondemos a los mensajes de MyChart en el transcurso de 1 a 2 das hbiles.  Para renovar recetas, por favor pida a su farmacia que se ponga en contacto con nuestra oficina. Franz Jacks de fax es [place] [phone].  Si tiene un asunto urgente cuando la clnica est cerrada y que no puede esperar hasta el siguiente da hbil, puede llamar/localizar a su doctor(a) al nmero que aparece a continuacin.   Por favor, tenga en cuenta que aunque hacemos todo lo posible para estar disponibles para asuntos urgentes fuera del horario de [place], no estamos disponibles las 24 horas del da, los 7 [address]  [address] de la [place].   Si tiene un problema urgente y no puede comunicarse con nosotros, puede optar por buscar atencin mdica  en el consultorio de su doctor(a), en una clnica privada, en un centro de atencin urgente o en una sala de emergencias.  Si tiene Engineer, drilling, por favor llame inmediatamente al 911 o vaya a la sala de emergencias.  Nmeros de bper  - Dr. Bary Likes: [phone]  - Dra. Annette Barters: [phone]  - Dr. Felipe Horton: [phone]   En caso de inclemencias del tiempo, por favor llame a Lajuan Pila principal al [phone] para una actualizacin sobre el [place] de cualquier retraso o cierre.  Consejos para la medicacin en dermatologa: Por favor, guarde las cajas en las que vienen los  medicamentos de uso tpico para ayudarle a seguir las instrucciones sobre dnde y cmo usarlos. Las farmacias generalmente imprimen las instrucciones del medicamento slo en las cajas y no directamente en los tubos del [place].   Si su medicamento es muy caro, por favor, pngase en contacto con Bettyjane Brunet llamando al [phone] y presione la opcin 4 o envenos un mensaje a travs de Clinical cytogeneticist.   No podemos decirle cul ser su copago por los medicamentos por adelantado ya que esto es diferente dependiendo de la cobertura de su seguro. Sin embargo, es posible que podamos encontrar un medicamento sustituto a Audiological scientist un formulario para que el seguro cubra el medicamento que se considera necesario.   Si se requiere una autorizacin previa para que su compaa de seguros [place] su medicamento, por favor permtanos de 1 a 2 das hbiles para completar este proceso.  Los precios de los medicamentos varan con frecuencia dependiendo del Environmental consultant de dnde se surte la receta y alguna farmacias pueden ofrecer precios ms baratos.  El sitio web  www.goodrx.com tiene cupones para medicamentos de Health and safety inspector. Los precios aqu no tienen en cuenta lo que podra costar con la ayuda del seguro (puede ser ms barato con su seguro), pero el sitio web puede darle el precio si no utiliz Tourist information centre manager.  - Puede imprimir el cupn correspondiente y llevarlo con su receta a la farmacia.  - Tambin puede pasar por nuestra oficina durante el horario de atencin regular y Education officer, museum una tarjeta de cupones de GoodRx.  - Si necesita que su receta se enve electrnicamente a una farmacia diferente, informe a nuestra oficina a travs de MyChart de [place] o por telfono llamando al [phone] y presione la opcin 4.

## 2024-06-01 NOTE — Progress Notes (Signed)
   Follow-Up Visit   Subjective  Adam Knight is a 83 y.o. male who presents for the following: Spot on abdomen. Thinks is related to rash on left forearm. Has been using Triamcinolone  0.1% cream and it is not helping. Expired 03/2024  The patient has spots, moles and lesions to be evaluated, some may be new or changing and the patient may have concern these could be cancer.  The following portions of the chart were reviewed this encounter and updated as appropriate: medications, allergies, medical history  Review of Systems:  No other skin or systemic complaints except as noted in HPI or Assessment and Plan.  Objective  Well appearing patient in no apparent distress; mood and affect are within normal limits.  A focused examination was performed of the following areas: Arms   Relevant physical exam findings are noted in the Assessment and Plan.        Assessment & Plan   ALLERGIC CONTACT DERMATITIS, secondary to working with plants Exam: pink edematous plaque at ventral left arm and left abdomen. Bruising surrounding areas.   Chronic and persistent condition with duration or expected duration over one year. Condition is bothersome/symptomatic for patient. Currently flared.  Treatment Plan: Start Clobetasol cream twice a day to rash for 2-3 weeks. Avoid applying to face, groin, and axilla. Use as directed. Long-term use can cause thinning of the skin.  Topical steroids (such as triamcinolone , fluocinolone, fluocinonide , mometasone, clobetasol, halobetasol, betamethasone, hydrocortisone) can cause thinning and lightening of the skin if they are used for too long in the same area. Your physician has selected the right strength medicine for your problem and area affected on the body. Please use your medication only as directed by your physician to prevent side effects.    Purpura - Chronic; persistent and recurrent.  Treatable, but not curable. - Violaceous macules and  patches - Benign - Related to trauma, age, sun damage and/or use of blood thinners, chronic use of topical and/or oral steroids - Observe - Can use OTC arnica containing moisturizer such as Dermend Bruise Formula if desired - Call for worsening or other concerns  Cellulitis of right upper extremity, resolving.  Exam: mild erythema at right dorsal forearm. Chronic and persistent condition with duration or expected duration over one year. Condition is improving with treatment but not currently at goal. Treatment:  Finish Doxycycline  as directed. Continue Mupirocin  as directed.    Return for UBSE As Scheduled, With Dr. Annette Barters, [place] Follow Up.  I, Darcie Easterly, CMA, am acting as scribe for Celine Collard, MD.   Documentation: I have reviewed the above documentation for accuracy and completeness, and I agree with the above.  Celine Collard, MD

## 2024-06-10 ENCOUNTER — Ambulatory Visit: Attending: Internal Medicine

## 2024-06-10 DIAGNOSIS — Z5181 Encounter for therapeutic drug level monitoring: Secondary | ICD-10-CM | POA: Insufficient documentation

## 2024-06-10 DIAGNOSIS — I2602 Saddle embolus of pulmonary artery with acute cor pulmonale: Secondary | ICD-10-CM | POA: Diagnosis present

## 2024-06-10 LAB — POCT INR: INR: 3 (ref 2.0–3.0)

## 2024-06-10 NOTE — Patient Instructions (Signed)
 Continue 0.5 tablet daily except 1 tablet on Mondays, Wednesdays and Fridays.   Recheck INR in 6 weeks.  [phone]

## 2024-07-22 ENCOUNTER — Ambulatory Visit

## 2024-07-29 ENCOUNTER — Ambulatory Visit: Attending: Internal Medicine

## 2024-07-29 DIAGNOSIS — I2602 Saddle embolus of pulmonary artery with acute cor pulmonale: Secondary | ICD-10-CM | POA: Diagnosis present

## 2024-07-29 DIAGNOSIS — Z5181 Encounter for therapeutic drug level monitoring: Secondary | ICD-10-CM | POA: Diagnosis present

## 2024-07-29 LAB — POCT INR: INR: 1.7 — AB (ref 2.0–3.0)

## 2024-07-29 NOTE — Patient Instructions (Signed)
 Take 2 tablets today only then Continue 0.5 tablet daily except 1 tablet on Mondays, Wednesdays and Fridays.   Recheck INR in 6 weeks.  [phone]

## 2024-09-07 ENCOUNTER — Ambulatory Visit: Admitting: Dermatology

## 2024-09-07 DIAGNOSIS — L578 Other skin changes due to chronic exposure to nonionizing radiation: Secondary | ICD-10-CM

## 2024-09-07 DIAGNOSIS — Z872 Personal history of diseases of the skin and subcutaneous tissue: Secondary | ICD-10-CM

## 2024-09-07 DIAGNOSIS — Z1283 Encounter for screening for malignant neoplasm of skin: Secondary | ICD-10-CM | POA: Diagnosis not present

## 2024-09-07 DIAGNOSIS — L814 Other melanin hyperpigmentation: Secondary | ICD-10-CM | POA: Diagnosis not present

## 2024-09-07 DIAGNOSIS — D692 Other nonthrombocytopenic purpura: Secondary | ICD-10-CM

## 2024-09-07 DIAGNOSIS — D229 Melanocytic nevi, unspecified: Secondary | ICD-10-CM

## 2024-09-07 DIAGNOSIS — D1801 Hemangioma of skin and subcutaneous tissue: Secondary | ICD-10-CM

## 2024-09-07 DIAGNOSIS — L570 Actinic keratosis: Secondary | ICD-10-CM | POA: Diagnosis not present

## 2024-09-07 DIAGNOSIS — Z85828 Personal history of other malignant neoplasm of skin: Secondary | ICD-10-CM

## 2024-09-07 DIAGNOSIS — W908XXA Exposure to other nonionizing radiation, initial encounter: Secondary | ICD-10-CM | POA: Diagnosis not present

## 2024-09-07 DIAGNOSIS — L821 Other seborrheic keratosis: Secondary | ICD-10-CM

## 2024-09-07 DIAGNOSIS — L219 Seborrheic dermatitis, unspecified: Secondary | ICD-10-CM

## 2024-09-07 NOTE — Patient Instructions (Addendum)

## 2024-09-07 NOTE — Progress Notes (Signed)
 Follow-Up Visit   Subjective  Adam Knight is a 83 y.o. male who presents for the following: Skin Cancer Screening and Upper Body Skin Exam  The patient presents for Upper Body Skin Exam (UBSE) for skin cancer screening and mole check. The patient has spots, moles and lesions to be evaluated, some may be new or changing. He has a spot above his right eye to check, not bothersome. History of Aks and BCC.    The following portions of the chart were reviewed this encounter and updated as appropriate: medications, allergies, medical history  Review of Systems:  No other skin or systemic complaints except as noted in HPI or Assessment and Plan.  Objective  Well appearing patient in no apparent distress; mood and affect are within normal limits.  All skin waist up examined. Relevant physical exam findings are noted in the Assessment and Plan.  R frontal scalp x 1, mid forehead x 4, R cheek x 2 (7) Pink scaly macules. L forearm x 3 (3) Keratotic papules  Assessment & Plan   AK (ACTINIC KERATOSIS) (7) R frontal scalp x 1, mid forehead x 4, R cheek x 2 (7) Vs ISKs.  Actinic keratoses are precancerous spots that appear secondary to cumulative UV radiation exposure/sun exposure over time. They are chronic with expected duration over 1 year. A portion of actinic keratoses will progress to squamous cell carcinoma of the skin. It is not possible to reliably predict which spots will progress to skin cancer and so treatment is recommended to prevent development of skin cancer.  Recommend daily broad spectrum sunscreen SPF 30+ to sun-exposed areas, reapply every 2 hours as needed.  Recommend staying in the shade or wearing long sleeves, sun glasses (UVA+UVB protection) and wide brim hats (4-inch brim around the entire circumference of the hat). Call for new or changing lesions. Destruction of lesion - R frontal scalp x 1, mid forehead x 4, R cheek x 2 (7)  Destruction method: cryotherapy    Informed consent: discussed and consent obtained   Lesion destroyed using liquid nitrogen: Yes   Region frozen until ice ball extended beyond lesion: Yes   Outcome: patient tolerated procedure well with no complications   Post-procedure details: wound care instructions given   Additional details:  Prior to procedure, discussed risks of blister formation, small wound, skin dyspigmentation, or rare scar following cryotherapy. Recommend Vaseline ointment to treated areas while healing.   HYPERTROPHIC ACTINIC KERATOSIS (3) L forearm x 3 (3) Actinic keratoses are precancerous spots that appear secondary to cumulative UV radiation exposure/sun exposure over time. They are chronic with expected duration over 1 year. A portion of actinic keratoses will progress to squamous cell carcinoma of the skin. It is not possible to reliably predict which spots will progress to skin cancer and so treatment is recommended to prevent development of skin cancer.  Recommend daily broad spectrum sunscreen SPF 30+ to sun-exposed areas, reapply every 2 hours as needed.  Recommend staying in the shade or wearing long sleeves, sun glasses (UVA+UVB protection) and wide brim hats (4-inch brim around the entire circumference of the hat). Call for new or changing lesions. Destruction of lesion - L forearm x 3 (3)  Destruction method: cryotherapy   Informed consent: discussed and consent obtained   Lesion destroyed using liquid nitrogen: Yes   Region frozen until ice ball extended beyond lesion: Yes   Outcome: patient tolerated procedure well with no complications   Post-procedure details: wound care instructions given  Additional details:  Prior to procedure, discussed risks of blister formation, small wound, skin dyspigmentation, or rare scar following cryotherapy. Recommend Vaseline ointment to treated areas while healing.   Skin cancer screening performed today.  Actinic Damage - Chronic condition, secondary to  cumulative UV/sun exposure - diffuse scaly erythematous macules with underlying dyspigmentation - Recommend daily broad spectrum sunscreen SPF 30+ to sun-exposed areas, reapply every 2 hours as needed.  - Staying in the shade or wearing long sleeves, sun glasses (UVA+UVB protection) and wide brim hats (4-inch brim around the entire circumference of the hat) are also recommended for sun protection.  - Call for new or changing lesions.  Lentigines, Seborrheic Keratoses (including right lateral eyebrow), Hemangiomas - Benign normal skin lesions - Benign-appearing - Call for any changes  Melanocytic Nevi - Tan-brown and/or pink-flesh-colored symmetric macules and papules - Benign appearing on exam today - Observation - Call clinic for new or changing moles - Recommend daily use of broad spectrum spf 30+ sunscreen to sun-exposed areas.   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  Call if any new or changing lesions are noted between office visits   Purpura - Chronic; persistent and recurrent.  Treatable, but not curable. - Violaceous macules and patches - Benign - Related to trauma, age, sun damage and/or use of blood thinners, chronic use of topical and/or oral steroids - Observe - Can use OTC arnica containing moisturizer such as Dermend Bruise Formula if desired - Call for worsening or other concerns  SEBORRHEIC  DERMATITIS Exam: Scalp clear.  Chronic condition with duration or expected duration over one year. Currently well-controlled.  Seborrheic Dermatitis is a chronic persistent rash characterized by pinkness and scaling most commonly of the mid face but also can occur on the scalp (dandruff), ears; mid chest, mid back and groin.  It tends to be exacerbated by stress and cooler weather.  People who have neurologic disease may experience new onset or exacerbation of existing seborrheic dermatitis.  The condition is not curable but treatable and can be controlled.  Treatment Plan: Continue fluocinonide  (LIDEX ) 0.05% solution apply to aa prn itching 1-2 drops, BID morning and night.     Continue Ketoconazole  2% shampoo, rub into scalp let sit several minutes before rinsing out three times a week. Can alternate with Head and Shoulders shampoo as tolerated.    Start 1% hydrocortisone cream to eyebrows as needed for scaling.  Return in about 6 months (around 03/07/2025) for UBSE, Hx BCC.  IAndrea Kerns, CMA, am acting as scribe for Rexene Rattler, MD .   Documentation: I have reviewed the above documentation for accuracy and completeness, and I agree with the above.  Rexene Rattler, MD

## 2024-09-09 ENCOUNTER — Ambulatory Visit: Attending: Internal Medicine

## 2024-09-09 DIAGNOSIS — Z5181 Encounter for therapeutic drug level monitoring: Secondary | ICD-10-CM | POA: Diagnosis present

## 2024-09-09 DIAGNOSIS — I2602 Saddle embolus of pulmonary artery with acute cor pulmonale: Secondary | ICD-10-CM | POA: Diagnosis present

## 2024-09-09 LAB — POCT INR: INR: 1.6 — AB (ref 2.0–3.0)

## 2024-09-09 NOTE — Patient Instructions (Signed)
 Take 2 tablets today only then Continue 0.5 tablet daily except 1 tablet on Mondays, Wednesdays and Fridays.   Recheck INR in 6 weeks.  [phone]

## 2024-10-03 ENCOUNTER — Other Ambulatory Visit: Payer: Self-pay | Admitting: Internal Medicine

## 2024-10-05 NOTE — Telephone Encounter (Signed)
 Warfarin 5mg  refill PE Last INR 09/09/24 Last OV 02/20/24

## 2024-10-21 ENCOUNTER — Ambulatory Visit: Attending: Internal Medicine

## 2024-10-21 DIAGNOSIS — I2602 Saddle embolus of pulmonary artery with acute cor pulmonale: Secondary | ICD-10-CM | POA: Insufficient documentation

## 2024-10-21 DIAGNOSIS — Z5181 Encounter for therapeutic drug level monitoring: Secondary | ICD-10-CM | POA: Insufficient documentation

## 2024-10-21 LAB — POCT INR: INR: 1.7 — AB (ref 2.0–3.0)

## 2024-10-21 NOTE — Patient Instructions (Signed)
 Take 2 tablets today only then Continue 0.5 tablet daily except 1 tablet on Mondays, Wednesdays and Fridays.   Recheck INR in 6 weeks.  [phone]

## 2024-12-02 ENCOUNTER — Ambulatory Visit: Attending: Internal Medicine

## 2024-12-02 DIAGNOSIS — Z5181 Encounter for therapeutic drug level monitoring: Secondary | ICD-10-CM | POA: Diagnosis present

## 2024-12-02 DIAGNOSIS — I2602 Saddle embolus of pulmonary artery with acute cor pulmonale: Secondary | ICD-10-CM | POA: Diagnosis present

## 2024-12-02 LAB — POCT INR: INR: 2.7 (ref 2.0–3.0)

## 2024-12-02 NOTE — Patient Instructions (Signed)
 Continue 0.5 tablet daily except 1 tablet on Mondays, Wednesdays and Fridays.   Recheck INR in 6 weeks.  [phone]

## 2025-01-13 ENCOUNTER — Ambulatory Visit: Attending: Internal Medicine

## 2025-01-13 DIAGNOSIS — Z5181 Encounter for therapeutic drug level monitoring: Secondary | ICD-10-CM | POA: Diagnosis present

## 2025-01-13 DIAGNOSIS — I2602 Saddle embolus of pulmonary artery with acute cor pulmonale: Secondary | ICD-10-CM | POA: Insufficient documentation

## 2025-01-13 LAB — POCT INR: INR: 2.9 (ref 2.0–3.0)

## 2025-01-13 NOTE — Patient Instructions (Signed)
 Continue 0.5 tablet daily except 1 tablet on Mondays, Wednesdays and Fridays.   Recheck INR in 6 weeks.  [phone]

## 2025-02-24 ENCOUNTER — Ambulatory Visit

## 2025-03-03 ENCOUNTER — Ambulatory Visit

## 2025-03-08 ENCOUNTER — Ambulatory Visit: Admitting: Dermatology
